# Patient Record
Sex: Female | Born: 1935 | Race: White | Hispanic: No | Smoking: Former smoker
Health system: Southern US, Community
[De-identification: ages and names within clinical notes are randomized; demographics above are authoritative.]

## PROBLEM LIST (undated history)

## (undated) DIAGNOSIS — I10 Essential (primary) hypertension: Secondary | ICD-10-CM

## (undated) DIAGNOSIS — E785 Hyperlipidemia, unspecified: Secondary | ICD-10-CM

## (undated) HISTORY — PX: BACK SURGERY: SHX140

## (undated) HISTORY — PX: COLON SURGERY: SHX602

---

## 2014-10-13 ENCOUNTER — Encounter: Payer: Self-pay | Admitting: *Deleted

## 2014-10-13 ENCOUNTER — Emergency Department: Payer: Medicare Other

## 2014-10-13 ENCOUNTER — Inpatient Hospital Stay: Payer: Medicare Other

## 2014-10-13 ENCOUNTER — Inpatient Hospital Stay
Admission: EM | Admit: 2014-10-13 | Discharge: 2014-10-15 | DRG: 419 | Disposition: A | Discharge status: Home / Self Care | Payer: Medicare Other | Attending: Surgery | Admitting: Surgery

## 2014-10-13 DIAGNOSIS — K8020 Calculus of gallbladder without cholecystitis without obstruction: Principal | ICD-10-CM | POA: Diagnosis present

## 2014-10-13 DIAGNOSIS — Z9889 Other specified postprocedural states: Secondary | ICD-10-CM | POA: Diagnosis not present

## 2014-10-13 DIAGNOSIS — E785 Hyperlipidemia, unspecified: Secondary | ICD-10-CM | POA: Diagnosis present

## 2014-10-13 DIAGNOSIS — K439 Ventral hernia without obstruction or gangrene: Secondary | ICD-10-CM | POA: Diagnosis present

## 2014-10-13 DIAGNOSIS — R1010 Upper abdominal pain, unspecified: Secondary | ICD-10-CM

## 2014-10-13 DIAGNOSIS — Z808 Family history of malignant neoplasm of other organs or systems: Secondary | ICD-10-CM | POA: Diagnosis not present

## 2014-10-13 DIAGNOSIS — R112 Nausea with vomiting, unspecified: Secondary | ICD-10-CM

## 2014-10-13 DIAGNOSIS — Z820 Family history of epilepsy and other diseases of the nervous system: Secondary | ICD-10-CM | POA: Diagnosis not present

## 2014-10-13 DIAGNOSIS — Z87891 Personal history of nicotine dependence: Secondary | ICD-10-CM

## 2014-10-13 DIAGNOSIS — K8063 Calculus of gallbladder and bile duct with acute cholecystitis with obstruction: Secondary | ICD-10-CM

## 2014-10-13 DIAGNOSIS — R52 Pain, unspecified: Secondary | ICD-10-CM

## 2014-10-13 DIAGNOSIS — I10 Essential (primary) hypertension: Secondary | ICD-10-CM | POA: Diagnosis present

## 2014-10-13 DIAGNOSIS — K8040 Calculus of bile duct with cholecystitis, unspecified, without obstruction: Secondary | ICD-10-CM

## 2014-10-13 HISTORY — DX: Essential (primary) hypertension: I10

## 2014-10-13 LAB — CBC WITH DIFFERENTIAL/PLATELET
BASOS ABS: 0 10*3/uL (ref 0–0.1)
Basophils Relative: 0 %
EOS PCT: 3 %
Eosinophils Absolute: 0.3 10*3/uL (ref 0–0.7)
HCT: 47.5 % — ABNORMAL HIGH (ref 35.0–47.0)
Hemoglobin: 15.8 g/dL (ref 12.0–16.0)
LYMPHS ABS: 1.8 10*3/uL (ref 1.0–3.6)
LYMPHS PCT: 15 %
MCH: 31 pg (ref 26.0–34.0)
MCHC: 33.3 g/dL (ref 32.0–36.0)
MCV: 92.9 fL (ref 80.0–100.0)
Monocytes Absolute: 0.8 10*3/uL (ref 0.2–0.9)
Monocytes Relative: 7 %
NEUTROS ABS: 9.2 10*3/uL — AB (ref 1.4–6.5)
Neutrophils Relative %: 75 %
PLATELETS: 194 10*3/uL (ref 150–440)
RBC: 5.11 MIL/uL (ref 3.80–5.20)
RDW: 13 % (ref 11.5–14.5)
WBC: 12.1 10*3/uL — AB (ref 3.6–11.0)

## 2014-10-13 LAB — URINALYSIS COMPLETE WITH MICROSCOPIC (ARMC ONLY)
BILIRUBIN URINE: NEGATIVE
GLUCOSE, UA: NEGATIVE mg/dL
Hgb urine dipstick: NEGATIVE
KETONES UR: NEGATIVE mg/dL
Nitrite: NEGATIVE
Protein, ur: NEGATIVE mg/dL
SPECIFIC GRAVITY, URINE: 1.017 (ref 1.005–1.030)
pH: 6 (ref 5.0–8.0)

## 2014-10-13 LAB — COMPREHENSIVE METABOLIC PANEL
ALT: 12 U/L — AB (ref 14–54)
AST: 25 U/L (ref 15–41)
Albumin: 3.8 g/dL (ref 3.5–5.0)
Alkaline Phosphatase: 70 U/L (ref 38–126)
Anion gap: 12 (ref 5–15)
BUN: 24 mg/dL — ABNORMAL HIGH (ref 6–20)
CO2: 27 mmol/L (ref 22–32)
Calcium: 9.6 mg/dL (ref 8.9–10.3)
Chloride: 100 mmol/L — ABNORMAL LOW (ref 101–111)
Creatinine, Ser: 1.01 mg/dL — ABNORMAL HIGH (ref 0.44–1.00)
GFR calc Af Amer: >60 mL/min (ref >60)
GFR calc non Af Amer: 52 mL/min — ABNORMAL LOW (ref >60)
Glucose, Bld: 119 mg/dL — ABNORMAL HIGH (ref 65–99)
Potassium: 4.3 mmol/L (ref 3.5–5.1)
Sodium: 139 mmol/L (ref 135–145)
Total Bilirubin: 0.8 mg/dL (ref 0.3–1.2)
Total Protein: 8 g/dL (ref 6.5–8.1)

## 2014-10-13 LAB — TROPONIN I: Troponin I: <0.03 ng/mL (ref <0.031)

## 2014-10-13 LAB — LIPASE, BLOOD: LIPASE: 65 U/L — AB (ref 22–51)

## 2014-10-13 MED ORDER — ONDANSETRON HCL 4 MG/2ML IJ SOLN
INTRAMUSCULAR | Status: AC
  Filled 2014-10-13: qty 2

## 2014-10-13 MED ORDER — ACETAMINOPHEN 325 MG PO TABS: 650.0000 mg | ORAL_TABLET | Freq: Four times a day (QID) | ORAL | Status: DC | PRN

## 2014-10-13 MED ORDER — RAMIPRIL 10 MG PO CAPS
10.0000 mg | ORAL_CAPSULE | Freq: Every day | ORAL | Status: DC
  Administered 2014-10-13 – 2014-10-15 (×3): 10 mg via ORAL
  Filled 2014-10-13 (×4): qty 1

## 2014-10-13 MED ORDER — MORPHINE SULFATE 4 MG/ML IJ SOLN
INTRAMUSCULAR | Status: AC
  Filled 2014-10-13: qty 1

## 2014-10-13 MED ORDER — DIPHENHYDRAMINE HCL 50 MG/ML IJ SOLN: 12.5000 mg | Freq: Four times a day (QID) | INTRAMUSCULAR | Status: DC | PRN

## 2014-10-13 MED ORDER — FAMOTIDINE IN NACL 20-0.9 MG/50ML-% IV SOLN
20.0000 mg | Freq: Two times a day (BID) | INTRAVENOUS | Status: DC
  Administered 2014-10-13 – 2014-10-15 (×6): 20 mg via INTRAVENOUS
  Filled 2014-10-13 (×8): qty 50

## 2014-10-13 MED ORDER — MORPHINE SULFATE 4 MG/ML IJ SOLN
4.0000 mg | Freq: Once | INTRAMUSCULAR | Status: DC
  Administered 2014-10-13: 4 mg via INTRAVENOUS

## 2014-10-13 MED ORDER — MORPHINE SULFATE 2 MG/ML IJ SOLN
2.0000 mg | INTRAMUSCULAR | Status: DC | PRN
  Administered 2014-10-13 (×2): 2 mg via INTRAVENOUS
  Administered 2014-10-14: 4 mg via INTRAVENOUS
  Administered 2014-10-14: 2 mg via INTRAVENOUS
  Filled 2014-10-13 (×3): qty 1
  Filled 2014-10-13: qty 2

## 2014-10-13 MED ORDER — KCL IN DEXTROSE-NACL 20-5-0.45 MEQ/L-%-% IV SOLN
INTRAVENOUS | Status: AC
  Filled 2014-10-13: qty 1000

## 2014-10-13 MED ORDER — FAMOTIDINE IN NACL 20-0.9 MG/50ML-% IV SOLN
INTRAVENOUS | Status: AC
  Administered 2014-10-13: 20 mg via INTRAVENOUS
  Filled 2014-10-13: qty 50

## 2014-10-13 MED ORDER — ONDANSETRON HCL 4 MG/2ML IJ SOLN
4.0000 mg | Freq: Once | INTRAMUSCULAR | Status: DC
  Administered 2014-10-13: 4 mg via INTRAVENOUS

## 2014-10-13 MED ORDER — ONDANSETRON HCL 4 MG/2ML IJ SOLN
4.0000 mg | INTRAMUSCULAR | Status: DC | PRN
  Administered 2014-10-13: 4 mg via INTRAVENOUS
  Filled 2014-10-13: qty 2

## 2014-10-13 MED ORDER — GADOBENATE DIMEGLUMINE 529 MG/ML IV SOLN
20.0000 mL | Freq: Once | INTRAVENOUS | Status: AC | PRN
  Administered 2014-10-13: 16 mL via INTRAVENOUS

## 2014-10-13 MED ORDER — ACETAMINOPHEN 650 MG RE SUPP: 650.0000 mg | Freq: Four times a day (QID) | RECTAL | Status: DC | PRN

## 2014-10-13 MED ORDER — PROMETHAZINE HCL 25 MG PO TABS: 25.0000 mg | ORAL_TABLET | Freq: Four times a day (QID) | ORAL | Status: DC | PRN

## 2014-10-13 MED ORDER — ONDANSETRON HCL 4 MG/2ML IJ SOLN
INTRAMUSCULAR | Status: AC
  Administered 2014-10-13: 4 mg via INTRAVENOUS
  Filled 2014-10-13: qty 2

## 2014-10-13 MED ORDER — KCL IN DEXTROSE-NACL 20-5-0.45 MEQ/L-%-% IV SOLN
INTRAVENOUS | Status: DC
  Administered 2014-10-14 (×2): via INTRAVENOUS
  Filled 2014-10-13 (×11): qty 1000

## 2014-10-13 MED ORDER — SODIUM CHLORIDE 0.9 % IV BOLUS (SEPSIS)
1000.0000 mL | Freq: Once | INTRAVENOUS | Status: AC
  Administered 2014-10-13: 1000 mL via INTRAVENOUS

## 2014-10-13 MED ORDER — DIPHENHYDRAMINE HCL 12.5 MG/5ML PO ELIX: 12.5000 mg | ORAL_SOLUTION | Freq: Four times a day (QID) | ORAL | Status: DC | PRN

## 2014-10-13 MED ORDER — ONDANSETRON HCL 4 MG/2ML IJ SOLN
4.0000 mg | Freq: Once | INTRAMUSCULAR | Status: AC
  Administered 2014-10-13: 4 mg via INTRAVENOUS

## 2014-10-13 MED ORDER — PROMETHAZINE HCL 25 MG/ML IJ SOLN: 25.0000 mg | Freq: Four times a day (QID) | INTRAMUSCULAR | Status: DC | PRN

## 2014-10-13 NOTE — H&P (Signed)
Patient ID: Angela Leonard, female   DOB: 03-31-1935, 80 y.o.   MRN: [id number]  History of Present Illness Angela Leonard is a 80 y.o. female with a 12 hour history of intermittent epigastric pain, each episode lasting a few minutes, and then relieved for 5 or 10 minutes. This was associated with vomiting. This is the patient's first episode of such attacks. She denies fever, chills, change in the color of her urine, stool, eyes, or skin.  Past Medical History History reviewed. No pertinent past medical history.   Hypertension  Past Surgical History  Procedure Laterality Date  . Back surgery      Not on File  Current Facility-Administered Medications  Medication Dose Route Frequency Provider Last Rate Last Dose  . acetaminophen (TYLENOL) tablet 650 mg  650 mg Oral Q6H PRN Molly Maduro, MD       Or  . acetaminophen (TYLENOL) suppository 650 mg  650 mg Rectal Q6H PRN Molly Maduro, MD      . dextrose 5 % and 0.45 % NaCl with KCl 20 mEq/L infusion   Intravenous Continuous Molly Maduro, MD      . diphenhydrAMINE (BENADRYL) injection 12.5 mg  12.5 mg Intravenous Q6H PRN Molly Maduro, MD       Or  . diphenhydrAMINE (BENADRYL) 12.5 MG/5ML elixir 12.5 mg  12.5 mg Oral Q6H PRN Molly Maduro, MD      . famotidine (PEPCID) IVPB 20 mg premix  20 mg Intravenous Q12H Molly Maduro, MD      . morphine 2 MG/ML injection 2-5 mg  2-5 mg Intravenous Q2H PRN Molly Maduro, MD      . morphine 4 MG/ML injection           . ondansetron (ZOFRAN) 4 MG/2ML injection           . promethazine (PHENERGAN) tablet 25 mg  25 mg Oral Q6H PRN Molly Maduro, MD       Or  . promethazine (PHENERGAN) injection 25 mg  25 mg Intravenous Q6H PRN Molly Maduro, MD      . ramipril (ALTACE) capsule 10 mg  10 mg Oral Daily Molly Maduro, MD       Current Outpatient Prescriptions  Medication Sig Dispense Refill  . ramipril (ALTACE) 10 MG capsule Take 10 mg by mouth daily.      Family  History History reviewed. No pertinent family history.   The patient's father died of melanoma at age 70. Her mother died of combination of Alzheimer's dementia and Parkinson's disease at age 44.  Social History History  Substance Use Topics  . Smoking status: Former Research scientist (life sciences)  . Smokeless tobacco: Not on file  . Alcohol Use: No     Quit smoking 5 years ago. Prior to that she was smoking 1 pack of cigarettes per day. She is widowed and lives with her daughter. 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  Review of Systems A 10 point review of systems was asked and was negative except for the following positive findings: Epigastric pain and vomiting.  Physical Exam Blood pressure 113/66, pulse 88, temperature 98.2 F (36.8 C), temperature source Oral, resp. rate 17, height 5' 3.25" (1.607 m), weight 78.472 kg (173 lb), SpO2 95 %.  CONSTITUTIONAL: Pleasant elderly white female lying on the emergency room stretcher without any distress. EYES: Pupils equal, round, and reactive to light, Sclera non-icteric. EARS, NOSE, MOUTH AND THROAT: The oropharynx is clear. Oral mucosa is pink and moist.  Hearing is intact to voice.  NECK: Trachea is midline, and there is no jugular venous distension. Thyroid is without palpable abnormalities. LYMPH NODES:  Lymph nodes in the neck are not enlarged. RESPIRATORY:  Lungs are clear, and breath sounds are equal bilaterally. Normal respiratory effort without pathologic use of accessory muscles. CARDIOVASCULAR: Heart is regular without murmurs, gallops, or rubs. GI: The abdomen is soft, nontender, and nondistended. There were no palpable masses. There was no hepatosplenomegaly. There were normal bowel sounds. GU: Deferred. MUSCULOSKELETAL:  Normal muscle strength and tone in all four extremities.    SKIN: Skin turgor is normal. There are no pathologic skin lesions.  NEUROLOGIC:  Motor and sensation is grossly normal.  Cranial nerves  are grossly intact. PSYCH:  Alert and oriented to person, place and time. Affect is normal.  Data Reviewed History from the electronic health record, all laboratory values, ultrasound. I have personally reviewed the patient's imaging and medical records.    Assessment    Symptomatic cholelithiasis versus early acute cholecystitis in a otherwise healthy elderly patient with a 10 mm common bile duct, and normal hepatic transaminases and bilirubin.  Plan    Admit to hospital with IV fluid hydration. MRCP to rule out choledocholithiasis. No antibiotics at this time, but monitor white blood cell count and vital signs and symptoms for possible early acute cholecystitis.  Face-to-face time spent with the patient and care providers was 50 minutes, with more than 50% of the time spent counseling, educating, and coordinating care of the patient.     Consuela Mimes 10/13/2014, 5:56 AM

## 2014-10-13 NOTE — ED Notes (Signed)
Dr. Beather Arbour returns to bedside to speak with patient regarding results and plan of care as it stands at this point.

## 2014-10-13 NOTE — ED Notes (Signed)
Patient reporting pain now. MD ok with giving previously ordered Morphine and Zofran that was previously ordered. Medications given per order.

## 2014-10-13 NOTE — Progress Notes (Signed)
Surgery Progress Note  S: Still c/o pain, nausea, MRCP without CBD stones O: AF/VSS, good uop GEN: NAD/A&Ox3 ABD: soft, mild tender, nondistended  A/P 80 yo with likely symptomatic cholelithiasis - lap chole tomorrow

## 2014-10-13 NOTE — ED Notes (Signed)
Pt arrived via EMS from home reporting onset of NV yesterday evening at 1700. Pt reports nausea beginning after eating at [employer]. Pt denies diarrhea at thsi time but reports having a bowel movement last night. vomitting x 3 since 1700. Pt reports abd pain, denies pain upon palpation.

## 2014-10-13 NOTE — ED Notes (Signed)
Dr. Sharlyne Cai in to see patient for surgical consult per request from Dr. Beather Arbour.

## 2014-10-13 NOTE — ED Notes (Signed)
Patient returned from [place] at this time. RN to bedside. Patient advising that she is not having any nausea or pain at this time. Active orders in for Zofran and Morphine - patient does not want medications at this time. Orders left active in the event that they are needed in the future. MD aware.

## 2014-10-13 NOTE — ED Provider Notes (Signed)
[facility] Emergency Department Provider Note  ____________________________________________  Time seen: Approximately 3:07 AM  I have reviewed the triage vital signs and the nursing notes.   HISTORY  Chief Complaint Nausea and Emesis   HPI Angela Leonard is a 80 y.o. female who presents via EMS for nausea, vomiting and abdominal pain. Patient complains of 8/10 nonradiating, crampy upper abdominal pain, onset approximately 5 PM. Patient reports she ate [employer] around 2:30 PM. Patient reports 3-4 episodes of emesis. Denies diarrhea. Reports having a normal bowel movement prior to arrival.Patient denies fever, chills, chest pain, shortness of breath, dysuria, numbness, tingling, weakness. Patient denies recent travel history.   Past Medical History Hypertension Hyperlipidemia Diverticulitis   Past Surgical History  Procedure Laterality Date  . Back surgery    Colectomy  Current Outpatient Rx  Name  Route  Sig  Dispense  Refill  . ramipril (ALTACE) 10 MG capsule   Oral   Take 10 mg by mouth daily.           Allergies Review of patient's allergies indicates not on file.  History reviewed. No pertinent family history.  Social History History  Substance Use Topics  . Smoking status: Former Research scientist (life sciences)  . Smokeless tobacco: Not on file  . Alcohol Use: No    Review of Systems Constitutional: No fever/chills Eyes: No visual changes. ENT: No sore throat. Cardiovascular: Denies chest pain. Respiratory: Denies shortness of breath. Gastrointestinal: Positive for abdominal pain. Positive for nausea and vomiting.  No diarrhea.  No constipation. Genitourinary: Negative for dysuria. Musculoskeletal: Negative for back pain. Skin: Negative for rash. Neurological: Negative for headaches, focal weakness or numbness.  10-point ROS otherwise negative.  ____________________________________________   PHYSICAL EXAM:  VITAL SIGNS: ED Triage Vitals   Enc Vitals Group     BP 10/13/14 0208 113/66 mmHg     Pulse Rate 10/13/14 0208 88     Resp 10/13/14 0208 17     Temp 10/13/14 0208 98.2 F (36.8 C)     Temp Source 10/13/14 0208 Oral     SpO2 10/13/14 0208 95 %     Weight 10/13/14 0208 173 lb (78.472 kg)     Height 10/13/14 0208 5' 3.25" (1.607 m)     Head Cir --      Peak Flow --      Pain Score 10/13/14 0209 0     Pain Loc --      Pain Edu? --      Excl. in [place]? --     Constitutional: Alert and oriented. Well appearing and in mild acute distress. Eyes: Conjunctivae are normal. PERRL. EOMI. Head: Atraumatic. Nose: No congestion/rhinnorhea. Mouth/Throat: Mucous membranes are moist.  Oropharynx non-erythematous. Neck: No stridor.   Cardiovascular: Normal rate, regular rhythm. Grossly normal heart sounds.  Good peripheral circulation. Respiratory: Normal respiratory effort.  No retractions. Lungs CTAB. Gastrointestinal: Soft, tender to palpation epigastrium without rebound or guarding. No distention. No abdominal bruits. No CVA tenderness. Musculoskeletal: No lower extremity tenderness nor edema.  No joint effusions. Neurologic:  Normal speech and language. No gross focal neurologic deficits are appreciated. Speech is normal. No gait instability. Skin:  Skin is warm, dry and intact. No rash noted. Psychiatric: Mood and affect are normal. Speech and behavior are normal.  ____________________________________________   LABS (all labs ordered are listed, but only abnormal results are displayed)  Labs Reviewed  CBC WITH DIFFERENTIAL/PLATELET - Abnormal; Notable for the following:    WBC 12.1 (*)  HCT 47.5 (*)    Neutro Abs 9.2 (*)    All other components within normal limits  COMPREHENSIVE METABOLIC PANEL - Abnormal; Notable for the following:    Chloride 100 (*)    Glucose, Bld 119 (*)    BUN 24 (*)    Creatinine, Ser 1.01 (*)    ALT 12 (*)    GFR calc non Af Amer 52 (*)    All other components within normal limits   LIPASE, BLOOD - Abnormal; Notable for the following:    Lipase 65 (*)    All other components within normal limits  URINALYSIS COMPLETEWITH MICROSCOPIC ([facility])  - Abnormal; Notable for the following:    Color, Urine YELLOW (*)    APPearance HAZY (*)    Leukocytes, UA 1+ (*)    Bacteria, UA FEW (*)    Squamous Epithelial / LPF 6-30 (*)    All other components within normal limits  TROPONIN I   ____________________________________________  EKG  ED ECG REPORT   Date: 10/13/2014  EKG Time: 0205  Rate: 88  Rhythm: normal EKG, normal sinus rhythm  Axis: Normal  Intervals:first-degree A-V block   ST&T Change: Nonspecific  ____________________________________________  RADIOLOGY  Ultrasound abdomen limited right upper quadrant interpreted by Dr. Marisue Humble: Cholelithiasis with borderline gallbladder wall thickening. Sonographic Murphy sign is positive. Findings are concerning for acute cholecystitis. In addition there is dilatation of the common bile duct measuring 10 mm, raising possibility of choledocholithiasis. ____________________________________________   PROCEDURES  Procedure(s) performed: None  Critical Care performed: No  ____________________________________________   INITIAL IMPRESSION / ASSESSMENT AND PLAN / ED COURSE  Pertinent labs & imaging results that were available during my care of the patient were reviewed by me and considered in my medical decision making (see chart for details).  80 year old female who presents with upper abdominal pain, nausea and vomiting; onset after eating Angela Leonard. Already feeling better after IV Zofran. We'll continue IV fluid resuscitation, IV analgesia, will obtain ultrasound to evaluate biliary tree.  ----------------------------------------- 5:06 AM on 10/13/2014 -----------------------------------------  Discussed case with Dr. Leanora Cover (general surgery) who will evaluate patient in the  ED. ____________________________________________   FINAL CLINICAL IMPRESSION(S) / ED DIAGNOSES  Final diagnoses:  Pain  Upper abdominal pain  Non-intractable vomiting with nausea, vomiting of unspecified type  Calculus of gallbladder and bile duct with acute cholecystitis, with obstruction      Paulette Blanch, MD 10/13/14 [phone]

## 2014-10-14 ENCOUNTER — Inpatient Hospital Stay: Payer: Medicare Other | Admitting: *Deleted

## 2014-10-14 ENCOUNTER — Encounter
Admission: EM | Disposition: A | Discharge status: Home / Self Care | Payer: Self-pay | Source: Home / Self Care | Attending: Surgery

## 2014-10-14 HISTORY — PX: CHOLECYSTECTOMY: SHX55

## 2014-10-14 LAB — CBC
HCT: 43.4 % (ref 35.0–47.0)
Hemoglobin: 14.2 g/dL (ref 12.0–16.0)
MCH: 30.9 pg (ref 26.0–34.0)
MCHC: 32.8 g/dL (ref 32.0–36.0)
MCV: 94.5 fL (ref 80.0–100.0)
PLATELETS: 181 10*3/uL (ref 150–440)
RBC: 4.6 MIL/uL (ref 3.80–5.20)
RDW: 12.9 % (ref 11.5–14.5)
WBC: 10.4 10*3/uL (ref 3.6–11.0)

## 2014-10-14 LAB — COMPREHENSIVE METABOLIC PANEL
ALT: 10 U/L — ABNORMAL LOW (ref 14–54)
AST: 16 U/L (ref 15–41)
Albumin: 3 g/dL — ABNORMAL LOW (ref 3.5–5.0)
Alkaline Phosphatase: 56 U/L (ref 38–126)
Anion gap: 3 — ABNORMAL LOW (ref 5–15)
BILIRUBIN TOTAL: 0.4 mg/dL (ref 0.3–1.2)
BUN: 14 mg/dL (ref 6–20)
CALCIUM: 8.8 mg/dL — AB (ref 8.9–10.3)
CHLORIDE: 106 mmol/L (ref 101–111)
CO2: 30 mmol/L (ref 22–32)
Creatinine, Ser: 0.89 mg/dL (ref 0.44–1.00)
GFR calc Af Amer: >60 mL/min (ref >60)
GLUCOSE: 94 mg/dL (ref 65–99)
Potassium: 4.2 mmol/L (ref 3.5–5.1)
Sodium: 139 mmol/L (ref 135–145)
Total Protein: 6.3 g/dL — ABNORMAL LOW (ref 6.5–8.1)

## 2014-10-14 LAB — APTT: aPTT: 35 seconds (ref 24–36)

## 2014-10-14 LAB — PROTIME-INR
INR: 1.01
Prothrombin Time: 13.5 seconds (ref 11.4–15.0)

## 2014-10-14 LAB — PHOSPHORUS: Phosphorus: 2.9 mg/dL (ref 2.5–4.6)

## 2014-10-14 LAB — MAGNESIUM: MAGNESIUM: 1.6 mg/dL — AB (ref 1.7–2.4)

## 2014-10-14 SURGERY — LAPAROSCOPIC CHOLECYSTECTOMY
Anesthesia: General | Wound class: Clean Contaminated

## 2014-10-14 MED ORDER — HYDROMORPHONE HCL 1 MG/ML IJ SOLN
INTRAMUSCULAR | Status: AC
  Filled 2014-10-14: qty 1

## 2014-10-14 MED ORDER — HYDROCODONE-ACETAMINOPHEN 5-325 MG PO TABS
1.0000 | ORAL_TABLET | Freq: Four times a day (QID) | ORAL | Status: DC | PRN
  Administered 2014-10-15: 1 via ORAL
  Filled 2014-10-14: qty 1

## 2014-10-14 MED ORDER — BUPIVACAINE-EPINEPHRINE (PF) 0.25% -1:200000 IJ SOLN
INTRAMUSCULAR | Status: AC
  Filled 2014-10-14: qty 30

## 2014-10-14 MED ORDER — BUPIVACAINE-EPINEPHRINE 0.25% -1:200000 IJ SOLN
INTRAMUSCULAR | Status: DC | PRN
  Administered 2014-10-14: 6 mL

## 2014-10-14 MED ORDER — SODIUM CHLORIDE 0.9 % IR SOLN
Status: DC | PRN
  Administered 2014-10-14: 700 mL

## 2014-10-14 MED ORDER — DEXAMETHASONE SODIUM PHOSPHATE 4 MG/ML IJ SOLN
8.0000 mg | Freq: Once | INTRAMUSCULAR | Status: DC | PRN
Start: 2014-10-14 — End: 2014-10-14
  Filled 2014-10-14: qty 2

## 2014-10-14 MED ORDER — GLYCOPYRROLATE 0.2 MG/ML IJ SOLN
INTRAMUSCULAR | Status: DC | PRN
  Administered 2014-10-14: 0.4 mg via INTRAVENOUS

## 2014-10-14 MED ORDER — PROPOFOL 10 MG/ML IV BOLUS
INTRAVENOUS | Status: DC | PRN
  Administered 2014-10-14: 120 mg via INTRAVENOUS

## 2014-10-14 MED ORDER — LACTATED RINGERS IV SOLN
INTRAVENOUS | Status: DC | PRN
  Administered 2014-10-14: 10:00:00 via INTRAVENOUS

## 2014-10-14 MED ORDER — DEXAMETHASONE SODIUM PHOSPHATE 4 MG/ML IJ SOLN
INTRAMUSCULAR | Status: DC | PRN
  Administered 2014-10-14: 5 mg via INTRAVENOUS

## 2014-10-14 MED ORDER — LIDOCAINE HCL (CARDIAC) 20 MG/ML IV SOLN
INTRAVENOUS | Status: DC | PRN
  Administered 2014-10-14: 80 mg via INTRAVENOUS

## 2014-10-14 MED ORDER — LIDOCAINE HCL (PF) 1 % IJ SOLN
INTRAMUSCULAR | Status: AC
  Filled 2014-10-14: qty 30

## 2014-10-14 MED ORDER — CEFAZOLIN SODIUM-DEXTROSE 2-3 GM-% IV SOLR
INTRAVENOUS | Status: DC | PRN
  Administered 2014-10-14: 2 g via INTRAVENOUS

## 2014-10-14 MED ORDER — FENTANYL CITRATE (PF) 100 MCG/2ML IJ SOLN
INTRAMUSCULAR | Status: DC | PRN
  Administered 2014-10-14 (×2): 50 ug via INTRAVENOUS

## 2014-10-14 MED ORDER — LIDOCAINE HCL 1 % IJ SOLN
INTRAMUSCULAR | Status: DC | PRN
  Administered 2014-10-14: 6 mL via INTRADERMAL

## 2014-10-14 MED ORDER — ROCURONIUM BROMIDE 100 MG/10ML IV SOLN
INTRAVENOUS | Status: DC | PRN
  Administered 2014-10-14: 30 mg via INTRAVENOUS

## 2014-10-14 MED ORDER — NEOSTIGMINE METHYLSULFATE 10 MG/10ML IV SOLN
INTRAVENOUS | Status: DC | PRN
  Administered 2014-10-14: 3 mg via INTRAVENOUS

## 2014-10-14 MED ORDER — HYDROMORPHONE HCL 1 MG/ML IJ SOLN
0.2500 mg | INTRAMUSCULAR | Status: DC | PRN
  Administered 2014-10-14 (×2): 0.5 mg via INTRAVENOUS

## 2014-10-14 MED ORDER — MIDAZOLAM HCL 2 MG/2ML IJ SOLN
INTRAMUSCULAR | Status: DC | PRN
  Administered 2014-10-14: 2 mg via INTRAVENOUS

## 2014-10-14 MED ORDER — ONDANSETRON HCL 4 MG/2ML IJ SOLN
INTRAMUSCULAR | Status: DC | PRN
  Administered 2014-10-14: 4 mg via INTRAVENOUS

## 2014-10-14 SURGICAL SUPPLY — 50 items
APPLIER CLIP ROT 10 11.4 M/L (STAPLE) ×2
BAG COUNTER SPONGE EZ (MISCELLANEOUS) IMPLANT
BENZOIN TINCTURE PRP APPL 2/3 (GAUZE/BANDAGES/DRESSINGS) ×2 IMPLANT
BLADE SURG SZ11 CARB STEEL (BLADE) ×2 IMPLANT
BULB RESERV EVAC DRAIN JP 100C (MISCELLANEOUS) IMPLANT
CANISTER SUCT 1200ML W/VALVE (MISCELLANEOUS) ×2 IMPLANT
CATH REDDICK CHOLANGI 4FR 50CM (CATHETERS) IMPLANT
CHLORAPREP W/TINT 26ML (MISCELLANEOUS) ×2 IMPLANT
CLIP APPLIE ROT 10 11.4 M/L (STAPLE) ×1 IMPLANT
CONRAY 60ML FOR OR (MISCELLANEOUS) IMPLANT
DECANTER SPIKE VIAL GLASS SM (MISCELLANEOUS) ×2 IMPLANT
DISSECTOR KITTNER STICK (MISCELLANEOUS) IMPLANT
DISSECTORS/KITTNER STICK (MISCELLANEOUS)
DRAIN CHANNEL JP 19F (MISCELLANEOUS) IMPLANT
DRAPE SHEET LG 3/4 BI-LAMINATE (DRAPES) IMPLANT
DRSG TEGADERM 2-3/8X2-3/4 SM (GAUZE/BANDAGES/DRESSINGS) ×8 IMPLANT
DRSG TELFA 3X8 NADH (GAUZE/BANDAGES/DRESSINGS) ×2 IMPLANT
ENDOLOOP SUT PDS II  0 18 (SUTURE)
ENDOLOOP SUT PDS II 0 18 (SUTURE) IMPLANT
ENDOPOUCH RETRIEVER 10 (MISCELLANEOUS) ×2 IMPLANT
GLOVE BIO SURGEON STRL SZ7.5 (GLOVE) ×10 IMPLANT
GOWN STRL REUS W/ TWL LRG LVL3 (GOWN DISPOSABLE) ×3 IMPLANT
GOWN STRL REUS W/TWL LRG LVL3 (GOWN DISPOSABLE) ×3
IRRIGATION STRYKERFLOW (MISCELLANEOUS) ×1 IMPLANT
IRRIGATOR STRYKERFLOW (MISCELLANEOUS) ×2
IV CATH ANGIO 12GX3 LT BLUE (NEEDLE) IMPLANT
IV NS 1000ML (IV SOLUTION) ×1
IV NS 1000ML BAXH (IV SOLUTION) ×1 IMPLANT
LABEL OR SOLS (LABEL) IMPLANT
LIQUID BAND (GAUZE/BANDAGES/DRESSINGS) IMPLANT
NDL SAFETY 25GX1.5 (NEEDLE) ×2 IMPLANT
NS IRRIG 500ML POUR BTL (IV SOLUTION) ×2 IMPLANT
PACK LAP CHOLECYSTECTOMY (MISCELLANEOUS) ×2 IMPLANT
PAD GROUND ADULT SPLIT (MISCELLANEOUS) ×2 IMPLANT
PENCIL ELECTRO HAND CTR (MISCELLANEOUS) ×2 IMPLANT
SCISSORS METZENBAUM CVD 33 (INSTRUMENTS) ×2 IMPLANT
SEAL FOR SCOPE WARMER C3101 (MISCELLANEOUS) IMPLANT
SLEEVE ENDOPATH XCEL 5M (ENDOMECHANICALS) ×2 IMPLANT
STRAP SAFETY BODY (MISCELLANEOUS) ×2 IMPLANT
STRIP CLOSURE SKIN 1/2X4 (GAUZE/BANDAGES/DRESSINGS) ×2 IMPLANT
SUT MNCRL 4-0 (SUTURE) ×1
SUT MNCRL 4-0 27XMFL (SUTURE) ×1
SUT VICRYL 0 AB UR-6 (SUTURE) ×6 IMPLANT
SUTURE MNCRL 4-0 27XMF (SUTURE) ×1 IMPLANT
SWABSTK COMLB BENZOIN TINCTURE (MISCELLANEOUS) ×2 IMPLANT
TROCAR XCEL BLUNT TIP 100MML (ENDOMECHANICALS) ×2 IMPLANT
TROCAR XCEL NON-BLD 11X100MML (ENDOMECHANICALS) ×2 IMPLANT
TROCAR XCEL NON-BLD 5MMX100MML (ENDOMECHANICALS) ×2 IMPLANT
TUBING INSUFFLATOR HI FLOW (MISCELLANEOUS) ×2 IMPLANT
WATER STERILE IRR 1000ML POUR (IV SOLUTION) ×2 IMPLANT

## 2014-10-14 NOTE — Anesthesia Procedure Notes (Signed)
Procedure Name: Intubation Date/Time: 10/14/2014 9:51 AM Performed by: Silvana Newness Pre-anesthesia Checklist: Patient identified, Emergency Drugs available, Suction available, Patient being monitored and Timeout performed Patient Re-evaluated:Patient Re-evaluated prior to inductionOxygen Delivery Method: Circle system utilized Preoxygenation: Pre-oxygenation with 100% oxygen Intubation Type: IV induction Ventilation: Mask ventilation without difficulty Laryngoscope Size: Mac and 3 Grade View: Grade I Tube type: Oral Tube size: 7.0 mm Number of attempts: 1 Airway Equipment and Method: Stylet Placement Confirmation: ETT inserted through vocal cords under direct vision,  positive ETCO2 and breath sounds checked- equal and bilateral Secured at: 18 cm Tube secured with: Tape Dental Injury: Teeth and Oropharynx as per pre-operative assessment

## 2014-10-14 NOTE — Anesthesia Preprocedure Evaluation (Signed)
Anesthesia Evaluation  Patient identified by MRN, date of birth, ID band Patient awake    Reviewed: Allergy & Precautions, NPO status , Patient's Chart, lab work & pertinent test results  Airway Mallampati: I  TM Distance: >3 FB Neck ROM: Limited    Dental  (+) Edentulous Upper, Edentulous Lower   Pulmonary former smoker,  breath sounds clear to auscultation        Cardiovascular Exercise Tolerance: Poor hypertension, Pt. on medications Rhythm:Regular Rate:Normal  First degree heart block on ECG.   Neuro/Psych    GI/Hepatic Acute cholelithiasis.   Endo/Other  negative endocrine ROS  Renal/GU      Musculoskeletal   Abdominal   Peds  Hematology   Anesthesia Other Findings   Reproductive/Obstetrics                             Anesthesia Physical Anesthesia Plan  ASA: III and emergent  Anesthesia Plan: General   Post-op Pain Management:    Induction: Intravenous  Airway Management Planned: Oral ETT  Additional Equipment:   Intra-op Plan:   Post-operative Plan: Extubation in OR  Informed Consent: I have reviewed the patients History and Physical, chart, labs and discussed the procedure including the risks, benefits and alternatives for the proposed anesthesia with the patient or authorized representative who has indicated his/her understanding and acceptance.     Plan Discussed with: CRNA  Anesthesia Plan Comments:         Anesthesia Quick Evaluation

## 2014-10-14 NOTE — Op Note (Signed)
Preop dx: Symptomatic cholelithiasis Postop dx: Same Procedure performed: Laparoscopic cholecystectomy Anesthesia: General EBL: 10 Complications: None Specimen: gallbladder  Indication for surgery: Ms Higashi RUQ pain and gallstones. Her pain thought to be biliary in nature so I offered cholecystectomy.  Details of surgery: Informed consent was obtained.  Ms. Depaolis was brought to the OR suite and laid supine on the OR table.  She was induced, ETT was placed, general anesthesia was administered.  Her abdomen was prepped and draped.  A timeout was performed correctly identifying patient name, operative site and procedure to be performed.  A supraumbilical incision was made and deepened what appeared to be peritoneum.  The peritoneum was entered.  The fascia lateral to this hernia defect was cleared off and all adhesions were taken down bluntly.  Two stay sutures were placed through the fascia.  Hassan trocar was placed and abdomen was insufflated.  An 44m epigastric and 2 5 mm subcostal trocars were placed.  Gallbladder was midly dissected.  Cystic duct and cystic artery were dissected out and critical view was obtained.  Both structures were clipped and ligated.  Gallbladder was then taken off fossa and removed through umbilicus. Fossa was made hemostatic and irrigated until hemostasis obtained.  Trocars were then removed and abdomen desufflated.  Supraumbilical fascia was then closed with previously placed stay sutures.  Skin was then closed with interrupted deep dermal 4-0 monocryl.  Suture strips, telfa and tegaderm were used to dress incision.  Patient was then awoken, extubated and brought to PACU.  There were no immediate complications. Needle, sponge and instrument count was correct at the end of the procedure.

## 2014-10-14 NOTE — Anesthesia Postprocedure Evaluation (Signed)
  Anesthesia Post-op Note  Patient: Angela Leonard  Procedure(s) Performed: Procedure(s): LAPAROSCOPIC CHOLECYSTECTOMY (N/A)  Anesthesia type:General  Patient location: PACU  Post pain: Pain level controlled  Post assessment: Post-op Vital signs reviewed, Patient's Cardiovascular Status Stable, Respiratory Function Stable, Patent Airway and No signs of Nausea or vomiting  Post vital signs: Reviewed and stable  Last Vitals:  Filed Vitals:   10/14/14 1145  BP: 109/60  Pulse: 92  Temp:   Resp: 16    Level of consciousness: awake, alert  and patient cooperative  Complications: No apparent anesthesia complications

## 2014-10-14 NOTE — Brief Op Note (Signed)
10/13/2014 - 10/14/2014  11:11 AM  PATIENT:  Angela Leonard  80 y.o. female    PRE-OPERATIVE DIAGNOSIS:  Symptomatic cholelithiasis  POST-OPERATIVE DIAGNOSIS: Cholecystitis, ventral hernia      PROCEDURE:  Procedure(s): LAPAROSCOPIC CHOLECYSTECTOMY (N/A)  SURGEON:  Surgeon(s) and Role:    * Marlyce Huge, MD - Primary  PHYSICIAN ASSISTANT:   ASSISTANTS: None  ANESTHESIA:   general  EBL:  Total I/O In: 450 [I.V.:450] Out: 10 [Blood:10]  BLOOD ADMINISTERED:none  DRAINS: none   LOCAL MEDICATIONS USED:  LIDOCAINE   SPECIMEN:  Excision  DISPOSITION OF SPECIMEN:  PATHOLOGY  COUNTS:  YES  DICTATION: .Note written in EPIC  PLAN OF CARE: Admit for overnight observation  PATIENT DISPOSITION:  PACU - hemodynamically stable.   Delay start of Pharmacological VTE agent (>24hrs) due to surgical blood loss or risk of bleeding: not applicable

## 2014-10-14 NOTE — Transfer of Care (Signed)
Immediate Anesthesia Transfer of Care Note  Patient: Angela Leonard  Procedure(s) Performed: Procedure(s): LAPAROSCOPIC CHOLECYSTECTOMY (N/A)  Patient Location: PACU  Anesthesia Type:General  Level of Consciousness: awake, alert , oriented and patient cooperative  Airway & Oxygen Therapy: Patient Spontanous Breathing and Patient connected to face mask oxygen  Post-op Assessment: Report given to RN, Post -op Vital signs reviewed and stable and Patient moving all extremities X 4  Post vital signs: Reviewed and stable  Last Vitals:  Filed Vitals:   10/14/14 1100  BP: 130/70  Pulse: 88  Temp: 36.6 C  Resp: 22    Complications: No apparent anesthesia complications

## 2014-10-15 MED ORDER — HYDROCODONE-ACETAMINOPHEN 5-325 MG PO TABS: 1.0000 | ORAL_TABLET | Freq: Four times a day (QID) | ORAL | Status: DC | PRN

## 2014-10-15 NOTE — Discharge Instructions (Signed)
Call or return to ER if you have fever greater than 101.5, nausea/vomiting, redness/drainage from incision, increased pain Do not drive on narcotic pain medications Do not lift greater than 15 pounds for 6 weeks

## 2014-10-15 NOTE — Progress Notes (Signed)
Surgery Progress Note  S: No acute issues, feels much better O: AF/VSS, good uop GEN: NAD/A&Ox3 ABD: soft, min tender, nondistended, dressing c/d/i  A/P 80 yo s/p lap cholecystectomy, doing well - no acute issues - diet as tolerated, PO pain meds - possible home later

## 2014-10-15 NOTE — Progress Notes (Signed)
Pt A&OX4, slightly HOH. IVF"s infusing without  Difficulty. Pt is stand by assist only and ambulates to the bathroom with minimal supervision. Reinforced dressing noted at the umbilicus due to bloody drainage. Call light and phone in reach, Bed exit  alarm placed.

## 2014-10-15 NOTE — Progress Notes (Signed)
Pt alert and oriented. Discharged to home, concerns addressed. No c/o pain or nausea. No emesis. IV site removed. Information given. Pt wheeled down in wheelchair with auxillary.

## 2014-10-17 ENCOUNTER — Encounter: Payer: Self-pay | Admitting: Surgery

## 2014-10-19 LAB — SURGICAL PATHOLOGY

## 2014-10-21 NOTE — Discharge Summary (Signed)
Physician Discharge Summary  Patient ID: Angela Leonard MRN: [id number] DOB/AGE: 1934/07/18 79 y.o.  Admit date: 10/13/2014 Discharge date: 10/21/2014  Admission Diagnoses:  Symptomatic cholelithiasis  Discharge Diagnoses:  Active Problems:   Symptomatic cholelithiasis   Discharged Condition: good  Hospital Course: Angela Leonard was admitted for RUQ pain and gallstones.  She underwent laparoscopic cholecystectomy.  Postoperatively she was began on liquids and given IV pain medications.  Her diet was advanced to regular as tolerated and transitioned from IV to PO pain medications.  At time of discharge, Angela Leonard was tolerating a regular diet and pain was controlled with oral pain medications.2  Consults: None  Significant Diagnostic Studies: Ultrasound, gallstones  Treatments: surgery: Laparoscopic cholecystectomy  Discharge Exam: Blood pressure 133/67, pulse 96, temperature 98.4 F (36.9 C), temperature source Oral, resp. rate 17, height 5' 3.25" (1.607 m), weight 78.472 kg (173 lb), SpO2 100 %. Abdomen: soft, min tender, nondistended, incision c/d/i  Disposition: 01-Home or Self Care     Medication List    TAKE these medications        aspirin EC 81 MG tablet  Take 81 mg by mouth at bedtime.     HYDROcodone-acetaminophen 5-325 MG per tablet  Commonly known as:  NORCO/VICODIN  Take 1-2 tablets by mouth every 6 (six) hours as needed for severe pain.     ramipril 10 MG capsule  Commonly known as:  ALTACE  Take 10 mg by mouth daily.     simvastatin 20 MG tablet  Commonly known as:  ZOCOR  Take 20 mg by mouth at bedtime.           Follow-up Information    Follow up with Marlyce Huge, MD. Schedule an appointment as soon as possible for a visit in 1 week.   Specialty:  Surgery   Contact information:   [address] [place] [address] [phone]       Signed: Marlyce Huge 10/21/2014, 10:15 AM

## 2017-09-14 ENCOUNTER — Other Ambulatory Visit: Payer: Self-pay

## 2017-09-14 ENCOUNTER — Emergency Department: Payer: Medicare Other

## 2017-09-14 ENCOUNTER — Inpatient Hospital Stay
Admission: EM | Admit: 2017-09-14 | Discharge: 2017-09-17 | DRG: 683 | Disposition: A | Discharge status: Home / Self Care | Payer: Medicare Other | Attending: Internal Medicine | Admitting: Internal Medicine

## 2017-09-14 DIAGNOSIS — M1611 Unilateral primary osteoarthritis, right hip: Secondary | ICD-10-CM | POA: Diagnosis present

## 2017-09-14 DIAGNOSIS — Z79899 Other long term (current) drug therapy: Secondary | ICD-10-CM

## 2017-09-14 DIAGNOSIS — N179 Acute kidney failure, unspecified: Secondary | ICD-10-CM | POA: Diagnosis not present

## 2017-09-14 DIAGNOSIS — Z7982 Long term (current) use of aspirin: Secondary | ICD-10-CM

## 2017-09-14 DIAGNOSIS — R52 Pain, unspecified: Secondary | ICD-10-CM | POA: Diagnosis not present

## 2017-09-14 DIAGNOSIS — Z87891 Personal history of nicotine dependence: Secondary | ICD-10-CM

## 2017-09-14 DIAGNOSIS — Z7901 Long term (current) use of anticoagulants: Secondary | ICD-10-CM

## 2017-09-14 DIAGNOSIS — M25551 Pain in right hip: Secondary | ICD-10-CM | POA: Diagnosis present

## 2017-09-14 DIAGNOSIS — I4891 Unspecified atrial fibrillation: Secondary | ICD-10-CM | POA: Diagnosis present

## 2017-09-14 DIAGNOSIS — E785 Hyperlipidemia, unspecified: Secondary | ICD-10-CM | POA: Diagnosis present

## 2017-09-14 DIAGNOSIS — K439 Ventral hernia without obstruction or gangrene: Secondary | ICD-10-CM | POA: Diagnosis present

## 2017-09-14 DIAGNOSIS — N390 Urinary tract infection, site not specified: Secondary | ICD-10-CM

## 2017-09-14 DIAGNOSIS — R262 Difficulty in walking, not elsewhere classified: Secondary | ICD-10-CM

## 2017-09-14 DIAGNOSIS — I10 Essential (primary) hypertension: Secondary | ICD-10-CM | POA: Diagnosis present

## 2017-09-14 MED ORDER — KETOROLAC TROMETHAMINE 30 MG/ML IJ SOLN
10.0000 mg | Freq: Once | INTRAMUSCULAR | Status: AC
  Administered 2017-09-15: 9.9 mg via INTRAVENOUS
  Filled 2017-09-14: qty 1

## 2017-09-14 MED ORDER — HYDROCODONE-ACETAMINOPHEN 5-325 MG PO TABS
1.0000 | ORAL_TABLET | Freq: Once | ORAL | Status: AC
  Administered 2017-09-15: 1 via ORAL
  Filled 2017-09-14: qty 1

## 2017-09-14 NOTE — ED Provider Notes (Signed)
[facility] Emergency Department Provider Note   ____________________________________________   First MD Initiated Contact with Patient 09/14/17 2342     (approximate)  I have reviewed the triage vital signs and the nursing notes.   HISTORY  Chief Complaint Hip Pain    HPI Angela Leonard is a 83 y.o. female who presents to the ED from home with a chief complaint of nontraumatic right hip pain.  Patient reports pain to her right hip radiating to her knee for the past week.  Denies fall/injury/trauma.  Pain only on ambulation.  Has noted some pain to her right buttock as well.  Denies associated extremity weakness, numbness or tingling.  Also notes dysuria.  Denies recent fever, chills, chest pain, shortness of breath, abdominal pain, nausea, vomiting, diarrhea.  Denies recent travel.   Past Medical History:  Diagnosis Date  . Hypertension     Patient Active Problem List   Diagnosis Date Noted  . Symptomatic cholelithiasis 10/13/2014    Past Surgical History:  Procedure Laterality Date  . BACK SURGERY    . CHOLECYSTECTOMY N/A 10/14/2014   Procedure: LAPAROSCOPIC CHOLECYSTECTOMY;  Surgeon: Marlyce Huge, MD;  Location: [facility] ORS;  Service: General;  Laterality: N/A;  . COLON SURGERY      Prior to Admission medications   Medication Sig Start Date End Date Taking? Authorizing Provider  aspirin EC 81 MG tablet Take 81 mg by mouth at bedtime.   Yes [provider]  cholecalciferol (VITAMIN D) 1000 units tablet Take 1,000 Units by mouth daily.   Yes [provider]  diltiazem (TIAZAC) 120 MG 24 hr capsule Take 120 mg by mouth daily.   Yes [provider]  ELIQUIS 5 MG TABS tablet Take 5 mg by mouth every 12 (twelve) hours.   Yes [provider]  folic acid (FOLVITE) 1 MG tablet Take 1 mg by mouth daily.   Yes [provider]  methotrexate (RHEUMATREX) 2.5 MG tablet Take 6 tablets by mouth every  Thursday.   Yes [provider]  Multiple Vitamin (MULTIVITAMIN WITH MINERALS) TABS tablet Take 1 tablet by mouth daily.   Yes [provider]  ramipril (ALTACE) 10 MG capsule Take 10 mg by mouth daily.   Yes [provider]  simvastatin (ZOCOR) 20 MG tablet Take 20 mg by mouth at bedtime.   Yes [provider]  vitamin B-12 (CYANOCOBALAMIN) 1000 MCG tablet Take 1,000 mcg by mouth daily.   Yes [provider]  HYDROcodone-acetaminophen (NORCO/VICODIN) 5-325 MG per tablet Take 1-2 tablets by mouth every 6 (six) hours as needed for severe pain. Patient not taking: Reported on 09/15/2017 10/15/14   Marlyce Huge, MD    Allergies Patient has no known allergies.  Family History  Problem Relation Age of Onset  . Alzheimer's disease Mother   . Parkinson's disease Mother   . Cancer Father   . Heart disease Sister   . Parkinson's disease Brother   . Heart disease Brother     Social History Social History   Tobacco Use  . Smoking status: Former Smoker  Substance Use Topics  . Alcohol use: No  . Drug use: No    Review of Systems  Constitutional: No fever/chills. Eyes: No visual changes. ENT: No sore throat. Cardiovascular: Denies chest pain. Respiratory: Denies shortness of breath. Gastrointestinal: No abdominal pain.  No nausea, no vomiting.  No diarrhea.  No constipation. Genitourinary: Negative for dysuria. Musculoskeletal: Positive for right buttock and hip pain.  Negative for back pain. Skin: Negative for rash. Neurological: Negative for headaches, focal weakness or numbness.   ____________________________________________   PHYSICAL EXAM:  VITAL SIGNS: ED Triage Vitals  Enc Vitals Group     BP 09/14/17 2040 110/82     Pulse Rate 09/14/17 2040 (!) 112     Resp 09/14/17 2040 18     Temp 09/14/17 2040 98.4 F (36.9 C)     Temp Source 09/14/17 2040 Oral     SpO2 09/14/17 2040 97 %     Weight 09/14/17 2040 173 lb  (78.5 kg)     Height 09/14/17 2040 5' 3" (1.6 m)     Head Circumference --      Peak Flow --      Pain Score 09/14/17 2039 9     Pain Loc --      Pain Edu? --      Excl. in GC? --     Constitutional: Alert and oriented. Well appearing and in no acute distress. Eyes: Conjunctivae are normal. PERRL. EOMI. Head: Atraumatic. Nose: No congestion/rhinnorhea. Mouth/Throat: Mucous membranes are moist.  Oropharynx non-erythematous. Neck: No stridor.  No cervical spine tenderness to palpation. Cardiovascular: Normal rate, regular rhythm. Grossly normal heart sounds.  Good peripheral circulation. Respiratory: Normal respiratory effort.  No retractions. Lungs CTAB. Gastrointestinal: Soft and nontender. No distention. No abdominal bruits. No CVA tenderness. Musculoskeletal: No spinal tenderness to palpation.  Right buttock tender to palpation as is right hip with limited range of motion secondary to discomfort.  Straight leg raise positive at 45 degrees.  2+ femoral and distal pulses.  Calf is supple without swelling or tenderness.  Leg is symmetrically warm without evidence for ischemia.  Brisk, less than 5-second capillary refill.  No joint effusions. Neurologic:  Normal speech and language. No gross focal neurologic deficits are appreciated. No gait instability. Skin:  Skin is warm, dry and intact. No rash noted. Psychiatric: Mood and affect are normal. Speech and behavior are normal.  ____________________________________________   LABS (all labs ordered are listed, but only abnormal results are displayed)  Labs Reviewed  URINALYSIS, COMPLETE (UACMP) WITH MICROSCOPIC - Abnormal; Notable for the following components:      Result Value   Color, Urine AMBER (*)    APPearance CLOUDY (*)    Hgb urine dipstick MODERATE (*)    Protein, ur 30 (*)    Leukocytes, UA LARGE (*)    Bacteria, UA MANY (*)    Squamous Epithelial / LPF 6-30 (*)    All other components within normal limits  CBC WITH  DIFFERENTIAL/PLATELET - Abnormal; Notable for the following components:   WBC 15.5 (*)    RDW 15.6 (*)    Neutro Abs 13.2 (*)    All other components within normal limits  BASIC METABOLIC PANEL - Abnormal; Notable for the following components:   Glucose, Bld 145 (*)    BUN 32 (*)    Creatinine, Ser 1.23 (*)    GFR calc non Af Amer 40 (*)    GFR calc Af Amer 47 (*)    All other components within normal limits  SEDIMENTATION RATE - Abnormal; Notable for the following components:   Sed Rate 51 (*)    All other components within normal limits  CULTURE, BLOOD (ROUTINE X 2)  CULTURE, BLOOD (ROUTINE X 2)  URINE CULTURE  LACTIC ACID, PLASMA  C-REACTIVE PROTEIN   ____________________________________________  EKG  None ____________________________________________  RADIOLOGY  ED MD interpretation: No acute   fracture or dislocation; degenerative changes in the hips; CT with trace right hip joint effusion  Official radiology report(s): Ct Hip Right Wo Contrast  Result Date: 09/15/2017 CLINICAL DATA:  Acute onset of right hip pain, radiating to the knee. EXAM: CT OF THE RIGHT HIP WITHOUT CONTRAST TECHNIQUE: Multidetector CT imaging of the right hip was performed according to the standard protocol. Multiplanar CT image reconstructions were also generated. COMPARISON:  Right hip radiographs performed 09/14/2016 FINDINGS: Bones/Joint/Cartilage There is no evidence of fracture or dislocation. No osseous erosions are seen. The right femoral head remains seated at the acetabulum. A trace right hip joint effusion is noted, with suggestion of mild capsular thickening. Would correlate clinically to exclude infection. There is posterior narrowing of the right hip joint. The cartilage is not well assessed on CT. Ligaments Suboptimally assessed by CT. Muscles and Tendons The visualized musculature is unremarkable in appearance. The visualized tendon structures are unremarkable. Soft tissues Scattered  vascular calcifications are seen. The visualized small and large bowel loops are grossly unremarkable. No soft tissue hematoma is seen. IMPRESSION: 1. Trace right hip joint effusion, with suggestion of mild capsular thickening. Would correlate clinically to exclude infection. 2. No evidence of fracture or dislocation. No osseous erosions seen. 3. Mild degenerative posterior narrowing of the right hip joint. 4. Scattered vascular calcifications seen. Electronically Signed   By: Jeffery  Chang M.D.   On: 09/15/2017 00:50   Dg Hip Unilat With Pelvis 2-3 Views Right  Result Date: 09/14/2017 CLINICAL DATA:  Left hip and leg pain. EXAM: DG HIP (WITH OR WITHOUT PELVIS) 2-3V RIGHT COMPARISON:  None. FINDINGS: Degenerative changes in the hips bilaterally with joint space narrowing and spurring. SI joints are symmetric and unremarkable. No acute bony abnormality. Specifically, no fracture, subluxation, or dislocation. Vascular calcifications noted. IMPRESSION: Mild to moderate symmetric degenerative changes in the hips. No acute bony abnormality. Electronically Signed   By: Kevin  Dover M.D.   On: 09/14/2017 21:22    ____________________________________________   PROCEDURES  Procedure(s) performed: None  Procedures  Critical Care performed: No  ____________________________________________   INITIAL IMPRESSION / ASSESSMENT AND PLAN / ED COURSE  As part of my medical decision making, I reviewed the following data within the electronic medical record:  History obtained from family, Nursing notes reviewed and incorporated, Old chart reviewed, Radiograph reviewed and Notes from prior ED visits   82-year-old female who presents with nontraumatic right buttock and hip pain for the past week which is worsened on ambulation.  Plain film x-rays are negative for bony abnormalities.  Clinically patient's symptoms are more consistent with sciatica.  Will administer IV Toradol and Norco for pain.  Will obtain CT  head to evaluate for occult fracture or abnormalities.  Clinical Course as of Sep 16 531  Mon Sep 15, 2017  0056 Updated patient and family member of urinalysis and CT hip results.  She is feeling significantly improved after Toradol and Norco and states "I can walk home".  Clinically patient's right hip is not suggestive of septic joint.  There is no warmth or erythema overlying the hip.  She is able to move it although with some pain.  More likely causes bursitis.  However, will evaluate for infection with blood cultures, CBC, lactate, ESR and CRP.  IV Rocephin for UTI.   [JS]  0254 Attempted to ambulate patient but she is refusing to bear weight on the affected hip.  Spoke with orthopedics on-call Dr. Patel who does recommend MRI this morning   to further characterize trace fluid seen on CT scan.  Feel she would be appropriate to admit to the hospitalist service after MRI with orthopedics consultation.   [JS]  0512 Awaiting MRI results.  Discussed case with hospitalist who will evaluate patient in the emergency department for admission.   [JS]  0518 Spoke with Dr. Chang regarding preliminary MRI results demonstrating tiny effusion around the hip and small amount of muscle edema below the right femoral neck   [JS]    Clinical Course User Index [JS] ,  J, MD     ____________________________________________   FINAL CLINICAL IMPRESSION(S) / ED DIAGNOSES  Final diagnoses:  Right hip pain  Urinary tract infection without hematuria, site unspecified  Intractable pain  Unable to ambulate     ED Discharge Orders    None       Note:  This document was prepared using Dragon voice recognition software and may include unintentional dictation errors.    ,  J, MD 09/15/17 0546  

## 2017-09-14 NOTE — ED Triage Notes (Addendum)
Reports pain to right hip and leg for approximately 1 week.  Patient denies any type of injury.

## 2017-09-15 ENCOUNTER — Encounter: Payer: Self-pay | Admitting: Internal Medicine

## 2017-09-15 ENCOUNTER — Emergency Department: Payer: Medicare Other

## 2017-09-15 DIAGNOSIS — K439 Ventral hernia without obstruction or gangrene: Secondary | ICD-10-CM | POA: Diagnosis present

## 2017-09-15 DIAGNOSIS — N179 Acute kidney failure, unspecified: Secondary | ICD-10-CM | POA: Diagnosis present

## 2017-09-15 DIAGNOSIS — Z79899 Other long term (current) drug therapy: Secondary | ICD-10-CM | POA: Diagnosis not present

## 2017-09-15 DIAGNOSIS — N390 Urinary tract infection, site not specified: Secondary | ICD-10-CM | POA: Diagnosis present

## 2017-09-15 DIAGNOSIS — Z7901 Long term (current) use of anticoagulants: Secondary | ICD-10-CM | POA: Diagnosis not present

## 2017-09-15 DIAGNOSIS — E785 Hyperlipidemia, unspecified: Secondary | ICD-10-CM | POA: Diagnosis present

## 2017-09-15 DIAGNOSIS — I10 Essential (primary) hypertension: Secondary | ICD-10-CM | POA: Diagnosis present

## 2017-09-15 DIAGNOSIS — M25551 Pain in right hip: Secondary | ICD-10-CM | POA: Diagnosis present

## 2017-09-15 DIAGNOSIS — Z87891 Personal history of nicotine dependence: Secondary | ICD-10-CM | POA: Diagnosis not present

## 2017-09-15 DIAGNOSIS — Z7982 Long term (current) use of aspirin: Secondary | ICD-10-CM | POA: Diagnosis not present

## 2017-09-15 DIAGNOSIS — R52 Pain, unspecified: Secondary | ICD-10-CM | POA: Diagnosis present

## 2017-09-15 DIAGNOSIS — I4891 Unspecified atrial fibrillation: Secondary | ICD-10-CM | POA: Diagnosis present

## 2017-09-15 DIAGNOSIS — M1611 Unilateral primary osteoarthritis, right hip: Secondary | ICD-10-CM | POA: Diagnosis present

## 2017-09-15 LAB — URINALYSIS, COMPLETE (UACMP) WITH MICROSCOPIC
Bilirubin Urine: NEGATIVE
Glucose, UA: NEGATIVE mg/dL
KETONES UR: NEGATIVE mg/dL
Nitrite: NEGATIVE
PH: 5 (ref 5.0–8.0)
Protein, ur: 30 mg/dL — AB
Specific Gravity, Urine: 1.021 (ref 1.005–1.030)

## 2017-09-15 LAB — TSH: TSH: 1.49 u[IU]/mL (ref 0.350–4.500)

## 2017-09-15 LAB — BASIC METABOLIC PANEL
ANION GAP: 8 (ref 5–15)
BUN: 32 mg/dL — ABNORMAL HIGH (ref 6–20)
CALCIUM: 9.2 mg/dL (ref 8.9–10.3)
CHLORIDE: 104 mmol/L (ref 101–111)
CO2: 23 mmol/L (ref 22–32)
Creatinine, Ser: 1.23 mg/dL — ABNORMAL HIGH (ref 0.44–1.00)
GFR calc non Af Amer: 40 mL/min — ABNORMAL LOW (ref >60)
GFR, EST AFRICAN AMERICAN: 47 mL/min — AB (ref >60)
Glucose, Bld: 145 mg/dL — ABNORMAL HIGH (ref 65–99)
Potassium: 4 mmol/L (ref 3.5–5.1)
SODIUM: 135 mmol/L (ref 135–145)

## 2017-09-15 LAB — CBC WITH DIFFERENTIAL/PLATELET
BASOS PCT: 0 %
Basophils Absolute: 0 10*3/uL (ref 0–0.1)
EOS ABS: 0 10*3/uL (ref 0–0.7)
Eosinophils Relative: 0 %
HCT: 42.3 % (ref 35.0–47.0)
HEMOGLOBIN: 14.1 g/dL (ref 12.0–16.0)
Lymphocytes Relative: 9 %
Lymphs Abs: 1.4 10*3/uL (ref 1.0–3.6)
MCH: 33 pg (ref 26.0–34.0)
MCHC: 33.4 g/dL (ref 32.0–36.0)
MCV: 98.7 fL (ref 80.0–100.0)
Monocytes Absolute: 0.9 10*3/uL (ref 0.2–0.9)
Monocytes Relative: 6 %
Neutro Abs: 13.2 10*3/uL — ABNORMAL HIGH (ref 1.4–6.5)
Neutrophils Relative %: 85 %
Platelets: 191 10*3/uL (ref 150–440)
RBC: 4.28 MIL/uL (ref 3.80–5.20)
RDW: 15.6 % — AB (ref 11.5–14.5)
WBC: 15.5 10*3/uL — AB (ref 3.6–11.0)

## 2017-09-15 LAB — SEDIMENTATION RATE: Sed Rate: 51 mm/hr — ABNORMAL HIGH (ref 0–30)

## 2017-09-15 LAB — C-REACTIVE PROTEIN: CRP: 9.5 mg/dL — ABNORMAL HIGH (ref <1.0)

## 2017-09-15 LAB — LACTIC ACID, PLASMA: LACTIC ACID, VENOUS: 1.2 mmol/L (ref 0.5–1.9)

## 2017-09-15 MED ORDER — SODIUM CHLORIDE 0.9 % IV SOLN
1.0000 g | INTRAVENOUS | Status: DC
  Administered 2017-09-15 – 2017-09-16 (×2): 1 g via INTRAVENOUS
  Filled 2017-09-15 (×3): qty 10

## 2017-09-15 MED ORDER — VITAMIN B-12 1000 MCG PO TABS
1000.0000 ug | ORAL_TABLET | Freq: Every day | ORAL | Status: DC
  Administered 2017-09-15 – 2017-09-17 (×3): 1000 ug via ORAL
  Filled 2017-09-15 (×3): qty 1

## 2017-09-15 MED ORDER — KETOROLAC TROMETHAMINE 30 MG/ML IJ SOLN: 15.0000 mg | Freq: Three times a day (TID) | INTRAMUSCULAR | Status: DC

## 2017-09-15 MED ORDER — METHOTREXATE 2.5 MG PO TABS: 15.0000 mg | ORAL_TABLET | ORAL | Status: DC

## 2017-09-15 MED ORDER — ACETAMINOPHEN 650 MG RE SUPP: 650.0000 mg | Freq: Four times a day (QID) | RECTAL | Status: DC | PRN

## 2017-09-15 MED ORDER — DILTIAZEM HCL ER COATED BEADS 120 MG PO CP24
120.0000 mg | ORAL_CAPSULE | Freq: Every day | ORAL | Status: DC
  Administered 2017-09-15 – 2017-09-17 (×3): 120 mg via ORAL
  Filled 2017-09-15 (×3): qty 1

## 2017-09-15 MED ORDER — ONDANSETRON HCL 4 MG/2ML IJ SOLN: 4.0000 mg | Freq: Four times a day (QID) | INTRAMUSCULAR | Status: DC | PRN

## 2017-09-15 MED ORDER — CEFTRIAXONE SODIUM 1 G IJ SOLR
1.0000 g | Freq: Once | INTRAMUSCULAR | Status: AC
  Administered 2017-09-15: 1 g via INTRAVENOUS
  Filled 2017-09-15: qty 10

## 2017-09-15 MED ORDER — FOLIC ACID 1 MG PO TABS
1.0000 mg | ORAL_TABLET | Freq: Every day | ORAL | Status: DC
  Administered 2017-09-15 – 2017-09-17 (×3): 1 mg via ORAL
  Filled 2017-09-15 (×3): qty 1

## 2017-09-15 MED ORDER — SODIUM CHLORIDE 0.9 % IV SOLN
INTRAVENOUS | Status: DC
  Administered 2017-09-15 – 2017-09-16 (×3): via INTRAVENOUS

## 2017-09-15 MED ORDER — ACETAMINOPHEN 325 MG PO TABS: 650.0000 mg | ORAL_TABLET | Freq: Four times a day (QID) | ORAL | Status: DC | PRN

## 2017-09-15 MED ORDER — ADULT MULTIVITAMIN W/MINERALS CH
1.0000 | ORAL_TABLET | Freq: Every day | ORAL | Status: DC
  Administered 2017-09-15 – 2017-09-17 (×3): 1 via ORAL
  Filled 2017-09-15 (×3): qty 1

## 2017-09-15 MED ORDER — KETOROLAC TROMETHAMINE 30 MG/ML IJ SOLN
15.0000 mg | Freq: Three times a day (TID) | INTRAMUSCULAR | Status: DC
  Administered 2017-09-15 – 2017-09-17 (×5): 15 mg via INTRAVENOUS
  Filled 2017-09-15 (×6): qty 1

## 2017-09-15 MED ORDER — VITAMIN D 1000 UNITS PO TABS
1000.0000 [IU] | ORAL_TABLET | Freq: Every day | ORAL | Status: DC
  Administered 2017-09-15 – 2017-09-17 (×3): 1000 [IU] via ORAL
  Filled 2017-09-15 (×3): qty 1

## 2017-09-15 MED ORDER — ATORVASTATIN CALCIUM 10 MG PO TABS
10.0000 mg | ORAL_TABLET | Freq: Every day | ORAL | Status: DC
Start: 2017-09-15 — End: 2017-09-17
  Administered 2017-09-15 – 2017-09-16 (×2): 10 mg via ORAL
  Filled 2017-09-15 (×2): qty 1

## 2017-09-15 MED ORDER — KETOROLAC TROMETHAMINE 30 MG/ML IJ SOLN
30.0000 mg | Freq: Three times a day (TID) | INTRAMUSCULAR | Status: DC
  Administered 2017-09-15: 30 mg via INTRAVENOUS
  Filled 2017-09-15 (×3): qty 1

## 2017-09-15 MED ORDER — DOCUSATE SODIUM 100 MG PO CAPS
100.0000 mg | ORAL_CAPSULE | Freq: Two times a day (BID) | ORAL | Status: DC
  Administered 2017-09-15 – 2017-09-17 (×5): 100 mg via ORAL
  Filled 2017-09-15 (×5): qty 1

## 2017-09-15 MED ORDER — RAMIPRIL 10 MG PO CAPS
10.0000 mg | ORAL_CAPSULE | Freq: Every day | ORAL | Status: DC
  Administered 2017-09-15 – 2017-09-17 (×3): 10 mg via ORAL
  Filled 2017-09-15 (×3): qty 1

## 2017-09-15 MED ORDER — ASPIRIN EC 81 MG PO TBEC
81.0000 mg | DELAYED_RELEASE_TABLET | Freq: Every day | ORAL | Status: DC
  Administered 2017-09-15 – 2017-09-16 (×2): 81 mg via ORAL
  Filled 2017-09-15 (×2): qty 1

## 2017-09-15 MED ORDER — PREDNISONE 50 MG PO TABS
50.0000 mg | ORAL_TABLET | Freq: Every day | ORAL | Status: DC
  Administered 2017-09-16 – 2017-09-17 (×2): 50 mg via ORAL
  Filled 2017-09-15 (×2): qty 1

## 2017-09-15 MED ORDER — APIXABAN 5 MG PO TABS
5.0000 mg | ORAL_TABLET | Freq: Two times a day (BID) | ORAL | Status: DC
  Administered 2017-09-15 – 2017-09-17 (×5): 5 mg via ORAL
  Filled 2017-09-15 (×5): qty 1

## 2017-09-15 MED ORDER — SIMVASTATIN 20 MG PO TABS: 20.0000 mg | ORAL_TABLET | Freq: Every day | ORAL | Status: DC

## 2017-09-15 MED ORDER — ONDANSETRON HCL 4 MG PO TABS: 4.0000 mg | ORAL_TABLET | Freq: Four times a day (QID) | ORAL | Status: DC | PRN

## 2017-09-15 NOTE — Consult Note (Addendum)
ORTHOPAEDIC CONSULTATION  REQUESTING PHYSICIAN: Dustin Flock, MD  Chief Complaint:   R hip pain  History of Present Illness: Angela Leonard is a 83 y.o. female into the emergency room last night with complaints of right hip pain.  She notes that her pain is worsened over the last week without traumatic event.  She has difficulty weightbearing on her hip now.  She does have the pain as a 9 out of 10 in severity.  Pain is worse with any sort of movement.  It is also worse with weightbearing.  Pain is improved with rest.  Pain is located in the anterior groin region.  She notes that she has had pain like this in the past but it has not been as pronounced.  She also notes she is having symptoms of a UTI.  Past Medical History:  Diagnosis Date  . Hypertension    Past Surgical History:  Procedure Laterality Date  . BACK SURGERY    . CHOLECYSTECTOMY N/A 10/14/2014   Procedure: LAPAROSCOPIC CHOLECYSTECTOMY;  Surgeon: Marlyce Huge, MD;  Location: [facility] ORS;  Service: General;  Laterality: N/A;  . COLON SURGERY     Social History   Socioeconomic History  . Marital status: Widowed    Spouse name: Not on file  . Number of children: Not on file  . Years of education: Not on file  . Highest education level: Not on file  Occupational History  . Not on file  Social Needs  . Financial resource strain: Not on file  . Food insecurity:    Worry: Not on file    Inability: Not on file  . Transportation needs:    Medical: Not on file    Non-medical: Not on file  Tobacco Use  . Smoking status: Former Smoker  Substance and Sexual Activity  . Alcohol use: No  . Drug use: No  . Sexual activity: Not on file  Lifestyle  . Physical activity:    Days per week: Not on file    Minutes per session: Not on file  . Stress: Not on file  Relationships  . Social connections:    Talks on phone: Not on file    Gets together: Not  on file    Attends religious service: Not on file    Active member of club or organization: Not on file    Attends meetings of clubs or organizations: Not on file    Relationship status: Not on file  Other Topics Concern  . Not on file  Social History Narrative  . Not on file   Family History  Problem Relation Age of Onset  . Alzheimer's disease Mother   . Parkinson's disease Mother   . Cancer Father   . Heart disease Sister   . Parkinson's disease Brother   . Heart disease Brother    No Known Allergies Prior to Admission medications   Medication Sig Start Date End Date Taking? Authorizing Provider  aspirin EC 81 MG tablet Take 81 mg by mouth at bedtime.   Yes [provider]  cholecalciferol (VITAMIN D) 1000 units tablet Take 1,000 Units by mouth daily.   Yes [provider]  diltiazem (TIAZAC) 120 MG 24 hr capsule Take 120 mg by mouth daily.   Yes [provider]  ELIQUIS 5 MG TABS tablet Take 5 mg by mouth every 12 (twelve) hours.   Yes [provider]  folic acid (FOLVITE) 1 MG tablet Take 1 mg by mouth daily.  Yes [provider]  methotrexate (RHEUMATREX) 2.5 MG tablet Take 6 tablets by mouth every Thursday.   Yes [provider]  Multiple Vitamin (MULTIVITAMIN WITH MINERALS) TABS tablet Take 1 tablet by mouth daily.   Yes [provider]  ramipril (ALTACE) 10 MG capsule Take 10 mg by mouth daily.   Yes [provider]  simvastatin (ZOCOR) 20 MG tablet Take 20 mg by mouth at bedtime.   Yes [provider]  vitamin B-12 (CYANOCOBALAMIN) 1000 MCG tablet Take 1,000 mcg by mouth daily.   Yes [provider]  HYDROcodone-acetaminophen (NORCO/VICODIN) 5-325 MG per tablet Take 1-2 tablets by mouth every 6 (six) hours as needed for severe pain. Patient not taking: Reported on 09/15/2017 10/15/14   Marlyce Huge, MD   Recent Labs    09/15/17 0113  WBC 15.5*  HGB 14.1  HCT 42.3  PLT  191  K 4.0  CL 104  CO2 23  BUN 32*  CREATININE 1.23*  GLUCOSE 145*  CALCIUM 9.2   Ct Hip Right Wo Contrast  Result Date: 09/15/2017 CLINICAL DATA:  Acute onset of right hip pain, radiating to the knee. EXAM: CT OF THE RIGHT HIP WITHOUT CONTRAST TECHNIQUE: Multidetector CT imaging of the right hip was performed according to the standard protocol. Multiplanar CT image reconstructions were also generated. COMPARISON:  Right hip radiographs performed 09/14/2016 FINDINGS: Bones/Joint/Cartilage There is no evidence of fracture or dislocation. No osseous erosions are seen. The right femoral head remains seated at the acetabulum. A trace right hip joint effusion is noted, with suggestion of mild capsular thickening. Would correlate clinically to exclude infection. There is posterior narrowing of the right hip joint. The cartilage is not well assessed on CT. Ligaments Suboptimally assessed by CT. Muscles and Tendons The visualized musculature is unremarkable in appearance. The visualized tendon structures are unremarkable. Soft tissues Scattered vascular calcifications are seen. The visualized small and large bowel loops are grossly unremarkable. No soft tissue hematoma is seen. IMPRESSION: 1. Trace right hip joint effusion, with suggestion of mild capsular thickening. Would correlate clinically to exclude infection. 2. No evidence of fracture or dislocation. No osseous erosions seen. 3. Mild degenerative posterior narrowing of the right hip joint. 4. Scattered vascular calcifications seen. Electronically Signed   By: Garald Balding M.D.   On: 09/15/2017 00:50   Dg Hip Unilat With Pelvis 2-3 Views Right  Result Date: 09/14/2017 CLINICAL DATA:  Left hip and leg pain. EXAM: DG HIP (WITH OR WITHOUT PELVIS) 2-3V RIGHT COMPARISON:  None. FINDINGS: Degenerative changes in the hips bilaterally with joint space narrowing and spurring. SI joints are symmetric and unremarkable. No acute bony abnormality. Specifically,  no fracture, subluxation, or dislocation. Vascular calcifications noted. IMPRESSION: Mild to moderate symmetric degenerative changes in the hips. No acute bony abnormality. Electronically Signed   By: Rolm Baptise M.D.   On: 09/14/2017 21:22   Component     Latest Ref Rng & Units 09/15/2017  Color, Urine     YELLOW AMBER (A)  Appearance     CLEAR CLOUDY (A)  Specific Gravity, Urine     1.005 - 1.030 1.021  pH     5.0 - 8.0 5.0  Glucose     NEGATIVE mg/dL NEGATIVE  Hgb urine dipstick     NEGATIVE MODERATE (A)  Bilirubin Urine     NEGATIVE NEGATIVE  Ketones, ur     NEGATIVE mg/dL NEGATIVE  Protein     NEGATIVE mg/dL 30 (A)  Nitrite  NEGATIVE NEGATIVE  Leukocytes, UA     NEGATIVE LARGE (A)  RBC / HPF     0 - 5 RBC/hpf 6-30  WBC, UA     0 - 5 WBC/hpf TOO NUMEROUS TO COUNT  Bacteria, UA     NONE SEEN MANY (A)  Squamous Epithelial / LPF     NONE SEEN 6-30 (A)  WBC Clumps      PRESENT    Component     Latest Ref Rng & Units 09/15/2017  Sed Rate     0 - 30 mm/hr 51 (H)     Positive ROS: All other systems have been reviewed and were otherwise negative with the exception of those mentioned in the HPI and as above.  Physical Exam: BP 104/74 (BP Location: Left Arm)   Pulse 90   Temp 98 F (36.7 C) (Oral)   Resp 17   Ht 5\' 3"  (1.6 m)   Wt 85.3 kg (188 lb)   SpO2 94%   BMI 33.30 kg/m  General:  Alert, no acute distress Psychiatric:  Patient is competent for consent with normal mood and affect   Cardiovascular:  No pedal edema, irregular rate and rhythm Respiratory:  No wheezing, non-labored breathing GI:  Abdomen is soft and non-tender Skin:  No lesions in the area of chief complaint, no erythema Neurologic:  Sensation intact distally, CN grossly intact Lymphatic:  No axillary or cervical lymphadenopathy  Orthopedic Exam:  RLE: 5/5 DF/PF/EHL SILT s/s/t/sp/dp distr Foot wwp RoM Hip: able to get to 50 degrees hip flexion, 30 ER, minimal IR that is only mildly  painful Foot resting in a neutral position without hip flexion or ER   X-rays/CT Scan/MRI:  As above: Severe degenerative changes of the right hip with joint space narrowing and osteophyte formation.  CT scan confirms these findings and also shows a joint effusion with capsular thickening.  MRI redemonstrates small joint effusion.  However there is no subchondral or marrow edema or other findings of infection.  Short external rotator muscle strains seen as well.  Assessment/Plan: 83 year old female with right hip pain likely due to arthritic flare and/or muscle strain.  1.  Would recommend further continued conservative with NSAIDs such as IV Toradol and Tylenol 1000 mg every 8 hours scheduled. She also has a history of rheumatoid arthritis and would consider Rheumatology consult.  2.  Continue to treat UTI with antibiotics.  3.  No plan for operative intervention today unless clinical exam severely worsens.  4.  We will continue to follow along.  5.  Patient admitted to hospitalist service and will defer remainder of care to hospitalist team.   Leim Fabry  09/15/2017 7:21 AM

## 2017-09-15 NOTE — Progress Notes (Signed)
Angela Leonard at [facility]                                                                                                                                                                                  Patient Demographics   Angela Leonard, is a 83 y.o. female, DOB - [birth year], CBU:[id number]  Admit date - 09/14/2017   Admitting Physician Harrie Foreman, MD  Outpatient Primary MD for the patient is Derinda Late, MD   LOS - 0  Subjective: Patient admitted with hip pain noted to have urinary tract infection Patient continues complain of pain in the hip    Review of Systems:   CONSTITUTIONAL: No documented fever. No fatigue, weakness. No weight gain, no weight loss.  EYES: No blurry or double vision.  ENT: No tinnitus. No postnasal drip. No redness of the oropharynx.  RESPIRATORY: No cough, no wheeze, no hemoptysis. No dyspnea.  CARDIOVASCULAR: No chest pain. No orthopnea. No palpitations. No syncope.  GASTROINTESTINAL: No nausea, no vomiting or diarrhea. No abdominal pain. No melena or hematochezia.  GENITOURINARY: No dysuria or hematuria.  ENDOCRINE: No polyuria or nocturia. No heat or cold intolerance.  HEMATOLOGY: No anemia. No bruising. No bleeding.  INTEGUMENTARY: No rashes. No lesions.  MUSCULOSKELETAL: No arthritis. No swelling. No gout.  Positive hip pain NEUROLOGIC: No numbness, tingling, or ataxia. No seizure-type activity.  PSYCHIATRIC: No anxiety. No insomnia. No ADD.    Vitals:   Vitals:   09/15/17 0628 09/15/17 0649 09/15/17 0721 09/15/17 0937  BP: 129/71  104/74 (!) 115/50  Pulse: (!) 110  90   Resp: 19  17   Temp: 97.8 F (36.6 C)  98 F (36.7 C)   TempSrc: Oral  Oral   SpO2: 93%  94%   Weight:  85.3 kg (188 lb)    Height:        Wt Readings from Last 3 Encounters:  09/15/17 85.3 kg (188 lb)  10/13/14 78.5 kg (173 lb)  10/13/14 78.5 kg (173 lb)     Intake/Output Summary (Last 24 hours) at 09/15/2017 1212 Last data  filed at 09/15/2017 0951 Gross per 24 hour  Intake 480 ml  Output -  Net 480 ml    Physical Exam:   GENERAL: Pleasant-appearing in no apparent distress.  HEAD, EYES, EARS, NOSE AND THROAT: Atraumatic, normocephalic. Extraocular muscles are intact. Pupils equal and reactive to light. Sclerae anicteric. No conjunctival injection. No oro-pharyngeal erythema.  NECK: Supple. There is no jugular venous distention. No bruits, no lymphadenopathy, no thyromegaly.  HEART: Regular rate and rhythm,. No murmurs, no rubs, no clicks.  LUNGS: Clear to auscultation bilaterally. No rales or rhonchi.  No wheezes.  ABDOMEN: Soft, flat, nontender, nondistended. Has good bowel sounds. No hepatosplenomegaly appreciated.  EXTREMITIES: No evidence of any cyanosis, clubbing, or peripheral edema.  +2 pedal and radial pulses bilaterally.  NEUROLOGIC: The patient is alert, awake, and oriented x3 with no focal motor or sensory deficits appreciated bilaterally.  SKIN: Moist and warm with no rashes appreciated.  Psych: Not anxious, depressed LN: No inguinal LN enlargement    Antibiotics   Anti-infectives (From admission, onward)   Start     Dose/Rate Route Frequency Ordered Stop   09/15/17 1800  cefTRIAXone (ROCEPHIN) 1 g in sodium chloride 0.9 % 100 mL IVPB     1 g 200 mL/hr over 30 Minutes Intravenous Every 24 hours 09/15/17 1012     09/15/17 0100  cefTRIAXone (ROCEPHIN) 1 g in sodium chloride 0.9 % 100 mL IVPB     1 g 200 mL/hr over 30 Minutes Intravenous  Once 09/15/17 0056 09/15/17 0322      Medications   Scheduled Meds: . apixaban  5 mg Oral Q12H  . aspirin EC  81 mg Oral QHS  . cholecalciferol  1,000 Units Oral Daily  . diltiazem  120 mg Oral Daily  . docusate sodium  100 mg Oral BID  . folic acid  1 mg Oral Daily  . ketorolac  30 mg Intravenous Q8H  . [START ON 09/18/2017] methotrexate  15 mg Oral Q Thu  . multivitamin with minerals  1 tablet Oral Daily  . ramipril  10 mg Oral Daily  .  simvastatin  20 mg Oral QHS  . vitamin B-12  1,000 mcg Oral Daily   Continuous Infusions: . sodium chloride 100 mL/hr at 09/15/17 0650  . cefTRIAXone (ROCEPHIN)  IV     PRN Meds:.acetaminophen **OR** acetaminophen, ondansetron **OR** ondansetron (ZOFRAN) IV   Data Review:   Micro Results Recent Results (from the past 240 hour(s))  Culture, blood (routine x 2)     Status: None (Preliminary result)   Collection Time: 09/15/17  1:13 AM  Result Value Ref Range Status   Specimen Description BLOOD LEFT ANTECUBITAL  Final   Special Requests   Final    BOTTLES DRAWN AEROBIC AND ANAEROBIC Blood Culture results may not be optimal due to an excessive volume of blood received in culture bottles   Culture   Final    NO GROWTH < 12 HOURS Performed at [facility], [address], [place], [place] [address]    Report Status PENDING  Incomplete  Culture, blood (routine x 2)     Status: None (Preliminary result)   Collection Time: 09/15/17  1:13 AM  Result Value Ref Range Status   Specimen Description BLOOD LEFT HAND  Final   Special Requests   Final    BOTTLES DRAWN AEROBIC AND ANAEROBIC Blood Culture adequate volume   Culture   Final    NO GROWTH < 12 HOURS Performed at [facility], [address], [place], [place] [address]    Report Status PENDING  Incomplete    Radiology Reports Ct Hip Right Wo Contrast  Result Date: 09/15/2017 CLINICAL DATA:  Acute onset of right hip pain, radiating to the knee. EXAM: CT OF THE RIGHT HIP WITHOUT CONTRAST TECHNIQUE: Multidetector CT imaging of the right hip was performed according to the standard protocol. Multiplanar CT image reconstructions were also generated. COMPARISON:  Right hip radiographs performed 09/14/2016 FINDINGS: Bones/Joint/Cartilage There is no evidence of fracture or dislocation. No osseous erosions are seen. The  right femoral head remains seated at the acetabulum. A trace right hip joint effusion is noted, with  suggestion of mild capsular thickening. Would correlate clinically to exclude infection. There is posterior narrowing of the right hip joint. The cartilage is not well assessed on CT. Ligaments Suboptimally assessed by CT. Muscles and Tendons The visualized musculature is unremarkable in appearance. The visualized tendon structures are unremarkable. Soft tissues Scattered vascular calcifications are seen. The visualized small and large bowel loops are grossly unremarkable. No soft tissue hematoma is seen. IMPRESSION: 1. Trace right hip joint effusion, with suggestion of mild capsular thickening. Would correlate clinically to exclude infection. 2. No evidence of fracture or dislocation. No osseous erosions seen. 3. Mild degenerative posterior narrowing of the right hip joint. 4. Scattered vascular calcifications seen. Electronically Signed   By: Garald Balding M.D.   On: 09/15/2017 00:50   Mr Hip Right Wo Contrast  Result Date: 09/15/2017 CLINICAL DATA:  83 year old with right hip and thigh pain. No known injury or prior relevant surgery. EXAM: MR OF THE RIGHT HIP WITHOUT CONTRAST TECHNIQUE: Multiplanar, multisequence MR imaging was performed. No intravenous contrast was administered. COMPARISON:  CT of the right hip 09/15/2017. Abdominal MRI 10/13/2014. FINDINGS: Bones: There is no evidence of acute fracture, dislocation or avascular necrosis. The visualized bony pelvis appears normal. The visualized sacroiliac joints and symphysis pubis appear normal. Articular cartilage and labrum Articular cartilage: There are asymmetric right hip degenerative changes with femoral head osteophytes. No focal chondral defect or subchondral edema demonstrated. Labrum: There is no gross labral tear or paralabral abnormality. Joint or bursal effusion Joint effusion: As seen on CT, there is a small right hip joint effusion. Bursae: No focal periarticular fluid collection. Muscles and tendons Muscles and tendons: There is mild  asymmetric edema within the right obturator externus and superior aspect of the right gluteus minimus muscles. There is minimal edema tracking between the right iliacus muscle and the right iliac bone. The hamstring, gluteus and iliopsoas tendons are intact. Other findings Miscellaneous: There is a right paramedian abdominal wall hernia containing a portion of the transverse colon. Diverticular changes are present throughout the visualized colon. No evidence of bowel obstruction or incarceration. Mild subcutaneous edema within the anterior abdominal wall bilaterally. There is T2 hyperintensity within the endometrial canal which is distended to 1.8 cm. IMPRESSION: 1. No acute osseous findings. 2. Asymmetric right hip degenerative changes with small right hip joint effusion, nonspecific. There is no subchondral edema or periarticular inflammation to suggest infection. 3. Mild edema within the right obturator externus and gluteus minimus muscles, likely strain. 4. Abnormal distended, fluid-filled endometrial canal. This is partially visualized on previous abdominal MRI and likely due to cervical stenosis given relative stability. However, pelvic ultrasound should be considered, especially if the patient has any vaginal bleeding. 5. Right abdominal wall hernia containing transverse colon. No evidence of bowel obstruction. 6. A preliminary report of these findings was issued by Dr. Dixon Boos at [phone] hours on 09/15/2017 and discussed with Dr. Beather Arbour. I called the endometrial canal findings to Dr. Conni Slipper at [phone] hours. Electronically Signed   By: Richardean Sale M.D.   On: 09/15/2017 07:24   Dg Hip Unilat With Pelvis 2-3 Views Right  Result Date: 09/14/2017 CLINICAL DATA:  Left hip and leg pain. EXAM: DG HIP (WITH OR WITHOUT PELVIS) 2-3V RIGHT COMPARISON:  None. FINDINGS: Degenerative changes in the hips bilaterally with joint space narrowing and spurring. SI joints are symmetric and unremarkable. No acute bony  abnormality. Specifically, no fracture, subluxation, or dislocation. Vascular calcifications noted. IMPRESSION: Mild to moderate symmetric degenerative changes in the hips. No acute bony abnormality. Electronically Signed   By: Rolm Baptise M.D.   On: 09/14/2017 21:22     CBC Recent Labs  Lab 09/15/17 0113  WBC 15.5*  HGB 14.1  HCT 42.3  PLT 191  MCV 98.7  MCH 33.0  MCHC 33.4  RDW 15.6*  LYMPHSABS 1.4  MONOABS 0.9  EOSABS 0.0  BASOSABS 0.0    Chemistries  Recent Labs  Lab 09/15/17 0113  NA 135  K 4.0  CL 104  CO2 23  GLUCOSE 145*  BUN 32*  CREATININE 1.23*  CALCIUM 9.2   ------------------------------------------------------------------------------------------------------------------ estimated creatinine clearance is 37.8 mL/min (A) (by C-G formula based on SCr of 1.23 mg/dL (H)). ------------------------------------------------------------------------------------------------------------------ No results for input(s): HGBA1C in the last 72 hours. ------------------------------------------------------------------------------------------------------------------ No results for input(s): CHOL, HDL, LDLCALC, TRIG, CHOLHDL, LDLDIRECT in the last 72 hours. ------------------------------------------------------------------------------------------------------------------ Recent Labs    09/15/17 0113  TSH 1.490   ------------------------------------------------------------------------------------------------------------------ No results for input(s): VITAMINB12, FOLATE, FERRITIN, TIBC, IRON, RETICCTPCT in the last 72 hours.  Coagulation profile No results for input(s): INR, PROTIME in the last 168 hours.  No results for input(s): DDIMER in the last 72 hours.  Cardiac Enzymes No results for input(s): CKMB, TROPONINI, MYOGLOBIN in the last 168 hours.  Invalid input(s):  CK ------------------------------------------------------------------------------------------------------------------ Invalid input(s): [place]   This is a 83 year old female admitted for acute kidney injury. 1.  Acute kidney injury: Continue IV fluids monitor renal function 2.   Right hip pain; seen by orthopedics no hip fracture noted on MRI Muscle strain noted we will treat with NSAIDs and prednisone 3.   UTI: Present on admission; continue ceftriaxone follow urine cultures 4.  Hypertension: Controlled; continue ACE inhibitor. 5.  Atrial fibrillation: Rate controlled; continue diltiazem and Eliquis 6.  Hyperlipidemia: Continue statin therapy 7.  DVT prophylaxis: Full dose anticoagulation 8.  GI prophylaxis: None       Code Status Orders  (From admission, onward)        Start     Ordered   09/15/17 0621  Full code  Continuous     09/15/17 0620    Code Status History    Date Active Date Inactive Code Status Order ID Comments User Context   10/13/2014 0556 10/15/2014 2057 Full Code [id number]  Molly Maduro, MD ED    Advance Directive Documentation     Most Recent Value  Type of Advance Directive  Healthcare Power of Attorney  Pre-existing out of facility DNR order (yellow form or pink MOST form)  -  "MOST" Form in Place?  -           Consults orthopedic  DVT Prophylaxis Eliquis  Lab Results  Component Value Date   PLT 191 09/15/2017     Time Spent in minutes   45 minutes greater than 50% of time spent in care coordination and counseling patient regarding the condition and plan of care.   Dustin Flock M.D on 09/15/2017 at 12:12 PM  Between 7am to 6pm - Pager - [phone]  After 6pm go to [email or web address] - Proofreader  [employer]   Office  [phone]

## 2017-09-15 NOTE — Evaluation (Signed)
Occupational Therapy Evaluation Patient Details Name: Angela Leonard MRN: [id number] DOB: 07-08-1934 Today's Date: 09/15/2017    History of Present Illness Pt is an 83 year old female admitted for acute kidney injury and noted to have UTI. PMHx includes HTN, HLD, Afib, and reumatoid arthritis.   Clinical Impression   Pt seen for OT evaluation this date. Prior to hospital admission, pt was independent with ADL, using a SPC intermittently in the community and holding onto furniture in the home. Pt lives with her daughter (likely unable to provide significant physical help, gone for a couple hours during the day) in a 1 story level entry home with a safety tub (has door to walk in and seat). No falls in past 12 months per pt/dtr report. Daughter provides assist for reminders to take meds (pt organizes with weekly pill box), transportation, and meals. Pt does household tasks. Currently pt demonstrates impairments in balance, pain in R hip and R knee to the point of minimal weight bearing with RW, and impaired strength. Pt is at a high falls risk for these reasons. Requires min-mod assist for LB ADL. Pt/dtr instructed in falls prevention strategies and hand placement during functional transfers to improve safety. Pt would benefit from skilled OT services to address noted impairments and functional limitations (see below for any additional details).  Upon hospital discharge, recommend pt discharge to [place]. Will continue to assess pt progress for appropriateness of HHOT.     Follow Up Recommendations  SNF    Equipment Recommendations  Other (comment)(TBD)    Recommendations for Other Services       Precautions / Restrictions Precautions Precautions: Fall Restrictions Weight Bearing Restrictions: No      Mobility Bed Mobility               General bed mobility comments: deferred, pt up in recliner  Transfers Overall transfer level: Needs assistance Equipment used: 1 person hand held  assist;Rolling walker (2 wheeled) Transfers: Sit to/from Stand Sit to Stand: Min assist         General transfer comment: initial min assist for transfer with pt using UB/trunk momentum to help with lift off, pt off weighting RLE (~toe touch WB'ing currently 2:2 pain with heavy reliance on UB strength)    Balance Overall balance assessment: Needs assistance Sitting-balance support: No upper extremity supported;Feet supported Sitting balance-[name] Scale: Fair     Standing balance support: Bilateral upper extremity supported Standing balance-[name] Scale: Poor                             ADL either performed or assessed with clinical judgement   ADL Overall ADL's : Needs assistance/impaired Eating/Feeding: Sitting;Independent   Grooming: Sitting;Independent   Upper Body Bathing: Sitting;Minimal assistance   Lower Body Bathing: Sit to/from stand;Moderate assistance;Minimal assistance   Upper Body Dressing : Sitting;Min guard   Lower Body Dressing: Sit to/from stand;Minimal assistance;Moderate assistance   Toilet Transfer: BSC;RW;Minimal assistance;Ambulation;Min guard           Functional mobility during ADLs: Min guard;Minimal assistance;Rolling walker;Cueing for safety       Vision Baseline Vision/History: Wears glasses Wears Glasses: At all times(reports needing new ones, also uses binoculars and magnifying glasses for up close vision) Patient Visual Report: No change from baseline Vision Assessment?: No apparent visual deficits     Perception     Praxis      Pertinent Vitals/Pain Pain Assessment: No/denies pain(0/10 during session, reports is shoots  up to 10/10 R hip>R knee intermittently)     Hand Dominance Right   Extremity/Trunk Assessment Upper Extremity Assessment Upper Extremity Assessment: Generalized weakness(grossly 4/5 bilaterally, sensation intact, coordination intact (has RA in hands but well controlled by medication per pt/dtr  report_)   Lower Extremity Assessment Lower Extremity Assessment: Generalized weakness(grossly 4/5 bilaterally, sensation intact, coordination intact, significant R hip and mild R knee pain so physical exam difficult on RLE)   Cervical / Trunk Assessment Cervical / Trunk Assessment: Normal   Communication Communication Communication: No difficulties   Cognition Arousal/Alertness: Awake/alert Behavior During Therapy: WFL for tasks assessed/performed Overall Cognitive Status: Within Functional Limits for tasks assessed                                 General Comments: mildly decreased safety awareness but overall WFL   General Comments       Exercises Other Exercises Other Exercises: Pt/dtr educated in falls prevention strategies and risk factors for falls.   Shoulder Instructions      Home Living Family/patient expects to be discharged to:: Private residence Living Arrangements: Children(daughter Angela Leonard) Available Help at Discharge: Family;Available 24 hours/day Type of Home: House Home Access: Level entry     Home Layout: One level     Bathroom Shower/Tub: Tub/shower unit;Walk-in shower(has a safety tub)   Bathroom Toilet: Handicapped height     Home Equipment: Cane - single point;Shower seat          Prior Functioning/Environment Level of Independence: Independent with assistive device(s)        Comments: Pt ambulates with SPC occasionally in the community, "furniture walker" at home. Dtr cooks, pt does household tasks. Pt indep with ADL. Dtr reminds pt to take meds (pt organizes), does meal prep, and provides transportation. No falls in past 12 months.        OT Problem List: Decreased strength;Decreased knowledge of use of DME or AE;Decreased activity tolerance;Impaired balance (sitting and/or standing);Pain      OT Treatment/Interventions: Self-care/ADL training;Balance training;Therapeutic activities;DME and/or AE  instruction;Patient/family education;Therapeutic exercise    OT Goals(Current goals can be found in the care plan section) Acute Rehab OT Goals Patient Stated Goal: to go home and have less R hip pain OT Goal Formulation: With patient/family Time For Goal Achievement: 09/29/17 Potential to Achieve Goals: Good ADL Goals Pt Will Perform Lower Body Dressing: with supervision;with set-up;sit to/from stand(AE PRN, reporting R hip pain <5/10) Pt Will Transfer to Toilet: ambulating;regular height toilet;with min guard assist(LRAD for amb, reporting R hip pain <5/10)  OT Frequency: Min 1X/week   Barriers to D/C:            Co-evaluation              AM-PAC PT "6 Clicks" Daily Activity     Outcome Measure Help from another person eating meals?: None Help from another person taking care of personal grooming?: A Little Help from another person toileting, which includes using toliet, bedpan, or urinal?: A Little Help from another person bathing (including washing, rinsing, drying)?: A Lot Help from another person to put on and taking off regular upper body clothing?: A Little Help from another person to put on and taking off regular lower body clothing?: A Lot 6 Click Score: 17   End of Session Equipment Utilized During Treatment: Gait belt;Rolling walker  Activity Tolerance: Patient tolerated treatment well Patient left: in chair;with call [place]/phone within  reach;with chair alarm set;with family/visitor present  OT Visit Diagnosis: Unsteadiness on feet (R26.81);Muscle weakness (generalized) (M62.81);Pain Pain - Right/Left: Right Pain - part of body: Hip                Time: 7124-5809 OT Time Calculation (min): 34 min Charges:  OT General Charges $OT Visit: 1 Visit OT Evaluation $OT Eval Moderate Complexity: 1 Mod OT Treatments $Self Care/Home Management : 8-22 mins  Jeni Salles, MPH, MS, OTR/L ascom [phone] 09/15/17, 4:02 PM

## 2017-09-15 NOTE — Progress Notes (Signed)
Advanced care plan.  Purpose of the Encounter: CODE STATUS  Parties in Attendance: Patient herself  Patient's Decision Capacity: Intact Subjective/Patient's story: Patient is 83 year old with history of hypertension rheumatoid arthritis atrial fibrillation presenting to the hospital with hip pain as well as noted to have UTI.   Objective/Medical story I discussed with the patient regarding CODE STATUS and resuscitation and intubation.  Patient states that she would like everything done at this point and would like to remain a full code   Goals of care determination: Full code    CODE STATUS: Full code   Time spent discussing advanced care planning: 16 minutes

## 2017-09-15 NOTE — ED Notes (Signed)
Pt to MRI at this time.

## 2017-09-15 NOTE — H&P (Signed)
Angela Leonard is an 83 y.o. female.   Chief Complaint: Hip pain HPI: Patient with past medical history of hypertension and rheumatoid arthritis presents to the emergency department complaining of right hip pain.  The patient states that she has been unable to walk due to severe pain in that hip.  She has not fallen.  The pain has gradually become worse over the last week.  In the emergency department imaging of the hip did not reveal any significant effusion or fracture.  Laboratory evaluation also showed acute kidney injury and UTI which prompted the emergency department staff to call the hospitalist service for admission.  Past Medical History:  Diagnosis Date  . Hypertension     Past Surgical History:  Procedure Laterality Date  . BACK SURGERY    . CHOLECYSTECTOMY N/A 10/14/2014   Procedure: LAPAROSCOPIC CHOLECYSTECTOMY;  Surgeon: Marlyce Huge, MD;  Location: [facility] ORS;  Service: General;  Laterality: N/A;  . COLON SURGERY      Family History  Problem Relation Age of Onset  . Alzheimer's disease Mother   . Parkinson's disease Mother   . Cancer Father   . Heart disease Sister   . Parkinson's disease Brother   . Heart disease Brother    Social History:  reports that she has quit smoking. She does not have any smokeless tobacco history on file. She reports that she does not drink alcohol or use drugs.  Allergies: No Known Allergies  Medications Prior to Admission  Medication Sig Dispense Refill  . aspirin EC 81 MG tablet Take 81 mg by mouth at bedtime.    . cholecalciferol (VITAMIN D) 1000 units tablet Take 1,000 Units by mouth daily.    Marland Kitchen diltiazem (TIAZAC) 120 MG 24 hr capsule Take 120 mg by mouth daily.    Marland Kitchen ELIQUIS 5 MG TABS tablet Take 5 mg by mouth every 12 (twelve) hours.    . folic acid (FOLVITE) 1 MG tablet Take 1 mg by mouth daily.    . methotrexate (RHEUMATREX) 2.5 MG tablet Take 6 tablets by mouth every Thursday.    . Multiple Vitamin (MULTIVITAMIN WITH  MINERALS) TABS tablet Take 1 tablet by mouth daily.    . ramipril (ALTACE) 10 MG capsule Take 10 mg by mouth daily.    . simvastatin (ZOCOR) 20 MG tablet Take 20 mg by mouth at bedtime.    . vitamin B-12 (CYANOCOBALAMIN) 1000 MCG tablet Take 1,000 mcg by mouth daily.    Marland Kitchen HYDROcodone-acetaminophen (NORCO/VICODIN) 5-325 MG per tablet Take 1-2 tablets by mouth every 6 (six) hours as needed for severe pain. (Patient not taking: Reported on 09/15/2017) 30 tablet 0    Results for orders placed or performed during the hospital encounter of 09/14/17 (from the past 48 hour(s))  Urinalysis, Complete w Microscopic     Status: Abnormal   Collection Time: 09/15/17 12:10 AM  Result Value Ref Range   Color, Urine AMBER (A) YELLOW    Comment: BIOCHEMICALS MAY BE AFFECTED BY COLOR   APPearance CLOUDY (A) CLEAR   Specific Gravity, Urine 1.021 1.005 - 1.030   pH 5.0 5.0 - 8.0   Glucose, UA NEGATIVE NEGATIVE mg/dL   Hgb urine dipstick MODERATE (A) NEGATIVE   Bilirubin Urine NEGATIVE NEGATIVE   Ketones, ur NEGATIVE NEGATIVE mg/dL   Protein, ur 30 (A) NEGATIVE mg/dL   Nitrite NEGATIVE NEGATIVE   Leukocytes, UA LARGE (A) NEGATIVE   RBC / HPF 6-30 0 - 5 RBC/hpf   WBC, UA TOO NUMEROUS  TO COUNT 0 - 5 WBC/hpf   Bacteria, UA MANY (A) NONE SEEN   Squamous Epithelial / LPF 6-30 (A) NONE SEEN   WBC Clumps PRESENT     Comment: Performed at [facility], [address] [place], [place] [address]  Lactic acid, plasma     Status: None   Collection Time: 09/15/17  1:12 AM  Result Value Ref Range   Lactic Acid, Venous 1.2 0.5 - 1.9 mmol/L    Comment: Performed at [facility], [address] [place], [place] [address]  Culture, blood (routine x 2)     Status: None (Preliminary result)   Collection Time: 09/15/17  1:13 AM  Result Value Ref Range   Specimen Description BLOOD LEFT ANTECUBITAL    Special Requests      BOTTLES DRAWN AEROBIC AND ANAEROBIC Blood Culture results may not be optimal  due to an excessive volume of blood received in culture bottles   Culture      NO GROWTH < 12 HOURS Performed at [facility], [address], [place], [place] [address]    Report Status PENDING   Culture, blood (routine x 2)     Status: None (Preliminary result)   Collection Time: 09/15/17  1:13 AM  Result Value Ref Range   Specimen Description BLOOD LEFT HAND    Special Requests      BOTTLES DRAWN AEROBIC AND ANAEROBIC Blood Culture adequate volume   Culture      NO GROWTH < 12 HOURS Performed at [facility], [address] [place], [place] [address]    Report Status PENDING   CBC with Differential     Status: Abnormal   Collection Time: 09/15/17  1:13 AM  Result Value Ref Range   WBC 15.5 (H) 3.6 - 11.0 K/uL   RBC 4.28 3.80 - 5.20 MIL/uL   Hemoglobin 14.1 12.0 - 16.0 g/dL   HCT 42.3 35.0 - 47.0 %   MCV 98.7 80.0 - 100.0 fL   MCH 33.0 26.0 - 34.0 pg   MCHC 33.4 32.0 - 36.0 g/dL   RDW 15.6 (H) 11.5 - 14.5 %   Platelets 191 150 - 440 K/uL   Neutrophils Relative % 85 %   Neutro Abs 13.2 (H) 1.4 - 6.5 K/uL   Lymphocytes Relative 9 %   Lymphs Abs 1.4 1.0 - 3.6 K/uL   Monocytes Relative 6 %   Monocytes Absolute 0.9 0.2 - 0.9 K/uL   Eosinophils Relative 0 %   Eosinophils Absolute 0.0 0 - 0.7 K/uL   Basophils Relative 0 %   Basophils Absolute 0.0 0 - 0.1 K/uL    Comment: Performed at [facility], [address] [place], [place] [address]  Basic metabolic panel     Status: Abnormal   Collection Time: 09/15/17  1:13 AM  Result Value Ref Range   Sodium 135 135 - 145 mmol/L   Potassium 4.0 3.5 - 5.1 mmol/L   Chloride 104 101 - 111 mmol/L   CO2 23 22 - 32 mmol/L   Glucose, Bld 145 (H) 65 - 99 mg/dL   BUN 32 (H) 6 - 20 mg/dL   Creatinine, Ser 1.23 (H) 0.44 - 1.00 mg/dL   Calcium 9.2 8.9 - 10.3 mg/dL   GFR calc non Af Amer 40 (L) >60 mL/min   GFR calc Af Amer 47 (L) >60 mL/min    Comment: (NOTE) The eGFR has been calculated using the CKD EPI  equation. This  calculation has not been validated in all clinical situations. eGFR's persistently <60 mL/min signify possible Chronic Kidney Disease.    Anion gap 8 5 - 15    Comment: Performed at [facility], [address] [place], [place] [address]  Sedimentation rate     Status: Abnormal   Collection Time: 09/15/17  1:13 AM  Result Value Ref Range   Sed Rate 51 (H) 0 - 30 mm/hr    Comment: Performed at [facility], [address] [place], [place] [address]  TSH     Status: None   Collection Time: 09/15/17  1:13 AM  Result Value Ref Range   TSH 1.490 0.350 - 4.500 uIU/mL    Comment: Performed by a 3rd Generation assay with a functional sensitivity of <=0.01 uIU/mL. Performed at [facility], [address] [place], [place] [address]    Ct Hip Right Wo Contrast  Result Date: 09/15/2017 CLINICAL DATA:  Acute onset of right hip pain, radiating to the knee. EXAM: CT OF THE RIGHT HIP WITHOUT CONTRAST TECHNIQUE: Multidetector CT imaging of the right hip was performed according to the standard protocol. Multiplanar CT image reconstructions were also generated. COMPARISON:  Right hip radiographs performed 09/14/2016 FINDINGS: Bones/Joint/Cartilage There is no evidence of fracture or dislocation. No osseous erosions are seen. The right femoral head remains seated at the acetabulum. A trace right hip joint effusion is noted, with suggestion of mild capsular thickening. Would correlate clinically to exclude infection. There is posterior narrowing of the right hip joint. The cartilage is not well assessed on CT. Ligaments Suboptimally assessed by CT. Muscles and Tendons The visualized musculature is unremarkable in appearance. The visualized tendon structures are unremarkable. Soft tissues Scattered vascular calcifications are seen. The visualized small and large bowel loops are grossly unremarkable. No soft tissue hematoma is seen. IMPRESSION: 1. Trace right hip joint  effusion, with suggestion of mild capsular thickening. Would correlate clinically to exclude infection. 2. No evidence of fracture or dislocation. No osseous erosions seen. 3. Mild degenerative posterior narrowing of the right hip joint. 4. Scattered vascular calcifications seen. Electronically Signed   By: Garald Balding M.D.   On: 09/15/2017 00:50   Mr Hip Right Wo Contrast  Result Date: 09/15/2017 CLINICAL DATA:  83 year old with right hip and thigh pain. No known injury or prior relevant surgery. EXAM: MR OF THE RIGHT HIP WITHOUT CONTRAST TECHNIQUE: Multiplanar, multisequence MR imaging was performed. No intravenous contrast was administered. COMPARISON:  CT of the right hip 09/15/2017. Abdominal MRI 10/13/2014. FINDINGS: Bones: There is no evidence of acute fracture, dislocation or avascular necrosis. The visualized bony pelvis appears normal. The visualized sacroiliac joints and symphysis pubis appear normal. Articular cartilage and labrum Articular cartilage: There are asymmetric right hip degenerative changes with femoral head osteophytes. No focal chondral defect or subchondral edema demonstrated. Labrum: There is no gross labral tear or paralabral abnormality. Joint or bursal effusion Joint effusion: As seen on CT, there is a small right hip joint effusion. Bursae: No focal periarticular fluid collection. Muscles and tendons Muscles and tendons: There is mild asymmetric edema within the right obturator externus and superior aspect of the right gluteus minimus muscles. There is minimal edema tracking between the right iliacus muscle and the right iliac bone. The hamstring, gluteus and iliopsoas tendons are intact. Other findings Miscellaneous: There is a right paramedian abdominal wall hernia containing a portion of the transverse colon. Diverticular changes are present throughout the visualized colon. No evidence of bowel  obstruction or incarceration. Mild subcutaneous edema within the anterior  abdominal wall bilaterally. There is T2 hyperintensity within the endometrial canal which is distended to 1.8 cm. IMPRESSION: 1. No acute osseous findings. 2. Asymmetric right hip degenerative changes with small right hip joint effusion, nonspecific. There is no subchondral edema or periarticular inflammation to suggest infection. 3. Mild edema within the right obturator externus and gluteus minimus muscles, likely strain. 4. Abnormal distended, fluid-filled endometrial canal. This is partially visualized on previous abdominal MRI and likely due to cervical stenosis given relative stability. However, pelvic ultrasound should be considered, especially if the patient has any vaginal bleeding. 5. Right abdominal wall hernia containing transverse colon. No evidence of bowel obstruction. 6. A preliminary report of these findings was issued by Dr. Dixon Boos at [phone] hours on 09/15/2017 and discussed with Dr. Beather Arbour. I called the endometrial canal findings to Dr. Conni Slipper at [phone] hours. Electronically Signed   By: Richardean Sale M.D.   On: 09/15/2017 07:24   Dg Hip Unilat With Pelvis 2-3 Views Right  Result Date: 09/14/2017 CLINICAL DATA:  Left hip and leg pain. EXAM: DG HIP (WITH OR WITHOUT PELVIS) 2-3V RIGHT COMPARISON:  None. FINDINGS: Degenerative changes in the hips bilaterally with joint space narrowing and spurring. SI joints are symmetric and unremarkable. No acute bony abnormality. Specifically, no fracture, subluxation, or dislocation. Vascular calcifications noted. IMPRESSION: Mild to moderate symmetric degenerative changes in the hips. No acute bony abnormality. Electronically Signed   By: Rolm Baptise M.D.   On: 09/14/2017 21:22    Review of Systems  Constitutional: Negative for chills and fever.  HENT: Negative for sore throat and tinnitus.   Eyes: Negative for blurred vision and redness.  Respiratory: Negative for cough and shortness of breath.   Cardiovascular: Negative for chest pain,  palpitations, orthopnea and PND.  Gastrointestinal: Negative for abdominal pain, diarrhea, nausea and vomiting.  Genitourinary: Negative for dysuria, frequency and urgency.  Musculoskeletal: Positive for joint pain. Negative for myalgias.  Skin: Negative for rash.       No lesions  Neurological: Negative for speech change, focal weakness and weakness.  Endo/Heme/Allergies: Does not bruise/bleed easily.       No temperature intolerance  Psychiatric/Behavioral: Negative for depression and suicidal ideas.    Blood pressure 104/74, pulse 90, temperature 98 F (36.7 C), temperature source Oral, resp. rate 17, height 5' 3"  (1.6 m), weight 85.3 kg (188 lb), SpO2 94 %. Physical Exam  Vitals reviewed. Constitutional: She is oriented to person, place, and time. She appears well-developed and well-nourished. No distress.  HENT:  Head: Normocephalic and atraumatic.  Mouth/Throat: Oropharynx is clear and moist.  Eyes: Pupils are equal, round, and reactive to light. Conjunctivae and EOM are normal. No scleral icterus.  Neck: Normal range of motion. Neck supple. No JVD present. No tracheal deviation present. No thyromegaly present.  Cardiovascular: Normal rate, regular rhythm and normal heart sounds. Exam reveals no gallop and no friction rub.  No murmur heard. Respiratory: Effort normal and breath sounds normal.  GI: Soft. Bowel sounds are normal. She exhibits no distension. There is no tenderness.  Genitourinary:  Genitourinary Comments: Deferred  Musculoskeletal: Normal range of motion. She exhibits no edema.  Lymphadenopathy:    She has no cervical adenopathy.  Neurological: She is alert and oriented to person, place, and time. No cranial nerve deficit. She exhibits normal muscle tone.  Skin: Skin is warm and dry. No rash noted. No erythema.  Psychiatric: She has a  normal mood and affect. Her behavior is normal. Judgment and thought content normal.     Assessment/Plan This is a 84 year old  female admitted for acute kidney injury. 1.  Acute kidney injury: Treat with intravenous fluid.  Avoid nephrotoxic agents. 2.  Rheumatoid arthritis: Right hip pain; currently on methotrexate.  We will consult rheumatology for initiation of prednisone and/or other immune modulator.  PT/OT for evaluation prior to discharge 3.  Hypertension: Controlled; continue ACE inhibitor. 4.  UTI: Present on admission; continue ceftriaxone 5.  Atrial fibrillation: Rate controlled; continue diltiazem and Eliquis 6.  Hyperlipidemia: Continue statin therapy 7.  DVT prophylaxis: Full dose anticoagulation 8.  GI prophylaxis: None The patient is a full code.  Time spent on admission orders and patient care approximately 45 minutes  Harrie Foreman, MD 09/15/2017, 7:51 AM

## 2017-09-15 NOTE — Evaluation (Signed)
Physical Therapy Evaluation Patient Details Name: Angela Leonard MRN: [id number] DOB: Nov 16, 1934 Today's Date: 09/15/2017   History of Present Illness  presented to ER secondary to worsening R hip pain; noted with/admitted for UTI, AKI.  Per ortho consult R hip pain likely related to arthritic flare (non-traumatic) or muscle strain; recommended for conservative management.  Clinical Impression  Upon evaluation, patient alert and oriented.  Agreeable to session, but fearful/hesitant of movement due to R hip pain (though rated 2-3/10 throughout session despite activity/WBing).  Very guarded in act assist (supine) ROM to R hip, but with functional activity/movement transitions, tolerates ROM functional for transfers and gait.  Currently requiring min/mod assist for bed mobility; min assist for sit/stand, basic transfers and short-distance gait (87') with RW.  Stepping performance, R LE stance time/WBing improved with distance; effortful and guarded, but no overt buckling or LOB noted.  Do anticipate continued use of RW for all mobility at this time.  Anticipate continued improvement in performance and activity tolerance with additional pain control to R LE. Would benefit from skilled PT to address above deficits and promote optimal return to PLOF; Recommend transition to [place] upon discharge from acute hospitalization.  Patient preferring, requesting outpatient PT instead.     Follow Up Recommendations Home health PT(patient preferring, requesting outpatient PT)    Equipment Recommendations  Rolling walker with 5" wheels    Recommendations for Other Services       Precautions / Restrictions Precautions Precautions: Fall Restrictions Weight Bearing Restrictions: No      Mobility  Bed Mobility Overal bed mobility: Needs Assistance Bed Mobility: Supine to Sit     Supine to sit: Min assist;Mod assist     General bed mobility comments: transition towards L side of bed, min/mod assist for  LE management, truncal elevation  Transfers Overall transfer level: Needs assistance Equipment used: Rolling walker (2 wheeled) Transfers: Sit to/from Stand Sit to Stand: Min assist         General transfer comment: cuing for hand placement; R LE slightly anterior to BOS, but fair WBing with movement transition  Ambulation/Gait Ambulation/Gait assistance: Min assist;+2 safety/equipment(chair follow for safety due to pain) Ambulation Distance (Feet): 60 Feet Assistive device: Rolling walker (2 wheeled)       General Gait Details: initial step to gait pattern with very brief stance time/weight acceptance R LE; however, improved with continued effort/distance to step through with fair WBing/stance time.  Slow and effortful ,but no overt buckling or LOB  Stairs            Wheelchair Mobility    Modified Rankin (Stroke Patients Only)       Balance Overall balance assessment: Needs assistance Sitting-balance support: No upper extremity supported;Feet supported Sitting balance-[name] Scale: Good     Standing balance support: Bilateral upper extremity supported Standing balance-[name] Scale: Poor                               Pertinent Vitals/Pain Pain Assessment: 0-10 Pain Score: 2  Pain Location: R hip Pain Descriptors / Indicators: Aching;Guarding Pain Intervention(s): Limited activity within patient's tolerance;Monitored during session;Repositioned;Patient requesting pain meds-RN notified    Home Living Family/patient expects to be discharged to:: Private residence Living Arrangements: Children Available Help at Discharge: Family;Available 24 hours/day Type of Home: House Home Access: Level entry     Home Layout: One level Home Equipment: Cane - single point;Shower seat      Prior  Function Level of Independence: Independent with assistive device(s)         Comments: Pt ambulates with SPC occasionally in the community, "furniture walker" at  home. Dtr cooks, pt does household tasks. Pt indep with ADL. Dtr reminds pt to take meds (pt organizes), does meal prep, and provides transportation. No falls in past 12 months.     Hand Dominance   Dominant Hand: Right    Extremity/Trunk Assessment   Upper Extremity Assessment Upper Extremity Assessment: (grossly at least 4/5 bilat; functional for all self-care, mobility needs)    Lower Extremity Assessment Lower Extremity Assessment: Generalized weakness(R hip grossly 3-/5, limited by pain (tolerating only 30-40 degrees act assist flexion), otherwise knee and ankle at least 4/5; L LE grossly 4+/5.  Denies sensory deficit bilat)    Cervical / Trunk Assessment Cervical / Trunk Assessment: Normal  Communication   Communication: No difficulties  Cognition Arousal/Alertness: Awake/alert Behavior During Therapy: WFL for tasks assessed/performed Overall Cognitive Status: Within Functional Limits for tasks assessed                                 General Comments: mildly decreased safety awareness but overall WFL      General Comments      Exercises Other Exercises Other Exercises: Sit/stand x3 with RW, min assist-improved comfort, confidence with repetition Other Exercises: Bed/chair with RW, min/mod assist-cuing for walker management, movement sequencing   Assessment/Plan    PT Assessment Patient needs continued PT services  PT Problem List Decreased strength;Decreased activity tolerance;Decreased mobility;Decreased range of motion;Decreased balance;Decreased knowledge of use of DME;Decreased knowledge of precautions;Decreased safety awareness;Pain       PT Treatment Interventions DME instruction;Gait training;Therapeutic activities;Therapeutic exercise;Balance training;Patient/family education    PT Goals (Current goals can be found in the Care Plan section)  Acute Rehab PT Goals Patient Stated Goal: to improve pain PT Goal Formulation: With patient Time  For Goal Achievement: 09/29/17 Potential to Achieve Goals: Good    Frequency Min 2X/week   Barriers to discharge        Co-evaluation               AM-PAC PT "6 Clicks" Daily Activity  Outcome Measure Difficulty turning over in bed (including adjusting bedclothes, sheets and blankets)?: Unable Difficulty moving from lying on back to sitting on the side of the bed? : Unable Difficulty sitting down on and standing up from a chair with arms (e.g., wheelchair, bedside commode, etc,.)?: Unable Help needed moving to and from a bed to chair (including a wheelchair)?: A Little Help needed walking in hospital room?: A Little Help needed climbing 3-5 steps with a railing? : A Lot 6 Click Score: 11    End of Session Equipment Utilized During Treatment: Gait belt Activity Tolerance: Patient tolerated treatment well;Patient limited by pain Patient left: with chair alarm set;in chair;with nursing/sitter in room;with call [place]/phone within reach Nurse Communication: Mobility status PT Visit Diagnosis: Muscle weakness (generalized) (M62.81);Pain;Difficulty in walking, not elsewhere classified (R26.2) Pain - Right/Left: Right Pain - part of body: Hip    Time: 1022-1052 PT Time Calculation (min) (ACUTE ONLY): 30 min   Charges:   PT Evaluation $PT Eval Moderate Complexity: 1 Mod PT Treatments $Therapeutic Activity: 8-22 mins   PT G Codes:        Seini Lannom H. Owens Shark, PT, DPT, NCS 09/15/17, 5:32 PM [phone]

## 2017-09-15 NOTE — Consult Note (Signed)
Reason for Consult: Rheumatoid arthritis  Referring Physician: Hospitalist  Angela Leonard   HPI: 83 year old white female.  About a year ago she developed hand pain.  Found to have rheumatoid arthritis.  Positive rheumatoid factor and anti-CCP antibodies.  She was placed on methotrexate and then Remicade.  Last received Remicade in February For the last few days she has had pain in the right lower quadrant in the right groin.  Advanced to the point where she could not bear weight without significant pain.  She had no fever.  No rash.  No diarrhea or GI upset. Presented the emergency room.  Had CT scan with small amount of fluid in arthritic right hip.  She had MRI that showed abdominal wall hernia as well as fluid in the uterus and some edema of the musculature surrounding the hip.  She was given analgesics.  She is seen by orthopedics.  She was ordered prednisone 50 White blood cell count was elevated at 15,000.  She was not anemic.  She did have significant pyuria.  She had had urine infection and January with E. coli.  She has been given IV Rocephin and analgesics.  Creatinine at 1.2  PMH: Rheumatoid arthritis.  Anticoagulation for atrial fibrillation.  On diltiazem.  SURGICAL HISTORY: Cholecystectomy.  Spine surgery.  Family History: Negative for rheumatoid arthritis  Social History: No cigarettes or alcohol.  Allergies: No Known Allergies  Medications:  Scheduled: . apixaban  5 mg Oral Q12H  . aspirin EC  81 mg Oral QHS  . atorvastatin  10 mg Oral q1800  . cholecalciferol  1,000 Units Oral Daily  . diltiazem  120 mg Oral Daily  . docusate sodium  100 mg Oral BID  . folic acid  1 mg Oral Daily  . ketorolac  15 mg Intravenous Q8H  . [START ON 09/18/2017] methotrexate  15 mg Oral Q Thu  . multivitamin with minerals  1 tablet Oral Daily  . [START ON 09/16/2017] predniSONE  50 mg Oral Q breakfast  . ramipril  10 mg Oral Daily  . vitamin B-12  1,000 mcg Oral Daily        ROS:  No rash.  No chills.  No fever.  No diarrhea.  Constipation just today.  No vomiting   PHYSICAL EXAM: Blood pressure (!) 115/50, pulse 90, temperature 98 F (36.7 C), temperature source Oral, resp. rate 17, height 5\' 3"  (1.6 m), weight 85.3 kg (188 lb), SpO2 94 %. Pleasant female.  Lying in bed.  Sclera clear.  Clear chest.  Regular rhythm.  Has abdominal bowel sounds.  Abdominal hernia on the right.  Very tender to palpate in the right lower quadrant.  Left lower quadrant nontender. Musculoskeletal does not want to do hip roll.  Does have pain with flexion and rotation of the hip on the right.  Not so much on the left.  Knee with without synovitis Hands without synovitis.  Shoulders move well.  MTPs nontender.  Ankles move well  Assessment: Significant right hip and right lower quadrant abdominal pelvic pain.  Does not want to flex the hip.  Or hip roll the hip. MRI and CT showed mild amount of fluid but also edema around the hip as well as abdominal wall hernia and fluid in the uterus.  Urine has significant white blood cells.   This does not appear to be a typical rheumatoid flare.  Other joints are quiet.  She has elevated white count sed rate and CRP.  Would worry  about infection as a culprit.  Cannot completely rule out septic hip  Recommendations: Recommend discontinue prednisone.  If this is infection.,  Would not want to have prednisone as an immunosuppression.  She is already had Remicade which make would make her more prone to infection If right hip pain is unimproved may need to hold her anticoagulant and consider CT-guided right hip aspiration. If right lower quadrant abdominal pain is unimproved with her abdominal wall hernia, may need follow-up CT of the abdomen/pelvis  Angela Leonard 09/15/2017, 5:02 PM

## 2017-09-15 NOTE — ED Notes (Signed)
Pt repositioned in bed x2 for comfort.

## 2017-09-16 LAB — BASIC METABOLIC PANEL
ANION GAP: 4 — AB (ref 5–15)
BUN: 33 mg/dL — AB (ref 6–20)
CO2: 22 mmol/L (ref 22–32)
Calcium: 8.4 mg/dL — ABNORMAL LOW (ref 8.9–10.3)
Chloride: 110 mmol/L (ref 101–111)
Creatinine, Ser: 1.05 mg/dL — ABNORMAL HIGH (ref 0.44–1.00)
GFR calc non Af Amer: 49 mL/min — ABNORMAL LOW (ref >60)
GFR, EST AFRICAN AMERICAN: 57 mL/min — AB (ref >60)
Glucose, Bld: 104 mg/dL — ABNORMAL HIGH (ref 65–99)
POTASSIUM: 4.1 mmol/L (ref 3.5–5.1)
Sodium: 136 mmol/L (ref 135–145)

## 2017-09-16 LAB — CBC
HCT: 36.3 % (ref 35.0–47.0)
Hemoglobin: 12.2 g/dL (ref 12.0–16.0)
MCH: 33.7 pg (ref 26.0–34.0)
MCHC: 33.5 g/dL (ref 32.0–36.0)
MCV: 100.7 fL — ABNORMAL HIGH (ref 80.0–100.0)
PLATELETS: 152 10*3/uL (ref 150–440)
RBC: 3.61 MIL/uL — AB (ref 3.80–5.20)
RDW: 15.8 % — ABNORMAL HIGH (ref 11.5–14.5)
WBC: 10.8 10*3/uL (ref 3.6–11.0)

## 2017-09-16 NOTE — Consult Note (Signed)
Rheumatology follow-up Pain is much better.  No fever.  Able to sit on the side of the bed and walk to the bedside commode.  White count is now normal.  No nausea or vomiting.  Exam: Afebrile.  Right lower quadrant pain though less than last night.  Mild decrease internal X rotation the right hip.  Still some pain in elevating the right hip off the bed.  Impression: Atypical pain.  Some of the pain is in the iliopsoas.  Does not appear to have a septic joint.  Does have right lower quadrant pain around the area of her abdominal wall hernia.  No nausea or vomiting.  Did have bowel movement.  No obvious obstruction UTI on antibiotic  Recommendations: Would stay off prednisone.  Anti-inflammatories as needed.  May need further evaluation down the road of her abdominal wall hernia.  Follow-up in the office.  Sees Dr. [name].

## 2017-09-16 NOTE — Progress Notes (Signed)
[place] [facility]                                                                                                                                                                                  Patient Demographics   Angela Leonard, is a 83 y.o. female, DOB - [birth year], XLK:[id number]  Admit date - 09/14/2017   Admitting Physician Harrie Foreman, MD  Outpatient Primary MD for the patient is Derinda Late, MD   LOS - 1  Subjective: Patient is hip pain much better, abdominal pain resolved   Review of Systems:   CONSTITUTIONAL: No documented fever. No fatigue, weakness. No weight gain, no weight loss.  EYES: No blurry or double vision.  ENT: No tinnitus. No postnasal drip. No redness of the oropharynx.  RESPIRATORY: No cough, no wheeze, no hemoptysis. No dyspnea.  CARDIOVASCULAR: No chest pain. No orthopnea. No palpitations. No syncope.  GASTROINTESTINAL: No nausea, no vomiting or diarrhea. No abdominal pain. No melena or hematochezia.  GENITOURINARY: No dysuria or hematuria.  ENDOCRINE: No polyuria or nocturia. No heat or cold intolerance.  HEMATOLOGY: No anemia. No bruising. No bleeding.  INTEGUMENTARY: No rashes. No lesions.  MUSCULOSKELETAL: No arthritis. No swelling. No gout.  Positive hip pain NEUROLOGIC: No numbness, tingling, or ataxia. No seizure-type activity.  PSYCHIATRIC: No anxiety. No insomnia. No ADD.    Vitals:   Vitals:   09/15/17 0937 09/16/17 0013 09/16/17 0500 09/16/17 0805  BP: (!) 115/50 (!) 119/54  121/62  Pulse:  93  81  Resp:  18    Temp:  98.7 F (37.1 C)  (!) 97.5 F (36.4 C)  TempSrc:  Oral  Axillary  SpO2:  98%  96%  Weight:   87.2 kg (192 lb 3.9 oz)   Height:        Wt Readings from Last 3 Encounters:  09/16/17 87.2 kg (192 lb 3.9 oz)  10/13/14 78.5 kg (173 lb)  10/13/14 78.5 kg (173 lb)     Intake/Output Summary (Last 24 hours) at 09/16/2017 1240 Last data filed at 09/16/2017 0904 Gross per 24  hour  Intake 3236.67 ml  Output -  Net 3236.67 ml    Physical Exam:   GENERAL: Pleasant-appearing in no apparent distress.  HEAD, EYES, EARS, NOSE AND THROAT: Atraumatic, normocephalic. Extraocular muscles are intact. Pupils equal and reactive to light. Sclerae anicteric. No conjunctival injection. No oro-pharyngeal erythema.  NECK: Supple. There is no jugular venous distention. No bruits, no lymphadenopathy, no thyromegaly.  HEART: Regular rate and rhythm,. No murmurs, no rubs, no clicks.  LUNGS: Clear to auscultation bilaterally. No rales or rhonchi. No wheezes.  ABDOMEN: Soft, flat,  nontender, nondistended. Has good bowel sounds. No hepatosplenomegaly appreciated.  EXTREMITIES: No evidence of any cyanosis, clubbing, or peripheral edema.  +2 pedal and radial pulses bilaterally.  NEUROLOGIC: The patient is alert, awake, and oriented x3 with no focal motor or sensory deficits appreciated bilaterally.  SKIN: Moist and warm with no rashes appreciated.  Psych: Not anxious, depressed LN: No inguinal LN enlargement    Antibiotics   Anti-infectives (From admission, onward)   Start     Dose/Rate Route Frequency Ordered Stop   09/15/17 1800  cefTRIAXone (ROCEPHIN) 1 g in sodium chloride 0.9 % 100 mL IVPB     1 g 200 mL/hr over 30 Minutes Intravenous Every 24 hours 09/15/17 1012     09/15/17 0100  cefTRIAXone (ROCEPHIN) 1 g in sodium chloride 0.9 % 100 mL IVPB     1 g 200 mL/hr over 30 Minutes Intravenous  Once 09/15/17 0056 09/15/17 0322      Medications   Scheduled Meds: . apixaban  5 mg Oral Q12H  . aspirin EC  81 mg Oral QHS  . atorvastatin  10 mg Oral q1800  . cholecalciferol  1,000 Units Oral Daily  . diltiazem  120 mg Oral Daily  . docusate sodium  100 mg Oral BID  . folic acid  1 mg Oral Daily  . ketorolac  15 mg Intravenous Q8H  . [START ON 09/18/2017] methotrexate  15 mg Oral Q Thu  . multivitamin with minerals  1 tablet Oral Daily  . predniSONE  50 mg Oral Q breakfast   . ramipril  10 mg Oral Daily  . vitamin B-12  1,000 mcg Oral Daily   Continuous Infusions: . cefTRIAXone (ROCEPHIN)  IV Stopped (09/15/17 1812)   PRN Meds:.acetaminophen **OR** acetaminophen, ondansetron **OR** ondansetron (ZOFRAN) IV   Data Review:   Micro Results Recent Results (from the past 240 hour(s))  Urine culture     Status: Abnormal (Preliminary result)   Collection Time: 09/15/17 12:10 AM  Result Value Ref Range Status   Specimen Description   Final    URINE, RANDOM Performed at [facility], [address] [place], [place] [address]    Special Requests   Final    NONE Performed at [facility], [address] [place], [place] [address]    Culture (A)  Final    >=100,000 COLONIES/mL CITROBACTER FREUNDII SUSCEPTIBILITIES TO FOLLOW Performed at [facility], [place] [address], [place], [place] [address]    Report Status PENDING  Incomplete  Culture, blood (routine x 2)     Status: None (Preliminary result)   Collection Time: 09/15/17  1:13 AM  Result Value Ref Range Status   Specimen Description BLOOD LEFT ANTECUBITAL  Final   Special Requests   Final    BOTTLES DRAWN AEROBIC AND ANAEROBIC Blood Culture results may not be optimal due to an excessive volume of blood received in culture bottles   Culture   Final    NO GROWTH 1 DAY Performed at [facility], [address] [place],  [address]    Report Status PENDING  Incomplete  Culture, blood (routine x 2)     Status: None (Preliminary result)   Collection Time: 09/15/17  1:13 AM  Result Value Ref Range Status   Specimen Description BLOOD LEFT HAND  Final   Special Requests   Final    BOTTLES DRAWN AEROBIC AND ANAEROBIC Blood Culture adequate volume   Culture   Final    NO  GROWTH 1 DAY Performed at [facility], [address] [place], [place] [address]    Report Status PENDING  Incomplete    Radiology Reports Ct Hip Right Wo Contrast  Result  Date: 09/15/2017 CLINICAL DATA:  Acute onset of right hip pain, radiating to the knee. EXAM: CT OF THE RIGHT HIP WITHOUT CONTRAST TECHNIQUE: Multidetector CT imaging of the right hip was performed according to the standard protocol. Multiplanar CT image reconstructions were also generated. COMPARISON:  Right hip radiographs performed 09/14/2016 FINDINGS: Bones/Joint/Cartilage There is no evidence of fracture or dislocation. No osseous erosions are seen. The right femoral head remains seated at the acetabulum. A trace right hip joint effusion is noted, with suggestion of mild capsular thickening. Would correlate clinically to exclude infection. There is posterior narrowing of the right hip joint. The cartilage is not well assessed on CT. Ligaments Suboptimally assessed by CT. Muscles and Tendons The visualized musculature is unremarkable in appearance. The visualized tendon structures are unremarkable. Soft tissues Scattered vascular calcifications are seen. The visualized small and large bowel loops are grossly unremarkable. No soft tissue hematoma is seen. IMPRESSION: 1. Trace right hip joint effusion, with suggestion of mild capsular thickening. Would correlate clinically to exclude infection. 2. No evidence of fracture or dislocation. No osseous erosions seen. 3. Mild degenerative posterior narrowing of the right hip joint. 4. Scattered vascular calcifications seen. Electronically Signed   By: Garald Balding M.D.   On: 09/15/2017 00:50   Mr Hip Right Wo Contrast  Result Date: 09/15/2017 CLINICAL DATA:  83 year old with right hip and thigh pain. No known injury or prior relevant surgery. EXAM: MR OF THE RIGHT HIP WITHOUT CONTRAST TECHNIQUE: Multiplanar, multisequence MR imaging was performed. No intravenous contrast was administered. COMPARISON:  CT of the right hip 09/15/2017. Abdominal MRI 10/13/2014. FINDINGS: Bones: There is no evidence of acute fracture, dislocation or avascular necrosis. The visualized  bony pelvis appears normal. The visualized sacroiliac joints and symphysis pubis appear normal. Articular cartilage and labrum Articular cartilage: There are asymmetric right hip degenerative changes with femoral head osteophytes. No focal chondral defect or subchondral edema demonstrated. Labrum: There is no gross labral tear or paralabral abnormality. Joint or bursal effusion Joint effusion: As seen on CT, there is a small right hip joint effusion. Bursae: No focal periarticular fluid collection. Muscles and tendons Muscles and tendons: There is mild asymmetric edema within the right obturator externus and superior aspect of the right gluteus minimus muscles. There is minimal edema tracking between the right iliacus muscle and the right iliac bone. The hamstring, gluteus and iliopsoas tendons are intact. Other findings Miscellaneous: There is a right paramedian abdominal wall hernia containing a portion of the transverse colon. Diverticular changes are present throughout the visualized colon. No evidence of bowel obstruction or incarceration. Mild subcutaneous edema within the anterior abdominal wall bilaterally. There is T2 hyperintensity within the endometrial canal which is distended to 1.8 cm. IMPRESSION: 1. No acute osseous findings. 2. Asymmetric right hip degenerative changes with small right hip joint effusion, nonspecific. There is no subchondral edema or periarticular inflammation to suggest infection. 3. Mild edema within the right obturator externus and gluteus minimus muscles, likely strain. 4. Abnormal distended, fluid-filled endometrial canal. This is partially visualized on previous abdominal MRI and likely due to cervical stenosis given relative stability. However, pelvic ultrasound should be considered, especially if the patient has any vaginal bleeding. 5. Right abdominal wall hernia containing transverse colon. No evidence of bowel obstruction. 6. A preliminary  report of these findings was  issued by Dr. Dixon Boos at [phone] hours on 09/15/2017 and discussed with Dr. Beather Arbour. I called the endometrial canal findings to Dr. Conni Slipper at [phone] hours. Electronically Signed   By: Richardean Sale M.D.   On: 09/15/2017 07:24   Dg Hip Unilat With Pelvis 2-3 Views Right  Result Date: 09/14/2017 CLINICAL DATA:  Left hip and leg pain. EXAM: DG HIP (WITH OR WITHOUT PELVIS) 2-3V RIGHT COMPARISON:  None. FINDINGS: Degenerative changes in the hips bilaterally with joint space narrowing and spurring. SI joints are symmetric and unremarkable. No acute bony abnormality. Specifically, no fracture, subluxation, or dislocation. Vascular calcifications noted. IMPRESSION: Mild to moderate symmetric degenerative changes in the hips. No acute bony abnormality. Electronically Signed   By: Rolm Baptise M.D.   On: 09/14/2017 21:22     CBC Recent Labs  Lab 09/15/17 0113 09/16/17 0415  WBC 15.5* 10.8  HGB 14.1 12.2  HCT 42.3 36.3  PLT 191 152  MCV 98.7 100.7*  MCH 33.0 33.7  MCHC 33.4 33.5  RDW 15.6* 15.8*  LYMPHSABS 1.4  --   MONOABS 0.9  --   EOSABS 0.0  --   BASOSABS 0.0  --     Chemistries  Recent Labs  Lab 09/15/17 0113 09/16/17 0415  NA 135 136  K 4.0 4.1  CL 104 110  CO2 23 22  GLUCOSE 145* 104*  BUN 32* 33*  CREATININE 1.23* 1.05*  CALCIUM 9.2 8.4*   ------------------------------------------------------------------------------------------------------------------ estimated creatinine clearance is 44.7 mL/min (A) (by C-G formula based on SCr of 1.05 mg/dL (H)). ------------------------------------------------------------------------------------------------------------------ No results for input(s): HGBA1C in the last 72 hours. ------------------------------------------------------------------------------------------------------------------ No results for input(s): CHOL, HDL, LDLCALC, TRIG, CHOLHDL, LDLDIRECT in the last 72  hours. ------------------------------------------------------------------------------------------------------------------ Recent Labs    09/15/17 0113  TSH 1.490   ------------------------------------------------------------------------------------------------------------------ No results for input(s): VITAMINB12, FOLATE, FERRITIN, TIBC, IRON, RETICCTPCT in the last 72 hours.  Coagulation profile No results for input(s): INR, PROTIME in the last 168 hours.  No results for input(s): DDIMER in the last 72 hours.  Cardiac Enzymes No results for input(s): CKMB, TROPONINI, MYOGLOBIN in the last 168 hours.  Invalid input(s): CK ------------------------------------------------------------------------------------------------------------------ Invalid input(s): [place]   This is a 83 year old female admitted for acute kidney injury. 1.  Acute kidney injury: resolved with IVF  2.   Right hip pain; seen by orthopedics no hip fracture noted on MRI Muscle strain noted we  Continue NSAIDs and prednisone 3.   UTI: Present on admission; continue ceftriaxone follow urine cultures 4.  Hypertension: Controlled; continue ACE inhibitor. 5.  Atrial fibrillation: Rate controlled; continue diltiazem and Eliquis 6.  Hyperlipidemia: Continue statin therapy 7.  DVT prophylaxis: Full dose anticoagulation 8.  GI prophylaxis: None       Code Status Orders  (From admission, onward)        Start     Ordered   09/15/17 0621  Full code  Continuous     09/15/17 0620    Code Status History    Date Active Date Inactive Code Status Order ID Comments User Context   10/13/2014 0556 10/15/2014 2057 Full Code [id number]  Molly Maduro, MD ED    Advance Directive Documentation     Most Recent Value  Type of Advance Directive  Healthcare Power of Attorney  Pre-existing out of facility DNR order (yellow form or pink MOST form)  -  "MOST" Form in Place?  -  Consults  orthopedic  DVT Prophylaxis Eliquis  Lab Results  Component Value Date   PLT 152 09/16/2017     Time Spent in minutes   45 minutes greater than 50% of time spent in care coordination and counseling patient regarding the condition and plan of care.   Dustin Flock M.D on 09/16/2017 at 12:40 PM  Between 7am to 6pm - Pager - [phone]  After 6pm go to [email or web address] - Proofreader  [employer]   Office  [phone]

## 2017-09-16 NOTE — Progress Notes (Signed)
Subjective: She reports that her pain is significantly improved today.  She is able to sit upright and states that she is able to walk in her room today.  She rates her pain as a 4 out of 10.  Pain is still located in the groin region as well as over her hip flexors.  Objective: Vital signs in last 24 hours: Temp:  [97.5 F (36.4 C)-98.7 F (37.1 C)] 97.6 F (36.4 C) (04/23 1623) Pulse Rate:  [80-93] 80 (04/23 1623) Resp:  [18] 18 (04/23 0013) BP: (119-125)/(54-70) 125/70 (04/23 1623) SpO2:  [96 %-98 %] 97 % (04/23 1623) Weight:  [87.2 kg (192 lb 3.9 oz)] 87.2 kg (192 lb 3.9 oz) (04/23 0500)  Intake/Output from previous day: 04/22 0701 - 04/23 0700 In: 3245 [P.O.:960; I.V.:2285] Out: -  Intake/Output this shift: Total I/O In: 471.7 [P.O.:240; I.V.:231.7] Out: -   Recent Labs    09/15/17 0113 09/16/17 0415  HGB 14.1 12.2   Recent Labs    09/15/17 0113 09/16/17 0415  WBC 15.5* 10.8  RBC 4.28 3.61*  HCT 42.3 36.3  PLT 191 152   Recent Labs    09/15/17 0113 09/16/17 0415  NA 135 136  K 4.0 4.1  CL 104 110  CO2 23 22  BUN 32* 33*  CREATININE 1.23* 1.05*  GLUCOSE 145* 104*  CALCIUM 9.2 8.4*   No results for input(s): LABPT, INR in the last 72 hours.  Physical Exam: RLE: 5/5 DF/PF/EHL SILT s/s/t/sp/dp distr Foot wwp Hip ROM: 90FF, 20 ER/10 IR without significant pain Mild TTP over hip flexors   Assessment/Plan: 83 year old female with resolving R hip pain likely due to arthritic flare and/or muscle strain.  There is no concern for septic hip.  1.  Would recommend further continued conservative with NSAIDs and Tylenol 1000 mg every 8 hours scheduled.  2.  Continue to treat UTI with antibiotics.  3.  Follow-up as an outpatient as needed.  Please page with any questions.     Leim Fabry 09/16/2017, 6:20 PM

## 2017-09-17 LAB — URINE CULTURE: Culture: >=100000 — AB

## 2017-09-17 MED ORDER — CIPROFLOXACIN HCL 500 MG PO TABS: 500.0000 mg | ORAL_TABLET | Freq: Two times a day (BID) | ORAL | 0 refills | Status: AC

## 2017-09-17 NOTE — Discharge Summary (Signed)
[place] at [facility], 83 y.o., DOB [birth year], MRN [id number]. Admission date: 09/14/2017 Discharge Date 09/17/2017 Primary MD Derinda Late, MD Admitting Physician Harrie Foreman, MD  Admission Diagnosis  Pain [R52] Right hip pain [M25.551] Intractable pain [R52] Unable to ambulate [R26.2] Urinary tract infection without hematuria, site unspecified [N39.0]  Discharge Diagnosis   Active Problems:  Acute kidney injury Right hip pain UTI Hypertension Atrial fibrillation Hyperlipidemia RA   Hospital Course Patient with past medical history of hypertension and rheumatoid arthritis presents to the emergency department complaining of right hip pain.The patient states that she has been unable to walk due to severe pain in that hip. Xray showed no fracture.  Patient was seen by orthopedic surgery.  They evaluated the patient and she underwent MRI which showed again no fracture.  She was noted to have some inflammation in the muscles.  Patient also was seen by rheumatology.  She was treated with NSAIDs and steroids with significant improvement in her symptoms.  She also was noted to have a urinary tract infection which was treated with antibiotics.  Patient is doing much better and is stable for discharge to home.               Consults  orthopedic surgery  Significant Tests:  See full reports for all details     Ct Hip Right Wo Contrast  Result Date: 09/15/2017 CLINICAL DATA:  Acute onset of right hip pain, radiating to the knee. EXAM: CT OF THE RIGHT HIP WITHOUT CONTRAST TECHNIQUE: Multidetector CT imaging of the right hip was performed according to the standard protocol. Multiplanar CT image reconstructions were also generated. COMPARISON:  Right hip radiographs performed 09/14/2016 FINDINGS: Bones/Joint/Cartilage There is no evidence of fracture or dislocation. No osseous erosions are seen. The right femoral head remains seated  at the acetabulum. A trace right hip joint effusion is noted, with suggestion of mild capsular thickening. Would correlate clinically to exclude infection. There is posterior narrowing of the right hip joint. The cartilage is not well assessed on CT. Ligaments Suboptimally assessed by CT. Muscles and Tendons The visualized musculature is unremarkable in appearance. The visualized tendon structures are unremarkable. Soft tissues Scattered vascular calcifications are seen. The visualized small and large bowel loops are grossly unremarkable. No soft tissue hematoma is seen. IMPRESSION: 1. Trace right hip joint effusion, with suggestion of mild capsular thickening. Would correlate clinically to exclude infection. 2. No evidence of fracture or dislocation. No osseous erosions seen. 3. Mild degenerative posterior narrowing of the right hip joint. 4. Scattered vascular calcifications seen. Electronically Signed   By: Garald Balding M.D.   On: 09/15/2017 00:50   Mr Hip Right Wo Contrast  Result Date: 09/15/2017 CLINICAL DATA:  83 year old with right hip and thigh pain. No known injury or prior relevant surgery. EXAM: MR OF THE RIGHT HIP WITHOUT CONTRAST TECHNIQUE: Multiplanar, multisequence MR imaging was performed. No intravenous contrast was administered. COMPARISON:  CT of the right hip 09/15/2017. Abdominal MRI 10/13/2014. FINDINGS: Bones: There is no evidence of acute fracture, dislocation or avascular necrosis. The visualized bony pelvis appears normal. The visualized sacroiliac joints and symphysis pubis appear normal. Articular cartilage and labrum Articular cartilage: There are asymmetric right hip degenerative changes with femoral head osteophytes. No focal chondral defect or subchondral edema demonstrated. Labrum: There is no gross labral tear or paralabral abnormality. Joint or bursal effusion Joint effusion: As seen on CT, there is a small right hip joint  effusion. Bursae: No focal periarticular fluid  collection. Muscles and tendons Muscles and tendons: There is mild asymmetric edema within the right obturator externus and superior aspect of the right gluteus minimus muscles. There is minimal edema tracking between the right iliacus muscle and the right iliac bone. The hamstring, gluteus and iliopsoas tendons are intact. Other findings Miscellaneous: There is a right paramedian abdominal wall hernia containing a portion of the transverse colon. Diverticular changes are present throughout the visualized colon. No evidence of bowel obstruction or incarceration. Mild subcutaneous edema within the anterior abdominal wall bilaterally. There is T2 hyperintensity within the endometrial canal which is distended to 1.8 cm. IMPRESSION: 1. No acute osseous findings. 2. Asymmetric right hip degenerative changes with small right hip joint effusion, nonspecific. There is no subchondral edema or periarticular inflammation to suggest infection. 3. Mild edema within the right obturator externus and gluteus minimus muscles, likely strain. 4. Abnormal distended, fluid-filled endometrial canal. This is partially visualized on previous abdominal MRI and likely due to cervical stenosis given relative stability. However, pelvic ultrasound should be considered, especially if the patient has any vaginal bleeding. 5. Right abdominal wall hernia containing transverse colon. No evidence of bowel obstruction. 6. A preliminary report of these findings was issued by Dr. Dixon Boos at [phone] hours on 09/15/2017 and discussed with Dr. Beather Arbour. I called the endometrial canal findings to Dr. Conni Slipper at [phone] hours. Electronically Signed   By: Richardean Sale M.D.   On: 09/15/2017 07:24   Dg Hip Unilat With Pelvis 2-3 Views Right  Result Date: 09/14/2017 CLINICAL DATA:  Left hip and leg pain. EXAM: DG HIP (WITH OR WITHOUT PELVIS) 2-3V RIGHT COMPARISON:  None. FINDINGS: Degenerative changes in the hips bilaterally with joint space narrowing and  spurring. SI joints are symmetric and unremarkable. No acute bony abnormality. Specifically, no fracture, subluxation, or dislocation. Vascular calcifications noted. IMPRESSION: Mild to moderate symmetric degenerative changes in the hips. No acute bony abnormality. Electronically Signed   By: Rolm Baptise M.D.   On: 09/14/2017 21:22       Today   Subjective:   Angela Leonard patient feeling much better denies any complaints  Objective:   Blood pressure 129/81, pulse 86, temperature (!) 97.5 F (36.4 C), temperature source Oral, resp. rate 18, height 5\' 3"  (1.6 m), weight 87.1 kg (192 lb), SpO2 95 %.  .  Intake/Output Summary (Last 24 hours) at 09/17/2017 1344 Last data filed at 09/17/2017 0900 Gross per 24 hour  Intake 600 ml  Output -  Net 600 ml    Exam VITAL SIGNS: Blood pressure 129/81, pulse 86, temperature (!) 97.5 F (36.4 C), temperature source Oral, resp. rate 18, height 5\' 3"  (1.6 m), weight 87.1 kg (192 lb), SpO2 95 %.  GENERAL:  83 y.o.-year-old patient lying in the bed with no acute distress.  EYES: Pupils equal, round, reactive to light and accommodation. No scleral icterus. Extraocular muscles intact.  HEENT: Head atraumatic, normocephalic. Oropharynx and nasopharynx clear.  NECK:  Supple, no jugular venous distention. No thyroid enlargement, no tenderness.  LUNGS: Normal breath sounds bilaterally, no wheezing, rales,rhonchi or crepitation. No use of accessory muscles of respiration.  CARDIOVASCULAR: S1, S2 normal. No murmurs, rubs, or gallops.  ABDOMEN: Soft, nontender, nondistended. Bowel sounds present. No organomegaly or mass.  EXTREMITIES: No pedal edema, cyanosis, or clubbing.  NEUROLOGIC: Cranial nerves II through XII are intact. Muscle strength 5/5 in all extremities. Sensation intact. Gait not checked.  PSYCHIATRIC: The patient is alert  and oriented x 3.  SKIN: No obvious rash, lesion, or ulcer.   Data Review     CBC w Diff:  Lab Results  Component  Value Date   WBC 10.8 09/16/2017   HGB 12.2 09/16/2017   HCT 36.3 09/16/2017   PLT 152 09/16/2017   LYMPHOPCT 9 09/15/2017   MONOPCT 6 09/15/2017   EOSPCT 0 09/15/2017   BASOPCT 0 09/15/2017   CMP:  Lab Results  Component Value Date   NA 136 09/16/2017   K 4.1 09/16/2017   CL 110 09/16/2017   CO2 22 09/16/2017   BUN 33 (H) 09/16/2017   CREATININE 1.05 (H) 09/16/2017   PROT 6.3 (L) 10/14/2014   ALBUMIN 3.0 (L) 10/14/2014   BILITOT 0.4 10/14/2014   ALKPHOS 56 10/14/2014   AST 16 10/14/2014   ALT 10 (L) 10/14/2014  .  Micro Results Recent Results (from the past 240 hour(s))  Urine culture     Status: Abnormal   Collection Time: 09/15/17 12:10 AM  Result Value Ref Range Status   Specimen Description   Final    URINE, RANDOM Performed at [facility], [address] [place], [place] [address]    Special Requests   Final    NONE Performed at [facility], [address] [place], [place] [address]    Culture >=100,000 COLONIES/mL CITROBACTER FREUNDII (A)  Final   Report Status 09/17/2017 FINAL  Final   Organism ID, Bacteria CITROBACTER FREUNDII (A)  Final      Susceptibility   Citrobacter freundii - MIC*    CEFAZOLIN >=64 RESISTANT Resistant     CEFTRIAXONE <=1 SENSITIVE Sensitive     CIPROFLOXACIN <=0.25 SENSITIVE Sensitive     GENTAMICIN <=1 SENSITIVE Sensitive     IMIPENEM 2 SENSITIVE Sensitive     NITROFURANTOIN <=16 SENSITIVE Sensitive     TRIMETH/SULFA <=20 SENSITIVE Sensitive     PIP/TAZO <=4 SENSITIVE Sensitive     * >=100,000 COLONIES/mL CITROBACTER FREUNDII  Culture, blood (routine x 2)     Status: None (Preliminary result)   Collection Time: 09/15/17  1:13 AM  Result Value Ref Range Status   Specimen Description BLOOD LEFT ANTECUBITAL  Final   Special Requests   Final    BOTTLES DRAWN AEROBIC AND ANAEROBIC Blood Culture results may not be optimal due to an excessive volume of blood received in culture bottles   Culture   Final     NO GROWTH 2 DAYS Performed at [facility], [address], [place], [place] [address]    Report Status PENDING  Incomplete  Culture, blood (routine x 2)     Status: None (Preliminary result)   Collection Time: 09/15/17  1:13 AM  Result Value Ref Range Status   Specimen Description BLOOD LEFT HAND  Final   Special Requests   Final    BOTTLES DRAWN AEROBIC AND ANAEROBIC Blood Culture adequate volume   Culture   Final    NO GROWTH 2 DAYS Performed at [facility], [address] [place], [place] [address]    Report Status PENDING  Incomplete        Code Status Orders  (From admission, onward)        Start     Ordered   09/15/17 0621  Full code  Continuous     09/15/17 0620    Code Status History    Date Active Date Inactive Code Status Order ID Comments User Context   10/13/2014 [id number]  10/15/2014 2057 Full Code [id number]  Molly Maduro, MD ED    Advance Directive Documentation     Most Recent Value  Type of Advance Directive  Healthcare Power of [place]  Pre-existing out of facility DNR order (yellow form or pink MOST form)  -  "MOST" Form in Place?  -          Follow-up Information    Derinda Late, MD On 09/26/2017.   Specialty:  Family Medicine Why:  hosp f/u;  @ 10:00 am Contact information: [address] [facility] [place]  [address] [phone]           Discharge Medications   Allergies as of 09/17/2017   No Known Allergies     Medication List    TAKE these medications   aspirin EC 81 MG tablet Take 81 mg by mouth at bedtime.   cholecalciferol 1000 units tablet Commonly known as:  VITAMIN D Take 1,000 Units by mouth daily.   ciprofloxacin 500 MG tablet Commonly known as:  CIPRO Take 1 tablet (500 mg total) by mouth 2 (two) times daily for 4 days.   diltiazem 120 MG 24 hr capsule Commonly known as:  TIAZAC Take 120 mg by mouth daily.   ELIQUIS 5 MG Tabs  tablet Generic drug:  apixaban Take 5 mg by mouth every 12 (twelve) hours.   folic acid 1 MG tablet Commonly known as:  FOLVITE Take 1 mg by mouth daily.   HYDROcodone-acetaminophen 5-325 MG tablet Commonly known as:  NORCO/VICODIN Take 1-2 tablets by mouth every 6 (six) hours as needed for severe pain.   methotrexate 2.5 MG tablet Commonly known as:  RHEUMATREX Take 6 tablets by mouth every Thursday.   multivitamin with minerals Tabs tablet Take 1 tablet by mouth daily.   ramipril 10 MG capsule Commonly known as:  ALTACE Take 10 mg by mouth daily.   simvastatin 20 MG tablet Commonly known as:  ZOCOR Take 20 mg by mouth at bedtime.   vitamin B-12 1000 MCG tablet Commonly known as:  CYANOCOBALAMIN Take 1,000 mcg by mouth daily.          Total Time in preparing paper work, data evaluation and todays exam - 45 minutes  Dustin Flock M.D on 09/17/2017 at [place]  [phone]

## 2017-09-17 NOTE — Progress Notes (Signed)
Discharge instructions reviewed with patient and daughter. Prescriptions and follow up appt confirmed with daughter. IV removed without complication. Patient awaiting transport to vehicle via volunteer services. Daughter to transport home.

## 2017-09-17 NOTE — Care Management Note (Signed)
Case Management Note  Patient Details  Name: Natha Guin MRN: [id number] Date of Birth: 07/09/1934  Subjective/Objective:                  Spoke with patient at bedside and she declined home health PT. She prefers out patient PT at [facility]. Faxed order down to [facility] rehab. No other DME or other needs identified.                 Action/Plan:   Expected Discharge Date:  09/17/17               Expected Discharge Plan:  OP Rehab  In-House Referral:     Discharge planning Services  CM Consult  Post Acute Care Choice:    Choice offered to:  Patient  DME Arranged:    DME Agency:     HH Arranged:    [place] Agency:     Status of Service:  Completed, signed off  If discussed at [employer] of Stay Meetings, dates discussed:    Additional Comments:  Jolly Mango, RN 09/17/2017, 10:18 AM

## 2017-09-20 LAB — CULTURE, BLOOD (ROUTINE X 2)
Culture: NO GROWTH
Culture: NO GROWTH
SPECIAL REQUESTS: ADEQUATE

## 2017-09-22 ENCOUNTER — Telehealth: Payer: Self-pay

## 2017-09-22 NOTE — Telephone Encounter (Signed)
Flagged on EMMI report for not receiving discharge papers.  1st attempt to reach patient made 09/22/17 at 2:45pm, however unavailable.  Left voicemail encouraging callback.  Will attempt at a later time.

## 2017-09-23 NOTE — Telephone Encounter (Signed)
Reached patient's daughter, Adaira Centola, upon second attempt.  Per daughter, patient answered incorrectly as they did receive her discharge papers and still have them.  She reported they did not have any questions or concerns at this time regarding her discharge and that she was taking all her medications.  Daughter mentioned the patient still hobbling some, but not as bad as she was before she was admitted.  Patient has an appointment with her PCP on Friday so they plan to mention it to the provider then.  I thanked her for her time and informed her that they would receive one more automated call in the next few days as a final check in.

## 2018-08-03 ENCOUNTER — Emergency Department: Payer: Medicare Other

## 2018-08-03 ENCOUNTER — Inpatient Hospital Stay
Admission: EM | Admit: 2018-08-03 | Discharge: 2018-08-06 | DRG: 641 | Disposition: A | Discharge status: Home Health | Payer: Medicare Other | Attending: Internal Medicine | Admitting: Internal Medicine

## 2018-08-03 ENCOUNTER — Other Ambulatory Visit: Payer: Self-pay

## 2018-08-03 ENCOUNTER — Encounter: Payer: Self-pay | Admitting: Internal Medicine

## 2018-08-03 DIAGNOSIS — J189 Pneumonia, unspecified organism: Secondary | ICD-10-CM | POA: Diagnosis not present

## 2018-08-03 DIAGNOSIS — M1611 Unilateral primary osteoarthritis, right hip: Secondary | ICD-10-CM | POA: Diagnosis present

## 2018-08-03 DIAGNOSIS — E785 Hyperlipidemia, unspecified: Secondary | ICD-10-CM | POA: Diagnosis present

## 2018-08-03 DIAGNOSIS — R000 Tachycardia, unspecified: Secondary | ICD-10-CM | POA: Diagnosis present

## 2018-08-03 DIAGNOSIS — I10 Essential (primary) hypertension: Secondary | ICD-10-CM | POA: Diagnosis present

## 2018-08-03 DIAGNOSIS — E860 Dehydration: Principal | ICD-10-CM | POA: Diagnosis present

## 2018-08-03 DIAGNOSIS — M25551 Pain in right hip: Secondary | ICD-10-CM

## 2018-08-03 DIAGNOSIS — Z8249 Family history of ischemic heart disease and other diseases of the circulatory system: Secondary | ICD-10-CM

## 2018-08-03 DIAGNOSIS — M545 Low back pain: Secondary | ICD-10-CM | POA: Diagnosis present

## 2018-08-03 DIAGNOSIS — A419 Sepsis, unspecified organism: Secondary | ICD-10-CM | POA: Diagnosis present

## 2018-08-03 DIAGNOSIS — Z87891 Personal history of nicotine dependence: Secondary | ICD-10-CM | POA: Diagnosis not present

## 2018-08-03 DIAGNOSIS — Z809 Family history of malignant neoplasm, unspecified: Secondary | ICD-10-CM

## 2018-08-03 DIAGNOSIS — Z820 Family history of epilepsy and other diseases of the nervous system: Secondary | ICD-10-CM

## 2018-08-03 DIAGNOSIS — Z23 Encounter for immunization: Secondary | ICD-10-CM | POA: Diagnosis present

## 2018-08-03 HISTORY — DX: Hyperlipidemia, unspecified: E78.5

## 2018-08-03 LAB — CBC WITH DIFFERENTIAL/PLATELET
Abs Immature Granulocytes: 0.12 10*3/uL — ABNORMAL HIGH (ref 0.00–0.07)
BASOS ABS: 0 10*3/uL (ref 0.0–0.1)
BASOS PCT: 0 %
EOS ABS: 0 10*3/uL (ref 0.0–0.5)
EOS PCT: 0 %
HEMATOCRIT: 46.7 % — AB (ref 36.0–46.0)
Hemoglobin: 15.8 g/dL — ABNORMAL HIGH (ref 12.0–15.0)
IMMATURE GRANULOCYTES: 1 %
Lymphocytes Relative: 5 %
Lymphs Abs: 1.1 10*3/uL (ref 0.7–4.0)
MCH: 33.8 pg (ref 26.0–34.0)
MCHC: 33.8 g/dL (ref 30.0–36.0)
MCV: 99.8 fL (ref 80.0–100.0)
Monocytes Absolute: 1.6 10*3/uL — ABNORMAL HIGH (ref 0.1–1.0)
Monocytes Relative: 8 %
NEUTROS PCT: 86 %
NRBC: 0 % (ref 0.0–0.2)
Neutro Abs: 18.7 10*3/uL — ABNORMAL HIGH (ref 1.7–7.7)
PLATELETS: 272 10*3/uL (ref 150–400)
RBC: 4.68 MIL/uL (ref 3.87–5.11)
RDW: 14.5 % (ref 11.5–15.5)
WBC: 21.6 10*3/uL — AB (ref 4.0–10.5)

## 2018-08-03 LAB — TROPONIN I

## 2018-08-03 LAB — COMPREHENSIVE METABOLIC PANEL
ALBUMIN: 3.5 g/dL (ref 3.5–5.0)
ALT: 24 U/L (ref 0–44)
AST: 31 U/L (ref 15–41)
Alkaline Phosphatase: 53 U/L (ref 38–126)
Anion gap: 14 (ref 5–15)
BUN: 29 mg/dL — AB (ref 8–23)
CHLORIDE: 102 mmol/L (ref 98–111)
CO2: 19 mmol/L — ABNORMAL LOW (ref 22–32)
Calcium: 9.2 mg/dL (ref 8.9–10.3)
Creatinine, Ser: 1.04 mg/dL — ABNORMAL HIGH (ref 0.44–1.00)
GFR calc Af Amer: 58 mL/min — ABNORMAL LOW (ref >60)
GFR, EST NON AFRICAN AMERICAN: 50 mL/min — AB (ref >60)
Glucose, Bld: 141 mg/dL — ABNORMAL HIGH (ref 70–99)
Potassium: 4.1 mmol/L (ref 3.5–5.1)
Sodium: 135 mmol/L (ref 135–145)
Total Bilirubin: 0.8 mg/dL (ref 0.3–1.2)
Total Protein: 7.8 g/dL (ref 6.5–8.1)

## 2018-08-03 LAB — URINALYSIS, COMPLETE (UACMP) WITH MICROSCOPIC
Bilirubin Urine: NEGATIVE
GLUCOSE, UA: NEGATIVE mg/dL
KETONES UR: NEGATIVE mg/dL
Leukocytes,Ua: NEGATIVE
NITRITE: NEGATIVE
PROTEIN: 30 mg/dL — AB
Specific Gravity, Urine: 1.027 (ref 1.005–1.030)
pH: 5 (ref 5.0–8.0)

## 2018-08-03 LAB — TSH: TSH: 1.322 u[IU]/mL (ref 0.350–4.500)

## 2018-08-03 LAB — LACTIC ACID, PLASMA: Lactic Acid, Venous: 1.5 mmol/L (ref 0.5–1.9)

## 2018-08-03 MED ORDER — GADOBUTROL 1 MMOL/ML IV SOLN
7.0000 mL | Freq: Once | INTRAVENOUS | Status: AC | PRN
  Administered 2018-08-03: 7 mL via INTRAVENOUS

## 2018-08-03 MED ORDER — SODIUM CHLORIDE 0.9 % IV BOLUS
500.0000 mL | Freq: Once | INTRAVENOUS | Status: AC
  Administered 2018-08-03: 500 mL via INTRAVENOUS

## 2018-08-03 MED ORDER — MORPHINE SULFATE (PF) 4 MG/ML IV SOLN
4.0000 mg | Freq: Once | INTRAVENOUS | Status: AC
  Administered 2018-08-03: 4 mg via INTRAVENOUS
  Filled 2018-08-03: qty 1

## 2018-08-03 MED ORDER — SODIUM CHLORIDE 0.9 % IV SOLN
500.0000 mg | Freq: Once | INTRAVENOUS | Status: AC
  Administered 2018-08-03: 500 mg via INTRAVENOUS
  Filled 2018-08-03: qty 500

## 2018-08-03 MED ORDER — DILTIAZEM HCL 25 MG/5ML IV SOLN
10.0000 mg | Freq: Once | INTRAVENOUS | Status: AC
Start: 2018-08-03 — End: 2018-08-03
  Administered 2018-08-03: 10 mg via INTRAVENOUS
  Filled 2018-08-03 (×2): qty 5

## 2018-08-03 MED ORDER — MORPHINE SULFATE (PF) 2 MG/ML IV SOLN
2.0000 mg | Freq: Once | INTRAVENOUS | Status: AC
  Administered 2018-08-03: 2 mg via INTRAVENOUS
  Filled 2018-08-03: qty 1

## 2018-08-03 NOTE — ED Notes (Addendum)
Cannot urinate at this time. Aware of need for urine sample.   Pt did not take her PO cardizem today.

## 2018-08-03 NOTE — H&P (Signed)
[facility] [place] NAME: Angela Leonard    MR#:  [id number]  DATE OF BIRTH:  1934-10-01  DATE OF ADMISSION:  08/03/2018  PRIMARY CARE PHYSICIAN: Derinda Late, MD   REQUESTING/REFERRING PHYSICIAN: Cherylann Banas, MD  CHIEF COMPLAINT:   Chief Complaint  Patient presents with  . Hip Pain  . Back Pain    HISTORY OF PRESENT ILLNESS:  Angela Leonard  is a 84 y.o. female who presents with chief complaint as above.  Patient presented to the ED for complaint of right hip pain and back pain with difficulty ambulating.  Here in the ED work-up was suspicious for the possibility of infection given leukocytosis and tachycardia.  She had a similar presentation sometime ago with septic arthritis.  Imaging and work-up here shows that she does not have septic joint.  However, chest x-ray imaging is concerning for the possibility of atypical pneumonia, with interstitial markings.  Hospitalist were called for admission  PAST MEDICAL HISTORY:   Past Medical History:  Diagnosis Date  . HLD (hyperlipidemia)   . Hypertension      PAST SURGICAL HISTORY:   Past Surgical History:  Procedure Laterality Date  . BACK SURGERY    . CHOLECYSTECTOMY N/A 10/14/2014   Procedure: LAPAROSCOPIC CHOLECYSTECTOMY;  Surgeon: Marlyce Huge, MD;  Location: [facility] ORS;  Service: General;  Laterality: N/A;  . COLON SURGERY       SOCIAL HISTORY:   Social History   Tobacco Use  . Smoking status: Former Research scientist (life sciences)  . Smokeless tobacco: Never Used  Substance Use Topics  . Alcohol use: No     FAMILY HISTORY:   Family History  Problem Relation Age of Onset  . Alzheimer's disease Mother   . Parkinson's disease Mother   . Cancer Father   . Heart disease Sister   . Parkinson's disease Brother   . Heart disease Brother      DRUG ALLERGIES:  No Known Allergies  MEDICATIONS AT HOME:   Prior to Admission medications   Medication Sig Start Date End Date  Taking? Authorizing Provider  diltiazem (TIAZAC) 120 MG 24 hr capsule Take 120 mg by mouth daily.   Yes [provider]  ELIQUIS 5 MG TABS tablet Take 5 mg by mouth every 12 (twelve) hours.   Yes [provider]  methotrexate (RHEUMATREX) 2.5 MG tablet Take 6 tablets by mouth every Tuesday.    Yes [provider]  aspirin EC 81 MG tablet Take 81 mg by mouth at bedtime.    [provider]  CARTIA XT 120 MG 24 hr capsule Take 120 mg by mouth daily. 05/04/18   [provider]  cholecalciferol (VITAMIN D) 1000 units tablet Take 1,000 Units by mouth daily.    [provider]  folic acid (FOLVITE) 1 MG tablet Take 1 mg by mouth daily.    [provider]  Multiple Vitamin (MULTIVITAMIN WITH MINERALS) TABS tablet Take 1 tablet by mouth daily.    [provider]  ramipril (ALTACE) 10 MG capsule Take 10 mg by mouth daily.    [provider]  simvastatin (ZOCOR) 20 MG tablet Take 20 mg by mouth at bedtime.    [provider]  vitamin B-12 (CYANOCOBALAMIN) 1000 MCG tablet Take 1,000 mcg by mouth daily.    [provider]    REVIEW OF SYSTEMS:  Review of Systems  Constitutional: Negative for chills, fever, malaise/fatigue and weight loss.  HENT: Negative for ear  pain, hearing loss and tinnitus.   Eyes: Negative for blurred vision, double vision, pain and redness.  Respiratory: Negative for cough, hemoptysis and shortness of breath.   Cardiovascular: Negative for chest pain, palpitations, orthopnea and leg swelling.  Gastrointestinal: Negative for abdominal pain, constipation, diarrhea, nausea and vomiting.  Genitourinary: Negative for dysuria, frequency and hematuria.  Musculoskeletal: Positive for joint pain (Right hip). Negative for back pain and neck pain.  Skin:       No acne, rash, or lesions  Neurological: Negative for dizziness, tremors, focal weakness and weakness.  Endo/Heme/Allergies: Negative  for polydipsia. Does not bruise/bleed easily.  Psychiatric/Behavioral: Negative for depression. The patient is not nervous/anxious and does not have insomnia.      VITAL SIGNS:   Vitals:   08/03/18 1700 08/03/18 1800 08/03/18 1830 08/03/18 2101  BP: (!) 122/92 103/67 119/82 101/62  Pulse: (!) 107 (!) 113  (!) 107  Resp: 17 (!) 23 (!) 26 19  Temp:      TempSrc:      SpO2: 93% 96% 96% 96%  Weight:      Height:       Wt Readings from Last 3 Encounters:  08/03/18 77.1 kg  09/17/17 87.1 kg  10/13/14 78.5 kg    PHYSICAL EXAMINATION:  Physical Exam  Vitals reviewed. Constitutional: She is oriented to person, place, and time. She appears well-developed and well-nourished. No distress.  HENT:  Head: Normocephalic and atraumatic.  Mouth/Throat: Oropharynx is clear and moist.  Eyes: Pupils are equal, round, and reactive to light. Conjunctivae and EOM are normal. No scleral icterus.  Neck: Normal range of motion. Neck supple. No JVD present. No thyromegaly present.  Cardiovascular: Regular rhythm and intact distal pulses. Exam reveals no gallop and no friction rub.  No murmur heard. Tachycardic  Respiratory: Effort normal and breath sounds normal. No respiratory distress. She has no wheezes. She has no rales.  GI: Soft. Bowel sounds are normal. She exhibits no distension. There is no abdominal tenderness.  Musculoskeletal: Normal range of motion.        General: Tenderness (Right hip) present. No edema.     Comments: No arthritis, no gout  Lymphadenopathy:    She has no cervical adenopathy.  Neurological: She is alert and oriented to person, place, and time. No cranial nerve deficit.  No dysarthria, no aphasia  Skin: Skin is warm and dry. No rash noted. No erythema.  Psychiatric: She has a normal mood and affect. Her behavior is normal. Judgment and thought content normal.    LABORATORY PANEL:   CBC Recent Labs  Lab 08/03/18 1426  WBC 21.6*  HGB 15.8*  HCT 46.7*  PLT 272    ------------------------------------------------------------------------------------------------------------------  Chemistries  Recent Labs  Lab 08/03/18 1426  NA 135  K 4.1  CL 102  CO2 19*  GLUCOSE 141*  BUN 29*  CREATININE 1.04*  CALCIUM 9.2  AST 31  ALT 24  ALKPHOS 53  BILITOT 0.8   ------------------------------------------------------------------------------------------------------------------  Cardiac Enzymes Recent Labs  Lab 08/03/18 1426  TROPONINI <0.03   ------------------------------------------------------------------------------------------------------------------  RADIOLOGY:  Dg Lumbar Spine 2-3 Views  Result Date: 08/03/2018 CLINICAL DATA:  Low back and right hip pain EXAM: LUMBAR SPINE - 2-3 VIEW COMPARISON:  MRI 09/15/2017 FINDINGS: Grade 1 L4-5 anterolisthesis. Multilevel mild facet hypertrophy. Vertebral body heights are maintained. Mild L5-S1 disc space narrowing. Calcification of the abdominal aorta. IMPRESSION: Grade 1 degenerative anterolisthesis at L4-5.  No acute abnormality. Electronically Signed   By: Lennette Bihari  [place] M.D.   On: 08/03/2018 15:44   Mr Lumbar Spine W Wo Contrast  Result Date: 08/03/2018 CLINICAL DATA:  Prior lumbar surgery. Low back and right hip pain over the last 2 days. EXAM: MRI LUMBAR SPINE WITHOUT AND WITH CONTRAST TECHNIQUE: Multiplanar and multiecho pulse sequences of the lumbar spine were obtained without and with intravenous contrast. CONTRAST:  7 cc Gadavist COMPARISON:  None. FINDINGS: Segmentation:  5 lumbar type vertebral bodies. Alignment:  6 mm anterolisthesis L4-5.  2 mm anterolisthesis L5-S1. Vertebrae:  No fracture or primary bone lesion. Conus medullaris and cauda equina: Conus extends to the L1 level. Conus and cauda equina appear normal. Paraspinal and other soft tissues: Negative Disc levels: T11-12 and T12-L1: Normal. L1-2: Central disc herniation indents the thecal sac but does not cause visible neural  compression. L2-3: Mild bulging of the disc. Mild ligamentous prominence. No compressive stenosis. L3-4: Bulging of the disc more prominent towards the left. Mild facet and ligamentous prominence. Mild narrowing of the left lateral recess but without visible neural compression. Bilateral foraminal narrowing with some potential to cause irritation of either L3 nerve. L4-5: Previous left hemilaminectomy. Bilateral facet arthropathy allowing 6 mm of anterolisthesis. Shallow protrusion of the disc. Left foraminal stenosis due to encroachment by osteophyte in disc material could compress the left L4 nerve. L5-S1: Facet osteoarthritis with 2 mm of anterolisthesis. Shallow protrusion of the disc. No compressive canal or foraminal stenosis. IMPRESSION: L1-2: Central disc herniation indents the thecal sac but does not affect the neural structures. L3-4: Bulging of the disc more prominent towards the left. Bilateral facet osteoarthritis. Left lateral recess narrowing that could possibly affect the left L4 nerve. Bilateral foraminal encroachment by osteophytes and disc material would have some potential to irritate either L3 nerve, though definite compression is not established. L4-5: Previous left hemilaminectomy. 6 mm anterolisthesis. Shallow protrusion of the disc. Left foraminal stenosis that could affect the exiting left L4 nerve. L5-S1: 2 mm degenerative anterolisthesis. Bulging of the disc. No apparent compressive stenosis. Electronically Signed   By: Nelson Chimes M.D.   On: 08/03/2018 20:35   Mr Hip Right W Wo Contrast  Result Date: 08/03/2018 CLINICAL DATA:  Right hip pain over the last 2 days. EXAM: MRI OF THE RIGHT HIP WITHOUT AND WITH CONTRAST TECHNIQUE: Multiplanar, multisequence MR imaging was performed both before and after administration of intravenous contrast. CONTRAST:  7 cc Gadavist COMPARISON:  Radiography same day.  MRI 09/15/2017. FINDINGS: Bones: Very similar appearance to the previous study. No  fracture or primary bone lesion. No avascular necrosis. Articular cartilage and labrum Articular cartilage: Redemonstration of asymmetric osteoarthritis of the right hip more than the left. Femoral head osteophytes. No focal cartilage defect or subchondral edema. Labrum:  Limited detail.  No gross labral pathology identified. Joint or bursal effusion Joint effusion: Small to moderate size effusion on the right, probably minimally larger than on the previous study. Normal joint fluid on the left. Bursae: Negative Muscles and tendons Muscles and tendons:  Negative Other findings Miscellaneous: Right-sided abdominal hernia as demonstrated previously, containing intestine and fat. Redemonstration of fluid within the endometrial canal. IMPRESSION: As seen last year, there is asymmetric osteoarthritis of the right hip with joint space narrowing, osteophytes and a moderate right hip effusion. This could be symptomatic. The effusion may be slightly larger than was noted last year. No evidence of fracture or new pathologic process. Redemonstration of a right abdominal wall hernia and fluid within the endometrial canal, both similar to the  study of last year. Electronically Signed   By: Nelson Chimes M.D.   On: 08/03/2018 20:41   Dg Chest Portable 1 View  Result Date: 08/03/2018 CLINICAL DATA:  Tachycardia EXAM: PORTABLE CHEST 1 VIEW COMPARISON:  None. FINDINGS: Diffuse interstitial coarsening. Mild cardiomegaly. No focal airspace consolidation. No pleural effusion or pneumothorax. IMPRESSION: Bronchitic type diffuse interstitial opacities. Electronically Signed   By: Ulyses Jarred M.D.   On: 08/03/2018 17:03   Dg Hip Unilat W Or Wo Pelvis 2-3 Views Right  Result Date: 08/03/2018 CLINICAL DATA:  84 year old female with back and right hip pain beginning 2 days ago. No known injury. EXAM: DG HIP (WITH OR WITHOUT PELVIS) 2-3V RIGHT COMPARISON:  09/14/2017. FINDINGS: Femoral heads remain normally located. Asymmetric right  hip joint space loss, subchondral sclerosis and spurring redemonstrated. The pelvis appears stable and intact. Aortoiliac calcified atherosclerosis. Stable bowel gas pattern. Proximal right femur appears stable and intact. Bilateral lower extremity calcified peripheral vascular disease. IMPRESSION: 1. No acute osseous abnormality identified about the right hip or pelvis. 2. Moderate asymmetric right hip osteoarthritis. Electronically Signed   By: Genevie Ann M.D.   On: 08/03/2018 15:42    EKG:   Orders placed or performed during the hospital encounter of 08/03/18  . ED EKG  . ED EKG    IMPRESSION AND PLAN:  Principal Problem:   Sepsis ([place]) -antibiotics given, sepsis would be due to atypical pneumonia, lactic acid within normal limits, blood pressure stable, culture sent Active Problems:   Atypical pneumonia -antibiotics and supportive treatment   Arthritis of right hip -PRN analgesia, continue home dose methotrexate, PT OT consult   HTN (hypertension) -continue home meds   HLD (hyperlipidemia) -home dose antilipid  Chart review performed and case discussed with ED provider. Labs, imaging and/or ECG reviewed by provider and discussed with patient/family. Management plans discussed with the patient and/or family.  DVT PROPHYLAXIS: Systemic anticoagulation  GI PROPHYLAXIS:  None  ADMISSION STATUS: Inpatient     CODE STATUS: Full Code Status History    Date Active Date Inactive Code Status Order ID Comments User Context   09/15/2017 0620 09/17/2017 1431 Full Code [id number]  Harrie Foreman, MD Inpatient   10/13/2014 0556 10/15/2014 2057 Full Code [id number]  Molly Maduro, MD ED      TOTAL TIME TAKING CARE OF THIS PATIENT: 45 minutes.   Ethlyn Daniels 08/03/2018, 11:36 PM  [facility]  Office  [phone]  CC: Primary care physician; Derinda Late, MD  Note:  This document was prepared using Dragon voice recognition software and may include unintentional  dictation errors.

## 2018-08-03 NOTE — ED Notes (Signed)
Pt in MRI.

## 2018-08-03 NOTE — ED Notes (Signed)
Pt on phone with MRI

## 2018-08-03 NOTE — ED Notes (Addendum)
.. ED TO INPATIENT HANDOFF REPORT  ED Nurse Name and Phone #: Deneise Lever [place] Name/Age/Gender Angela Leonard 84 y.o. female Room/Bed: ED26A/ED26A  Code Status   Code Status: Prior  Home/SNF/Other Home Patient oriented to: self, place, time and situation Is this baseline? Yes   Triage Complete: Triage complete  Chief Complaint back pain  Triage Note She arrives today via [facility] from home with reports of back and hip pain that began two days ago  Pt denies any injury  Tachycardic upon arrival   10/10 pain reported upon triage    Allergies No Known Allergies  Level of Care/Admitting Diagnosis ED Disposition    ED Disposition Condition [place]: [place] [[id number]]  Level of Care: Med-Surg [16]  Diagnosis: Sepsis [facility]) [[id number]]  Admitting Physician: Lance Coon [[id number]]  Attending Physician: Lance Coon [phone]  Estimated length of stay: past midnight tomorrow  Certification:: I certify this patient will need inpatient services for at least 2 midnights  PT Class (Do Not Modify): Inpatient [101]  PT Acc Code (Do Not Modify): Private [1]       B Medical/Surgery History Past Medical History:  Diagnosis Date  . HLD (hyperlipidemia)   . Hypertension    Past Surgical History:  Procedure Laterality Date  . BACK SURGERY    . CHOLECYSTECTOMY N/A 10/14/2014   Procedure: LAPAROSCOPIC CHOLECYSTECTOMY;  Surgeon: Marlyce Huge, MD;  Location: [facility] ORS;  Service: General;  Laterality: N/A;  . COLON SURGERY       A IV Location/Drains/Wounds Patient Lines/Drains/Airways Status   Active Line/Drains/Airways    Name:   Placement date:   Placement time:   Site:   Days:   Peripheral IV 08/03/18 Left Antecubital   08/03/18    1542    Antecubital   less than 1          Intake/Output Last 24 hours No intake or output data in the 24 hours ending 08/03/18 2352  Labs/Imaging Results for orders placed or performed  during the hospital encounter of 08/03/18 (from the past 48 hour(s))  CBC with Differential     Status: Abnormal   Collection Time: 08/03/18  2:26 PM  Result Value Ref Range   WBC 21.6 (H) 4.0 - 10.5 K/uL   RBC 4.68 3.87 - 5.11 MIL/uL   Hemoglobin 15.8 (H) 12.0 - 15.0 g/dL   HCT 46.7 (H) 36.0 - 46.0 %   MCV 99.8 80.0 - 100.0 fL   MCH 33.8 26.0 - 34.0 pg   MCHC 33.8 30.0 - 36.0 g/dL   RDW 14.5 11.5 - 15.5 %   Platelets 272 150 - 400 K/uL   nRBC 0.0 0.0 - 0.2 %   Neutrophils Relative % 86 %   Neutro Abs 18.7 (H) 1.7 - 7.7 K/uL   Lymphocytes Relative 5 %   Lymphs Abs 1.1 0.7 - 4.0 K/uL   Monocytes Relative 8 %   Monocytes Absolute 1.6 (H) 0.1 - 1.0 K/uL   Eosinophils Relative 0 %   Eosinophils Absolute 0.0 0.0 - 0.5 K/uL   Basophils Relative 0 %   Basophils Absolute 0.0 0.0 - 0.1 K/uL   Immature Granulocytes 1 %   Abs Immature Granulocytes 0.12 (H) 0.00 - 0.07 K/uL    Comment: Performed at [facility], [address], [place], [place] [address]  Comprehensive metabolic panel     Status: Abnormal   Collection Time: 08/03/18  2:26 PM  Result Value  Ref Range   Sodium 135 135 - 145 mmol/L   Potassium 4.1 3.5 - 5.1 mmol/L   Chloride 102 98 - 111 mmol/L   CO2 19 (L) 22 - 32 mmol/L   Glucose, Bld 141 (H) 70 - 99 mg/dL   BUN 29 (H) 8 - 23 mg/dL   Creatinine, Ser 1.04 (H) 0.44 - 1.00 mg/dL   Calcium 9.2 8.9 - 10.3 mg/dL   Total Protein 7.8 6.5 - 8.1 g/dL   Albumin 3.5 3.5 - 5.0 g/dL   AST 31 15 - 41 U/L   ALT 24 0 - 44 U/L   Alkaline Phosphatase 53 38 - 126 U/L   Total Bilirubin 0.8 0.3 - 1.2 mg/dL   GFR calc non Af Amer 50 (L) >60 mL/min   GFR calc Af Amer 58 (L) >60 mL/min   Anion gap 14 5 - 15    Comment: Performed at [facility], [address] [place], [place] [address]  Troponin I - ONCE - STAT     Status: None   Collection Time: 08/03/18  2:26 PM  Result Value Ref Range   Troponin I <0.03 <0.03 ng/mL    Comment: Performed at [facility],  [address] [place], [place] [address]  TSH     Status: None   Collection Time: 08/03/18  2:26 PM  Result Value Ref Range   TSH 1.322 0.350 - 4.500 uIU/mL    Comment: Performed by a 3rd Generation assay with a functional sensitivity of <=0.01 uIU/mL. Performed at [facility], [address] [place], [place] [address]   Urinalysis, Complete w Microscopic     Status: Abnormal   Collection Time: 08/03/18  3:42 PM  Result Value Ref Range   Color, Urine YELLOW (A) YELLOW   APPearance CLEAR (A) CLEAR   Specific Gravity, Urine 1.027 1.005 - 1.030   pH 5.0 5.0 - 8.0   Glucose, UA NEGATIVE NEGATIVE mg/dL   Hgb urine dipstick MODERATE (A) NEGATIVE   Bilirubin Urine NEGATIVE NEGATIVE   Ketones, ur NEGATIVE NEGATIVE mg/dL   Protein, ur 30 (A) NEGATIVE mg/dL   Nitrite NEGATIVE NEGATIVE   Leukocytes,Ua NEGATIVE NEGATIVE   RBC / HPF 21-50 0 - 5 RBC/hpf   WBC, UA 0-5 0 - 5 WBC/hpf   Bacteria, UA RARE (A) NONE SEEN   Squamous Epithelial / LPF 0-5 0 - 5   Mucus PRESENT     Comment: Performed at [facility], [address] [place], [place] [address]  Lactic acid, plasma     Status: None   Collection Time: 08/03/18  3:42 PM  Result Value Ref Range   Lactic Acid, Venous 1.5 0.5 - 1.9 mmol/L    Comment: Performed at [facility], [address], [place], [place] [address]   Dg Lumbar Spine 2-3 Views  Result Date: 08/03/2018 CLINICAL DATA:  Low back and right hip pain EXAM: LUMBAR SPINE - 2-3 VIEW COMPARISON:  MRI 09/15/2017 FINDINGS: Grade 1 L4-5 anterolisthesis. Multilevel mild facet hypertrophy. Vertebral body heights are maintained. Mild L5-S1 disc space narrowing. Calcification of the abdominal aorta. IMPRESSION: Grade 1 degenerative anterolisthesis at L4-5.  No acute abnormality. Electronically Signed   By: Ulyses Jarred M.D.   On: 08/03/2018 15:44   Mr Lumbar Spine W Wo Contrast  Result Date: 08/03/2018 CLINICAL DATA:  Prior lumbar surgery. Low back and  right hip pain over the last 2 days. EXAM: MRI LUMBAR SPINE WITHOUT AND WITH CONTRAST  TECHNIQUE: Multiplanar and multiecho pulse sequences of the lumbar spine were obtained without and with intravenous contrast. CONTRAST:  7 cc Gadavist COMPARISON:  None. FINDINGS: Segmentation:  5 lumbar type vertebral bodies. Alignment:  6 mm anterolisthesis L4-5.  2 mm anterolisthesis L5-S1. Vertebrae:  No fracture or primary bone lesion. Conus medullaris and cauda equina: Conus extends to the L1 level. Conus and cauda equina appear normal. Paraspinal and other soft tissues: Negative Disc levels: T11-12 and T12-L1: Normal. L1-2: Central disc herniation indents the thecal sac but does not cause visible neural compression. L2-3: Mild bulging of the disc. Mild ligamentous prominence. No compressive stenosis. L3-4: Bulging of the disc more prominent towards the left. Mild facet and ligamentous prominence. Mild narrowing of the left lateral recess but without visible neural compression. Bilateral foraminal narrowing with some potential to cause irritation of either L3 nerve. L4-5: Previous left hemilaminectomy. Bilateral facet arthropathy allowing 6 mm of anterolisthesis. Shallow protrusion of the disc. Left foraminal stenosis due to encroachment by osteophyte in disc material could compress the left L4 nerve. L5-S1: Facet osteoarthritis with 2 mm of anterolisthesis. Shallow protrusion of the disc. No compressive canal or foraminal stenosis. IMPRESSION: L1-2: Central disc herniation indents the thecal sac but does not affect the neural structures. L3-4: Bulging of the disc more prominent towards the left. Bilateral facet osteoarthritis. Left lateral recess narrowing that could possibly affect the left L4 nerve. Bilateral foraminal encroachment by osteophytes and disc material would have some potential to irritate either L3 nerve, though definite compression is not established. L4-5: Previous left hemilaminectomy. 6 mm anterolisthesis.  Shallow protrusion of the disc. Left foraminal stenosis that could affect the exiting left L4 nerve. L5-S1: 2 mm degenerative anterolisthesis. Bulging of the disc. No apparent compressive stenosis. Electronically Signed   By: Nelson Chimes M.D.   On: 08/03/2018 20:35   Mr Hip Right W Wo Contrast  Result Date: 08/03/2018 CLINICAL DATA:  Right hip pain over the last 2 days. EXAM: MRI OF THE RIGHT HIP WITHOUT AND WITH CONTRAST TECHNIQUE: Multiplanar, multisequence MR imaging was performed both before and after administration of intravenous contrast. CONTRAST:  7 cc Gadavist COMPARISON:  Radiography same day.  MRI 09/15/2017. FINDINGS: Bones: Very similar appearance to the previous study. No fracture or primary bone lesion. No avascular necrosis. Articular cartilage and labrum Articular cartilage: Redemonstration of asymmetric osteoarthritis of the right hip more than the left. Femoral head osteophytes. No focal cartilage defect or subchondral edema. Labrum:  Limited detail.  No gross labral pathology identified. Joint or bursal effusion Joint effusion: Small to moderate size effusion on the right, probably minimally larger than on the previous study. Normal joint fluid on the left. Bursae: Negative Muscles and tendons Muscles and tendons:  Negative Other findings Miscellaneous: Right-sided abdominal hernia as demonstrated previously, containing intestine and fat. Redemonstration of fluid within the endometrial canal. IMPRESSION: As seen last year, there is asymmetric osteoarthritis of the right hip with joint space narrowing, osteophytes and a moderate right hip effusion. This could be symptomatic. The effusion may be slightly larger than was noted last year. No evidence of fracture or new pathologic process. Redemonstration of a right abdominal wall hernia and fluid within the endometrial canal, both similar to the study of last year. Electronically Signed   By: Nelson Chimes M.D.   On: 08/03/2018 20:41   Dg Chest  Portable 1 View  Result Date: 08/03/2018 CLINICAL DATA:  Tachycardia EXAM: PORTABLE CHEST 1 VIEW COMPARISON:  None. FINDINGS: Diffuse interstitial  coarsening. Mild cardiomegaly. No focal airspace consolidation. No pleural effusion or pneumothorax. IMPRESSION: Bronchitic type diffuse interstitial opacities. Electronically Signed   By: Ulyses Jarred M.D.   On: 08/03/2018 17:03   Dg Hip Unilat W Or Wo Pelvis 2-3 Views Right  Result Date: 08/03/2018 CLINICAL DATA:  84 year old female with back and right hip pain beginning 2 days ago. No known injury. EXAM: DG HIP (WITH OR WITHOUT PELVIS) 2-3V RIGHT COMPARISON:  09/14/2017. FINDINGS: Femoral heads remain normally located. Asymmetric right hip joint space loss, subchondral sclerosis and spurring redemonstrated. The pelvis appears stable and intact. Aortoiliac calcified atherosclerosis. Stable bowel gas pattern. Proximal right femur appears stable and intact. Bilateral lower extremity calcified peripheral vascular disease. IMPRESSION: 1. No acute osseous abnormality identified about the right hip or pelvis. 2. Moderate asymmetric right hip osteoarthritis. Electronically Signed   By: Genevie Ann M.D.   On: 08/03/2018 15:42    Pending Labs Unresulted Labs (From admission, onward)    Start     Ordered   08/03/18 1555  Blood culture (routine x 2)  BLOOD CULTURE X 2,   STAT     08/03/18 1554   Signed and Held  CBC  (enoxaparin (LOVENOX)    CrCl >/= 30 ml/min)  Once,   R    Comments:  Baseline for enoxaparin therapy IF NOT ALREADY DRAWN.  Notify MD if PLT < 100 K.    Signed and Held   Signed and Held  Creatinine, serum  (enoxaparin (LOVENOX)    CrCl >/= 30 ml/min)  Once,   R    Comments:  Baseline for enoxaparin therapy IF NOT ALREADY DRAWN.    Signed and Held   Signed and Held  Creatinine, serum  (enoxaparin (LOVENOX)    CrCl >/= 30 ml/min)  Weekly,   R    Comments:  while on enoxaparin therapy    Signed and Held   Signed and Held  Basic metabolic panel   Tomorrow morning,   R     Signed and Held   Signed and Held  CBC  Tomorrow morning,   R     Signed and Held          Vitals/Pain Today's Vitals   08/03/18 1700 08/03/18 1800 08/03/18 1830 08/03/18 2101  BP: (!) 122/92 103/67 119/82 101/62  Pulse: (!) 107 (!) 113  (!) 107  Resp: 17 (!) 23 (!) 26 19  Temp:      TempSrc:      SpO2: 93% 96% 96% 96%  Weight:      Height:      PainSc:   2      Isolation Precautions No active isolations  Medications Medications  morphine 4 MG/ML injection 4 mg (4 mg Intravenous Given 08/03/18 1444)  diltiazem (CARDIZEM) injection 10 mg (10 mg Intravenous Given 08/03/18 1535)  sodium chloride 0.9 % bolus 500 mL (0 mLs Intravenous Stopped 08/03/18 1647)  morphine 2 MG/ML injection 2 mg (2 mg Intravenous Given 08/03/18 1647)  gadobutrol (GADAVIST) 1 MMOL/ML injection 7 mL (7 mLs Intravenous Contrast Given 08/03/18 2010)  azithromycin (ZITHROMAX) 500 mg in sodium chloride 0.9 % 250 mL IVPB (500 mg Intravenous New Bag/Given 08/03/18 2249)  sodium chloride 0.9 % bolus 500 mL (500 mLs Intravenous New Bag/Given 08/03/18 2250)    Mobility non-ambulatory Low fall risk   Focused Assessments Cardiac Assessment Handoff:  Cardiac Rhythm: Atrial fibrillation Lab Results  Component Value Date   TROPONINI <0.03 08/03/2018   No  results found for: DDIMER Does the Patient currently have chest pain? No     R Recommendations: See Admitting Provider Note  Report given to: Vivian,RN  Additional Notes:

## 2018-08-03 NOTE — ED Notes (Signed)
Patient transported to MRI 

## 2018-08-03 NOTE — ED Notes (Signed)
Pt continues to await MRI.

## 2018-08-03 NOTE — ED Provider Notes (Signed)
[facility] Emergency Department Provider Note       Time seen: ----------------------------------------- 2:06 PM on 08/03/2018 -----------------------------------------   I have reviewed the triage vital signs and the nursing notes.  HISTORY   Chief Complaint No chief complaint on file.    HPI Angela Leonard is a 84 y.o. female with a history of hypertension, acute kidney injury, gallstones who presents to the ED for right-sided hip and low back pain.  Patient denies any recent fall or trauma, states it has hurt this severely once in the past.  She denies fevers, chills or other complaints.  Past Medical History:  Diagnosis Date  . Hypertension     Patient Active Problem List   Diagnosis Date Noted  . AKI (acute kidney injury) ([place]) 09/15/2017  . Symptomatic cholelithiasis 10/13/2014    Past Surgical History:  Procedure Laterality Date  . BACK SURGERY    . CHOLECYSTECTOMY N/A 10/14/2014   Procedure: LAPAROSCOPIC CHOLECYSTECTOMY;  Surgeon: Marlyce Huge, MD;  Location: [facility] ORS;  Service: General;  Laterality: N/A;  . COLON SURGERY      Allergies Patient has no known allergies.  Social History Social History   Tobacco Use  . Smoking status: Former Research scientist (life sciences)  . Smokeless tobacco: Never Used  Substance Use Topics  . Alcohol use: No  . Drug use: No   Review of Systems Constitutional: Negative for fever. Cardiovascular: Negative for chest pain. Respiratory: Negative for shortness of breath. Gastrointestinal: Negative for abdominal pain, vomiting and diarrhea. Genitourinary: Negative for dysuria. Musculoskeletal: Positive for back and right hip pain Skin: Negative for rash. Neurological: Negative for headaches, focal weakness or numbness.  All systems negative/normal/unremarkable except as stated in the HPI  ____________________________________________   PHYSICAL EXAM:  VITAL SIGNS: ED Triage Vitals  Enc Vitals Group      BP      Pulse      Resp      Temp      Temp src      SpO2      Weight      Height      Head Circumference      Peak Flow      Pain Score      Pain Loc      Pain Edu?      Excl. in [place]?    Constitutional: Alert and oriented.  Mild distress Eyes: Conjunctivae are normal. Normal extraocular movements. ENT      Head: Normocephalic and atraumatic.      Nose: No congestion/rhinnorhea.      Mouth/Throat: Mucous membranes are moist.      Neck: No stridor. Cardiovascular: Irregularly irregular rhythm. No murmurs, rubs, or gallops.  Normal pulses in the right lower extremity Respiratory: Normal respiratory effort without tachypnea nor retractions. Breath sounds are clear and equal bilaterally. No wheezes/rales/rhonchi. Gastrointestinal: Soft and nontender. Normal bowel sounds Musculoskeletal: Nontender with normal range of motion in extremities.  Pain with range of motion of the right hip and tenderness over the midline lumbar spine Neurologic:  Normal speech and language. No gross focal neurologic deficits are appreciated.  Skin:  Skin is warm, dry and intact. No rash noted. Psychiatric: Mood and affect are normal. Speech and behavior are normal.  ____________________________________________  EKG: Interpreted by me.  Atrial fibrillation with a rapid ventricular response, rate is 124 bpm, possible RVH, normal axis  ____________________________________________  ED COURSE:  As part of my medical decision making, I reviewed the following data within the  electronic MEDICAL RECORD NUMBER History obtained from family if available, nursing notes, old chart and ekg, as well as notes from prior ED visits. Patient presented for low back pain and right hip pain, we will assess with labs and imaging as indicated at this time. Clinical Course as of Aug 03 1436  Tue Aug 04, 2018  0012 Leukocytes,Ua: NEGATIVE [SS]    Clinical Course User Index [SS] Arta Silence, MD    Procedures ____________________________________________   LABS (pertinent positives/negatives)  Labs Reviewed  CBC WITH DIFFERENTIAL/PLATELET - Abnormal; Notable for the following components:      Result Value   WBC 21.6 (*)    Hemoglobin 15.8 (*)    HCT 46.7 (*)    Neutro Abs 18.7 (*)    Monocytes Absolute 1.6 (*)    Abs Immature Granulocytes 0.12 (*)    All other components within normal limits  COMPREHENSIVE METABOLIC PANEL - Abnormal; Notable for the following components:   CO2 19 (*)    Glucose, Bld 141 (*)    BUN 29 (*)    Creatinine, Ser 1.04 (*)    GFR calc non Af Amer 50 (*)    GFR calc Af Amer 58 (*)    All other components within normal limits  URINALYSIS, COMPLETE (UACMP) WITH MICROSCOPIC - Abnormal; Notable for the following components:   Color, Urine YELLOW (*)    APPearance CLEAR (*)    Hgb urine dipstick MODERATE (*)    Protein, ur 30 (*)    Bacteria, UA RARE (*)    All other components within normal limits  BASIC METABOLIC PANEL - Abnormal; Notable for the following components:   Calcium 8.6 (*)    GFR calc non Af Amer 55 (*)    All other components within normal limits  CBC - Abnormal; Notable for the following components:   WBC 14.1 (*)    MCV 102.2 (*)    All other components within normal limits  CULTURE, BLOOD (ROUTINE X 2)  CULTURE, BLOOD (ROUTINE X 2)  TROPONIN I  TSH  LACTIC ACID, PLASMA    RADIOLOGY Images were viewed by me  Lumbar spine x-ray, right hip x-ray Did not reveal any acute process ____________________________________________   DIFFERENTIAL DIAGNOSIS   Musculoskeletal pain, sciatica, spasm, dehydration, electrolyte abnormality, occult infection  FINAL ASSESSMENT AND PLAN  Hip pain, low back pain, atrial fibrillation with a rapid ventricular response   Plan: The patient had presented for low back pain and right hip pain. Patient's labs are still pending at this time. Patient's imaging regarding plain imaging was  unremarkable.  I have ordered MRI of the hip and lumbar spine.   Laurence Aly, MD    Note: This note was generated in part or whole with voice recognition software. Voice recognition is usually quite accurate but there are transcription errors that can and very often do occur. I apologize for any typographical errors that were not detected and corrected.     Earleen Newport, MD 08/04/18 [phone]

## 2018-08-03 NOTE — ED Notes (Signed)
ED Provider at bedside. 

## 2018-08-03 NOTE — ED Triage Notes (Signed)
She arrives today via [facility] from home with reports of back and hip pain that began two days ago  Pt denies any injury  Tachycardic upon arrival   10/10 pain reported upon triage

## 2018-08-03 NOTE — ED Notes (Signed)
MRI staff coming to get patient for MRI.

## 2018-08-04 ENCOUNTER — Encounter: Payer: Self-pay | Admitting: Neurology

## 2018-08-04 LAB — CBC
HCT: 41.7 % (ref 36.0–46.0)
Hemoglobin: 13.4 g/dL (ref 12.0–15.0)
MCH: 32.8 pg (ref 26.0–34.0)
MCHC: 32.1 g/dL (ref 30.0–36.0)
MCV: 102.2 fL — ABNORMAL HIGH (ref 80.0–100.0)
Platelets: 212 10*3/uL (ref 150–400)
RBC: 4.08 MIL/uL (ref 3.87–5.11)
RDW: 14.6 % (ref 11.5–15.5)
WBC: 14.1 10*3/uL — ABNORMAL HIGH (ref 4.0–10.5)
nRBC: 0 % (ref 0.0–0.2)

## 2018-08-04 LAB — BASIC METABOLIC PANEL
Anion gap: 7 (ref 5–15)
BUN: 22 mg/dL (ref 8–23)
CO2: 25 mmol/L (ref 22–32)
Calcium: 8.6 mg/dL — ABNORMAL LOW (ref 8.9–10.3)
Chloride: 106 mmol/L (ref 98–111)
Creatinine, Ser: 0.97 mg/dL (ref 0.44–1.00)
GFR calc non Af Amer: 55 mL/min — ABNORMAL LOW (ref >60)
Glucose, Bld: 96 mg/dL (ref 70–99)
Potassium: 4.2 mmol/L (ref 3.5–5.1)
Sodium: 138 mmol/L (ref 135–145)

## 2018-08-04 MED ORDER — ENOXAPARIN SODIUM 40 MG/0.4ML ~~LOC~~ SOLN: 40.0000 mg | SUBCUTANEOUS | Status: DC

## 2018-08-04 MED ORDER — METHOTREXATE 2.5 MG PO TABS
15.0000 mg | ORAL_TABLET | ORAL | Status: DC
  Administered 2018-08-04: 15 mg via ORAL
  Filled 2018-08-04: qty 6

## 2018-08-04 MED ORDER — ONDANSETRON HCL 4 MG/2ML IJ SOLN: 4.0000 mg | Freq: Four times a day (QID) | INTRAMUSCULAR | Status: DC | PRN

## 2018-08-04 MED ORDER — ONDANSETRON HCL 4 MG PO TABS: 4.0000 mg | ORAL_TABLET | Freq: Four times a day (QID) | ORAL | Status: DC | PRN

## 2018-08-04 MED ORDER — KETOROLAC TROMETHAMINE 30 MG/ML IJ SOLN
30.0000 mg | Freq: Three times a day (TID) | INTRAMUSCULAR | Status: AC
  Administered 2018-08-04 – 2018-08-05 (×4): 30 mg via INTRAVENOUS
  Filled 2018-08-04 (×4): qty 1

## 2018-08-04 MED ORDER — DOXYCYCLINE HYCLATE 100 MG PO TABS
100.0000 mg | ORAL_TABLET | Freq: Two times a day (BID) | ORAL | Status: DC
  Administered 2018-08-04 – 2018-08-06 (×4): 100 mg via ORAL
  Filled 2018-08-04 (×6): qty 1

## 2018-08-04 MED ORDER — SIMVASTATIN 20 MG PO TABS
20.0000 mg | ORAL_TABLET | Freq: Every day | ORAL | Status: DC
  Administered 2018-08-05: 20 mg via ORAL
  Filled 2018-08-04: qty 1

## 2018-08-04 MED ORDER — ASPIRIN EC 81 MG PO TBEC
81.0000 mg | DELAYED_RELEASE_TABLET | Freq: Every day | ORAL | Status: DC
  Administered 2018-08-04 – 2018-08-05 (×3): 81 mg via ORAL
  Filled 2018-08-04 (×3): qty 1

## 2018-08-04 MED ORDER — ACETAMINOPHEN 325 MG PO TABS: 650.0000 mg | ORAL_TABLET | Freq: Four times a day (QID) | ORAL | Status: DC | PRN

## 2018-08-04 MED ORDER — MORPHINE SULFATE (PF) 2 MG/ML IV SOLN: 2.0000 mg | INTRAVENOUS | Status: DC | PRN

## 2018-08-04 MED ORDER — DILTIAZEM HCL ER COATED BEADS 120 MG PO CP24
120.0000 mg | ORAL_CAPSULE | Freq: Every day | ORAL | Status: DC
  Administered 2018-08-04 – 2018-08-06 (×3): 120 mg via ORAL
  Filled 2018-08-04 (×3): qty 1

## 2018-08-04 MED ORDER — INFLUENZA VAC SPLIT HIGH-DOSE 0.5 ML IM SUSY
0.5000 mL | PREFILLED_SYRINGE | INTRAMUSCULAR | Status: AC
  Administered 2018-08-05: 0.5 mL via INTRAMUSCULAR
  Filled 2018-08-04: qty 0.5

## 2018-08-04 MED ORDER — FOLIC ACID 1 MG PO TABS
1.0000 mg | ORAL_TABLET | Freq: Every day | ORAL | Status: DC
  Administered 2018-08-04 – 2018-08-06 (×3): 1 mg via ORAL
  Filled 2018-08-04 (×3): qty 1

## 2018-08-04 MED ORDER — VITAMIN D 25 MCG (1000 UNIT) PO TABS
1000.0000 [IU] | ORAL_TABLET | Freq: Every day | ORAL | Status: DC
  Administered 2018-08-04 – 2018-08-06 (×3): 1000 [IU] via ORAL
  Filled 2018-08-04 (×3): qty 1

## 2018-08-04 MED ORDER — VITAMIN B-12 1000 MCG PO TABS
1000.0000 ug | ORAL_TABLET | Freq: Every day | ORAL | Status: DC
  Administered 2018-08-04 – 2018-08-06 (×3): 1000 ug via ORAL
  Filled 2018-08-04 (×3): qty 1

## 2018-08-04 MED ORDER — APIXABAN 5 MG PO TABS
5.0000 mg | ORAL_TABLET | Freq: Two times a day (BID) | ORAL | Status: DC
  Administered 2018-08-04 – 2018-08-06 (×6): 5 mg via ORAL
  Filled 2018-08-04 (×6): qty 1

## 2018-08-04 MED ORDER — ACETAMINOPHEN 650 MG RE SUPP: 650.0000 mg | Freq: Four times a day (QID) | RECTAL | Status: DC | PRN

## 2018-08-04 MED ORDER — OXYCODONE HCL 5 MG PO TABS
5.0000 mg | ORAL_TABLET | ORAL | Status: DC | PRN
  Administered 2018-08-04 – 2018-08-05 (×4): 5 mg via ORAL
  Filled 2018-08-04 (×4): qty 1

## 2018-08-04 NOTE — Evaluation (Signed)
Occupational Therapy Evaluation Patient Details Name: Angela Leonard MRN: [id number] DOB: 07-25-1934 Today's Date: 08/04/2018    History of Present Illness Pt is an 84 y.o. female presenting to hospital with R hip pain, back pain, difficulty ambulating, and noted with tachycardia.  Pt admitted with sepsis, atypical PNA, and R hip arthritis (imaging negative for R hip fx).  PMH includes htn, HLD, back surgery, and colon surgery.  Of note, prior PT note in 08/2017 noting hospital admit for worsening R hip pain (pt reports pain comes and goes but is currently worse than it usually gets).   Clinical Impression   Pt seen for OT evaluation this date. Prior to hospital admission, pt was relatively independent with ADL and IADL (dtr drives and does med mgt) and pt denies additional falls in past 12 months. Currently pt demonstrates impairments in orientation, safety awareness, weakness, and pain requiring MAX assist for LB ADL and MOD-MAX assist for functional mobility. Pt with significant R hip and R knee pain at start of session with pt wincing with attempts to bend the R knee for a heel slide. Further mobility attempts deferred 2/2 pain. Pt instructed in AE/DME to improve safety/indep with ADL tasks. Pt would benefit from skilled OT to address noted impairments and functional limitations (see below for any additional details) in order to maximize safety and independence while minimizing falls risk and caregiver burden.  Upon hospital discharge, recommend pt discharge to [place].    Follow Up Recommendations  SNF    Equipment Recommendations  (TBD)    Recommendations for Other Services       Precautions / Restrictions Precautions Precautions: Fall Restrictions Weight Bearing Restrictions: No      Mobility Bed Mobility     General bed mobility comments: pt unable to attempt 2/2 significant R hip/knee pain with any movement  Transfers         General transfer comment: pt unable to attempt  2/2 significant R hip/knee pain with any movement    Balance Overall balance assessment: Needs assistance                             ADL either performed or assessed with clinical judgement   ADL Overall ADL's : Needs assistance/impaired Eating/Feeding: Sitting;Set up   Grooming: Sitting;Supervision/safety   Upper Body Bathing: Sitting;Minimal assistance   Lower Body Bathing: Sit to/from stand;Maximal assistance   Upper Body Dressing : Sitting;Minimal assistance   Lower Body Dressing: Sit to/from stand;Maximal assistance                       Vision Baseline Vision/History: Wears glasses Wears Glasses: Reading only Patient Visual Report: No change from baseline       Perception     Praxis      Pertinent Vitals/Pain Pain Assessment: 0-10 Pain Score: 7  Pain Location: R hip, R knee Pain Descriptors / Indicators: Aching;Sore Pain Intervention(s): Limited activity within patient's tolerance;Monitored during session;Repositioned     Hand Dominance Right   Extremity/Trunk Assessment Upper Extremity Assessment Upper Extremity Assessment: Generalized weakness(grossly 4-/5 bilaterally)   Lower Extremity Assessment Lower Extremity Assessment: Defer to PT evaluation;LLE deficits/detail;RLE deficits/detail(difficult to assess 2/2 RLE pain) RLE Deficits / Details: AAROM R hip flexion to 90 degrees; fair R quad strength; at least 3/5 DF/PF RLE: Unable to fully assess due to pain LLE Deficits / Details: strength and ROM WFL   Cervical / Trunk Assessment Cervical /  Trunk Assessment: Normal   Communication Communication Communication: No difficulties   Cognition Arousal/Alertness: Awake/alert Behavior During Therapy: WFL for tasks assessed/performed Overall Cognitive Status: No family/caregiver present to determine baseline cognitive functioning                                 General Comments: pleasant, follows simple commands well,  cues for multi step commands, oriented to self, situation, "hospital"    General Comments       Exercises Other Exercises Other Exercises: pt instructed in AE/DME to improve safety/indep with ADL tasks, pt having too much pain to trial this date   Shoulder Instructions      Home Living Family/patient expects to be discharged to:: Private residence Living Arrangements: Children(pt's daughter) Available Help at Discharge: Family Type of Home: House Home Access: Stairs to enter Technical brewer of Steps: 2 Entrance Stairs-Rails: None Home Layout: One level     Bathroom Shower/Tub: Teacher, early years/pre: [place]: Environmental consultant - 2 wheels;Toilet riser;Shower seat          Prior Functioning/Environment Level of Independence: Independent        Comments: Pt reports no falls in past 12 months.  Occasional use of SPC. Denies falls. Daughter does all driving, sets up medications (pt endorses occasional forgetfullness), indep with ADL/IADL otherwise        OT Problem List: Decreased strength;Decreased range of motion;Decreased knowledge of use of DME or AE;Decreased cognition;Pain;Impaired balance (sitting and/or standing)      OT Treatment/Interventions: Self-care/ADL training;Balance training;Therapeutic exercise;Therapeutic activities;DME and/or AE instruction;Patient/family education    OT Goals(Current goals can be found in the care plan section) Acute Rehab OT Goals Patient Stated Goal: to be able to walk again and have less R hip pain OT Goal Formulation: With patient Time For Goal Achievement: 08/18/18 Potential to Achieve Goals: Good ADL Goals Pt Will Perform Lower Body Dressing: with mod assist;sit to/from stand(AE as needed) Pt Will Transfer to Toilet: with mod assist;bedside commode;ambulating(LRAD for amb) Additional ADL Goal #1: Pt will perform bed mobility in preparation for seated ADL with Mod Assist and reporting <5/10 RLE  pain.  OT Frequency: Min 2X/week   Barriers to D/C:            Co-evaluation              AM-PAC OT "6 Clicks" Daily Activity     Outcome Measure Help from another person eating meals?: None Help from another person taking care of personal grooming?: None Help from another person toileting, which includes using toliet, bedpan, or urinal?: A Lot Help from another person bathing (including washing, rinsing, drying)?: A Lot Help from another person to put on and taking off regular upper body clothing?: A Little Help from another person to put on and taking off regular lower body clothing?: A Lot 6 Click Score: 17   End of Session    Activity Tolerance: Patient limited by pain Patient left: in bed;with call [place]/phone within reach;with bed alarm set;with SCD's reapplied  OT Visit Diagnosis: Other abnormalities of gait and mobility (R26.89);Muscle weakness (generalized) (M62.81);Pain Pain - Right/Left: Right Pain - part of body: Hip;Knee                Time: 1336-1350 OT Time Calculation (min): 14 min Charges:  OT General Charges $OT Visit: 1 Visit OT Evaluation $OT Eval Moderate Complexity: 1  Mod OT Treatments $Self Care/Home Management : 8-22 mins  Jeni Salles, MPH, MS, OTR/L ascom [phone] 08/04/18, 2:15 PM

## 2018-08-04 NOTE — ED Provider Notes (Signed)
-----------------------------------------   12:11 AM on 08/04/2018 -----------------------------------------  I took over care on this patient from Dr. Jimmye Norman.  The patient presented with acute on chronic right hip and low back pain causing her to be unable to walk.  Initial presentation including her tachycardia was concerning for possible infection/sepsis.  X-rays of the right hip showed no acute fracture.  Dr. Jimmye Norman had ordered an MRI to evaluate for possible septic joint.  The MRI shows effusion but no specific evidence to suggest infection.  The patient's lab work-up is relatively reassuring.  She does have an elevated WBC count but normal lactate and troponin.  The UA is negative.  Chest x-ray shows bronchitic changes and interstitial opacity.  On reassessment the patient appears relatively comfortable although she still has borderline tachycardia and a slightly low blood pressure.  Given that she may have acute bronchitis or atypical pneumonia, borderline vital signs, and continued right hip pain causing her to be unable to ambulate, she will require admission.  I ordered azithromycin for coverage for atypical pneumonia.  I signed her out to the hospitalist Dr. Jannifer Franklin.   Arta Silence, MD 08/04/18 [phone]

## 2018-08-04 NOTE — Evaluation (Signed)
Physical Therapy Evaluation Patient Details Name: Angela Leonard MRN: [id number] DOB: April 17, 1935 Today's Date: 08/04/2018   History of Present Illness  Pt is an 84 y.o. female presenting to hospital with R hip pain, back pain, difficulty ambulating, and noted with tachycardia.  Pt admitted with sepsis, atypical PNA, and R hip arthritis (imaging negative for R hip fx).  PMH includes htn, HLD, back surgery, and colon surgery.  Of note, prior PT note in 08/2017 noting hospital admit for worsening R hip pain (pt reports pain comes and goes but is currently worse than it usually gets).  Clinical Impression  Prior to hospital admission, pt was ambulatory (occasional use of SPC).  Pt lives with her daughter in 1 level home with steps to enter.  Currently pt requires 1-2 assist with bed mobility; mod to max assist to stand from bed; and pt unable to take any steps forward with 1 assist and use of RW d/t c/o significant R hip pain.  Pain R hip 8/10 beginning of session at rest (nurse notified and gave pt pain meds during session); increased to 10/10 with activity; and decreased to 2/10 end of session resting in bed.  Pt would benefit from skilled PT to address noted impairments and functional limitations (see below for any additional details).  Upon hospital discharge, recommend pt discharge to [place].    Follow Up Recommendations SNF    Equipment Recommendations  Rolling walker with 5" wheels;3in1 (PT)    Recommendations for Other Services OT consult     Precautions / Restrictions Precautions Precautions: Fall Restrictions Weight Bearing Restrictions: No      Mobility  Bed Mobility Overal bed mobility: Needs Assistance Bed Mobility: Supine to Sit;Sit to Supine     Supine to sit: Mod assist;Max assist;HOB elevated     General bed mobility comments: assist for trunk and R LE semi-supine to sit (increased time to perform d/t R hip pain); 2 assist (d/t R hip pain) sit to supine (and to boost up in  bed)  Transfers Overall transfer level: Needs assistance Equipment used: Rolling walker (2 wheeled) Transfers: Sit to/from Stand Sit to Stand: Mod assist;Max assist         General transfer comment: pt unable to stand 1st 2 trials (d/t R hip pain) but then able to stand 3rd and 4th trial with mod to max assist x1 to initiate stand; vc's for UE/LE placement; limited d/t R hip pain; vc's and assist required to control descent sitting (pt sitting quickly onto bed each time)  Ambulation/Gait     Assistive device: Rolling walker (2 wheeled)       General Gait Details: pt unable to advance R LE or take any step forward with L LE d/t c/o significant R hip pain with R LE WB'ing (pt given vc's to increase UE support through RW to offweight R LE but still unable d/t R hip pain)  Stairs            Wheelchair Mobility    Modified Rankin (Stroke Patients Only)       Balance Overall balance assessment: Needs assistance Sitting-balance support: No upper extremity supported;Feet supported Sitting balance-[name] Scale: Fair Sitting balance - Comments: steady static sitting; mild L lean noted d/t R hip pain Postural control: Left lateral lean   Standing balance-[name] Scale: Poor Standing balance comment: pt requiring B UE support on RW for static standing balance  Pertinent Vitals/Pain Pain Assessment: 0-10 Pain Score: 2  Pain Location: R hip Pain Descriptors / Indicators: Aching;Sore Pain Intervention(s): Limited activity within patient's tolerance;Monitored during session;Repositioned;Patient requesting pain meds-RN notified;RN gave pain meds during session  O2 sats WFL during session. HR in 90's bpm at rest and increased up to 123 bpm with activity (HR decreasing back towards resting HR end of session)    Home Living Family/patient expects to be discharged to:: Private residence Living Arrangements: Children(pt's daughter) Available  Help at Discharge: Family Type of Home: House Home Access: Stairs to enter Entrance Stairs-Rails: None Entrance Stairs-Number of Steps: 2 Home Layout: One level Home Equipment: Environmental consultant - 2 wheels;Toilet riser;Shower seat      Prior Function Level of Independence: Independent         Comments: Pt reports no falls in past 12 months.  Occasional use of SPC.     Hand Dominance        Extremity/Trunk Assessment   Upper Extremity Assessment Upper Extremity Assessment: Defer to OT evaluation;Generalized weakness    Lower Extremity Assessment Lower Extremity Assessment: RLE deficits/detail;LLE deficits/detail RLE Deficits / Details: AAROM R hip flexion to 90 degrees; fair R quad strength; at least 3/5 DF/PF RLE: Unable to fully assess due to pain LLE Deficits / Details: strength and ROM WFL    Cervical / Trunk Assessment Cervical / Trunk Assessment: Normal  Communication   Communication: No difficulties  Cognition Arousal/Alertness: Awake/alert Behavior During Therapy: WFL for tasks assessed/performed Overall Cognitive Status: No family/caregiver present to determine baseline cognitive functioning                                 General Comments: Oriented to person, place, situation, and month (not day)      General Comments   Nursing cleared pt for participation in physical therapy.  Pt agreeable to PT session.    Exercises  Transfer training   Assessment/Plan    PT Assessment Patient needs continued PT services  PT Problem List Decreased strength;Decreased range of motion;Decreased activity tolerance;Decreased balance;Decreased mobility;Decreased knowledge of use of DME;Decreased knowledge of precautions;Pain       PT Treatment Interventions DME instruction;Gait training;Stair training;Functional mobility training;Therapeutic activities;Therapeutic exercise;Balance training;Patient/family education    PT Goals (Current goals can be found in the  Care Plan section)  Acute Rehab PT Goals Patient Stated Goal: to be able to walk again and have less R hip pain PT Goal Formulation: With patient Time For Goal Achievement: 08/18/18 Potential to Achieve Goals: Fair    Frequency Min 2X/week   Barriers to discharge Decreased caregiver support      Co-evaluation               AM-PAC PT "6 Clicks" Mobility  Outcome Measure Help needed turning from your back to your side while in a flat bed without using bedrails?: A Lot Help needed moving from lying on your back to sitting on the side of a flat bed without using bedrails?: A Lot Help needed moving to and from a bed to a chair (including a wheelchair)?: A Lot Help needed standing up from a chair using your arms (e.g., wheelchair or bedside chair)?: A Lot Help needed to walk in hospital room?: Total Help needed climbing 3-5 steps with a railing? : Total 6 Click Score: 10    End of Session Equipment Utilized During Treatment: Gait belt Activity Tolerance: Patient limited by pain Patient  left: in bed;with call [place]/phone within reach;with bed alarm set;Other (comment)(B heels elevated via pillow) Nurse Communication: Mobility status;Patient requests pain meds;Precautions PT Visit Diagnosis: Other abnormalities of gait and mobility (R26.89);Muscle weakness (generalized) (M62.81);Difficulty in walking, not elsewhere classified (R26.2);Pain Pain - Right/Left: Right Pain - part of body: Hip    Time: 0623-7628 PT Time Calculation (min) (ACUTE ONLY): 35 min   Charges:   PT Evaluation $PT Eval Low Complexity: 1 Low PT Treatments $Therapeutic Activity: 8-22 mins       Leitha Bleak, PT 08/04/18, 11:54 AM [phone]

## 2018-08-04 NOTE — Progress Notes (Signed)
[facility] [place] NAME: Syndey Jaskolski    MR#:  [id number]  DATE OF BIRTH:  Apr 30, 1935  SUBJECTIVE:  CHIEF COMPLAINT:   Chief Complaint  Patient presents with  . Hip Pain  . Back Pain   Continues to have severe pain in right groin area No shortness of breath afebrile  REVIEW OF SYSTEMS:    Review of Systems  Constitutional: Positive for malaise/fatigue. Negative for chills and fever.  HENT: Negative for sore throat.   Eyes: Negative for blurred vision, double vision and pain.  Respiratory: Positive for cough. Negative for hemoptysis, shortness of breath and wheezing.   Cardiovascular: Negative for chest pain, palpitations, orthopnea and leg swelling.  Gastrointestinal: Negative for abdominal pain, constipation, diarrhea, heartburn, nausea and vomiting.  Genitourinary: Negative for dysuria and hematuria.  Musculoskeletal: Positive for back pain and joint pain.  Skin: Negative for rash.  Neurological: Negative for sensory change, speech change, focal weakness and headaches.  Endo/Heme/Allergies: Does not bruise/bleed easily.  Psychiatric/Behavioral: Negative for depression. The patient is not nervous/anxious.     DRUG ALLERGIES:  No Known Allergies  VITALS:  Blood pressure 112/65, pulse 89, temperature 97.7 F (36.5 C), temperature source Oral, resp. rate 20, height 5\' 5"  (1.651 m), weight 77.1 kg, SpO2 97 %.  PHYSICAL EXAMINATION:   Physical Exam  GENERAL:  84 y.o.-year-old patient lying in the bed with no acute distress.  EYES: Pupils equal, round, reactive to light and accommodation. No scleral icterus. Extraocular muscles intact.  HEENT: Head atraumatic, normocephalic. Oropharynx and nasopharynx clear.  NECK:  Supple, no jugular venous distention. No thyroid enlargement, no tenderness.  LUNGS: Normal breath sounds bilaterally, no wheezing, rales, rhonchi. No use of accessory muscles of respiration.  CARDIOVASCULAR: S1, S2  normal. No murmurs, rubs, or gallops.  ABDOMEN: Soft, nontender, nondistended. Bowel sounds present. No organomegaly or mass.  EXTREMITIES: No cyanosis, clubbing or edema b/l.    NEUROLOGIC: Cranial nerves II through XII are intact.  Limited assessment of right lower extremity due to severe pain in right hip and thigh. PSYCHIATRIC: The patient is alert and oriented x 3.  SKIN: No obvious rash, lesion, or ulcer.   LABORATORY PANEL:   CBC Recent Labs  Lab 08/04/18 0521  WBC 14.1*  HGB 13.4  HCT 41.7  PLT 212   ------------------------------------------------------------------------------------------------------------------ Chemistries  Recent Labs  Lab 08/03/18 1426 08/04/18 0521  NA 135 138  K 4.1 4.2  CL 102 106  CO2 19* 25  GLUCOSE 141* 96  BUN 29* 22  CREATININE 1.04* 0.97  CALCIUM 9.2 8.6*  AST 31  --   ALT 24  --   ALKPHOS 53  --   BILITOT 0.8  --    ------------------------------------------------------------------------------------------------------------------  Cardiac Enzymes Recent Labs  Lab 08/03/18 1426  TROPONINI <0.03   ------------------------------------------------------------------------------------------------------------------  RADIOLOGY:  Dg Lumbar Spine 2-3 Views  Result Date: 08/03/2018 CLINICAL DATA:  Low back and right hip pain EXAM: LUMBAR SPINE - 2-3 VIEW COMPARISON:  MRI 09/15/2017 FINDINGS: Grade 1 L4-5 anterolisthesis. Multilevel mild facet hypertrophy. Vertebral body heights are maintained. Mild L5-S1 disc space narrowing. Calcification of the abdominal aorta. IMPRESSION: Grade 1 degenerative anterolisthesis at L4-5.  No acute abnormality. Electronically Signed   By: Ulyses Jarred M.D.   On: 08/03/2018 15:44   Mr Lumbar Spine W Wo Contrast  Result Date: 08/03/2018 CLINICAL DATA:  Prior lumbar surgery. Low back and right hip pain over the last 2 days. EXAM: MRI  LUMBAR SPINE WITHOUT AND WITH CONTRAST TECHNIQUE: Multiplanar and multiecho  pulse sequences of the lumbar spine were obtained without and with intravenous contrast. CONTRAST:  7 cc Gadavist COMPARISON:  None. FINDINGS: Segmentation:  5 lumbar type vertebral bodies. Alignment:  6 mm anterolisthesis L4-5.  2 mm anterolisthesis L5-S1. Vertebrae:  No fracture or primary bone lesion. Conus medullaris and cauda equina: Conus extends to the L1 level. Conus and cauda equina appear normal. Paraspinal and other soft tissues: Negative Disc levels: T11-12 and T12-L1: Normal. L1-2: Central disc herniation indents the thecal sac but does not cause visible neural compression. L2-3: Mild bulging of the disc. Mild ligamentous prominence. No compressive stenosis. L3-4: Bulging of the disc more prominent towards the left. Mild facet and ligamentous prominence. Mild narrowing of the left lateral recess but without visible neural compression. Bilateral foraminal narrowing with some potential to cause irritation of either L3 nerve. L4-5: Previous left hemilaminectomy. Bilateral facet arthropathy allowing 6 mm of anterolisthesis. Shallow protrusion of the disc. Left foraminal stenosis due to encroachment by osteophyte in disc material could compress the left L4 nerve. L5-S1: Facet osteoarthritis with 2 mm of anterolisthesis. Shallow protrusion of the disc. No compressive canal or foraminal stenosis. IMPRESSION: L1-2: Central disc herniation indents the thecal sac but does not affect the neural structures. L3-4: Bulging of the disc more prominent towards the left. Bilateral facet osteoarthritis. Left lateral recess narrowing that could possibly affect the left L4 nerve. Bilateral foraminal encroachment by osteophytes and disc material would have some potential to irritate either L3 nerve, though definite compression is not established. L4-5: Previous left hemilaminectomy. 6 mm anterolisthesis. Shallow protrusion of the disc. Left foraminal stenosis that could affect the exiting left L4 nerve. L5-S1: 2 mm  degenerative anterolisthesis. Bulging of the disc. No apparent compressive stenosis. Electronically Signed   By: Nelson Chimes M.D.   On: 08/03/2018 20:35   Mr Hip Right W Wo Contrast  Result Date: 08/03/2018 CLINICAL DATA:  Right hip pain over the last 2 days. EXAM: MRI OF THE RIGHT HIP WITHOUT AND WITH CONTRAST TECHNIQUE: Multiplanar, multisequence MR imaging was performed both before and after administration of intravenous contrast. CONTRAST:  7 cc Gadavist COMPARISON:  Radiography same day.  MRI 09/15/2017. FINDINGS: Bones: Very similar appearance to the previous study. No fracture or primary bone lesion. No avascular necrosis. Articular cartilage and labrum Articular cartilage: Redemonstration of asymmetric osteoarthritis of the right hip more than the left. Femoral head osteophytes. No focal cartilage defect or subchondral edema. Labrum:  Limited detail.  No gross labral pathology identified. Joint or bursal effusion Joint effusion: Small to moderate size effusion on the right, probably minimally larger than on the previous study. Normal joint fluid on the left. Bursae: Negative Muscles and tendons Muscles and tendons:  Negative Other findings Miscellaneous: Right-sided abdominal hernia as demonstrated previously, containing intestine and fat. Redemonstration of fluid within the endometrial canal. IMPRESSION: As seen last year, there is asymmetric osteoarthritis of the right hip with joint space narrowing, osteophytes and a moderate right hip effusion. This could be symptomatic. The effusion may be slightly larger than was noted last year. No evidence of fracture or new pathologic process. Redemonstration of a right abdominal wall hernia and fluid within the endometrial canal, both similar to the study of last year. Electronically Signed   By: Nelson Chimes M.D.   On: 08/03/2018 20:41   Dg Chest Portable 1 View  Result Date: 08/03/2018 CLINICAL DATA:  Tachycardia EXAM: PORTABLE CHEST 1 VIEW  COMPARISON:   None. FINDINGS: Diffuse interstitial coarsening. Mild cardiomegaly. No focal airspace consolidation. No pleural effusion or pneumothorax. IMPRESSION: Bronchitic type diffuse interstitial opacities. Electronically Signed   By: Ulyses Jarred M.D.   On: 08/03/2018 17:03   Dg Hip Unilat W Or Wo Pelvis 2-3 Views Right  Result Date: 08/03/2018 CLINICAL DATA:  84 year old female with back and right hip pain beginning 2 days ago. No known injury. EXAM: DG HIP (WITH OR WITHOUT PELVIS) 2-3V RIGHT COMPARISON:  09/14/2017. FINDINGS: Femoral heads remain normally located. Asymmetric right hip joint space loss, subchondral sclerosis and spurring redemonstrated. The pelvis appears stable and intact. Aortoiliac calcified atherosclerosis. Stable bowel gas pattern. Proximal right femur appears stable and intact. Bilateral lower extremity calcified peripheral vascular disease. IMPRESSION: 1. No acute osseous abnormality identified about the right hip or pelvis. 2. Moderate asymmetric right hip osteoarthritis. Electronically Signed   By: Genevie Ann M.D.   On: 08/03/2018 15:42     ASSESSMENT AND PLAN:     Sepsis (Zeba) -antibiotics given, sepsis would be due to atypical pneumonia, lactic acid within normal limits, blood pressure stable, culture sent    Arthritis of right hip -PRN analgesia, continue home dose methotrexate, PT OT consult MRI shows no infection.  Add IV Toradol.    HTN (hypertension) -continue home meds   HLD (hyperlipidemia) -home dose antilipid  Likely discharge in a.m. to skilled nursing facility.  All the records are reviewed and case discussed with Care Management/Social Worker Management plans discussed with the patient, family and they are in agreement.  CODE STATUS: FULL CODE  DVT Prophylaxis: SCDs  TOTAL TIME TAKING CARE OF THIS PATIENT: 35 minutes.   POSSIBLE D/C IN 1-2 DAYS, DEPENDING ON CLINICAL CONDITION.  Leia Alf Zorina Mallin M.D on 08/04/2018 at 2:33 PM  Between 7am to 6pm - Pager -  [phone]  After 6pm go to [email or web address] - password [email or web address] [facility]  Office  [phone]  CC: Primary care physician; Derinda Late, MD  Note: This dictation was prepared with Dragon dictation along with smaller phrase technology. Any transcriptional errors that result from this process are unintentional.

## 2018-08-04 NOTE — ED Notes (Signed)
Pt assisted with bed pan. Peri-care performed and pt repositioned in bed. This RN will continue to monitor.

## 2018-08-05 NOTE — Consult Note (Signed)
ORTHOPAEDIC CONSULTATION  REQUESTING PHYSICIAN: Hillary Bow, MD  Chief Complaint:   R hip pain  History of Present Illness: Angela Leonard is a 84 y.o. female was admitted for infectious workup because of leukocytosis and tachycardia with concern for pneumonia. She additionally has a painful R hip. I had evaluated her in April 2019 for a similar presentation but recommended conservative management for osteoarthritis. She states her hip pain has been worsening over the past few weeks. No acute injury. Pain is a 5/10. Improved with rest, worse with increased activity. Pain is located in the groin region.   Past Medical History:  Diagnosis Date  . HLD (hyperlipidemia)   . Hypertension    Past Surgical History:  Procedure Laterality Date  . BACK SURGERY    . CHOLECYSTECTOMY N/A 10/14/2014   Procedure: LAPAROSCOPIC CHOLECYSTECTOMY;  Surgeon: Marlyce Huge, MD;  Location: [facility] ORS;  Service: General;  Laterality: N/A;  . COLON SURGERY     Social History   Socioeconomic History  . Marital status: Widowed    Spouse name: Not on file  . Number of children: Not on file  . Years of education: Not on file  . Highest education level: Not on file  Occupational History  . Not on file  Social Needs  . Financial resource strain: Not on file  . Food insecurity:    Worry: Not on file    Inability: Not on file  . Transportation needs:    Medical: Not on file    Non-medical: Not on file  Tobacco Use  . Smoking status: Former Research scientist (life sciences)  . Smokeless tobacco: Never Used  Substance and Sexual Activity  . Alcohol use: No  . Drug use: No  . Sexual activity: Not on file  Lifestyle  . Physical activity:    Days per week: Not on file    Minutes per session: Not on file  . Stress: Not on file  Relationships  . Social connections:    Talks on phone: Not on file    Gets together: Not on file    Attends religious service: Not  on file    Active member of club or organization: Not on file    Attends meetings of clubs or organizations: Not on file    Relationship status: Not on file  Other Topics Concern  . Not on file  Social History Narrative  . Not on file   Family History  Problem Relation Age of Onset  . Alzheimer's disease Mother   . Parkinson's disease Mother   . Cancer Father   . Heart disease Sister   . Parkinson's disease Brother   . Heart disease Brother    No Known Allergies Prior to Admission medications   Medication Sig Start Date End Date Taking? Authorizing Provider  CARTIA XT 120 MG 24 hr capsule Take 120 mg by mouth daily. 05/04/18  Yes [provider]  ELIQUIS 5 MG TABS tablet Take 5 mg by mouth every 12 (twelve) hours.   Yes [provider]  folic acid (FOLVITE) 1 MG tablet Take 1 mg by mouth daily.   Yes [provider]  methotrexate (RHEUMATREX) 2.5 MG tablet Take 6 tablets by mouth every Tuesday.    Yes [provider]  ramipril (ALTACE) 10 MG capsule Take 10 mg by mouth daily.   Yes [provider]  aspirin EC 81 MG tablet Take 81 mg by mouth at bedtime.    [provider]  cholecalciferol (VITAMIN D) 1000  units tablet Take 1,000 Units by mouth daily.    [provider]  Multiple Vitamin (MULTIVITAMIN WITH MINERALS) TABS tablet Take 1 tablet by mouth daily.    [provider]  simvastatin (ZOCOR) 20 MG tablet Take 20 mg by mouth at bedtime.    [provider]  vitamin B-12 (CYANOCOBALAMIN) 1000 MCG tablet Take 1,000 mcg by mouth daily.    [provider]   Recent Labs    08/03/18 1426 08/04/18 0521  WBC 21.6* 14.1*  HGB 15.8* 13.4  HCT 46.7* 41.7  PLT 272 212  K 4.1 4.2  CL 102 106  CO2 19* 25  BUN 29* 22  CREATININE 1.04* 0.97  GLUCOSE 141* 96  CALCIUM 9.2 8.6*   Mr Lumbar Spine W Wo Contrast  Result Date: 08/03/2018 CLINICAL DATA:  Prior lumbar surgery. Low back and right hip  pain over the last 2 days. EXAM: MRI LUMBAR SPINE WITHOUT AND WITH CONTRAST TECHNIQUE: Multiplanar and multiecho pulse sequences of the lumbar spine were obtained without and with intravenous contrast. CONTRAST:  7 cc Gadavist COMPARISON:  None. FINDINGS: Segmentation:  5 lumbar type vertebral bodies. Alignment:  6 mm anterolisthesis L4-5.  2 mm anterolisthesis L5-S1. Vertebrae:  No fracture or primary bone lesion. Conus medullaris and cauda equina: Conus extends to the L1 level. Conus and cauda equina appear normal. Paraspinal and other soft tissues: Negative Disc levels: T11-12 and T12-L1: Normal. L1-2: Central disc herniation indents the thecal sac but does not cause visible neural compression. L2-3: Mild bulging of the disc. Mild ligamentous prominence. No compressive stenosis. L3-4: Bulging of the disc more prominent towards the left. Mild facet and ligamentous prominence. Mild narrowing of the left lateral recess but without visible neural compression. Bilateral foraminal narrowing with some potential to cause irritation of either L3 nerve. L4-5: Previous left hemilaminectomy. Bilateral facet arthropathy allowing 6 mm of anterolisthesis. Shallow protrusion of the disc. Left foraminal stenosis due to encroachment by osteophyte in disc material could compress the left L4 nerve. L5-S1: Facet osteoarthritis with 2 mm of anterolisthesis. Shallow protrusion of the disc. No compressive canal or foraminal stenosis. IMPRESSION: L1-2: Central disc herniation indents the thecal sac but does not affect the neural structures. L3-4: Bulging of the disc more prominent towards the left. Bilateral facet osteoarthritis. Left lateral recess narrowing that could possibly affect the left L4 nerve. Bilateral foraminal encroachment by osteophytes and disc material would have some potential to irritate either L3 nerve, though definite compression is not established. L4-5: Previous left hemilaminectomy. 6 mm anterolisthesis. Shallow  protrusion of the disc. Left foraminal stenosis that could affect the exiting left L4 nerve. L5-S1: 2 mm degenerative anterolisthesis. Bulging of the disc. No apparent compressive stenosis. Electronically Signed   By: Nelson Chimes M.D.   On: 08/03/2018 20:35   Mr Hip Right W Wo Contrast  Result Date: 08/03/2018 CLINICAL DATA:  Right hip pain over the last 2 days. EXAM: MRI OF THE RIGHT HIP WITHOUT AND WITH CONTRAST TECHNIQUE: Multiplanar, multisequence MR imaging was performed both before and after administration of intravenous contrast. CONTRAST:  7 cc Gadavist COMPARISON:  Radiography same day.  MRI 09/15/2017. FINDINGS: Bones: Very similar appearance to the previous study. No fracture or primary bone lesion. No avascular necrosis. Articular cartilage and labrum Articular cartilage: Redemonstration of asymmetric osteoarthritis of the right hip more than the left. Femoral head osteophytes. No focal cartilage defect or subchondral edema. Labrum:  Limited detail.  No gross labral pathology identified. Joint or  bursal effusion Joint effusion: Small to moderate size effusion on the right, probably minimally larger than on the previous study. Normal joint fluid on the left. Bursae: Negative Muscles and tendons Muscles and tendons:  Negative Other findings Miscellaneous: Right-sided abdominal hernia as demonstrated previously, containing intestine and fat. Redemonstration of fluid within the endometrial canal. IMPRESSION: As seen last year, there is asymmetric osteoarthritis of the right hip with joint space narrowing, osteophytes and a moderate right hip effusion. This could be symptomatic. The effusion may be slightly larger than was noted last year. No evidence of fracture or new pathologic process. Redemonstration of a right abdominal wall hernia and fluid within the endometrial canal, both similar to the study of last year. Electronically Signed   By: Nelson Chimes M.D.   On: 08/03/2018 20:41     Positive ROS:  All other systems have been reviewed and were otherwise negative with the exception of those mentioned in the HPI and as above.  Physical Exam: BP (!) 118/94 (BP Location: Left Arm)   Pulse 84   Temp 97.6 F (36.4 C) (Oral)   Resp 16   Ht 5\' 5"  (1.651 m)   Wt 77.1 kg   SpO2 98%   BMI 28.29 kg/m  General:  Alert, no acute distress Psychiatric:  Patient is competent for consent with normal mood and affect   Cardiovascular:  No pedal edema, regular rate and rhythm Respiratory:  No wheezing, non-labored breathing GI:  Abdomen is soft and non-tender Skin:  No lesions in the area of chief complaint, no erythema Neurologic:  Sensation intact distally, CN grossly intact Lymphatic:  No axillary or cervical lymphadenopathy  Orthopedic Exam:  RLE: 5/5 DF/PF/EHL SILT s/s/t/sp/dp distr Foot wwp Able to flex hip to 80 degrees, 5IR, 20 ER pain-free Foot resting in a neutral position without hip flexion or ER  Imaging:  As above: severe degenerative changes of the R hip. Joint effusion present on MRI. No significant other signs of infection present on MRI.  Assessment/Plan: Angela Leonard is a 84 y.o. female with a R hip osteoarthritis. Given her clinical exam, suspicion for septic arthritis of the hip is low.    1.  Would recommend further continued conservative with NSAIDs such as IV Toradol vs PO NSAIDs and Tylenol 1000 mg every 8 hours scheduled.  2.  PT/OT for gait/mobilization  3. Defer remainder of care to Hospitalist team.  4.  If she is still symptomatic with R hip pain after discharge, recommend follow up with [facility]. Please page with any questions.    Leim Fabry   08/05/2018 5:14 PM

## 2018-08-05 NOTE — TOC Progression Note (Signed)
Transition of Care [facility]) - Progression Note    Patient Details  Name: Angela Leonard MRN: [id number] Date of Birth: Sep 08, 1934  [facility] CM/SW Contact  Su Hilt, RN Phone Number: 08/05/2018, 3:54 PM  Clinical Narrative:       Expected Discharge Plan: [place]    Expected Discharge Plan and Services Expected Discharge Plan: [place] Discharge Planning Services: CM Consult Post Acute Care Choice: [place] arrangements for the past 2 months: Single Family Home                     HH Arranged: PT [place] Agency: [place] ([facility])   Social Determinants of Health (SDOH) Interventions    Readmission Risk Interventions 30 Day Unplanned Readmission Risk Score     ED to Hosp-Admission (Current) from 08/03/2018 in [place] (1A)  30 Day Unplanned Readmission Risk Score (%)  9 Filed at 08/05/2018 1200     This score is the patient's risk of an unplanned readmission within 30 days of being discharged (0 -100%). The score is based on dignosis, age, lab data, medications, orders, and past utilization.   Low:  0-14.9   Medium: 15-21.9   High: 22-29.9   Extreme: 30 and above       No flowsheet data found.

## 2018-08-05 NOTE — Plan of Care (Signed)
Pt in bed with no c/o pain at this time. Alert to name and situation. Pt pleasant.

## 2018-08-05 NOTE — Clinical Social Work Note (Signed)
CSW received referral for SNF.  Case discussed with case manager and plan is to discharge home with home health.  CSW to sign off please re-consult if social work needs arise.  Chantry Headen R. Analissa Bayless, MSW, LCSW [phone]  

## 2018-08-05 NOTE — TOC Initial Note (Signed)
Transition of Care [facility]) - Initial/Assessment Note    Patient Details  Name: Angela Leonard MRN: [id number] Date of Birth: 04-Sep-1934  Transition of Care [facility]) CM/SW Contact:    Angela Garfinkel, RN Phone Number: 08/05/2018, 10:57 AM  Clinical Narrative:                 RNCM met with patient regarding transition of care. She would like to return to home with her daughter Angela Leonard. Patient has declined skilled nursing rehab. Patient gave [facility] permission to speak with daughter Angela Leonard. Per Angela Leonard [phone], patient independent since last Friday and Angela Leonard is unsure what cause this decline in mobility. Patient has not been able to walk since Saturday. She has a front-wheeled walker available to use at home.  PCP is Dr. Loney Leonard- routine appointment in July. (Daughter Angela Leonard just went through hip surgery and walker dependent so she hasn't been able to drive yet.  Angela Leonard [facility]). She states she has a friend hired that can provide transportation but not available today. Pharmacy is [facility] [address] No problems obtaining medications.  Angela Leonard shared that patient was supposed to get an infusion with Dr. Barb Leonard Rheumatogist but patient never went. MD updated.   Expected Discharge Plan: [place]     Patient Goals and CMS Choice Patient states their goals for this hospitalization and ongoing recovery are:: "I'd like to go home" CMS Medicare.gov Compare Post Acute Care list provided to:: Patient(and daughter Angela Leonard) Choice offered to / list presented to : Patient, Adult Children(Angela Leonard)  Expected Discharge Plan and Services Expected Discharge Plan: [place]   Post Acute Care Choice: Home Health(PT/SW/NA) Living arrangements for the past 2 months: Single Family Home                          Prior Living Arrangements/Services Living arrangements for the past 2 months: Single Family  Home Lives with:: Adult Children(Angela Leonard)   Do you feel safe going back to the place where you live?: Yes      Need for Family Participation in Patient Care: Yes (Comment) Care giver support system in place?: Yes (comment)   Criminal Activity/Legal Involvement Pertinent to Current Situation/Hospitalization: No - Comment as needed  Activities of Daily Living Home Assistive Devices/Equipment: None ADL Screening (condition at time of admission) Patient's cognitive ability adequate to safely complete daily activities?: Yes Is the patient deaf or have difficulty hearing?: Yes Does the patient have difficulty seeing, even when wearing glasses/contacts?: No Does the patient have difficulty concentrating, remembering, or making decisions?: No Patient able to express need for assistance with ADLs?: Yes Does the patient have difficulty dressing or bathing?: Yes Independently performs ADLs?: Yes (appropriate for developmental age) Does the patient have difficulty walking or climbing stairs?: Yes Weakness of Legs: Right Weakness of Arms/Hands: None  Permission Sought/Granted Permission sought to share information with : Case Manager, Family Supports(daughter Angela Leonard) Permission granted to share information with : Yes, Verbal Permission Granted  Share Information with NAME: Angela Leonard           Emotional Assessment       Orientation: : Oriented to Self, Oriented to Place, Oriented to Situation      Admission diagnosis:  Atypical pneumonia [J18.9] Right hip pain [M25.551] Patient Active Problem List   Diagnosis Date Noted  . Sepsis ([place]) 08/03/2018  . Atypical pneumonia 08/03/2018  .  Arthritis of right hip 08/03/2018  . HTN (hypertension) 08/03/2018  . HLD (hyperlipidemia) 08/03/2018  . AKI (acute kidney injury) ([place]) 09/15/2017  . Symptomatic cholelithiasis 10/13/2014   PCP:  Angela Late, MD Pharmacy:  No Pharmacies Listed    Social Determinants of Health  (SDOH) Interventions    Readmission Risk Interventions 30 Day Unplanned Readmission Risk Score     ED to Hosp-Admission (Current) from 08/03/2018 in [place] (1A)  30 Day Unplanned Readmission Risk Score (%)  9 Filed at 08/05/2018 0801     This score is the patient's risk of an unplanned readmission within 30 days of being discharged (0 -100%). The score is based on dignosis, age, lab data, medications, orders, and past utilization.   Low:  0-14.9   Medium: 15-21.9   High: 22-29.9   Extreme: 30 and above       No flowsheet data found.

## 2018-08-05 NOTE — Progress Notes (Signed)
[place] [facility] [place] NAME: Angela Leonard    MR#:  [id number]  DATE OF BIRTH:  08/29/1934  SUBJECTIVE:  CHIEF COMPLAINT:   Chief Complaint  Patient presents with  . Hip Pain  . Back Pain   Continues to have severe pain in right hip. Unable to ambulate Afebrile. REVIEW OF SYSTEMS:    Review of Systems  Constitutional: Positive for malaise/fatigue. Negative for chills and fever.  HENT: Negative for sore throat.   Eyes: Negative for blurred vision, double vision and pain.  Respiratory: Positive for cough. Negative for hemoptysis, shortness of breath and wheezing.   Cardiovascular: Negative for chest pain, palpitations, orthopnea and leg swelling.  Gastrointestinal: Negative for abdominal pain, constipation, diarrhea, heartburn, nausea and vomiting.  Genitourinary: Negative for dysuria and hematuria.  Musculoskeletal: Positive for back pain and joint pain.  Skin: Negative for rash.  Neurological: Negative for sensory change, speech change, focal weakness and headaches.  Endo/Heme/Allergies: Does not bruise/bleed easily.  Psychiatric/Behavioral: Negative for depression. The patient is not nervous/anxious.     DRUG ALLERGIES:  No Known Allergies  VITALS:  Blood pressure 124/66, pulse 76, temperature 97.7 F (36.5 C), temperature source Oral, resp. rate 16, height 5\' 5"  (1.651 m), weight 77.1 kg, SpO2 96 %.  PHYSICAL EXAMINATION:   Physical Exam  GENERAL:  84 y.o.-year-old patient lying in the bed with no acute distress.  EYES: Pupils equal, round, reactive to light and accommodation. No scleral icterus. Extraocular muscles intact.  HEENT: Head atraumatic, normocephalic. Oropharynx and nasopharynx clear.  NECK:  Supple, no jugular venous distention. No thyroid enlargement, no tenderness.  LUNGS: Normal breath sounds bilaterally, no wheezing, rales, rhonchi. No use of accessory muscles of respiration.  CARDIOVASCULAR: S1, S2 normal. No  murmurs, rubs, or gallops.  ABDOMEN: Soft, nontender, nondistended. Bowel sounds present. No organomegaly or mass.  EXTREMITIES: No cyanosis, clubbing or edema b/l.    NEUROLOGIC: Cranial nerves II through XII are intact.  Limited assessment of right lower extremity due to severe pain in right hip and thigh. PSYCHIATRIC: The patient is alert and oriented x 3.  SKIN: No obvious rash, lesion, or ulcer.   LABORATORY PANEL:   CBC Recent Labs  Lab 08/04/18 0521  WBC 14.1*  HGB 13.4  HCT 41.7  PLT 212   ------------------------------------------------------------------------------------------------------------------ Chemistries  Recent Labs  Lab 08/03/18 1426 08/04/18 0521  NA 135 138  K 4.1 4.2  CL 102 106  CO2 19* 25  GLUCOSE 141* 96  BUN 29* 22  CREATININE 1.04* 0.97  CALCIUM 9.2 8.6*  AST 31  --   ALT 24  --   ALKPHOS 53  --   BILITOT 0.8  --    ------------------------------------------------------------------------------------------------------------------  Cardiac Enzymes Recent Labs  Lab 08/03/18 1426  TROPONINI <0.03   ------------------------------------------------------------------------------------------------------------------  RADIOLOGY:  Dg Lumbar Spine 2-3 Views  Result Date: 08/03/2018 CLINICAL DATA:  Low back and right hip pain EXAM: LUMBAR SPINE - 2-3 VIEW COMPARISON:  MRI 09/15/2017 FINDINGS: Grade 1 L4-5 anterolisthesis. Multilevel mild facet hypertrophy. Vertebral body heights are maintained. Mild L5-S1 disc space narrowing. Calcification of the abdominal aorta. IMPRESSION: Grade 1 degenerative anterolisthesis at L4-5.  No acute abnormality. Electronically Signed   By: Ulyses Jarred M.D.   On: 08/03/2018 15:44   Mr Lumbar Spine W Wo Contrast  Result Date: 08/03/2018 CLINICAL DATA:  Prior lumbar surgery. Low back and right hip pain over the last 2 days. EXAM: MRI LUMBAR SPINE WITHOUT  AND WITH CONTRAST TECHNIQUE: Multiplanar and multiecho pulse  sequences of the lumbar spine were obtained without and with intravenous contrast. CONTRAST:  7 cc Gadavist COMPARISON:  None. FINDINGS: Segmentation:  5 lumbar type vertebral bodies. Alignment:  6 mm anterolisthesis L4-5.  2 mm anterolisthesis L5-S1. Vertebrae:  No fracture or primary bone lesion. Conus medullaris and cauda equina: Conus extends to the L1 level. Conus and cauda equina appear normal. Paraspinal and other soft tissues: Negative Disc levels: T11-12 and T12-L1: Normal. L1-2: Central disc herniation indents the thecal sac but does not cause visible neural compression. L2-3: Mild bulging of the disc. Mild ligamentous prominence. No compressive stenosis. L3-4: Bulging of the disc more prominent towards the left. Mild facet and ligamentous prominence. Mild narrowing of the left lateral recess but without visible neural compression. Bilateral foraminal narrowing with some potential to cause irritation of either L3 nerve. L4-5: Previous left hemilaminectomy. Bilateral facet arthropathy allowing 6 mm of anterolisthesis. Shallow protrusion of the disc. Left foraminal stenosis due to encroachment by osteophyte in disc material could compress the left L4 nerve. L5-S1: Facet osteoarthritis with 2 mm of anterolisthesis. Shallow protrusion of the disc. No compressive canal or foraminal stenosis. IMPRESSION: L1-2: Central disc herniation indents the thecal sac but does not affect the neural structures. L3-4: Bulging of the disc more prominent towards the left. Bilateral facet osteoarthritis. Left lateral recess narrowing that could possibly affect the left L4 nerve. Bilateral foraminal encroachment by osteophytes and disc material would have some potential to irritate either L3 nerve, though definite compression is not established. L4-5: Previous left hemilaminectomy. 6 mm anterolisthesis. Shallow protrusion of the disc. Left foraminal stenosis that could affect the exiting left L4 nerve. L5-S1: 2 mm degenerative  anterolisthesis. Bulging of the disc. No apparent compressive stenosis. Electronically Signed   By: Nelson Chimes M.D.   On: 08/03/2018 20:35   Mr Hip Right W Wo Contrast  Result Date: 08/03/2018 CLINICAL DATA:  Right hip pain over the last 2 days. EXAM: MRI OF THE RIGHT HIP WITHOUT AND WITH CONTRAST TECHNIQUE: Multiplanar, multisequence MR imaging was performed both before and after administration of intravenous contrast. CONTRAST:  7 cc Gadavist COMPARISON:  Radiography same day.  MRI 09/15/2017. FINDINGS: Bones: Very similar appearance to the previous study. No fracture or primary bone lesion. No avascular necrosis. Articular cartilage and labrum Articular cartilage: Redemonstration of asymmetric osteoarthritis of the right hip more than the left. Femoral head osteophytes. No focal cartilage defect or subchondral edema. Labrum:  Limited detail.  No gross labral pathology identified. Joint or bursal effusion Joint effusion: Small to moderate size effusion on the right, probably minimally larger than on the previous study. Normal joint fluid on the left. Bursae: Negative Muscles and tendons Muscles and tendons:  Negative Other findings Miscellaneous: Right-sided abdominal hernia as demonstrated previously, containing intestine and fat. Redemonstration of fluid within the endometrial canal. IMPRESSION: As seen last year, there is asymmetric osteoarthritis of the right hip with joint space narrowing, osteophytes and a moderate right hip effusion. This could be symptomatic. The effusion may be slightly larger than was noted last year. No evidence of fracture or new pathologic process. Redemonstration of a right abdominal wall hernia and fluid within the endometrial canal, both similar to the study of last year. Electronically Signed   By: Nelson Chimes M.D.   On: 08/03/2018 20:41   Dg Chest Portable 1 View  Result Date: 08/03/2018 CLINICAL DATA:  Tachycardia EXAM: PORTABLE CHEST 1 VIEW COMPARISON:  None.  FINDINGS:  Diffuse interstitial coarsening. Mild cardiomegaly. No focal airspace consolidation. No pleural effusion or pneumothorax. IMPRESSION: Bronchitic type diffuse interstitial opacities. Electronically Signed   By: Ulyses Jarred M.D.   On: 08/03/2018 17:03   Dg Hip Unilat W Or Wo Pelvis 2-3 Views Right  Result Date: 08/03/2018 CLINICAL DATA:  84 year old female with back and right hip pain beginning 2 days ago. No known injury. EXAM: DG HIP (WITH OR WITHOUT PELVIS) 2-3V RIGHT COMPARISON:  09/14/2017. FINDINGS: Femoral heads remain normally located. Asymmetric right hip joint space loss, subchondral sclerosis and spurring redemonstrated. The pelvis appears stable and intact. Aortoiliac calcified atherosclerosis. Stable bowel gas pattern. Proximal right femur appears stable and intact. Bilateral lower extremity calcified peripheral vascular disease. IMPRESSION: 1. No acute osseous abnormality identified about the right hip or pelvis. 2. Moderate asymmetric right hip osteoarthritis. Electronically Signed   By: Genevie Ann M.D.   On: 08/03/2018 15:42   ASSESSMENT AND PLAN:    * Sepsis ([place]) -antibiotics given, sepsis would be due to atypical pneumonia, lactic acid within normal limits, blood pressure stable, culture sent   * Arthritis of right hip -PRN analgesia, continue home dose methotrexate, PT OT consult MRI shows fluid..  Added IV Toradol. Orthopedic consulted for possible septic arthritis. I discussed with her rheumatologist from [facility] was concerned about infection.   * HTN (hypertension) -continue home meds  * HLD (hyperlipidemia) -home dose antilipid   All the records are reviewed and case discussed with Care Management/Social Worker Management plans discussed with the patient, family and they are in agreement.  CODE STATUS: FULL CODE  DVT Prophylaxis: SCDs  TOTAL TIME TAKING CARE OF THIS PATIENT: 35 minutes.   POSSIBLE D/C IN 1-2 DAYS, DEPENDING ON CLINICAL CONDITION.  Neita Carp M.D on 08/05/2018 at 12:48 PM  Between 7am to 6pm - Pager - [phone]  After 6pm go to [email or web address] - password [email or web address] [facility]  Office  [phone]  CC: Primary care physician; Derinda Late, MD  Note: This dictation was prepared with Dragon dictation along with smaller phrase technology. Any transcriptional errors that result from this process are unintentional.

## 2018-08-05 NOTE — Progress Notes (Signed)
Pt woke up with 10/10 pain. Pt given pain med as ordered and repositioned. Pt is now sleeping. Will continue to monitor for discomfort. Pdowless, RN

## 2018-08-05 NOTE — Progress Notes (Signed)
Nutrition Brief Note  Patient identified on the Malnutrition Screening Tool (MST) Report  Wt Readings from Last 15 Encounters:  08/03/18 77.1 kg  09/17/17 87.1 kg  10/13/14 78.5 kg  10/13/14 69.40 kg   84 year old female with PMHx of HTN, HLD admitted with sepsis from atypical PNA, arthritis of right hip.  Met with patient at bedside. She believes the MST must have been filled out in error. She reports a good appetite and intake that is unchanged from baseline. She reports she is eating at least 75% of her meals here (documented as 85% in chart). PTA she was eating 3 meals per day and reports eating a serving of protein at each meal. She reports she is weight stable at 170-173 lbs. Some mild age-related muscle loss found on NFPE. Patient does not meet criteria for malnutrition. She does not drink any ONS at home. Skin is intact. No nutrition questions or concerns at this time.  Nutrition-Focused Physical Exam:   Most Recent Value  Orbital Region  No depletion  Upper Arm Region  Mild depletion  Thoracic and Lumbar Region  No depletion  Buccal Region  No depletion  Temple Region  Mild depletion  Clavicle Bone Region  No depletion  Clavicle and Acromion Bone Region  Mild depletion  Scapular Bone Region  No depletion  Dorsal Hand  Mild depletion  Patellar Region  No depletion  Anterior Thigh Region  No depletion  Posterior Calf Region  No depletion  Edema (RD Assessment)  None     Body mass index is 28.29 kg/m. Patient meets criteria for overweight based on current BMI.   Current diet order is heart healthy, patient is consuming approximately 85% of meals at this time. Labs and medications reviewed.   No nutrition interventions warranted at this time. If nutrition issues arise, please consult RD.   Willey Blade, MS, [place], LDN Office: [phone] Pager: [phone] After Hours/Weekend Pager: [phone]

## 2018-08-06 LAB — CBC WITH DIFFERENTIAL/PLATELET
Abs Immature Granulocytes: 0.07 10*3/uL (ref 0.00–0.07)
Basophils Absolute: 0 10*3/uL (ref 0.0–0.1)
Basophils Relative: 0 %
Eosinophils Absolute: 0.3 10*3/uL (ref 0.0–0.5)
Eosinophils Relative: 3 %
HCT: 42.7 % (ref 36.0–46.0)
Hemoglobin: 13.7 g/dL (ref 12.0–15.0)
IMMATURE GRANULOCYTES: 1 %
Lymphocytes Relative: 12 %
Lymphs Abs: 1.3 10*3/uL (ref 0.7–4.0)
MCH: 33.1 pg (ref 26.0–34.0)
MCHC: 32.1 g/dL (ref 30.0–36.0)
MCV: 103.1 fL — ABNORMAL HIGH (ref 80.0–100.0)
MONOS PCT: 3 %
Monocytes Absolute: 0.3 10*3/uL (ref 0.1–1.0)
NEUTROS PCT: 81 %
Neutro Abs: 8.5 10*3/uL — ABNORMAL HIGH (ref 1.7–7.7)
Platelets: 239 10*3/uL (ref 150–400)
RBC: 4.14 MIL/uL (ref 3.87–5.11)
RDW: 14.5 % (ref 11.5–15.5)
WBC: 10.5 10*3/uL (ref 4.0–10.5)
nRBC: 0 % (ref 0.0–0.2)

## 2018-08-06 MED ORDER — OXYCODONE HCL 5 MG PO TABS: 5.0000 mg | ORAL_TABLET | Freq: Four times a day (QID) | ORAL | 0 refills | Status: DC | PRN

## 2018-08-06 MED ORDER — IBUPROFEN 400 MG PO TABS: 400.0000 mg | ORAL_TABLET | Freq: Three times a day (TID) | ORAL | 0 refills | Status: AC

## 2018-08-06 NOTE — Progress Notes (Signed)
Physical Therapy Treatment Patient Details Name: Angela Leonard MRN: [id number] DOB: 1934/10/13 Today's Date: 08/06/2018    History of Present Illness Pt is an 84 y.o. female presenting to hospital with R hip pain, back pain, difficulty ambulating, and noted with tachycardia.  Pt admitted with sepsis, atypical PNA, and R hip arthritis (imaging negative for R hip fx).  PMH includes htn, HLD, back surgery, and colon surgery.  Of note, prior PT note in 08/2017 noting hospital admit for worsening R hip pain (pt reports pain comes and goes but is currently worse than it usually gets).    PT Comments    Pt in bed upon arrival with call [place] alerting staff that she was attempting to get out of bed.  RN first to respond and I took over care.  To edge of bed with min a x 1.  Once sitting generally steady but supervision for safety due to confusion.  She stated hip pain was better today.  She was able to stand and transfer to commode at bedside with walker and min assist.  Some safety deficits and difficulty following direction but overall improvement in mobility.  After assist for self care, voided only, she was able to ambulate 5' to recliner.  After seated rest, she was able to stand and participate in limited exercises before needing to sit due to fatigue.    Pt with confusion thinking it was 9:00 at night.  She stated this several times and asked me when I was going home because I must be tired after working all day.  Oriented to time and opened blinds.  "I thought you were trying to trick me."  She also is not aware of her physical limitations at this time thinking she could walk with ease to the bathroom and was surprised I brought the commode to her  After walking/turning 5' to commode she was fatigued and needed to sit.  She also did not seem aware that her R hip had hurt on previous attempts limiting her mobility.  Unsure if this is her baseline.  Per chart review, SWS/discharge planning notes indicate  discharge to home is planned.  Noted she was independent upon admission with occasional use of cane.  Pt will need increased assist at home and 24 hour assist for safety due to cognition and limited mobility which is not near her baseline.  Chart review indicates she has a FWW at home but a RW would be better for pt at this time.  Will need +1 assist at all times for mobility upon discharge and possibly a wheelchair to allow for access in and out of home along with household mobility as she is unable to ambulate household distances at this time.   Patient suffers from R  Hip pain/arthritic changes which impairs his/her ability to perform daily activities like toileting, dressing, grooming, bathing in the home. A walker will not fully resolve the patient's issue with performing activities of daily living as functional mobility is limited at this time. A wheelchair is required/recommended and will allow patient to safely perform daily activities.   Patient can safely propel the wheelchair in the home or has a caregiver who can provide assistance.     Follow Up Recommendations  SNF     Equipment Recommendations  Rolling walker with 5" wheels;3in1 (PT) , wheelchair    Recommendations for Other Services       Precautions / Restrictions Precautions Precautions: Fall Restrictions Weight Bearing Restrictions: No  Mobility  Bed Mobility Overal bed mobility: Needs Assistance Bed Mobility: Supine to Sit     Supine to sit: Min assist;Mod assist     General bed mobility comments: pt trying to get out of bed on her own upon arrival to use the bathroom  Transfers Overall transfer level: Needs assistance Equipment used: Rolling walker (2 wheeled) Transfers: Sit to/from Stand Sit to Stand: Min assist            Ambulation/Gait Ambulation/Gait assistance: Min Web designer (Feet): 5 Feet Assistive device: Rolling walker (2 wheeled) Gait Pattern/deviations: Step-through  pattern;Decreased step length - right;Decreased step length - left Gait velocity: decreased       Stairs             Wheelchair Mobility    Modified Rankin (Stroke Patients Only)       Balance Overall balance assessment: Needs assistance Sitting-balance support: No upper extremity supported;Feet supported Sitting balance-[name] Scale: Fair     Standing balance support: Bilateral upper extremity supported Standing balance-[name] Scale: Poor Standing balance comment: pt requiring B UE support on RW for static standing balance                            Cognition Arousal/Alertness: Awake/alert Behavior During Therapy: WFL for tasks assessed/performed Overall Cognitive Status: No family/caregiver present to determine baseline cognitive functioning                                 General Comments: confusion remains, thought it wa nightime, telling me i must have had a long day since it was time to go home.  Had a hard time believing that it was 9:00 in the morning.      Exercises Other Exercises Other Exercises: to commode to void Other Exercises: standing marching in place x 10 and SLR x 5 with walker and min assist     General Comments        Pertinent Vitals/Pain Pain Assessment: Faces Faces Pain Scale: Hurts little more Pain Location: with mobility R hip Pain Descriptors / Indicators: Aching;Sore Pain Intervention(s): Limited activity within patient's tolerance;Monitored during session    Home Living                      Prior Function            PT Goals (current goals can now be found in the care plan section) Progress towards PT goals: Progressing toward goals    Frequency    Min 2X/week      PT Plan Current plan remains appropriate    Co-evaluation              AM-PAC PT "6 Clicks" Mobility   Outcome Measure  Help needed turning from your back to your side while in a flat bed without using  bedrails?: A Lot Help needed moving from lying on your back to sitting on the side of a flat bed without using bedrails?: A Lot Help needed moving to and from a bed to a chair (including a wheelchair)?: A Little Help needed standing up from a chair using your arms (e.g., wheelchair or bedside chair)?: A Little Help needed to walk in hospital room?: A Lot Help needed climbing 3-5 steps with a railing? : Total 6 Click Score: 13    End of Session Equipment Utilized During Treatment:  Gait belt Activity Tolerance: Patient limited by pain;Patient limited by fatigue Patient left: in chair;with call Inscore/phone within reach;with chair alarm set Nurse Communication: Mobility status Pain - Right/Left: Right Pain - part of body: Hip     Time: 1610-9604 PT Time Calculation (min) (ACUTE ONLY): 13 min  Charges:  $Gait Training: 8-22 mins                     Chesley Noon, PTA 08/06/18, 10:53 AM

## 2018-08-06 NOTE — Progress Notes (Signed)
Discharge summary reviewed with Pt and daughter. Answered all questions. Belongings packed and given upon discharge. Rxs sent to [facility]

## 2018-08-06 NOTE — Discharge Instructions (Signed)
Resume diet and activity as before ° ° °

## 2018-08-06 NOTE — Care Management Important Message (Signed)
Important Message  Patient Details  Name: Angela Leonard MRN: [id number] Date of Birth: Jun 26, 1934   Medicare Important Message Given:  Yes    [name] 08/06/2018, 11:30 AM

## 2018-08-08 LAB — CULTURE, BLOOD (ROUTINE X 2)
Culture: NO GROWTH
Culture: NO GROWTH
SPECIAL REQUESTS: ADEQUATE
Special Requests: ADEQUATE

## 2018-08-18 NOTE — Discharge Summary (Signed)
[facility] NAME: Angela Leonard    MR#:  [id number]  DATE OF BIRTH:  [birth year]  DATE OF ADMISSION:  08/03/2018 ADMITTING PHYSICIAN: Lance Coon, MD  DATE OF DISCHARGE: 08/06/2018  2:55 PM  PRIMARY CARE PHYSICIAN: Derinda Late, MD   ADMISSION DIAGNOSIS:  Atypical pneumonia [J18.9] Right hip pain [M25.551]  DISCHARGE DIAGNOSIS:  Principal Problem:   Sepsis ([place]) Active Problems:   Atypical pneumonia   Arthritis of right hip   HTN (hypertension)   HLD (hyperlipidemia)   SECONDARY DIAGNOSIS:   Past Medical History:  Diagnosis Date  . HLD (hyperlipidemia)   . Hypertension      ADMITTING HISTORY  HISTORY OF PRESENT ILLNESS:  Angela Leonard  is a 84 y.o. female who presents with chief complaint as above.  Patient presented to the ED for complaint of right hip pain and back pain with difficulty ambulating.  Here in the ED work-up was suspicious for the possibility of infection given leukocytosis and tachycardia.  She had a similar presentation sometime ago with septic arthritis.  Imaging and work-up here shows that she does not have septic joint.  However, chest x-ray imaging is concerning for the possibility of atypical pneumonia, with interstitial markings.  Hospitalist were called for admission   HOSPITAL COURSE:   *Sepsis suspected secondary to leukocytosis and tachycardia.  Mild elevated lactic acid.  Likely due to dehydration and pain. Cultures negative.  Afebrile.  Initially atypical pneumonia suspected but she did not have any respiratory symptoms.  Antibiotics started and later stopped.  *Arthritis of right hip -PRN analgesia, continue home dose methotrexate, PT OT consult MRI shows fluid..  Added IV Toradol. Orthopedic consulted for possible septic arthritis.  Did not feel like this was septic arthritis.  Likely due to osteoarthritis. I discussed with her rheumatologist from [facility]  Patient was  started on NSAIDs and is much improved.  *HTN (hypertension) -continue home meds *HLD (hyperlipidemia) -home dose antilipid  Patient worked with physical therapy.  Needs further physical therapy at skilled nursing facility where she was discharged to.  Stable.  CONSULTS OBTAINED:  Treatment Team:  Leim Fabry, MD  DRUG ALLERGIES:  No Known Allergies  DISCHARGE MEDICATIONS:   Allergies as of 08/06/2018   No Known Allergies     Medication List    TAKE these medications   aspirin EC 81 MG tablet Take 81 mg by mouth at bedtime.   Cartia XT 120 MG 24 hr capsule Generic drug:  diltiazem Take 120 mg by mouth daily.   cholecalciferol 1000 units tablet Commonly known as:  VITAMIN D Take 1,000 Units by mouth daily.   Eliquis 5 MG Tabs tablet Generic drug:  apixaban Take 5 mg by mouth every 12 (twelve) hours.   folic acid 1 MG tablet Commonly known as:  FOLVITE Take 1 mg by mouth daily.   methotrexate 2.5 MG tablet Commonly known as:  RHEUMATREX Take 6 tablets by mouth every Tuesday.   multivitamin with minerals Tabs tablet Take 1 tablet by mouth daily.   oxyCODONE 5 MG immediate release tablet Commonly known as:  Oxy IR/ROXICODONE Take 1 tablet (5 mg total) by mouth every 6 (six) hours as needed for severe pain.   ramipril 10 MG capsule Commonly known as:  ALTACE Take 10 mg by mouth daily.   simvastatin 20 MG tablet Commonly known as:  ZOCOR Take 20 mg by mouth at bedtime.   vitamin B-12 1000 MCG tablet  Commonly known as:  CYANOCOBALAMIN Take 1,000 mcg by mouth daily.     ASK your doctor about these medications   ibuprofen 400 MG tablet Commonly known as:  Advil Take 1 tablet (400 mg total) by mouth 3 (three) times daily for 3 days. Ask about: Should I take this medication?       Today   VITAL SIGNS:  Blood pressure 111/77, pulse 88, temperature 98 F (36.7 C), temperature source Oral, resp. rate 18, height 5\' 5"  (1.651 m), weight 77.1 kg,  SpO2 97 %.  I/O:  No intake or output data in the 24 hours ending 08/18/18 1520  PHYSICAL EXAMINATION:  Physical Exam  GENERAL:  83 y.o.-year-old patient lying in the bed with no acute distress.  LUNGS: Normal breath sounds bilaterally, no wheezing, rales,rhonchi or crepitation. No use of accessory muscles of respiration.  CARDIOVASCULAR: S1, S2 normal. No murmurs, rubs, or gallops.  ABDOMEN: Soft, non-tender, non-distended. Bowel sounds present. No organomegaly or mass.  NEUROLOGIC: Moves all 4 extremities. PSYCHIATRIC: The patient is alert and oriented x 3.  SKIN: No obvious rash, lesion, or ulcer.   DATA REVIEW:   CBC No results for input(s): WBC, HGB, HCT, PLT in the last 168 hours.  Chemistries  No results for input(s): NA, K, CL, CO2, GLUCOSE, BUN, CREATININE, CALCIUM, MG, AST, ALT, ALKPHOS, BILITOT in the last 168 hours.  Invalid input(s): GFRCGP  Cardiac Enzymes No results for input(s): TROPONINI in the last 168 hours.  Microbiology Results  Results for orders placed or performed during the hospital encounter of 08/03/18  Blood culture (routine x 2)     Status: None   Collection Time: 08/03/18  4:15 PM  Result Value Ref Range Status   Specimen Description BLOOD LEFT ANTECUBITAL  Final   Special Requests   Final    BOTTLES DRAWN AEROBIC AND ANAEROBIC Blood Culture adequate volume   Culture   Final    NO GROWTH 5 DAYS Performed at [facility], [address], [place], [place] [address]    Report Status 08/08/2018 FINAL  Final  Blood culture (routine x 2)     Status: None   Collection Time: 08/03/18  4:15 PM  Result Value Ref Range Status   Specimen Description BLOOD RIGHT ANTECUBITAL  Final   Special Requests   Final    BOTTLES DRAWN AEROBIC AND ANAEROBIC Blood Culture adequate volume   Culture   Final    NO GROWTH 5 DAYS Performed at [facility], [address], [place], [place] [address]    Report Status 08/08/2018 FINAL  Final     RADIOLOGY:  No results found.  Follow up with PCP in 1 week.  Management plans discussed with the patient, family and they are in agreement.  CODE STATUS:  Code Status History    Date Active Date Inactive Code Status Order ID Comments User Context   08/04/2018 0031 08/06/2018 1859 Full Code [id number]  Lance Coon, MD Inpatient   09/15/2017 0620 09/17/2017 1431 Full Code [id number]  Harrie Foreman, MD Inpatient   10/13/2014 0556 10/15/2014 2057 Full Code [id number]  Molly Maduro, MD ED      TOTAL TIME TAKING CARE OF THIS PATIENT ON DAY OF DISCHARGE: more than 30 minutes.   Leia Alf Kimmi Acocella M.D on 08/18/2018 at 3:20 PM  Between 7am to 6pm - Pager - [phone]  After 6pm go to [email or web address] - password [email or web address]  Office  [phone]  CC:  Primary care physician; Derinda Late, MD  Note: This dictation was prepared with Dragon dictation along with smaller phrase technology. Any transcriptional errors that result from this process are unintentional.

## 2019-02-19 ENCOUNTER — Emergency Department: Payer: Medicare Other

## 2019-02-19 ENCOUNTER — Other Ambulatory Visit: Payer: Self-pay

## 2019-02-19 ENCOUNTER — Inpatient Hospital Stay
Admission: EM | Admit: 2019-02-19 | Discharge: 2019-02-22 | DRG: 872 | Disposition: A | Discharge status: Home Health | Payer: Medicare Other | Attending: Internal Medicine | Admitting: Internal Medicine

## 2019-02-19 DIAGNOSIS — Y92019 Unspecified place in single-family (private) house as the place of occurrence of the external cause: Secondary | ICD-10-CM

## 2019-02-19 DIAGNOSIS — M25551 Pain in right hip: Secondary | ICD-10-CM

## 2019-02-19 DIAGNOSIS — A419 Sepsis, unspecified organism: Secondary | ICD-10-CM | POA: Diagnosis not present

## 2019-02-19 DIAGNOSIS — G8929 Other chronic pain: Secondary | ICD-10-CM | POA: Diagnosis present

## 2019-02-19 DIAGNOSIS — I4891 Unspecified atrial fibrillation: Secondary | ICD-10-CM | POA: Diagnosis present

## 2019-02-19 DIAGNOSIS — I129 Hypertensive chronic kidney disease with stage 1 through stage 4 chronic kidney disease, or unspecified chronic kidney disease: Secondary | ICD-10-CM | POA: Diagnosis present

## 2019-02-19 DIAGNOSIS — E785 Hyperlipidemia, unspecified: Secondary | ICD-10-CM | POA: Diagnosis present

## 2019-02-19 DIAGNOSIS — I482 Chronic atrial fibrillation, unspecified: Secondary | ICD-10-CM | POA: Diagnosis present

## 2019-02-19 DIAGNOSIS — R059 Cough, unspecified: Secondary | ICD-10-CM

## 2019-02-19 DIAGNOSIS — Z87891 Personal history of nicotine dependence: Secondary | ICD-10-CM

## 2019-02-19 DIAGNOSIS — M545 Low back pain: Secondary | ICD-10-CM | POA: Diagnosis present

## 2019-02-19 DIAGNOSIS — M069 Rheumatoid arthritis, unspecified: Secondary | ICD-10-CM | POA: Diagnosis present

## 2019-02-19 DIAGNOSIS — N390 Urinary tract infection, site not specified: Secondary | ICD-10-CM | POA: Diagnosis present

## 2019-02-19 DIAGNOSIS — Z820 Family history of epilepsy and other diseases of the nervous system: Secondary | ICD-10-CM

## 2019-02-19 DIAGNOSIS — Z8249 Family history of ischemic heart disease and other diseases of the circulatory system: Secondary | ICD-10-CM

## 2019-02-19 DIAGNOSIS — B961 Klebsiella pneumoniae [K. pneumoniae] as the cause of diseases classified elsewhere: Secondary | ICD-10-CM | POA: Diagnosis present

## 2019-02-19 DIAGNOSIS — N183 Chronic kidney disease, stage 3 (moderate): Secondary | ICD-10-CM | POA: Diagnosis present

## 2019-02-19 DIAGNOSIS — R05 Cough: Secondary | ICD-10-CM

## 2019-02-19 DIAGNOSIS — Z79899 Other long term (current) drug therapy: Secondary | ICD-10-CM

## 2019-02-19 DIAGNOSIS — Z7901 Long term (current) use of anticoagulants: Secondary | ICD-10-CM

## 2019-02-19 DIAGNOSIS — W19XXXA Unspecified fall, initial encounter: Secondary | ICD-10-CM | POA: Diagnosis present

## 2019-02-19 DIAGNOSIS — I4821 Permanent atrial fibrillation: Secondary | ICD-10-CM

## 2019-02-19 DIAGNOSIS — R262 Difficulty in walking, not elsewhere classified: Secondary | ICD-10-CM | POA: Diagnosis present

## 2019-02-19 DIAGNOSIS — R531 Weakness: Secondary | ICD-10-CM | POA: Diagnosis present

## 2019-02-19 DIAGNOSIS — R112 Nausea with vomiting, unspecified: Secondary | ICD-10-CM | POA: Diagnosis not present

## 2019-02-19 DIAGNOSIS — A4159 Other Gram-negative sepsis: Secondary | ICD-10-CM | POA: Diagnosis not present

## 2019-02-19 DIAGNOSIS — Z20828 Contact with and (suspected) exposure to other viral communicable diseases: Secondary | ICD-10-CM | POA: Diagnosis present

## 2019-02-19 DIAGNOSIS — Z7982 Long term (current) use of aspirin: Secondary | ICD-10-CM

## 2019-02-19 LAB — CBC WITH DIFFERENTIAL/PLATELET
Abs Immature Granulocytes: 0.07 10*3/uL (ref 0.00–0.07)
Basophils Absolute: 0 10*3/uL (ref 0.0–0.1)
Basophils Relative: 0 %
Eosinophils Absolute: 0 10*3/uL (ref 0.0–0.5)
Eosinophils Relative: 0 %
HCT: 46.7 % — ABNORMAL HIGH (ref 36.0–46.0)
Hemoglobin: 15.7 g/dL — ABNORMAL HIGH (ref 12.0–15.0)
Immature Granulocytes: 1 %
Lymphocytes Relative: 8 %
Lymphs Abs: 1 10*3/uL (ref 0.7–4.0)
MCH: 33.7 pg (ref 26.0–34.0)
MCHC: 33.6 g/dL (ref 30.0–36.0)
MCV: 100.2 fL — ABNORMAL HIGH (ref 80.0–100.0)
Monocytes Absolute: 0.3 10*3/uL (ref 0.1–1.0)
Monocytes Relative: 3 %
Neutro Abs: 11.3 10*3/uL — ABNORMAL HIGH (ref 1.7–7.7)
Neutrophils Relative %: 88 %
Platelets: 297 10*3/uL (ref 150–400)
RBC: 4.66 MIL/uL (ref 3.87–5.11)
RDW: 13.8 % (ref 11.5–15.5)
WBC: 12.8 10*3/uL — ABNORMAL HIGH (ref 4.0–10.5)
nRBC: 0 % (ref 0.0–0.2)

## 2019-02-19 LAB — COMPREHENSIVE METABOLIC PANEL
ALT: 21 U/L (ref 0–44)
AST: 33 U/L (ref 15–41)
Albumin: 3.1 g/dL — ABNORMAL LOW (ref 3.5–5.0)
Alkaline Phosphatase: 73 U/L (ref 38–126)
Anion gap: 12 (ref 5–15)
BUN: 25 mg/dL — ABNORMAL HIGH (ref 8–23)
CO2: 22 mmol/L (ref 22–32)
Calcium: 8.9 mg/dL (ref 8.9–10.3)
Chloride: 102 mmol/L (ref 98–111)
Creatinine, Ser: 1.16 mg/dL — ABNORMAL HIGH (ref 0.44–1.00)
GFR calc Af Amer: 51 mL/min — ABNORMAL LOW (ref >60)
GFR calc non Af Amer: 44 mL/min — ABNORMAL LOW (ref >60)
Glucose, Bld: 145 mg/dL — ABNORMAL HIGH (ref 70–99)
Potassium: 3.5 mmol/L (ref 3.5–5.1)
Sodium: 136 mmol/L (ref 135–145)
Total Bilirubin: 1 mg/dL (ref 0.3–1.2)
Total Protein: 7.7 g/dL (ref 6.5–8.1)

## 2019-02-19 LAB — CK: Total CK: 36 U/L — ABNORMAL LOW (ref 38–234)

## 2019-02-19 LAB — TROPONIN I (HIGH SENSITIVITY): Troponin I (High Sensitivity): 10 ng/L (ref <18)

## 2019-02-19 LAB — SARS CORONAVIRUS 2 BY RT PCR (HOSPITAL ORDER, PERFORMED IN ~~LOC~~ HOSPITAL LAB): SARS Coronavirus 2: NEGATIVE

## 2019-02-19 MED ORDER — ASPIRIN EC 81 MG PO TBEC: 81.0000 mg | DELAYED_RELEASE_TABLET | Freq: Every day | ORAL | Status: DC

## 2019-02-19 MED ORDER — DILTIAZEM HCL 100 MG IV SOLR
5.0000 mg/h | INTRAVENOUS | Status: DC
  Filled 2019-02-19 (×2): qty 100

## 2019-02-19 MED ORDER — RAMIPRIL 10 MG PO CAPS
10.0000 mg | ORAL_CAPSULE | Freq: Every day | ORAL | Status: DC
  Administered 2019-02-19 – 2019-02-22 (×3): 10 mg via ORAL
  Filled 2019-02-19 (×5): qty 1

## 2019-02-19 MED ORDER — TRAZODONE HCL 50 MG PO TABS
25.0000 mg | ORAL_TABLET | Freq: Every evening | ORAL | Status: DC | PRN
  Administered 2019-02-19: 25 mg via ORAL
  Filled 2019-02-19: qty 1

## 2019-02-19 MED ORDER — ACETAMINOPHEN 325 MG PO TABS
650.0000 mg | ORAL_TABLET | Freq: Four times a day (QID) | ORAL | Status: DC | PRN
  Administered 2019-02-19 – 2019-02-21 (×2): 650 mg via ORAL
  Filled 2019-02-19 (×2): qty 2

## 2019-02-19 MED ORDER — VITAMIN D3 25 MCG (1000 UNIT) PO TABS
1000.0000 [IU] | ORAL_TABLET | Freq: Every day | ORAL | Status: DC
  Administered 2019-02-19 – 2019-02-22 (×4): 1000 [IU] via ORAL
  Filled 2019-02-19 (×8): qty 1

## 2019-02-19 MED ORDER — ONDANSETRON HCL 4 MG PO TABS: 4.0000 mg | ORAL_TABLET | Freq: Four times a day (QID) | ORAL | Status: DC | PRN

## 2019-02-19 MED ORDER — DILTIAZEM HCL ER COATED BEADS 120 MG PO CP24
120.0000 mg | ORAL_CAPSULE | Freq: Every day | ORAL | Status: DC
  Administered 2019-02-19 – 2019-02-22 (×4): 120 mg via ORAL
  Filled 2019-02-19 (×4): qty 1

## 2019-02-19 MED ORDER — ONDANSETRON HCL 4 MG/2ML IJ SOLN
4.0000 mg | Freq: Once | INTRAMUSCULAR | Status: AC
  Administered 2019-02-19: 4 mg via INTRAVENOUS
  Filled 2019-02-19: qty 2

## 2019-02-19 MED ORDER — APIXABAN 5 MG PO TABS
5.0000 mg | ORAL_TABLET | Freq: Two times a day (BID) | ORAL | Status: DC
  Administered 2019-02-19 – 2019-02-22 (×7): 5 mg via ORAL
  Filled 2019-02-19 (×7): qty 1

## 2019-02-19 MED ORDER — DILTIAZEM HCL ER COATED BEADS 120 MG PO CP24
120.0000 mg | ORAL_CAPSULE | Freq: Once | ORAL | Status: AC
  Administered 2019-02-19: 120 mg via ORAL
  Filled 2019-02-19: qty 1

## 2019-02-19 MED ORDER — FOLIC ACID 1 MG PO TABS
1.0000 mg | ORAL_TABLET | Freq: Every day | ORAL | Status: DC
  Administered 2019-02-19 – 2019-02-22 (×4): 1 mg via ORAL
  Filled 2019-02-19 (×4): qty 1

## 2019-02-19 MED ORDER — SODIUM CHLORIDE 0.9 % IV SOLN
INTRAVENOUS | Status: DC
  Administered 2019-02-19 – 2019-02-22 (×5): via INTRAVENOUS

## 2019-02-19 MED ORDER — PANTOPRAZOLE SODIUM 40 MG PO TBEC
40.0000 mg | DELAYED_RELEASE_TABLET | Freq: Every day | ORAL | Status: DC
  Administered 2019-02-19 – 2019-02-22 (×4): 40 mg via ORAL
  Filled 2019-02-19 (×4): qty 1

## 2019-02-19 MED ORDER — METHOTREXATE 2.5 MG PO TABS: 15.0000 mg | ORAL_TABLET | ORAL | Status: DC

## 2019-02-19 MED ORDER — ACETAMINOPHEN 650 MG RE SUPP: 650.0000 mg | Freq: Four times a day (QID) | RECTAL | Status: DC | PRN

## 2019-02-19 MED ORDER — DILTIAZEM HCL 25 MG/5ML IV SOLN
10.0000 mg | Freq: Once | INTRAVENOUS | Status: AC
  Administered 2019-02-19: 10 mg via INTRAVENOUS
  Filled 2019-02-19: qty 5

## 2019-02-19 MED ORDER — MORPHINE SULFATE (PF) 2 MG/ML IV SOLN
2.0000 mg | Freq: Once | INTRAVENOUS | Status: AC
  Administered 2019-02-19: 2 mg via INTRAVENOUS
  Filled 2019-02-19: qty 1

## 2019-02-19 MED ORDER — ADULT MULTIVITAMIN W/MINERALS CH
1.0000 | ORAL_TABLET | Freq: Every day | ORAL | Status: DC
  Administered 2019-02-19 – 2019-02-22 (×4): 1 via ORAL
  Filled 2019-02-19 (×4): qty 1

## 2019-02-19 MED ORDER — ONDANSETRON HCL 4 MG/2ML IJ SOLN
4.0000 mg | Freq: Four times a day (QID) | INTRAMUSCULAR | Status: DC | PRN
  Administered 2019-02-20: 4 mg via INTRAVENOUS
  Filled 2019-02-19: qty 2

## 2019-02-19 MED ORDER — VITAMIN B-12 1000 MCG PO TABS
1000.0000 ug | ORAL_TABLET | Freq: Every day | ORAL | Status: DC
  Administered 2019-02-19 – 2019-02-22 (×4): 1000 ug via ORAL
  Filled 2019-02-19 (×4): qty 1

## 2019-02-19 MED ORDER — BISACODYL 5 MG PO TBEC: 5.0000 mg | DELAYED_RELEASE_TABLET | Freq: Every day | ORAL | Status: DC | PRN

## 2019-02-19 NOTE — ED Notes (Addendum)
Attempted to ambulate pt, pt unable to bare weight at this time due to pain in hip.  Pt states pain is in hip near her back.  Required 2 person assist back into bed.  Dr. Cinda Quest aware of same.  Will monitor.

## 2019-02-19 NOTE — ED Notes (Signed)
Tried to assist pt out of bed with wheelchair. Pt unable to move at all in the bed. Pt states pain to right hip with movement. Pt's HR 105, O2 dropped to 93% on 2L. Pt repositioned in bed and resting at this time. MD informed

## 2019-02-19 NOTE — ED Notes (Signed)
Pt gone to MRI

## 2019-02-19 NOTE — ED Provider Notes (Addendum)
[facility] Emergency Department Provider Note   ____________________________________________   First MD Initiated Contact with Patient 02/19/19 [phone]     (approximate)  I have reviewed the triage vital signs and the nursing notes.   HISTORY  Chief Complaint Fall   HPI Angela Leonard is a 85 y.o. female who reports that sometime during the night she fell down.  She spent an undetermined amount of time on the floor and cannot remember what happened or why she fell.  She does complain of some low back pain pain in the right hip and the right knee.  She is able to bend her hip and knee though.  She does not remember hitting her head and does not complain of a headache or neck pain.  She has no chest pain.  On arrival here she was in normal sinus rhythm but when she sat up she went into A. fib.  She is now in A. fib with RVR.  Review of her old records show that she has A. fib with RVR and takes diltiazem for it.  I will give her her morning dose of diltiazem.  History is limited by the fact that the patient does not remember what happened to her except for that she was laying on the floor for several hours.  She cannot tell me when exactly she fell.        Past Medical History:  Diagnosis Date   HLD (hyperlipidemia)    Hypertension     Patient Active Problem List   Diagnosis Date Noted   Sepsis ([place]) 08/03/2018   Atypical pneumonia 08/03/2018   Arthritis of right hip 08/03/2018   HTN (hypertension) 08/03/2018   HLD (hyperlipidemia) 08/03/2018   AKI (acute kidney injury) ([place]) 09/15/2017   Symptomatic cholelithiasis 10/13/2014    Past Surgical History:  Procedure Laterality Date   BACK SURGERY     CHOLECYSTECTOMY N/A 10/14/2014   Procedure: LAPAROSCOPIC CHOLECYSTECTOMY;  Surgeon: Marlyce Huge, MD;  Location: [facility] ORS;  Service: General;  Laterality: N/A;   COLON SURGERY      Prior to Admission medications   Medication Sig Start  Date End Date Taking? Authorizing Provider  cholecalciferol (VITAMIN D) 1000 units tablet Take 1,000 Units by mouth daily.   Yes [provider]  diltiazem (TIAZAC) 120 MG 24 hr capsule Take 120 mg by mouth daily. 12/16/18  Yes [provider]  ELIQUIS 5 MG TABS tablet Take 5 mg by mouth every 12 (twelve) hours.   Yes [provider]  folic acid (FOLVITE) 1 MG tablet Take 1 mg by mouth daily.   Yes [provider]  methotrexate (RHEUMATREX) 2.5 MG tablet Take 6 tablets by mouth every Tuesday.    Yes [provider]  Multiple Vitamin (MULTIVITAMIN WITH MINERALS) TABS tablet Take 1 tablet by mouth daily.   Yes [provider]  ramipril (ALTACE) 10 MG capsule Take 10 mg by mouth daily.   Yes [provider]  vitamin B-12 (CYANOCOBALAMIN) 1000 MCG tablet Take 1,000 mcg by mouth daily.   Yes [provider]  aspirin EC 81 MG tablet Take 81 mg by mouth at bedtime.    [provider]  oxyCODONE (OXY IR/ROXICODONE) 5 MG immediate release tablet Take 1 tablet (5 mg total) by mouth every 6 (six) hours as needed for severe pain. Patient not taking: Reported on 02/19/2019 08/06/18   Hillary Bow, MD  simvastatin (ZOCOR) 20 MG tablet Take 20 mg by mouth at  bedtime.    [provider]    Allergies Patient has no known allergies.  Family History  Problem Relation Age of Onset   Alzheimer's disease Mother    Parkinson's disease Mother    Cancer Father    Heart disease Sister    Parkinson's disease Brother    Heart disease Brother     Social History Social History   Tobacco Use   Smoking status: Former Smoker   Smokeless tobacco: Never Used  Substance Use Topics   Alcohol use: No   Drug use: No    Review of Systems  Constitutional: No fever/chills Eyes: No visual changes. ENT: No sore throat. Cardiovascular: Denies chest pain. Respiratory: Denies shortness of breath. Gastrointestinal: No  abdominal pain.  No nausea, no vomiting.  No diarrhea.  No constipation. Genitourinary: Negative for dysuria. Musculoskeletal: Low back pain. Skin: Negative for rash. Neurological: Negative for headaches, focal weakness   ____________________________________________   PHYSICAL EXAM:  VITAL SIGNS: ED Triage Vitals  Enc Vitals Group     BP 02/19/19 0647 117/81     Pulse Rate 02/19/19 0647 72     Resp 02/19/19 0647 17     Temp 02/19/19 0647 (!) 97.5 F (36.4 C)     Temp Source 02/19/19 0647 Oral     SpO2 02/19/19 0637 95 %     Weight 02/19/19 0643 160 lb (72.6 kg)     Height 02/19/19 0643 5\' 5"  (1.651 m)     Head Circumference --      Peak Flow --      Pain Score 02/19/19 0643 5     Pain Loc --      Pain Edu? --      Excl. in [place]? --     Constitutional: Alert and oriented. Well appearing and in no acute distress. Eyes: Conjunctivae are normal.  Head: Atraumatic. Nose: No congestion/rhinnorhea. Mouth/Throat: Mucous membranes are moist.  Oropharynx non-erythematous. Neck: No stridor.  Cardiovascular: Normal rate, regular rhythm. Grossly normal heart sounds.  Good peripheral circulation. Respiratory: Normal respiratory effort.  No retractions. Lungs CTAB. Gastrointestinal: Soft and nontender. No distention. No abdominal bruits. No CVA tenderness. Musculoskeletal: There is some pain on palpation of the right hip and the right knee.  No swelling.  There is good range of motion. Neurologic:  Normal speech and language. No gross focal neurologic deficits are appreciated. . Skin:  Skin is warm, dry and intact. No rash noted.   ____________________________________________   LABS (all labs ordered are listed, but only abnormal results are displayed)  Labs Reviewed  CBC WITH DIFFERENTIAL/PLATELET - Abnormal; Notable for the following components:      Result Value   WBC 12.8 (*)    Hemoglobin 15.7 (*)    HCT 46.7 (*)    MCV 100.2 (*)    Neutro Abs 11.3 (*)    All other  components within normal limits  COMPREHENSIVE METABOLIC PANEL - Abnormal; Notable for the following components:   Glucose, Bld 145 (*)    BUN 25 (*)    Creatinine, Ser 1.16 (*)    Albumin 3.1 (*)    GFR calc non Af Amer 44 (*)    GFR calc Af Amer 51 (*)    All other components within normal limits  CK - Abnormal; Notable for the following components:   Total CK 36 (*)    All other components within normal limits  URINALYSIS, COMPLETE (UACMP) WITH MICROSCOPIC  TROPONIN I (HIGH SENSITIVITY)  ____________________________________________  EKG  EKG shows intermittent A. fib with RVR at a rate of 128 right axis no apparent acute ST-T changes ____________________________________________  RADIOLOGY  ED MD interpretation: X-rays the hip back and knee read by radiology reviewed by me show only DJD.  Patient is unable to bear weight and complains of a lot of pain in the right hip with movement or palpation is unable to sit up all the way because of this.  We will get an MRI.  Official radiology report(s): Dg Lumbar Spine Complete  Result Date: 02/19/2019 CLINICAL DATA:  Low back pain post fall EXAM: LUMBAR SPINE - COMPLETE 4+ VIEW COMPARISON:  08/03/2018 FINDINGS: Five non-rib-bearing lumbar vertebra. Osseous demineralization. Multilevel facet degenerative changes. Disc space narrowing at L4-L5 with grade 1 anterolisthesis. Additional disc space narrowing L5-S1. No fracture, additional subluxation, or bone destruction. No spondylolysis. Atherosclerotic calcifications of aorta and iliac arteries as well as splenic artery. IMPRESSION: Degenerative disc and facet disease changes of the lumbar spine, not significantly changed. Osseous demineralization. No acute mallet. Electronically Signed   By: Lavonia Dana M.D.   On: 02/19/2019 09:07   Ct Head Wo Contrast  Result Date: 02/19/2019 CLINICAL DATA:  Unwitnessed fall EXAM: CT HEAD WITHOUT CONTRAST CT CERVICAL SPINE WITHOUT CONTRAST TECHNIQUE:  Multidetector CT imaging of the head and cervical spine was performed following the standard protocol without intravenous contrast. Multiplanar CT image reconstructions of the cervical spine were also generated. COMPARISON:  None. FINDINGS: CT HEAD FINDINGS Brain: No evidence of acute infarction, hemorrhage, hydrocephalus, extra-axial collection or mass lesion/mass effect. Extensive low-density changes within the periventricular and subcortical white matter compatible with chronic microvascular ischemic change. Mild-moderate diffuse cerebral volume loss. Vascular: Mild atherosclerotic calcifications involving the large vessels of the skull base. No unexpected hyperdense vessel. Skull: No skull fracture. Sinuses/Orbits: No acute finding. Other: None. CT CERVICAL SPINE FINDINGS Alignment: Reversal of the cervical lordosis. Grade 1 anterolisthesis C2 on C3, C3 on C4, and C7 on T1. Retrolisthesis C4 on C5. Skull base and vertebrae: No acute fracture. No primary bone lesion or focal pathologic process. Soft tissues and spinal canal: No prevertebral fluid or swelling. No visible canal hematoma. Disc levels: Severe multilevel degenerative changes most pronounced at C4-5, C5-6, and C6-7. Prominent posterior disc osteophyte complex at C4-5 likely results in high-grade canal stenosis. Bulky bilateral facet and uncovertebral arthropathy result in areas of severe foraminal stenosis, most pronounced at C4-5 on the left. Upper chest: Centrilobular emphysema within the lung apices. Carotid artery calcifications. Other: None. IMPRESSION: 1. No acute intracranial findings. 2. Severe multilevel degenerative changes of the cervical spine without acute fracture or dislocation. 3. Chronic microvascular ischemic disease and cerebral volume loss. 4. High-grade canal stenosis at the C4-5 level secondary to prominent chronic-appearing disc osteophyte complex and bulky left-sided facet arthropathy. Electronically Signed   By: Davina Poke M.D.   On: 02/19/2019 08:33   Ct Cervical Spine Wo Contrast  Result Date: 02/19/2019 CLINICAL DATA:  Unwitnessed fall EXAM: CT HEAD WITHOUT CONTRAST CT CERVICAL SPINE WITHOUT CONTRAST TECHNIQUE: Multidetector CT imaging of the head and cervical spine was performed following the standard protocol without intravenous contrast. Multiplanar CT image reconstructions of the cervical spine were also generated. COMPARISON:  None. FINDINGS: CT HEAD FINDINGS Brain: No evidence of acute infarction, hemorrhage, hydrocephalus, extra-axial collection or mass lesion/mass effect. Extensive low-density changes within the periventricular and subcortical white matter compatible with chronic microvascular ischemic change. Mild-moderate diffuse cerebral volume loss. Vascular: Mild atherosclerotic calcifications involving the  large vessels of the skull base. No unexpected hyperdense vessel. Skull: No skull fracture. Sinuses/Orbits: No acute finding. Other: None. CT CERVICAL SPINE FINDINGS Alignment: Reversal of the cervical lordosis. Grade 1 anterolisthesis C2 on C3, C3 on C4, and C7 on T1. Retrolisthesis C4 on C5. Skull base and vertebrae: No acute fracture. No primary bone lesion or focal pathologic process. Soft tissues and spinal canal: No prevertebral fluid or swelling. No visible canal hematoma. Disc levels: Severe multilevel degenerative changes most pronounced at C4-5, C5-6, and C6-7. Prominent posterior disc osteophyte complex at C4-5 likely results in high-grade canal stenosis. Bulky bilateral facet and uncovertebral arthropathy result in areas of severe foraminal stenosis, most pronounced at C4-5 on the left. Upper chest: Centrilobular emphysema within the lung apices. Carotid artery calcifications. Other: None. IMPRESSION: 1. No acute intracranial findings. 2. Severe multilevel degenerative changes of the cervical spine without acute fracture or dislocation. 3. Chronic microvascular ischemic disease and cerebral  volume loss. 4. High-grade canal stenosis at the C4-5 level secondary to prominent chronic-appearing disc osteophyte complex and bulky left-sided facet arthropathy. Electronically Signed   By: Davina Poke M.D.   On: 02/19/2019 08:33   Mr Hip Right Wo Contrast  Result Date: 02/19/2019 CLINICAL DATA:  Right hip pain after fall. EXAM: MR OF THE RIGHT HIP WITHOUT CONTRAST TECHNIQUE: Multiplanar, multisequence MR imaging was performed. No intravenous contrast was administered. COMPARISON:  Right hip x-rays from same day. Right hip MRI dated August 03, 2018 FINDINGS: Bones: There is no evidence of acute fracture, dislocation or avascular necrosis. No focal bone lesion. The visualized sacroiliac joints and symphysis pubis appear normal. Articular cartilage and labrum Articular cartilage: Unchanged asymmetric right greater than left partial-thickness cartilage loss. No subchondral signal abnormality. Labrum: Grossly intact, although evaluation is limited due to lack of intra-articular fluid. No paralabral abnormality. Joint or bursal effusion Joint effusion: Unchanged small right hip joint effusion with synovitis. Bursae: No focal periarticular fluid collection. Muscles and tendons Muscles and tendons: The visualized gluteus, hamstring and iliopsoas tendons appear normal. No significant muscle edema or atrophy. Other findings Miscellaneous: Unchanged 1.1 cm ganglion cyst anterior to the right ischial tuberosity. Unchanged right paramedian abdominal wall hernia containing a portion of the transverse colon. Unchanged colonic diverticulosis. Unchanged fluid within the endometrial canal. IMPRESSION: 1.  No acute osseous abnormality. 2. Unchanged moderate right and mild left hip osteoarthritis. Electronically Signed   By: Titus Dubin M.D.   On: 02/19/2019 11:54   Dg Knee Complete 4 Views Right  Result Date: 02/19/2019 CLINICAL DATA:  Right knee pain post fall EXAM: RIGHT KNEE - COMPLETE 4+ VIEW COMPARISON:  None  FINDINGS: Osseous demineralization. Joint space narrowing at medial compartment and probably patellofemoral joint. Tiny patellar spurs. No acute fracture, dislocation, or bone destruction. No joint effusion. Atherosclerotic calcifications of the distal superficial femoral and popliteal arteries. IMPRESSION: Mild degenerative changes RIGHT knee. No acute abnormalities. Electronically Signed   By: Lavonia Dana M.D.   On: 02/19/2019 09:03   Dg Hip Unilat W Or Wo Pelvis 2-3 Views Right  Result Date: 02/19/2019 CLINICAL DATA:  RIGHT hip pain post fall EXAM: DG HIP (WITH OR WITHOUT PELVIS) 2-3V RIGHT COMPARISON:  08/03/2018 FINDINGS: Osseous demineralization. SI joints and LEFT hip joint space preserved. Mild joint space narrowing and spur formation at the RIGHT hip. No acute fracture, dislocation, or bone destruction. Scattered atherosclerotic calcifications. IMPRESSION: Osseous demineralization with degenerative changes of the RIGHT hip joint. No acute abnormalities. Electronically Signed   By: Elta Guadel[place]  Thornton Papas M.D.   On: 02/19/2019 09:06    ____________________________________________   PROCEDURES  Procedure(s) performed (including Critical Care):  Procedures   ____________________________________________   INITIAL IMPRESSION / ASSESSMENT AND PLAN / ED COURSE Angela Leonard was evaluated in Emergency Department on 02/19/2019 for the symptoms described in the history of present illness. She was evaluated in the context of the global COVID-19 pandemic, which necessitated consideration that the patient might be at risk for infection with the SARS-CoV-2 virus that causes COVID-19. Institutional protocols and algorithms that pertain to the evaluation of patients at risk for COVID-19 are in a state of rapid change based on information released by regulatory bodies including the CDC and federal and state organizations. These policies and algorithms were followed during the patient's care in the ED.  Patient's  heart rate comes down with 1 shot of IV diltiazem and stays down but she will not get up and walk because her right hip hurts too much.  She says she lives by herself.  I cannot discharge her if she cannot move by herself.  We will get her in the hospital.  We will work to make sure that her rate stays controlled and that her hip pain either improves or we get her physical therapy etc.             ____________________________________________   FINAL CLINICAL IMPRESSION(S) / ED DIAGNOSES  Final diagnoses:  Fall, initial encounter  Right hip pain     ED Discharge Orders    None       Note:  This document was prepared using Dragon voice recognition software and may include unintentional dictation errors.    Nena Polio, MD 02/19/19 1332    Nena Polio, MD 02/19/19 [phone]

## 2019-02-19 NOTE — H&P (Signed)
[facility] NAME: Angela Leonard    MR#:  [id number]  DATE OF BIRTH:  Nov 23, 1934  DATE OF ADMISSION:  02/19/2019  PRIMARY CARE PHYSICIAN: Derinda Late, MD   REQUESTING/REFERRING PHYSICIAN:   CHIEF COMPLAINT: Dr. Corinna Capra   Chief Complaint  Patient presents with  . Fall    HISTORY OF PRESENT ILLNESS:  Angela Leonard  is a 84 y.o. female with a known history of chronic atrial fibrillation, essential hypertension, hyperlipidemia brought in from home due to fall, patient blood work is essentially normal including right hip x-rays.  Because patient is not able to move at all with pain in the right hip with movement patient had x-ray of the right hip, MRI of the right hip which per negative for acute fractures.  ER physician asked [place] to admit for ambulatory difficulty.  Patient says that she is here because of abdominal pain that is going on for 2 days, mainly in the midepigastric area , radiating to the back occasionally, no nausea or vomiting.  No constipation or diarrhea.  No fever.  Found to have atrial fibrillation with RVR with heart rate 123 bpm.  Patient is on oral Cardizem, Eliquis at home, in the ER patient received couple of doses of IV Cardizem, started on oral Cardizem, now when I saw the patient heart rate is around 90 bpm.  PAST MEDICAL HISTORY:   Past Medical History:  Diagnosis Date  . HLD (hyperlipidemia)   . Hypertension     PAST SURGICAL HISTOIRY:   Past Surgical History:  Procedure Laterality Date  . BACK SURGERY    . CHOLECYSTECTOMY N/A 10/14/2014   Procedure: LAPAROSCOPIC CHOLECYSTECTOMY;  Surgeon: Marlyce Huge, MD;  Location: [facility] ORS;  Service: General;  Laterality: N/A;  . COLON SURGERY      SOCIAL HISTORY:   Social History   Tobacco Use  . Smoking status: Former Research scientist (life sciences)  . Smokeless tobacco: Never Used  Substance Use Topics  . Alcohol use: No    FAMILY HISTORY:   Family  History  Problem Relation Age of Onset  . Alzheimer's disease Mother   . Parkinson's disease Mother   . Cancer Father   . Heart disease Sister   . Parkinson's disease Brother   . Heart disease Brother     DRUG ALLERGIES:  No Known Allergies  REVIEW OF SYSTEMS:  CONSTITUTIONAL: No fever, fatigue or weakness.  Appears very comfortable EYES: No blurred or double vision.  EARS, NOSE, AND THROAT: No tinnitus or ear pain.  RESPIRATORY: No cough, shortness of breath, wheezing or hemoptysis.  CARDIOVASCULAR: No chest pain, orthopnea, edema.  GASTROINTESTINAL: No nausea, vomiting, diarrhea or abdominal pain.  GENITOURINARY: No dysuria, hematuria.  ENDOCRINE: No polyuria, nocturia,  HEMATOLOGY: No anemia, easy bruising or bleeding SKIN: No rash or lesion. MUSCULOSKELETAL: No joint pain or arthritis.   NEUROLOGIC: No tingling, numbness, weakness.  PSYCHIATRY: No anxiety or depression.   MEDICATIONS AT HOME:   Prior to Admission medications   Medication Sig Start Date End Date Taking? Authorizing Provider  cholecalciferol (VITAMIN D) 1000 units tablet Take 1,000 Units by mouth daily.   Yes [provider]  diltiazem (TIAZAC) 120 MG 24 hr capsule Take 120 mg by mouth daily. 12/16/18  Yes [provider]  ELIQUIS 5 MG TABS tablet Take 5 mg by mouth every 12 (twelve) hours.   Yes [provider]  folic acid (FOLVITE) 1 MG tablet Take 1 mg  by mouth daily.   Yes [provider]  methotrexate (RHEUMATREX) 2.5 MG tablet Take 6 tablets by mouth every Tuesday.    Yes [provider]  Multiple Vitamin (MULTIVITAMIN WITH MINERALS) TABS tablet Take 1 tablet by mouth daily.   Yes [provider]  ramipril (ALTACE) 10 MG capsule Take 10 mg by mouth daily.   Yes [provider]  vitamin B-12 (CYANOCOBALAMIN) 1000 MCG tablet Take 1,000 mcg by mouth daily.   Yes [provider]  aspirin EC 81 MG tablet Take 81 mg by mouth at bedtime.     [provider]  oxyCODONE (OXY IR/ROXICODONE) 5 MG immediate release tablet Take 1 tablet (5 mg total) by mouth every 6 (six) hours as needed for severe pain. Patient not taking: Reported on 02/19/2019 08/06/18   Hillary Bow, MD  simvastatin (ZOCOR) 20 MG tablet Take 20 mg by mouth at bedtime.    [provider]      VITAL SIGNS:  Blood pressure 112/68, pulse (!) 111, temperature (!) 97.5 F (36.4 C), temperature source Oral, resp. rate (!) 27, height 5\' 5"  (1.651 m), weight 72.6 kg, SpO2 100 %.  PHYSICAL EXAMINATION:  GENERAL:  84 y.o.-year-old patient lying in the bed with no acute distress.  Appears comfortable EYES: Pupils equal, round, reactive to light and accommodation. No scleral icterus. Extraocular muscles intact.  HEENT: Head atraumatic, normocephalic. Oropharynx and nasopharynx clear.  NECK:  Supple, no jugular venous distention. No thyroid enlargement, no tenderness.  LUNGS: Normal breath sounds bilaterally, no wheezing, rales,rhonchi or crepitation. No use of accessory muscles of respiration.  CARDIOVASCULAR: S1, S2 iregular. No murmurs, rubs, or gallops.  ABDOMEN: Soft, nontender, nondistended. Bowel sounds present. No organomegaly or mass.  EXTREMITIES: No pedal edema, cyanosis, or clubbing.  Unable to raise right leg ,says it hurts  in the lower back. NEUROLOGIC: Cranial nerves II through XII are intact. Muscle strength 5/5 in all extremities. Sensation intact. Gait not checked.  PSYCHIATRIC: The patient is alert and oriented x 3.  SKIN: No obvious rash, lesion, or ulcer.   LABORATORY PANEL:   CBC Recent Labs  Lab 02/19/19 0707  WBC 12.8*  HGB 15.7*  HCT 46.7*  PLT 297   ------------------------------------------------------------------------------------------------------------------  Chemistries  Recent Labs  Lab 02/19/19 0707  Angela 136  K 3.5  CL 102  CO2 22  GLUCOSE 145*  BUN 25*  CREATININE 1.16*  CALCIUM 8.9  AST 33  ALT 21   ALKPHOS 73  BILITOT 1.0   ------------------------------------------------------------------------------------------------------------------  Cardiac Enzymes No results for input(s): TROPONINI in the last 168 hours. ------------------------------------------------------------------------------------------------------------------  RADIOLOGY:  Dg Lumbar Spine Complete  Result Date: 02/19/2019 CLINICAL DATA:  Low back pain post fall EXAM: LUMBAR SPINE - COMPLETE 4+ VIEW COMPARISON:  08/03/2018 FINDINGS: Five non-rib-bearing lumbar vertebra. Osseous demineralization. Multilevel facet degenerative changes. Disc space narrowing at L4-L5 with grade 1 anterolisthesis. Additional disc space narrowing L5-S1. No fracture, additional subluxation, or bone destruction. No spondylolysis. Atherosclerotic calcifications of aorta and iliac arteries as well as splenic artery. IMPRESSION: Degenerative disc and facet disease changes of the lumbar spine, not significantly changed. Osseous demineralization. No acute mallet. Electronically Signed   By: Lavonia Dana M.D.   On: 02/19/2019 09:07   Ct Head Wo Contrast  Result Date: 02/19/2019 CLINICAL DATA:  Unwitnessed fall EXAM: CT HEAD WITHOUT CONTRAST CT CERVICAL SPINE WITHOUT CONTRAST TECHNIQUE: Multidetector CT imaging of the head and cervical spine was performed following the standard protocol without intravenous contrast.  Multiplanar CT image reconstructions of the cervical spine were also generated. COMPARISON:  None. FINDINGS: CT HEAD FINDINGS Brain: No evidence of acute infarction, hemorrhage, hydrocephalus, extra-axial collection or mass lesion/mass effect. Extensive low-density changes within the periventricular and subcortical white matter compatible with chronic microvascular ischemic change. Mild-moderate diffuse cerebral volume loss. Vascular: Mild atherosclerotic calcifications involving the large vessels of the skull base. No unexpected hyperdense vessel.  Skull: No skull fracture. Sinuses/Orbits: No acute finding. Other: None. CT CERVICAL SPINE FINDINGS Alignment: Reversal of the cervical lordosis. Grade 1 anterolisthesis C2 on C3, C3 on C4, and C7 on T1. Retrolisthesis C4 on C5. Skull base and vertebrae: No acute fracture. No primary bone lesion or focal pathologic process. Soft tissues and spinal canal: No prevertebral fluid or swelling. No visible canal hematoma. Disc levels: Severe multilevel degenerative changes most pronounced at C4-5, C5-6, and C6-7. Prominent posterior disc osteophyte complex at C4-5 likely results in high-grade canal stenosis. Bulky bilateral facet and uncovertebral arthropathy result in areas of severe foraminal stenosis, most pronounced at C4-5 on the left. Upper chest: Centrilobular emphysema within the lung apices. Carotid artery calcifications. Other: None. IMPRESSION: 1. No acute intracranial findings. 2. Severe multilevel degenerative changes of the cervical spine without acute fracture or dislocation. 3. Chronic microvascular ischemic disease and cerebral volume loss. 4. High-grade canal stenosis at the C4-5 level secondary to prominent chronic-appearing disc osteophyte complex and bulky left-sided facet arthropathy. Electronically Signed   By: Davina Poke M.D.   On: 02/19/2019 08:33   Ct Cervical Spine Wo Contrast  Result Date: 02/19/2019 CLINICAL DATA:  Unwitnessed fall EXAM: CT HEAD WITHOUT CONTRAST CT CERVICAL SPINE WITHOUT CONTRAST TECHNIQUE: Multidetector CT imaging of the head and cervical spine was performed following the standard protocol without intravenous contrast. Multiplanar CT image reconstructions of the cervical spine were also generated. COMPARISON:  None. FINDINGS: CT HEAD FINDINGS Brain: No evidence of acute infarction, hemorrhage, hydrocephalus, extra-axial collection or mass lesion/mass effect. Extensive low-density changes within the periventricular and subcortical white matter compatible with chronic  microvascular ischemic change. Mild-moderate diffuse cerebral volume loss. Vascular: Mild atherosclerotic calcifications involving the large vessels of the skull base. No unexpected hyperdense vessel. Skull: No skull fracture. Sinuses/Orbits: No acute finding. Other: None. CT CERVICAL SPINE FINDINGS Alignment: Reversal of the cervical lordosis. Grade 1 anterolisthesis C2 on C3, C3 on C4, and C7 on T1. Retrolisthesis C4 on C5. Skull base and vertebrae: No acute fracture. No primary bone lesion or focal pathologic process. Soft tissues and spinal canal: No prevertebral fluid or swelling. No visible canal hematoma. Disc levels: Severe multilevel degenerative changes most pronounced at C4-5, C5-6, and C6-7. Prominent posterior disc osteophyte complex at C4-5 likely results in high-grade canal stenosis. Bulky bilateral facet and uncovertebral arthropathy result in areas of severe foraminal stenosis, most pronounced at C4-5 on the left. Upper chest: Centrilobular emphysema within the lung apices. Carotid artery calcifications. Other: None. IMPRESSION: 1. No acute intracranial findings. 2. Severe multilevel degenerative changes of the cervical spine without acute fracture or dislocation. 3. Chronic microvascular ischemic disease and cerebral volume loss. 4. High-grade canal stenosis at the C4-5 level secondary to prominent chronic-appearing disc osteophyte complex and bulky left-sided facet arthropathy. Electronically Signed   By: Davina Poke M.D.   On: 02/19/2019 08:33   Mr Hip Right Wo Contrast  Result Date: 02/19/2019 CLINICAL DATA:  Right hip pain after fall. EXAM: MR OF THE RIGHT HIP WITHOUT CONTRAST TECHNIQUE: Multiplanar, multisequence MR imaging was performed. No intravenous contrast was administered.  COMPARISON:  Right hip x-rays from same day. Right hip MRI dated August 03, 2018 FINDINGS: Bones: There is no evidence of acute fracture, dislocation or avascular necrosis. No focal bone lesion. The visualized  sacroiliac joints and symphysis pubis appear normal. Articular cartilage and labrum Articular cartilage: Unchanged asymmetric right greater than left partial-thickness cartilage loss. No subchondral signal abnormality. Labrum: Grossly intact, although evaluation is limited due to lack of intra-articular fluid. No paralabral abnormality. Joint or bursal effusion Joint effusion: Unchanged small right hip joint effusion with synovitis. Bursae: No focal periarticular fluid collection. Muscles and tendons Muscles and tendons: The visualized gluteus, hamstring and iliopsoas tendons appear normal. No significant muscle edema or atrophy. Other findings Miscellaneous: Unchanged 1.1 cm ganglion cyst anterior to the right ischial tuberosity. Unchanged right paramedian abdominal wall hernia containing a portion of the transverse colon. Unchanged colonic diverticulosis. Unchanged fluid within the endometrial canal. IMPRESSION: 1.  No acute osseous abnormality. 2. Unchanged moderate right and mild left hip osteoarthritis. Electronically Signed   By: Titus Dubin M.D.   On: 02/19/2019 11:54   Dg Knee Complete 4 Views Right  Result Date: 02/19/2019 CLINICAL DATA:  Right knee pain post fall EXAM: RIGHT KNEE - COMPLETE 4+ VIEW COMPARISON:  None FINDINGS: Osseous demineralization. Joint space narrowing at medial compartment and probably patellofemoral joint. Tiny patellar spurs. No acute fracture, dislocation, or bone destruction. No joint effusion. Atherosclerotic calcifications of the distal superficial femoral and popliteal arteries. IMPRESSION: Mild degenerative changes RIGHT knee. No acute abnormalities. Electronically Signed   By: Lavonia Dana M.D.   On: 02/19/2019 09:03   Dg Hip Unilat W Or Wo Pelvis 2-3 Views Right  Result Date: 02/19/2019 CLINICAL DATA:  RIGHT hip pain post fall EXAM: DG HIP (WITH OR WITHOUT PELVIS) 2-3V RIGHT COMPARISON:  08/03/2018 FINDINGS: Osseous demineralization. SI joints and LEFT hip joint  space preserved. Mild joint space narrowing and spur formation at the RIGHT hip. No acute fracture, dislocation, or bone destruction. Scattered atherosclerotic calcifications. IMPRESSION: Osseous demineralization with degenerative changes of the RIGHT hip joint. No acute abnormalities. Electronically Signed   By: Lavonia Dana M.D.   On: 02/19/2019 09:06    EKG:   Orders placed or performed during the hospital encounter of 02/19/19  . EKG 12-Lead  . EKG 12-Lead  . EKG 12-Lead  . EKG 12-Lead  . ED EKG  . ED EKG    IMPRESSION AND PLAN:  84 year old female patient with history of essential hypertension, hyperlipidemia compartment atrial fibrillation, rheumatoid arthritis, CKD stage III comes from home because of the fall, now has inability to move so admitting the patient for right hip pain with x-rays and MRI of the right hip negative for fractures now having having ambulatory difficulty.  Fibrillation with RVR, heart rate 143 bpm when she came but improved with Cardizem IV, now she is around 90 bpm, continue oral Cardizem, Eliquis, monitor on telemetry. 2.  Fall, right hip pain, ambulatory difficulty, x-rays negative for fractures, continue pain medicines, physical therapy consult. 3.  Right hip pain is chronic according to documentation from PCP note in March, physical therapy consult for evaluation.  COVID-19 test is ordered for asymptomatic screening, waiting for results, continue droplet precautions All the records are reviewed and case discussed with ED provider. Management plans discussed with the patient, family and they are in agreement.  CODE STATUS: Full code TOTAL TIME TAKING CARE OF THIS PATIENT: 50 minutes.    Epifanio Lesches M.D on 02/19/2019 at 1:39 PM  Between 7am to 6pm - Pager - [phone]  After 6pm go to [email or web address] - password [email or web address] [facility]  Office  [phone]  CC: Primary care physician; Derinda Late, MD  Note: This  dictation was prepared with Dragon dictation along with smaller phrase technology. Any transcriptional errors that result from this process are unintentional.

## 2019-02-19 NOTE — ED Notes (Signed)
Patient transported to CT and XR

## 2019-02-19 NOTE — ED Notes (Signed)
Pt checked and dry at this time. Pt states she doesn't need to pee

## 2019-02-19 NOTE — ED Triage Notes (Signed)
Pt arrives to ED via [facility] from home with c/o unwitnessed fall about 3 hrs PTA. Pt is unsure if she lost consciousness, but states she can't remember falling or any events leading up to it. Pt reports right hip and lower back pain; no neck or head pain. EMS reports both pt and pt's family unsure if pt takes any r/x'd anti-coagulants. Pt is A&Ox3 (unable to state correct date, but can recite name, DOB, location, and situation). Pt denies any c/o CP or SHOB; RR even, regular, and unlabored.

## 2019-02-19 NOTE — ED Notes (Addendum)
Drip held at this time per [facility] MD orders

## 2019-02-19 NOTE — Evaluation (Signed)
Physical Therapy Evaluation Patient Details Name: Angela Leonard MRN: [id number] DOB: 20-Jan-1935 Today's Date: 02/19/2019   History of Present Illness  presented to ER secondary to fall with worsening R hip pain, noted with elevated HR; admitted for management of Afib with RVR (now controlled with PO cardizem).  R hip, R knee, L spine, CTH and CT c-spine all negative for acute injury.  Clinical Impression  Patient resting in bed upon arrival to session, easily arousable to voice and agreeable to session.  Oriented to self only, but participates with session and follows simple commands as appropriate.  R hip/knee movement with significant strength/ROM limitations due to pain (FACES 8/10); difficult to identify point tenderness or appreciate full description of pain due to baseline cognitive deficits.  Currently requiring max/total assist for bed mobility; mod/max assist for sit/stand. Unable to tolerate stepping or transfer OOB due to pain.  Will continue to assess/progress in subsequent sessions as medically appropriate. Would benefit from skilled PT to address above deficits and promote optimal return to PLOF; recommend transition to STR upon discharge from acute hospitalization.     Follow Up Recommendations SNF    Equipment Recommendations       Recommendations for Other Services       Precautions / Restrictions Precautions Precautions: Fall Restrictions Weight Bearing Restrictions: No      Mobility  Bed Mobility Overal bed mobility: Needs Assistance Bed Mobility: Supine to Sit;Sit to Supine     Supine to sit: Mod assist;Max assist Sit to supine: Max assist;Total assist      Transfers Overall transfer level: Needs assistance Equipment used: Rolling walker (2 wheeled) Transfers: Sit to/from Stand Sit to Stand: Mod assist;Max assist         General transfer comment: assist for lift off, standing balance, weight shift  Ambulation/Gait             General Gait  Details: unable to tolerate due to R LE pain  Stairs            Wheelchair Mobility    Modified Rankin (Stroke Patients Only)       Balance Overall balance assessment: Needs assistance Sitting-balance support: No upper extremity supported;Feet supported Sitting balance-[name] Scale: Fair     Standing balance support: Bilateral upper extremity supported Standing balance-[name] Scale: Poor                               Pertinent Vitals/Pain Pain Assessment: Faces Faces Pain Scale: Hurts whole lot Pain Location: R hip Pain Descriptors / Indicators: Aching;Guarding;Grimacing Pain Intervention(s): Limited activity within patient's tolerance;Monitored during session;Repositioned    Home Living Family/patient expects to be discharged to:: Private residence Living Arrangements: Children Available Help at Discharge: Family Type of Home: House Home Access: Level entry;Stairs to enter   Entrance Stairs-Number of Steps: 1 Home Layout: One level Home Equipment: Environmental consultant - 2 wheels;Toilet riser;Shower seat Additional Comments: Patient questionable historian; will verify with family as available    Prior Function Level of Independence: Independent         Comments: Per patient report, indep with ADLs, household mobilization; denies fall history (even one leading to this admission).  Questionable historian; will verify with family as available.     Hand Dominance        Extremity/Trunk Assessment   Upper Extremity Assessment Upper Extremity Assessment: Overall WFL for tasks assessed    Lower Extremity Assessment Lower Extremity Assessment: (R hip and  knee grossly 2+ to 3-/5, limited by pain; ankle 4/5.  L LE grossly WFL)       Communication   Communication: No difficulties  Cognition Arousal/Alertness: Awake/alert Behavior During Therapy: WFL for tasks assessed/performed Overall Cognitive Status: No family/caregiver present to determine baseline  cognitive functioning                                 General Comments: oriented to self only; limited recall of reorientation or new information.      General Comments      Exercises     Assessment/Plan    PT Assessment Patient needs continued PT services  PT Problem List Decreased strength;Decreased range of motion;Decreased activity tolerance;Decreased balance;Decreased mobility;Decreased cognition;Decreased knowledge of use of DME;Decreased safety awareness;Decreased knowledge of precautions;Pain       PT Treatment Interventions DME instruction;Gait training;Functional mobility training;Therapeutic activities;Therapeutic exercise;Balance training;Patient/family education;Cognitive remediation    PT Goals (Current goals can be found in the Care Plan section)  Acute Rehab PT Goals Patient Stated Goal: to try to go to the bathroom PT Goal Formulation: With patient Time For Goal Achievement: 03/05/19 Potential to Achieve Goals: Fair    Frequency Min 2X/week   Barriers to discharge Decreased caregiver support      Co-evaluation               AM-PAC PT "6 Clicks" Mobility  Outcome Measure Help needed turning from your back to your side while in a flat bed without using bedrails?: A Lot Help needed moving from lying on your back to sitting on the side of a flat bed without using bedrails?: A Lot Help needed moving to and from a bed to a chair (including a wheelchair)?: A Lot Help needed standing up from a chair using your arms (e.g., wheelchair or bedside chair)?: A Lot Help needed to walk in hospital room?: Total Help needed climbing 3-5 steps with a railing? : Total 6 Click Score: 10    End of Session Equipment Utilized During Treatment: Gait belt Activity Tolerance: Patient limited by pain Patient left: in bed;with call Lozinski/phone within reach Nurse Communication: Mobility status PT Visit Diagnosis: Muscle weakness (generalized)  (M62.81);Difficulty in walking, not elsewhere classified (R26.2);Pain Pain - Right/Left: Right Pain - part of body: Hip    Time: 1549-1609 PT Time Calculation (min) (ACUTE ONLY): 20 min   Charges:   PT Evaluation $PT Eval Moderate Complexity: 1 Mod          Tevyn Codd H. Owens Shark, PT, DPT, NCS 02/19/19, 4:58 PM [phone]

## 2019-02-20 LAB — URINALYSIS, COMPLETE (UACMP) WITH MICROSCOPIC
Bilirubin Urine: NEGATIVE
Glucose, UA: NEGATIVE mg/dL
Ketones, ur: 20 mg/dL — AB
Nitrite: POSITIVE — AB
Protein, ur: NEGATIVE mg/dL
Specific Gravity, Urine: 1.021 (ref 1.005–1.030)
pH: 5 (ref 5.0–8.0)

## 2019-02-20 LAB — BASIC METABOLIC PANEL
Anion gap: 10 (ref 5–15)
BUN: 26 mg/dL — ABNORMAL HIGH (ref 8–23)
CO2: 25 mmol/L (ref 22–32)
Calcium: 9.2 mg/dL (ref 8.9–10.3)
Chloride: 103 mmol/L (ref 98–111)
Creatinine, Ser: 0.89 mg/dL (ref 0.44–1.00)
GFR calc Af Amer: >60 mL/min (ref >60)
GFR calc non Af Amer: >60 mL/min (ref >60)
Glucose, Bld: 97 mg/dL (ref 70–99)
Potassium: 4 mmol/L (ref 3.5–5.1)
Sodium: 138 mmol/L (ref 135–145)

## 2019-02-20 LAB — CBC
HCT: 42.3 % (ref 36.0–46.0)
Hemoglobin: 13.9 g/dL (ref 12.0–15.0)
MCH: 33.8 pg (ref 26.0–34.0)
MCHC: 32.9 g/dL (ref 30.0–36.0)
MCV: 102.9 fL — ABNORMAL HIGH (ref 80.0–100.0)
Platelets: 301 10*3/uL (ref 150–400)
RBC: 4.11 MIL/uL (ref 3.87–5.11)
RDW: 14.2 % (ref 11.5–15.5)
WBC: 11 10*3/uL — ABNORMAL HIGH (ref 4.0–10.5)
nRBC: 0 % (ref 0.0–0.2)

## 2019-02-20 LAB — GLUCOSE, CAPILLARY: Glucose-Capillary: 96 mg/dL (ref 70–99)

## 2019-02-20 NOTE — Plan of Care (Addendum)
Cardiac rhythm is a-fib with controlled rate.  Weakness noted with repositioning in bed.  PT recommended SNF placement.   Problem: Education: Goal: Knowledge of General Education information will improve Description: Including pain rating scale, medication(s)/side effects and non-pharmacologic comfort measures Outcome: Progressing   Problem: Health Behavior/Discharge Planning: Goal: Ability to manage health-related needs will improve Outcome: Progressing   Problem: Clinical Measurements: Goal: Ability to maintain clinical measurements within normal limits will improve Outcome: Progressing Goal: Will remain free from infection Outcome: Progressing Goal: Diagnostic test results will improve Outcome: Progressing Goal: Respiratory complications will improve Outcome: Progressing Goal: Cardiovascular complication will be avoided Outcome: Progressing   Problem: Activity: Goal: Risk for activity intolerance will decrease Outcome: Progressing   Problem: Nutrition: Goal: Adequate nutrition will be maintained Outcome: Progressing   Problem: Coping: Goal: Level of anxiety will decrease Outcome: Progressing   Problem: Elimination: Goal: Will not experience complications related to bowel motility Outcome: Progressing Goal: Will not experience complications related to urinary retention Outcome: Progressing   Problem: Pain Managment: Goal: General experience of comfort will improve Outcome: Progressing   Problem: Safety: Goal: Ability to remain free from injury will improve Outcome: Progressing   Problem: Skin Integrity: Goal: Risk for impaired skin integrity will decrease Outcome: Progressing

## 2019-02-20 NOTE — Progress Notes (Signed)
[place] [facility] [place] NAME: Angela Leonard    MR#:  [id number]  DATE OF BIRTH:  05/04/1935  SUBJECTIVE:  CHIEF COMPLAINT: Patient denies any chest pain or palpitations and agreeable to go to SNF  REVIEW OF SYSTEMS:  CONSTITUTIONAL: No fever, fatigue or weakness.  EYES: No blurred or double vision.  EARS, NOSE, AND THROAT: No tinnitus or ear pain.  RESPIRATORY: No cough, shortness of breath, wheezing or hemoptysis.  CARDIOVASCULAR: No chest pain, orthopnea, edema.  GASTROINTESTINAL: No nausea, vomiting, diarrhea or abdominal pain.  GENITOURINARY: No dysuria, hematuria.  ENDOCRINE: No polyuria, nocturia,  HEMATOLOGY: No anemia, easy bruising or bleeding SKIN: No rash or lesion. MUSCULOSKELETAL: No joint pain or arthritis.   NEUROLOGIC: No tingling, numbness, weakness.  PSYCHIATRY: No anxiety or depression.   DRUG ALLERGIES:  No Known Allergies  VITALS:  Blood pressure (!) 102/58, pulse 79, temperature 97.7 F (36.5 C), resp. rate 18, height 5\' 5"  (1.651 m), weight 74.4 kg, SpO2 94 %.  PHYSICAL EXAMINATION:  GENERAL:  84 y.o.-year-old patient lying in the bed with no acute distress.  EYES: Pupils equal, round, reactive to light and accommodation. No scleral icterus. Extraocular muscles intact.  HEENT: Head atraumatic, normocephalic. Oropharynx and nasopharynx clear.  NECK:  Supple, no jugular venous distention. No thyroid enlargement, no tenderness.  LUNGS: Normal breath sounds bilaterally, no wheezing, rales,rhonchi or crepitation. No use of accessory muscles of respiration.  CARDIOVASCULAR: Irregularly irregular no murmurs, rubs, or gallops.  ABDOMEN: Soft, nontender, nondistended. Bowel sounds present. No organomegaly or mass.  EXTREMITIES: No pedal edema, cyanosis, or clubbing.  NEUROLOGIC: Cranial nerves II through XII are intact. Muscle strength weak in all extremities. Sensation intact. Gait not checked.  PSYCHIATRIC: The  patient is alert and oriented x 3.  SKIN: No obvious rash, lesion, or ulcer.    LABORATORY PANEL:   CBC Recent Labs  Lab 02/20/19 0459  WBC 11.0*  HGB 13.9  HCT 42.3  PLT 301   ------------------------------------------------------------------------------------------------------------------  Chemistries  Recent Labs  Lab 02/19/19 0707 02/20/19 0459  NA 136 138  K 3.5 4.0  CL 102 103  CO2 22 25  GLUCOSE 145* 97  BUN 25* 26*  CREATININE 1.16* 0.89  CALCIUM 8.9 9.2  AST 33  --   ALT 21  --   ALKPHOS 73  --   BILITOT 1.0  --    ------------------------------------------------------------------------------------------------------------------  Cardiac Enzymes No results for input(s): TROPONINI in the last 168 hours. ------------------------------------------------------------------------------------------------------------------  RADIOLOGY:  Dg Lumbar Spine Complete  Result Date: 02/19/2019 CLINICAL DATA:  Low back pain post fall EXAM: LUMBAR SPINE - COMPLETE 4+ VIEW COMPARISON:  08/03/2018 FINDINGS: Five non-rib-bearing lumbar vertebra. Osseous demineralization. Multilevel facet degenerative changes. Disc space narrowing at L4-L5 with grade 1 anterolisthesis. Additional disc space narrowing L5-S1. No fracture, additional subluxation, or bone destruction. No spondylolysis. Atherosclerotic calcifications of aorta and iliac arteries as well as splenic artery. IMPRESSION: Degenerative disc and facet disease changes of the lumbar spine, not significantly changed. Osseous demineralization. No acute mallet. Electronically Signed   By: Lavonia Dana M.D.   On: 02/19/2019 09:07   Ct Head Wo Contrast  Result Date: 02/19/2019 CLINICAL DATA:  Unwitnessed fall EXAM: CT HEAD WITHOUT CONTRAST CT CERVICAL SPINE WITHOUT CONTRAST TECHNIQUE: Multidetector CT imaging of the head and cervical spine was performed following the standard protocol without intravenous contrast. Multiplanar CT image  reconstructions of the cervical spine were also generated. COMPARISON:  None. FINDINGS: CT  HEAD FINDINGS Brain: No evidence of acute infarction, hemorrhage, hydrocephalus, extra-axial collection or mass lesion/mass effect. Extensive low-density changes within the periventricular and subcortical white matter compatible with chronic microvascular ischemic change. Mild-moderate diffuse cerebral volume loss. Vascular: Mild atherosclerotic calcifications involving the large vessels of the skull base. No unexpected hyperdense vessel. Skull: No skull fracture. Sinuses/Orbits: No acute finding. Other: None. CT CERVICAL SPINE FINDINGS Alignment: Reversal of the cervical lordosis. Grade 1 anterolisthesis C2 on C3, C3 on C4, and C7 on T1. Retrolisthesis C4 on C5. Skull base and vertebrae: No acute fracture. No primary bone lesion or focal pathologic process. Soft tissues and spinal canal: No prevertebral fluid or swelling. No visible canal hematoma. Disc levels: Severe multilevel degenerative changes most pronounced at C4-5, C5-6, and C6-7. Prominent posterior disc osteophyte complex at C4-5 likely results in high-grade canal stenosis. Bulky bilateral facet and uncovertebral arthropathy result in areas of severe foraminal stenosis, most pronounced at C4-5 on the left. Upper chest: Centrilobular emphysema within the lung apices. Carotid artery calcifications. Other: None. IMPRESSION: 1. No acute intracranial findings. 2. Severe multilevel degenerative changes of the cervical spine without acute fracture or dislocation. 3. Chronic microvascular ischemic disease and cerebral volume loss. 4. High-grade canal stenosis at the C4-5 level secondary to prominent chronic-appearing disc osteophyte complex and bulky left-sided facet arthropathy. Electronically Signed   By: Davina Poke M.D.   On: 02/19/2019 08:33   Ct Cervical Spine Wo Contrast  Result Date: 02/19/2019 CLINICAL DATA:  Unwitnessed fall EXAM: CT HEAD WITHOUT  CONTRAST CT CERVICAL SPINE WITHOUT CONTRAST TECHNIQUE: Multidetector CT imaging of the head and cervical spine was performed following the standard protocol without intravenous contrast. Multiplanar CT image reconstructions of the cervical spine were also generated. COMPARISON:  None. FINDINGS: CT HEAD FINDINGS Brain: No evidence of acute infarction, hemorrhage, hydrocephalus, extra-axial collection or mass lesion/mass effect. Extensive low-density changes within the periventricular and subcortical white matter compatible with chronic microvascular ischemic change. Mild-moderate diffuse cerebral volume loss. Vascular: Mild atherosclerotic calcifications involving the large vessels of the skull base. No unexpected hyperdense vessel. Skull: No skull fracture. Sinuses/Orbits: No acute finding. Other: None. CT CERVICAL SPINE FINDINGS Alignment: Reversal of the cervical lordosis. Grade 1 anterolisthesis C2 on C3, C3 on C4, and C7 on T1. Retrolisthesis C4 on C5. Skull base and vertebrae: No acute fracture. No primary bone lesion or focal pathologic process. Soft tissues and spinal canal: No prevertebral fluid or swelling. No visible canal hematoma. Disc levels: Severe multilevel degenerative changes most pronounced at C4-5, C5-6, and C6-7. Prominent posterior disc osteophyte complex at C4-5 likely results in high-grade canal stenosis. Bulky bilateral facet and uncovertebral arthropathy result in areas of severe foraminal stenosis, most pronounced at C4-5 on the left. Upper chest: Centrilobular emphysema within the lung apices. Carotid artery calcifications. Other: None. IMPRESSION: 1. No acute intracranial findings. 2. Severe multilevel degenerative changes of the cervical spine without acute fracture or dislocation. 3. Chronic microvascular ischemic disease and cerebral volume loss. 4. High-grade canal stenosis at the C4-5 level secondary to prominent chronic-appearing disc osteophyte complex and bulky left-sided facet  arthropathy. Electronically Signed   By: Davina Poke M.D.   On: 02/19/2019 08:33   Mr Hip Right Wo Contrast  Result Date: 02/19/2019 CLINICAL DATA:  Right hip pain after fall. EXAM: MR OF THE RIGHT HIP WITHOUT CONTRAST TECHNIQUE: Multiplanar, multisequence MR imaging was performed. No intravenous contrast was administered. COMPARISON:  Right hip x-rays from same day. Right hip MRI dated August 03, 2018 FINDINGS:  Bones: There is no evidence of acute fracture, dislocation or avascular necrosis. No focal bone lesion. The visualized sacroiliac joints and symphysis pubis appear normal. Articular cartilage and labrum Articular cartilage: Unchanged asymmetric right greater than left partial-thickness cartilage loss. No subchondral signal abnormality. Labrum: Grossly intact, although evaluation is limited due to lack of intra-articular fluid. No paralabral abnormality. Joint or bursal effusion Joint effusion: Unchanged small right hip joint effusion with synovitis. Bursae: No focal periarticular fluid collection. Muscles and tendons Muscles and tendons: The visualized gluteus, hamstring and iliopsoas tendons appear normal. No significant muscle edema or atrophy. Other findings Miscellaneous: Unchanged 1.1 cm ganglion cyst anterior to the right ischial tuberosity. Unchanged right paramedian abdominal wall hernia containing a portion of the transverse colon. Unchanged colonic diverticulosis. Unchanged fluid within the endometrial canal. IMPRESSION: 1.  No acute osseous abnormality. 2. Unchanged moderate right and mild left hip osteoarthritis. Electronically Signed   By: Titus Dubin M.D.   On: 02/19/2019 11:54   Dg Knee Complete 4 Views Right  Result Date: 02/19/2019 CLINICAL DATA:  Right knee pain post fall EXAM: RIGHT KNEE - COMPLETE 4+ VIEW COMPARISON:  None FINDINGS: Osseous demineralization. Joint space narrowing at medial compartment and probably patellofemoral joint. Tiny patellar spurs. No acute  fracture, dislocation, or bone destruction. No joint effusion. Atherosclerotic calcifications of the distal superficial femoral and popliteal arteries. IMPRESSION: Mild degenerative changes RIGHT knee. No acute abnormalities. Electronically Signed   By: Lavonia Dana M.D.   On: 02/19/2019 09:03   Dg Hip Unilat W Or Wo Pelvis 2-3 Views Right  Result Date: 02/19/2019 CLINICAL DATA:  RIGHT hip pain post fall EXAM: DG HIP (WITH OR WITHOUT PELVIS) 2-3V RIGHT COMPARISON:  08/03/2018 FINDINGS: Osseous demineralization. SI joints and LEFT hip joint space preserved. Mild joint space narrowing and spur formation at the RIGHT hip. No acute fracture, dislocation, or bone destruction. Scattered atherosclerotic calcifications. IMPRESSION: Osseous demineralization with degenerative changes of the RIGHT hip joint. No acute abnormalities. Electronically Signed   By: Lavonia Dana M.D.   On: 02/19/2019 09:06    EKG:   Orders placed or performed during the hospital encounter of 02/19/19  . EKG 12-Lead  . EKG 12-Lead  . EKG 12-Lead  . EKG 12-Lead  . ED EKG  . ED EKG    ASSESSMENT AND PLAN:  84 year old female patient with history of essential hypertension, hyperlipidemia compartment atrial fibrillation, rheumatoid arthritis, CKD stage III comes from home because of the fall, now has inability to move so admitting the patient for right hip pain with x-rays and MRI of the right hip negative for fractures now having having ambulatory difficulty.  1.  Atrial fibrillation with RVR,  Clinically improving Heart rate is running at around 100s   continue oral Cardizem, Eliquis, monitor on telemetry.  Titrate medications as needed  2.  Fall, right hip pain, ambulatory difficulty, x-rays and CT scan negative for fractures, continue pain medicines Call with test is negative  physical therapy consult.  Recommending skilled nursing facility and patient is agreeable CT head is negative  3.  Right hip pain is chronic  according to documentation from PCP note in March, pain medication as needed Continue physical therapy  4.  Generalized weakness PT has assessed the patient recommending skilled nursing facility and patient is agreeable  DVT prophylaxis patient is on Eliquis  Disposition skilled nursing facility follow-up with case manager All the records are reviewed and case discussed with Care Management/Social Workerr. Management plans discussed with the  patient, family and they are in agreement.  CODE STATUS: fc   TOTAL TIME TAKING CARE OF THIS PATIENT: 36 minutes.   POSSIBLE D/C IN 1-2  DAYS, DEPENDING ON CLINICAL CONDITION.  Note: This dictation was prepared with Dragon dictation along with smaller phrase technology. Any transcriptional errors that result from this process are unintentional.   Nicholes Mango M.D on 02/20/2019 at 11:48 AM  Between 7am to 6pm - Pager - [phone] After 6pm go to [email or web address] - password [email or web address] [facility]  [facility]  Office  [phone]  CC: Primary care physician; Derinda Late, MD

## 2019-02-20 NOTE — Plan of Care (Signed)
  Problem: Activity: Goal: Risk for activity intolerance will decrease Outcome: Progressing   Problem: Pain Managment: Goal: General experience of comfort will improve Outcome: Progressing   

## 2019-02-20 NOTE — Care Management Obs Status (Signed)
[place] NOTIFICATION   Patient Details  Name: Angela Leonard MRN: [id number] Date of Birth: 01-25-1935   Medicare Observation Status Notification Given:  Yes    Treysen Sudbeck A Cloys Vera, RN 02/20/2019, 12:54 PM

## 2019-02-21 ENCOUNTER — Observation Stay: Payer: Medicare Other

## 2019-02-21 DIAGNOSIS — Z7982 Long term (current) use of aspirin: Secondary | ICD-10-CM | POA: Diagnosis not present

## 2019-02-21 DIAGNOSIS — Z7901 Long term (current) use of anticoagulants: Secondary | ICD-10-CM | POA: Diagnosis not present

## 2019-02-21 DIAGNOSIS — E785 Hyperlipidemia, unspecified: Secondary | ICD-10-CM | POA: Diagnosis present

## 2019-02-21 DIAGNOSIS — M069 Rheumatoid arthritis, unspecified: Secondary | ICD-10-CM | POA: Diagnosis present

## 2019-02-21 DIAGNOSIS — I482 Chronic atrial fibrillation, unspecified: Secondary | ICD-10-CM | POA: Diagnosis present

## 2019-02-21 DIAGNOSIS — A4159 Other Gram-negative sepsis: Secondary | ICD-10-CM | POA: Diagnosis present

## 2019-02-21 DIAGNOSIS — Y92019 Unspecified place in single-family (private) house as the place of occurrence of the external cause: Secondary | ICD-10-CM | POA: Diagnosis not present

## 2019-02-21 DIAGNOSIS — Z20828 Contact with and (suspected) exposure to other viral communicable diseases: Secondary | ICD-10-CM | POA: Diagnosis present

## 2019-02-21 DIAGNOSIS — R531 Weakness: Secondary | ICD-10-CM | POA: Diagnosis present

## 2019-02-21 DIAGNOSIS — R262 Difficulty in walking, not elsewhere classified: Secondary | ICD-10-CM | POA: Diagnosis present

## 2019-02-21 DIAGNOSIS — I129 Hypertensive chronic kidney disease with stage 1 through stage 4 chronic kidney disease, or unspecified chronic kidney disease: Secondary | ICD-10-CM | POA: Diagnosis present

## 2019-02-21 DIAGNOSIS — M545 Low back pain: Secondary | ICD-10-CM | POA: Diagnosis present

## 2019-02-21 DIAGNOSIS — M25551 Pain in right hip: Secondary | ICD-10-CM | POA: Diagnosis present

## 2019-02-21 DIAGNOSIS — W19XXXA Unspecified fall, initial encounter: Secondary | ICD-10-CM | POA: Diagnosis present

## 2019-02-21 DIAGNOSIS — Z87891 Personal history of nicotine dependence: Secondary | ICD-10-CM | POA: Diagnosis not present

## 2019-02-21 DIAGNOSIS — A419 Sepsis, unspecified organism: Secondary | ICD-10-CM | POA: Diagnosis present

## 2019-02-21 DIAGNOSIS — R112 Nausea with vomiting, unspecified: Secondary | ICD-10-CM | POA: Diagnosis not present

## 2019-02-21 DIAGNOSIS — B961 Klebsiella pneumoniae [K. pneumoniae] as the cause of diseases classified elsewhere: Secondary | ICD-10-CM | POA: Diagnosis present

## 2019-02-21 DIAGNOSIS — Z79899 Other long term (current) drug therapy: Secondary | ICD-10-CM | POA: Diagnosis not present

## 2019-02-21 DIAGNOSIS — N183 Chronic kidney disease, stage 3 (moderate): Secondary | ICD-10-CM | POA: Diagnosis present

## 2019-02-21 DIAGNOSIS — N390 Urinary tract infection, site not specified: Secondary | ICD-10-CM | POA: Diagnosis present

## 2019-02-21 DIAGNOSIS — Z8249 Family history of ischemic heart disease and other diseases of the circulatory system: Secondary | ICD-10-CM | POA: Diagnosis not present

## 2019-02-21 DIAGNOSIS — Z820 Family history of epilepsy and other diseases of the nervous system: Secondary | ICD-10-CM | POA: Diagnosis not present

## 2019-02-21 DIAGNOSIS — G8929 Other chronic pain: Secondary | ICD-10-CM | POA: Diagnosis present

## 2019-02-21 LAB — LACTIC ACID, PLASMA
Lactic Acid, Venous: 1.1 mmol/L (ref 0.5–1.9)
Lactic Acid, Venous: 0.8 mmol/L (ref 0.5–1.9)

## 2019-02-21 LAB — PROCALCITONIN: Procalcitonin: <0.1 ng/mL

## 2019-02-21 LAB — GLUCOSE, CAPILLARY: Glucose-Capillary: 85 mg/dL (ref 70–99)

## 2019-02-21 MED ORDER — SODIUM CHLORIDE 0.9 % IV SOLN
1.0000 g | INTRAVENOUS | Status: AC
  Administered 2019-02-21: 1 g via INTRAVENOUS
  Filled 2019-02-21: qty 10

## 2019-02-21 NOTE — Plan of Care (Signed)
  Problem: Clinical Measurements: Goal: Ability to maintain clinical measurements within normal limits will improve Outcome: Progressing   Problem: Clinical Measurements: Goal: Respiratory complications will improve Outcome: Progressing   Problem: Clinical Measurements: Goal: Cardiovascular complication will be avoided Outcome: Progressing   Problem: Pain Managment: Goal: General experience of comfort will improve Outcome: Progressing   Problem: Safety: Goal: Ability to remain free from injury will improve Outcome: Progressing   Problem: Skin Integrity: Goal: Risk for impaired skin integrity will decrease Outcome: Progressing

## 2019-02-21 NOTE — Progress Notes (Addendum)
[place] at [place] NAME: Angela Leonard    MR#:  [id number]  DATE OF BIRTH:  1934-11-04  SUBJECTIVE:  CHIEF COMPLAINT: Patient is feeling weak and tired with nausea had one episode of vomiting yesterday.  Reporting lower abdominal discomfort in the bladder area, came to the hospital with tachycardia, denies any chest pain or palpitations   REVIEW OF SYSTEMS:  CONSTITUTIONAL: No fever, reports fatigue or weakness.  EYES: No blurred or double vision.  EARS, NOSE, AND THROAT: No tinnitus or ear pain.  RESPIRATORY: No cough, shortness of breath, wheezing or hemoptysis.  CARDIOVASCULAR: No chest pain, orthopnea, edema.  GASTROINTESTINAL: HAVING NAUSEA AND HAD ONE EPISODE OF VOMITING .  Reporting lower abdominal discomfort  GENITOURINARY: No dysuria, hematuria.  ENDOCRINE: No polyuria, nocturia,  HEMATOLOGY: No anemia, easy bruising or bleeding SKIN: No rash or lesion. MUSCULOSKELETAL: No joint pain or arthritis.   NEUROLOGIC: No tingling, numbness, weakness.  PSYCHIATRY: No anxiety or depression.   DRUG ALLERGIES:  No Known Allergies  VITALS:  Blood pressure (!) 135/93, pulse 89, temperature 98 F (36.7 C), temperature source Oral, resp. rate 18, height 5' 5"  (1.651 m), weight 72.1 kg, SpO2 92 %.  PHYSICAL EXAMINATION:  GENERAL:  84 y.o.-year-old patient lying in the bed with no acute distress.  EYES: Pupils equal, round, reactive to light and accommodation. No scleral icterus. Extraocular muscles intact.  HEENT: Head atraumatic, normocephalic. Oropharynx and nasopharynx clear.  NECK:  Supple, no jugular venous distention. No thyroid enlargement, no tenderness.  LUNGS: Normal breath sounds bilaterally, no wheezing, rales,rhonchi or crepitation. No use of accessory muscles of respiration.  CARDIOVASCULAR: Irregularly irregular no murmurs, rubs, or gallops.  ABDOMEN: Soft, nontender, nondistended. Bowel sounds present.  EXTREMITIES:  No pedal edema, cyanosis, or clubbing.  NEUROLOGIC: Cranial nerves II through XII are intact. Muscle strength weak in all extremities. Sensation intact. Gait not checked.  PSYCHIATRIC: The patient is alert and oriented x 3.  SKIN: No obvious rash, lesion, or ulcer.    LABORATORY PANEL:   CBC Recent Labs  Lab 02/20/19 0459  WBC 11.0*  HGB 13.9  HCT 42.3  PLT 301   ------------------------------------------------------------------------------------------------------------------  Chemistries  Recent Labs  Lab 02/19/19 0707 02/20/19 0459  NA 136 138  K 3.5 4.0  CL 102 103  CO2 22 25  GLUCOSE 145* 97  BUN 25* 26*  CREATININE 1.16* 0.89  CALCIUM 8.9 9.2  AST 33  --   ALT 21  --   ALKPHOS 73  --   BILITOT 1.0  --    ------------------------------------------------------------------------------------------------------------------  Cardiac Enzymes No results for input(s): TROPONINI in the last 168 hours. ------------------------------------------------------------------------------------------------------------------  RADIOLOGY:  Mr Hip Right Wo Contrast  Result Date: 02/19/2019 CLINICAL DATA:  Right hip pain after fall. EXAM: MR OF THE RIGHT HIP WITHOUT CONTRAST TECHNIQUE: Multiplanar, multisequence MR imaging was performed. No intravenous contrast was administered. COMPARISON:  Right hip x-rays from same day. Right hip MRI dated August 03, 2018 FINDINGS: Bones: There is no evidence of acute fracture, dislocation or avascular necrosis. No focal bone lesion. The visualized sacroiliac joints and symphysis pubis appear normal. Articular cartilage and labrum Articular cartilage: Unchanged asymmetric right greater than left partial-thickness cartilage loss. No subchondral signal abnormality. Labrum: Grossly intact, although evaluation is limited due to lack of intra-articular fluid. No paralabral abnormality. Joint or bursal effusion Joint effusion: Unchanged small right hip joint  effusion with synovitis. Bursae: No focal periarticular fluid collection. Muscles and  tendons Muscles and tendons: The visualized gluteus, hamstring and iliopsoas tendons appear normal. No significant muscle edema or atrophy. Other findings Miscellaneous: Unchanged 1.1 cm ganglion cyst anterior to the right ischial tuberosity. Unchanged right paramedian abdominal wall hernia containing a portion of the transverse colon. Unchanged colonic diverticulosis. Unchanged fluid within the endometrial canal. IMPRESSION: 1.  No acute osseous abnormality. 2. Unchanged moderate right and mild left hip osteoarthritis. Electronically Signed   By: Titus Dubin M.D.   On: 02/19/2019 11:54    EKG:   Orders placed or performed during the hospital encounter of 02/19/19  . EKG 12-Lead  . EKG 12-Lead  . EKG 12-Lead  . EKG 12-Lead  . ED EKG  . ED EKG    ASSESSMENT AND PLAN:  84 year old female patient with history of essential hypertension, hyperlipidemia compartment atrial fibrillation, rheumatoid arthritis, CKD stage III comes from home because of the fall, now has inability to move so admitting the patient for right hip pain with x-rays and MRI of the right hip negative for fractures now having having ambulatory difficulty.  #Sepsis present at the time of admission patient met septic criteria with tachycardia and leukocytosis Abnormal urinalysis We will get urine culture and sensitivity,Get blood cultures x2 cxr One-time dose of IV Rocephin today, based on the culture and sensitivity results rounding physician can decide on the further continuation of antibiotics Will get lactic acid, procalcitonin Continue IV fluids Check a.m. labs  #.  Atrial fibrillation with RVR,  Clinically improving Heart rate is running at around 95 -  100s   continue oral Cardizem, Eliquis, monitor on telemetry.  Titrate medications as needed  #.  Fall, right hip pain, ambulatory difficulty, x-rays and CT scan negative for  fractures, continue pain medicines Call with test is negative  physical therapy consult.  Recommending skilled nursing facility and patient is agreeable CT head is negative  #.  Right hip pain is chronic according to documentation from PCP note in March  pain medication as needed  physical therapy is recommending skilled nursing facility, case manager is following  #.  Generalized weakness PT has assessed the patient recommending skilled nursing facility and patient is agreeable  DVT prophylaxis patient is on Eliquis  Disposition skilled nursing facility follow-up with case manager All the records are reviewed and case discussed with Care Management/Social Workerr. Management plans discussed with the patient, family and they are in agreement.  CODE STATUS: fc   TOTAL TIME TAKING CARE OF THIS PATIENT: 36 minutes.   POSSIBLE D/C IN 1-2  DAYS, DEPENDING ON CLINICAL CONDITION.  Note: This dictation was prepared with Dragon dictation along with smaller phrase technology. Any transcriptional errors that result from this process are unintentional.   Nicholes Mango M.D on 02/21/2019 at 8:56 AM  Between 7am to 6pm - Pager - [phone] After 6pm go to [email or web address] - password [email or web address] [facility]  [place] [facility]  Office  [phone]  CC: Primary care physician; Derinda Late, MD

## 2019-02-21 NOTE — NC FL2 (Signed)
[place] LEVEL OF CARE SCREENING TOOL     IDENTIFICATION  Patient Name: Angela Leonard Birthdate: 05-Nov-1934 Sex: female Admission Date (Current Location): 02/19/2019  [place] and [place] Number:  Engineering geologist and Address:  [facility], [address] [place], [place] [address]      Provider Number: [id number]  Attending Physician Name and Address:  Nicholes Mango, MD  Relative Name and Phone Number:  Kilts,Jennifer Daughter [phone]  [phone]    Current Level of Care: Hospital Recommended Level of Care: [place] Prior Approval Number:    Date Approved/Denied:   PASRR Number: [id number]  Discharge Plan: SNF    Current Diagnoses: Patient Active Problem List   Diagnosis Date Noted  . Afib ([place]) 02/19/2019  . Sepsis ([place]) 08/03/2018  . Atypical pneumonia 08/03/2018  . Arthritis of right hip 08/03/2018  . HTN (hypertension) 08/03/2018  . HLD (hyperlipidemia) 08/03/2018  . AKI (acute kidney injury) ([place]) 09/15/2017  . Symptomatic cholelithiasis 10/13/2014    Orientation RESPIRATION BLADDER Height & Weight     Self, Time, Situation, Place  Normal Continent Weight: 72.1 kg Height:  5\' 5"  (165.1 cm)  BEHAVIORAL SYMPTOMS/MOOD NEUROLOGICAL BOWEL NUTRITION STATUS      Continent    AMBULATORY STATUS COMMUNICATION OF NEEDS Skin   Limited Assist Verbally Skin abrasions, Bruising                       Personal Care Assistance Level of Assistance  Bathing, Dressing, Feeding, Total care Bathing Assistance: Limited assistance Feeding assistance: Independent Dressing Assistance: Limited assistance Total Care Assistance: Limited assistance   Functional Limitations Info  Sight, Speech, Hearing Sight Info: Adequate Hearing Info: Impaired(uses hearing aids) Speech Info: Adequate    SPECIAL CARE FACTORS FREQUENCY  PT (By licensed PT), OT (By licensed OT)     PT Frequency: 5x per week OT  Frequency: 5x per week            Contractures Contractures Info: Not present    Additional Factors Info  Code Status, Allergies Code Status Info: full code Allergies Info: none           Current Medications (02/21/2019):  This is the current hospital active medication list Current Facility-Administered Medications  Medication Dose Route Frequency Provider Last Rate Last Dose  . 0.9 %  sodium chloride infusion   Intravenous Continuous Epifanio Lesches, MD 75 mL/hr at 02/21/19 0640    . acetaminophen (TYLENOL) tablet 650 mg  650 mg Oral Q6H PRN Epifanio Lesches, MD   650 mg at 02/21/19 1011   Or  . acetaminophen (TYLENOL) suppository 650 mg  650 mg Rectal Q6H PRN Epifanio Lesches, MD      . apixaban (ELIQUIS) tablet 5 mg  5 mg Oral Q12H Epifanio Lesches, MD   5 mg at 02/21/19 1008  . bisacodyl (DULCOLAX) EC tablet 5 mg  5 mg Oral Daily PRN Epifanio Lesches, MD      . cholecalciferol (VITAMIN D) tablet 1,000 Units  1,000 Units Oral Daily Epifanio Lesches, MD   1,000 Units at 02/21/19 1008  . diltiazem (CARDIZEM CD) 24 hr capsule 120 mg  120 mg Oral Daily Epifanio Lesches, MD   120 mg at 02/21/19 1008  . folic acid (FOLVITE) tablet 1 mg  1 mg Oral Daily Epifanio Lesches, MD   1 mg at 02/21/19 1007  . [START ON 02/23/2019] methotrexate (RHEUMATREX) tablet 15 mg  15 mg Oral Q Tue  Epifanio Lesches, MD      . multivitamin with minerals tablet 1 tablet  1 tablet Oral Daily Epifanio Lesches, MD   1 tablet at 02/21/19 1007  . ondansetron (ZOFRAN) tablet 4 mg  4 mg Oral Q6H PRN Epifanio Lesches, MD       Or  . ondansetron (ZOFRAN) injection 4 mg  4 mg Intravenous Q6H PRN Epifanio Lesches, MD   4 mg at 02/20/19 1738  . pantoprazole (PROTONIX) EC tablet 40 mg  40 mg Oral Daily Epifanio Lesches, MD   40 mg at 02/21/19 1008  . ramipril (ALTACE) capsule 10 mg  10 mg Oral Daily Epifanio Lesches, MD   10 mg at 02/21/19 1008  . traZODone  (DESYREL) tablet 25 mg  25 mg Oral QHS PRN Epifanio Lesches, MD   25 mg at 02/19/19 2148  . vitamin B-12 (CYANOCOBALAMIN) tablet 1,000 mcg  1,000 mcg Oral Daily Epifanio Lesches, MD   1,000 mcg at 02/21/19 1008     Discharge Medications: Please see discharge summary for a list of discharge medications.  Relevant Imaging Results:  Relevant Lab Results:   Additional Information ss [id number]  Latanya Maudlin, RN

## 2019-02-21 NOTE — Plan of Care (Signed)
Pt complaining of lower abdominal discomfort over the bladder; urine concentrated and amber; U/A positive for nitrates; WBC elevated.  Discussed with hospitalist.  Problem: Education: Goal: Knowledge of General Education information will improve Description: Including pain rating scale, medication(s)/side effects and non-pharmacologic comfort measures Outcome: Progressing   Problem: Health Behavior/Discharge Planning: Goal: Ability to manage health-related needs will improve Outcome: Progressing   Problem: Clinical Measurements: Goal: Ability to maintain clinical measurements within normal limits will improve Outcome: Progressing Goal: Will remain free from infection Outcome: Progressing Goal: Diagnostic test results will improve Outcome: Progressing Goal: Respiratory complications will improve Outcome: Progressing Goal: Cardiovascular complication will be avoided Outcome: Progressing   Problem: Activity: Goal: Risk for activity intolerance will decrease Outcome: Progressing   Problem: Nutrition: Goal: Adequate nutrition will be maintained Outcome: Progressing   Problem: Coping: Goal: Level of anxiety will decrease Outcome: Progressing   Problem: Elimination: Goal: Will not experience complications related to bowel motility Outcome: Progressing Goal: Will not experience complications related to urinary retention Outcome: Progressing   Problem: Pain Managment: Goal: General experience of comfort will improve Outcome: Progressing   Problem: Safety: Goal: Ability to remain free from injury will improve Outcome: Progressing   Problem: Skin Integrity: Goal: Risk for impaired skin integrity will decrease Outcome: Progressing

## 2019-02-21 NOTE — TOC Transition Note (Addendum)
Transition of Care [facility]) - CM/SW Discharge Note   Patient Details  Name: Angela Leonard MRN: [id number] Date of Birth: 13-Jun-1934  Transition of Care [facility]) CM/SW Contact:  Latanya Maudlin, RN Phone Number: 02/21/2019, 10:50 AM   Clinical Narrative: [facility] team consulted as PT is recommending SNF. Patient currently lives with her daughter. Patient is confused intermittently. Spoke to patients daughter over the phone. Unfortunately, since patient is observation status she can not seek SNF. Daughter agreeable to home health and understands medicare regulation for SNF placement. Daughter tells me they have a wheelchair and walker in the home. Patient will have to use EMS for transport and she cannot place much weight right now without significant pain per MD. CMS Medicare.gov Compare Post Acute Care list reviewed with daughter and they have no preference of agency. Referral placed with Corene Cornea at [facility] care. There are currently no discharge orders but TOC team will continue to follow for home health orders and other disposition needs.   Update 14:40- PT continues to recommend SNF and patient has been converted to inpatient status. Patient will require three night hospitalization starting tonight to qualify for STR. FL2 done and offers sent out through the [place].    Final next level of care: Home w Home Health Services Barriers to Discharge: No Barriers Identified   Patient Goals and CMS Choice   CMS Medicare.gov Compare Post Acute Care list provided to:: Patient Choice offered to / list presented to : Patient, Adult Children  Discharge Placement                       Discharge Plan and Services     Post Acute Care Choice: Home Health                    HH Arranged: RN, PT, Nurse's Aide [facility] Agency: [place] ([place]) Date [facility] Agency Contacted: 02/21/19 Time [place]: 1050 Representative spoke with at [place]: [place]  ([place]) Interventions     Readmission Risk Interventions No flowsheet data found.

## 2019-02-22 LAB — BASIC METABOLIC PANEL
Anion gap: 6 (ref 5–15)
BUN: 20 mg/dL (ref 8–23)
CO2: 25 mmol/L (ref 22–32)
Calcium: 8.5 mg/dL — ABNORMAL LOW (ref 8.9–10.3)
Chloride: 105 mmol/L (ref 98–111)
Creatinine, Ser: 0.87 mg/dL (ref 0.44–1.00)
GFR calc Af Amer: >60 mL/min (ref >60)
GFR calc non Af Amer: >60 mL/min (ref >60)
Glucose, Bld: 102 mg/dL — ABNORMAL HIGH (ref 70–99)
Potassium: 4.1 mmol/L (ref 3.5–5.1)
Sodium: 136 mmol/L (ref 135–145)

## 2019-02-22 LAB — CBC WITH DIFFERENTIAL/PLATELET
Abs Immature Granulocytes: 0.11 10*3/uL — ABNORMAL HIGH (ref 0.00–0.07)
Basophils Absolute: 0 10*3/uL (ref 0.0–0.1)
Basophils Relative: 0 %
Eosinophils Absolute: 0.2 10*3/uL (ref 0.0–0.5)
Eosinophils Relative: 2 %
HCT: 40.9 % (ref 36.0–46.0)
Hemoglobin: 13.4 g/dL (ref 12.0–15.0)
Immature Granulocytes: 1 %
Lymphocytes Relative: 16 %
Lymphs Abs: 1.2 10*3/uL (ref 0.7–4.0)
MCH: 33.7 pg (ref 26.0–34.0)
MCHC: 32.8 g/dL (ref 30.0–36.0)
MCV: 102.8 fL — ABNORMAL HIGH (ref 80.0–100.0)
Monocytes Absolute: 0.6 10*3/uL (ref 0.1–1.0)
Monocytes Relative: 7 %
Neutro Abs: 5.8 10*3/uL (ref 1.7–7.7)
Neutrophils Relative %: 74 %
Platelets: 283 10*3/uL (ref 150–400)
RBC: 3.98 MIL/uL (ref 3.87–5.11)
RDW: 14.3 % (ref 11.5–15.5)
WBC: 7.9 10*3/uL (ref 4.0–10.5)
nRBC: 0 % (ref 0.0–0.2)

## 2019-02-22 LAB — PROCALCITONIN: Procalcitonin: <0.1 ng/mL

## 2019-02-22 LAB — GLUCOSE, CAPILLARY: Glucose-Capillary: 100 mg/dL — ABNORMAL HIGH (ref 70–99)

## 2019-02-22 MED ORDER — SENNOSIDES-DOCUSATE SODIUM 8.6-50 MG PO TABS
2.0000 | ORAL_TABLET | Freq: Two times a day (BID) | ORAL | Status: DC
  Administered 2019-02-22: 2 via ORAL
  Filled 2019-02-22: qty 2

## 2019-02-22 MED ORDER — DILTIAZEM HCL 25 MG/5ML IV SOLN
5.0000 mg | Freq: Once | INTRAVENOUS | Status: AC
  Filled 2019-02-22: qty 5

## 2019-02-22 MED ORDER — DILTIAZEM HCL 25 MG/5ML IV SOLN
5.0000 mg | INTRAVENOUS | Status: AC
  Administered 2019-02-22: 5 mg via INTRAVENOUS

## 2019-02-22 NOTE — TOC Transition Note (Addendum)
Transition of Care [facility]) - CM/SW Discharge Note   Patient Details  Name: Hollee Fate MRN: [id number] Date of Birth: 06-18-1934  Transition of Care [facility]) CM/SW Contact:  Ross Ludwig, LCSW Phone Number: 02/22/2019, 12:33 PM   Clinical Narrative:     CSW spoke with patient's daughter [name] [phone], they would like patient to go home with home health.  [facility] contacted [facility], and [facility] can accept patient.  Patient will also need palliative to follow.  CSW made referral to [place] representative Santiago Glad.  Daughter is requesting EMS transport.  CSW spoke to physician and he agreed that due to patient's high risk for readmission he will order [facility]'s two week protocol.  CSW made [place] aware of change in home health orders.   Final next level of care: [place] Barriers to Discharge: Barriers Resolved   Patient Goals and CMS Choice Patient states their goals for this hospitalization and ongoing recovery are:: To return back home with home health PT, OT, RN, aide, and social worker. CMS Medicare.gov Compare Post Acute Care list provided to:: Patient Represenative (must comment) Choice offered to / list presented to : Adult Children  Discharge Placement    Discharge home with home health.                   Discharge Plan and Services     Post Acute Care Choice: Home Health          DME Arranged: N/A         HH Arranged: RN, PT, OT, Nurse's Aide, Social Work [employer] Agency: [place] ([place]) Date [place]: 02/22/19 Time [place]: 1232 Representative spoke with at [place]: [place] ([place]) Interventions     Readmission Risk Interventions No flowsheet data found.

## 2019-02-22 NOTE — Discharge Instructions (Signed)
Atrial Fibrillation ° °Atrial fibrillation is a type of heartbeat that is irregular or fast (rapid). If you have this condition, your heart beats without any order. This makes it hard for your heart to pump blood in a normal way. Having this condition gives you more risk for stroke, heart failure, and other heart problems. °Atrial fibrillation may start all of a sudden and then stop on its own, or it may become a long-lasting problem. °What are the causes? °This condition may be caused by heart conditions, such as: °· High blood pressure. °· Heart failure. °· Heart valve disease. °· Heart surgery. °Other causes include: °· Pneumonia. °· Obstructive sleep apnea. °· Lung cancer. °· Thyroid disease. °· Drinking too much alcohol. °Sometimes the cause is not known. °What increases the risk? °You are more likely to develop this condition if: °· You smoke. °· You are older. °· You have diabetes. °· You are overweight. °· You have a family history of this condition. °· You exercise often and hard. °What are the signs or symptoms? °Common symptoms of this condition include: °· A feeling like your heart is beating very fast. °· Chest pain. °· Feeling short of breath. °· Feeling light-headed or weak. °· Getting tired easily. °Follow these instructions at home: °Medicines °· Take over-the-counter and prescription medicines only as told by your doctor. °· If your doctor gives you a blood-thinning medicine, take it exactly as told. Taking too much of it can cause bleeding. Taking too little of it does not protect you against clots. Clots can cause a stroke. °Lifestyle ° °  ° °· Do not use any tobacco products. These include cigarettes, chewing tobacco, and e-cigarettes. If you need help quitting, ask your doctor. °· Do not drink alcohol. °· Do not drink beverages that have caffeine. These include coffee, soda, and tea. °· Follow diet instructions as told by your doctor. °· Exercise regularly as told by your doctor. °General  instructions °· If you have a condition that causes breathing to stop for a short period of time (apnea), treat it as told by your doctor. °· Keep a healthy weight. Do not use diet pills unless your doctor says they are safe for you. Diet pills may make heart problems worse. °· Keep all follow-up visits as told by your doctor. This is important. °Contact a doctor if: °· You notice a change in the speed, rhythm, or strength of your heartbeat. °· You are taking a blood-thinning medicine and you see more bruising. °· You get tired more easily when you move or exercise. °· You have a sudden change in weight. °Get help right away if: ° °· You have pain in your chest or your belly (abdomen). °· You have trouble breathing. °· You have blood in your vomit, poop, or pee (urine). °· You have any signs of a stroke. "BE FAST" is an easy way to remember the main warning signs: °? B - Balance. Signs are dizziness, sudden trouble walking, or loss of balance. °? E - Eyes. Signs are trouble seeing or a change in how you see. °? F - Face. Signs are sudden weakness or loss of feeling in the face, or the face or eyelid drooping on one side. °? A - Arms. Signs are weakness or loss of feeling in an arm. This happens suddenly and usually on one side of the body. °? S - Speech. Signs are sudden trouble speaking, slurred speech, or trouble understanding what people say. °? T - Time.   Time to call emergency services. Write down what time symptoms started. °· You have other signs of a stroke, such as: °? A sudden, very bad headache with no known cause. °? Feeling sick to your stomach (nausea). °? Throwing up (vomiting). °? Jerky movements you cannot control (seizure). °These symptoms may be an emergency. Do not wait to see if the symptoms will go away. Get medical help right away. Call your local emergency services (911 in the [place] Do not drive yourself to the hospital. °Summary °· Atrial fibrillation is a type of heartbeat that is irregular  or fast (rapid). °· You are at higher risk of this condition if you smoke, are older, have diabetes, or are overweight. °· Follow your doctor's instructions about medicines, diet, exercise, and follow-up visits. °· Get help right away if you think that you have signs of a stroke. °This information is not intended to replace advice given to you by your health care provider. Make sure you discuss any questions you have with your health care provider. °Document Released: 02/20/2008 Document Revised: 07/17/2017 Document Reviewed: 07/04/2017 °Elsevier Patient Education © 2020 Elsevier Inc. ° °

## 2019-02-22 NOTE — Progress Notes (Signed)
HR returned to 90's-low 100's post IV cardizem, MD aware, orders given to proceed with d/c. IV and tele removed from patient. Discharge instructions given to daughter [name] via telephone and placed in discharge packet. No acute distress at this time. EMS called for transportation.

## 2019-02-22 NOTE — TOC Transition Note (Deleted)
Transition of Care [facility]) - CM/SW Discharge Note   Patient Details  Name: Angela Leonard MRN: [id number] Date of Birth: April 22, 1935  Transition of Care [facility]) CM/SW Contact:  Ross Ludwig, LCSW Phone Number: 02/22/2019, 12:30 PM   Clinical Narrative:     CSW spoke with patient's daughter [name] [phone], they would like patient to go home with home health.  [facility] contacted [facility], and [facility] can accept patient.  Patient will also need palliative to follow.  CSW made referral to [place] representative Santiago Glad.  Daughter is requesting EMS transport.    Final next level of care: Home w Home Health Services Barriers to Discharge: No Barriers Identified   Patient Goals and CMS Choice   CMS Medicare.gov Compare Post Acute Care list provided to:: Patient Choice offered to / list presented to : Patient, Adult Children  Discharge Placement                       Discharge Plan and Services     Post Acute Care Choice: Home Health                    HH Arranged: RN, PT, Nurse's Aide [facility] Agency: [place] ([place]) Date [facility] Agency Contacted: 02/21/19 Time [place]: 1050 Representative spoke with at [place]: [place] ([place]) Interventions     Readmission Risk Interventions No flowsheet data found.

## 2019-02-22 NOTE — NC FL2 (Addendum)
[place] LEVEL OF CARE SCREENING TOOL     IDENTIFICATION  Patient Name: Angela Leonard Birthdate: 30-Aug-1934 Sex: female Admission Date (Current Location): 02/19/2019  [place] and [place] Number:  Engineering geologist and Address:  [facility], [address], [place], [place] [address]      Provider Number: [id number]  Attending Physician Name and Address:  Max Sane, MD  Relative Name and Phone Number:  Hensch,Jennifer Daughter [phone]  [phone]    Current Level of Care: Hospital Recommended Level of Care: [place] Prior Approval Number:    Date Approved/Denied:   PASRR Number: [id number]  Discharge Plan: SNF    Current Diagnoses: Patient Active Problem List   Diagnosis Date Noted  . Afib ([place]) 02/19/2019  . Sepsis ([place]) 08/03/2018  . Atypical pneumonia 08/03/2018  . Arthritis of right hip 08/03/2018  . HTN (hypertension) 08/03/2018  . HLD (hyperlipidemia) 08/03/2018  . AKI (acute kidney injury) ([place]) 09/15/2017  . Symptomatic cholelithiasis 10/13/2014    Orientation RESPIRATION BLADDER Height & Weight     Self, Place  Normal Continent Weight: 154 lb (69.9 kg) Height:  5\' 5"  (165.1 cm)  BEHAVIORAL SYMPTOMS/MOOD NEUROLOGICAL BOWEL NUTRITION STATUS      Continent Diet(Cardiac)  AMBULATORY STATUS COMMUNICATION OF NEEDS Skin   Limited Assist Verbally Normal                       Personal Care Assistance Level of Assistance  Bathing, Feeding, Dressing Bathing Assistance: Limited assistance Feeding assistance: Independent Dressing Assistance: Limited assistance Total Care Assistance: Limited assistance   Functional Limitations Info  Sight, Hearing, Speech Sight Info: Adequate Hearing Info: Adequate Speech Info: Adequate    SPECIAL CARE FACTORS FREQUENCY  PT (By licensed PT), OT (By licensed OT)     PT Frequency: Minimum 5x a week OT Frequency: Minimum 5x a week             Contractures Contractures Info: Not present    Additional Factors Info  Code Status, Allergies Code Status Info: Full Code Allergies Info: No Known Allergies           Current Medications (02/22/2019):  This is the current hospital active medication list Current Facility-Administered Medications  Medication Dose Route Frequency Provider Last Rate Last Dose  . 0.9 %  sodium chloride infusion   Intravenous Continuous Epifanio Lesches, MD 75 mL/hr at 02/22/19 1300    . acetaminophen (TYLENOL) tablet 650 mg  650 mg Oral Q6H PRN Epifanio Lesches, MD   650 mg at 02/21/19 1011   Or  . acetaminophen (TYLENOL) suppository 650 mg  650 mg Rectal Q6H PRN Epifanio Lesches, MD      . apixaban (ELIQUIS) tablet 5 mg  5 mg Oral Q12H Epifanio Lesches, MD   5 mg at 02/22/19 1029  . bisacodyl (DULCOLAX) EC tablet 5 mg  5 mg Oral Daily PRN Epifanio Lesches, MD      . cholecalciferol (VITAMIN D) tablet 1,000 Units  1,000 Units Oral Daily Epifanio Lesches, MD   1,000 Units at 02/22/19 1029  . diltiazem (CARDIZEM CD) 24 hr capsule 120 mg  120 mg Oral Daily Epifanio Lesches, MD   120 mg at 72/09/47 0962  . folic acid (FOLVITE) tablet 1 mg  1 mg Oral Daily Epifanio Lesches, MD   1 mg at 02/22/19 1029  . [START ON 02/23/2019] methotrexate (RHEUMATREX) tablet 15 mg  15 mg Oral Q Gerrit Heck, MD      .  multivitamin with minerals tablet 1 tablet  1 tablet Oral Daily Epifanio Lesches, MD   1 tablet at 02/22/19 1029  . ondansetron (ZOFRAN) tablet 4 mg  4 mg Oral Q6H PRN Epifanio Lesches, MD       Or  . ondansetron (ZOFRAN) injection 4 mg  4 mg Intravenous Q6H PRN Epifanio Lesches, MD   4 mg at 02/20/19 1738  . pantoprazole (PROTONIX) EC tablet 40 mg  40 mg Oral Daily Epifanio Lesches, MD   40 mg at 02/22/19 1029  . ramipril (ALTACE) capsule 10 mg  10 mg Oral Daily Epifanio Lesches, MD   10 mg at 02/22/19 1029  . senna-docusate (Senokot-S) tablet 2  tablet  2 tablet Oral BID Max Sane, MD   2 tablet at 02/22/19 1029  . traZODone (DESYREL) tablet 25 mg  25 mg Oral QHS PRN Epifanio Lesches, MD   25 mg at 02/19/19 2148  . vitamin B-12 (CYANOCOBALAMIN) tablet 1,000 mcg  1,000 mcg Oral Daily Epifanio Lesches, MD   1,000 mcg at 02/22/19 1029     Discharge Medications: Please see discharge summary for a list of discharge medications.  Relevant Imaging Results:  Relevant Lab Results:   Additional Information SSN [id number]  Ross Ludwig, LCSW

## 2019-02-22 NOTE — Progress Notes (Signed)
New referral for outpatient Palliative to follow at home received from [place]. Plan is for discharge home today with [facility].  Flo Shanks BSN, RN, [place] [phone]

## 2019-02-22 NOTE — Progress Notes (Signed)
During PT patient's HR 150's afib RVR. At rest patient's HR decreased to 130's. Dr. Manuella Ghazi notified and ordered to give 5 mg IV cardizem. Cardizem given. Report given to [place], RN to resume care. Angela Leonard

## 2019-02-22 NOTE — Discharge Summary (Signed)
[facility] [place] NAME: Angela Leonard    MR#:  [id number]  DATE OF BIRTH:  1935/01/21  DATE OF ADMISSION:  02/19/2019   ADMITTING PHYSICIAN: Epifanio Lesches, MD  DATE OF DISCHARGE: 02/22/2019  4:16 PM  PRIMARY CARE PHYSICIAN: Derinda Late, MD   ADMISSION DIAGNOSIS:  Right hip pain [M25.551] Fall, initial encounter [W19.XXXA] DISCHARGE DIAGNOSIS:  Active Problems:   Sepsis ([place])   Afib ([place])  SECONDARY DIAGNOSIS:   Past Medical History:  Diagnosis Date  . HLD (hyperlipidemia)   . Hypertension    HOSPITAL COURSE:  84 year old female patient with history of essential hypertension, hyperlipidemia compartment atrial fibrillation, rheumatoid arthritis, CKD stage III comes from home because of the fall, now has inability to move so admitting the patient for right hip pain with x-rays and MRI of the right hip negative for fractures now having having ambulatory difficulty.  #Sepsis present at the time of admission due to klebsiella UTI - treated  #.  Atrial fibrillation with RVR,  HR labile especially on minimal exertion.  continue oral Cardizem, Eliquis  #. Fall, right hip pain, ambulatory difficulty, x-rays and CT scan negative for fractures, continue pain medicines Call with test is negative  physical therapy consult.  Recommending skilled nursing facility and patient is agreeable CT head is negative  #. Right hip pain is chronic according to documentation from PCP note in March  pain medication as needed - outpt Ortho f/up with [facility] ortho  #.  Generalized weakness PT has assessed the patient recommending skilled nursing facility but patient and daughter refused. They prefer to go home for now DISCHARGE CONDITIONS:  fair CONSULTS OBTAINED:   DRUG ALLERGIES:  No Known Allergies DISCHARGE MEDICATIONS:   Allergies as of 02/22/2019   No Known Allergies     Medication List    STOP taking these medications    oxyCODONE 5 MG immediate release tablet Commonly known as: Oxy IR/ROXICODONE     TAKE these medications   aspirin EC 81 MG tablet Take 81 mg by mouth at bedtime.   cholecalciferol 1000 units tablet Commonly known as: VITAMIN D Take 1,000 Units by mouth daily.   diltiazem 120 MG 24 hr capsule Commonly known as: TIAZAC Take 120 mg by mouth daily.   Eliquis 5 MG Tabs tablet Generic drug: apixaban Take 5 mg by mouth every 12 (twelve) hours.   folic acid 1 MG tablet Commonly known as: FOLVITE Take 1 mg by mouth daily.   methotrexate 2.5 MG tablet Commonly known as: RHEUMATREX Take 6 tablets by mouth every Tuesday.   multivitamin with minerals Tabs tablet Take 1 tablet by mouth daily.   ramipril 10 MG capsule Commonly known as: ALTACE Take 10 mg by mouth daily.   simvastatin 20 MG tablet Commonly known as: ZOCOR Take 20 mg by mouth at bedtime.   vitamin B-12 1000 MCG tablet Commonly known as: CYANOCOBALAMIN Take 1,000 mcg by mouth daily.        DISCHARGE INSTRUCTIONS:   DIET:  Cardiac diet DISCHARGE CONDITION:  Fair ACTIVITY:  Activity as tolerated OXYGEN:  Home Oxygen: No.  Oxygen Delivery: room air DISCHARGE LOCATION:  home with HHPT, SW, RN, 2 week protocol - Palliative care to follow - consider Hospice  If you experience worsening of your admission symptoms, develop shortness of breath, life threatening emergency, suicidal or homicidal thoughts you must seek medical attention immediately by calling 911 or calling your MD immediately  if  symptoms less severe.  You Must read complete instructions/literature along with all the possible adverse reactions/side effects for all the Medicines you take and that have been prescribed to you. Take any new Medicines after you have completely understood and accpet all the possible adverse reactions/side effects.   Please note  You were cared for by a hospitalist during your hospital stay. If you have any questions  about your discharge medications or the care you received while you were in the hospital after you are discharged, you can call the unit and asked to speak with the hospitalist on call if the hospitalist that took care of you is not available. Once you are discharged, your primary care physician will handle any further medical issues. Please note that NO REFILLS for any discharge medications will be authorized once you are discharged, as it is imperative that you return to your primary care physician (or establish a relationship with a primary care physician if you do not have one) for your aftercare needs so that they can reassess your need for medications and monitor your lab values.    On the day of Discharge:  VITAL SIGNS:  Blood pressure 122/76, pulse 96, temperature 98.1 F (36.7 C), temperature source Oral, resp. rate 19, height 5\' 5"  (1.651 m), weight 69.9 kg, SpO2 94 %. PHYSICAL EXAMINATION:  GENERAL:  84 y.o.-year-old patient lying in the bed with no acute distress.  EYES: Pupils equal, round, reactive to light and accommodation. No scleral icterus. Extraocular muscles intact.  HEENT: Head atraumatic, normocephalic. Oropharynx and nasopharynx clear.  NECK:  Supple, no jugular venous distention. No thyroid enlargement, no tenderness.  LUNGS: Normal breath sounds bilaterally, no wheezing, rales,rhonchi or crepitation. No use of accessory muscles of respiration.  CARDIOVASCULAR: S1, S2 normal. No murmurs, rubs, or gallops.  ABDOMEN: Soft, non-tender, non-distended. Bowel sounds present. No organomegaly or mass.  EXTREMITIES: No pedal edema, cyanosis, or clubbing.  NEUROLOGIC: Cranial nerves II through XII are intact. Muscle strength 5/5 in all extremities. Sensation intact. Gait not checked.  PSYCHIATRIC: The patient is alert and oriented x 3.  SKIN: No obvious rash, lesion, or ulcer.  DATA REVIEW:   CBC Recent Labs  Lab 02/22/19 0519  WBC 7.9  HGB 13.4  HCT 40.9  PLT 283     Chemistries  Recent Labs  Lab 02/19/19 0707  02/22/19 0519  NA 136   < > 136  K 3.5   < > 4.1  CL 102   < > 105  CO2 22   < > 25  GLUCOSE 145*   < > 102*  BUN 25*   < > 20  CREATININE 1.16*   < > 0.87  CALCIUM 8.9   < > 8.5*  AST 33  --   --   ALT 21  --   --   ALKPHOS 73  --   --   BILITOT 1.0  --   --    < > = values in this interval not displayed.     Microbiology Results  Urine c/s grew Klebsiella  Follow-up Information    Derinda Late, MD. Go in 3 days.   Specialty: Family Medicine Why: office will call patient Contact information: [address] [place] [address] [phone]        Leim Fabry, MD. Go on 03/09/2019.   Specialty: Orthopedic Surgery Why: appointment at 9:30am Contact information: [place] [address]  [phone]          High risk for readmissions  Management plans discussed with the patient, family and they are in agreement.  CODE STATUS: Full Code   TOTAL TIME TAKING CARE OF THIS PATIENT: 45 minutes.    Max Sane M.D on 02/22/2019 at 6:08 PM  Between 7am to 6pm - Pager - [phone]  After 6pm go to [email or web address] - Proofreader  [facility]  Office  [phone]  CC: Primary care physician; Derinda Late, MD   Note: This dictation was prepared with Dragon dictation along with smaller phrase technology. Any transcriptional errors that result from this process are unintentional.

## 2019-02-22 NOTE — Plan of Care (Signed)
  Problem: Education: Goal: Knowledge of General Education information will improve Description: Including pain rating scale, medication(s)/side effects and non-pharmacologic comfort measures Outcome: Progressing   Problem: Health Behavior/Discharge Planning: Goal: Ability to manage health-related needs will improve Outcome: Progressing   Problem: Clinical Measurements: Goal: Will remain free from infection Outcome: Progressing   Problem: Safety: Goal: Ability to remain free from injury will improve Outcome: Progressing

## 2019-02-22 NOTE — Progress Notes (Signed)
Physical Therapy Treatment Patient Details Name: Angela Leonard MRN: [id number] DOB: November 30, 1934 Today's Date: 02/22/2019    History of Present Illness presented to ER secondary to fall with worsening R hip pain, noted with elevated HR; admitted for management of Afib with RVR (now controlled with PO cardizem).  R hip, R knee, L spine, CTH and CT c-spine all negative for acute injury.    PT Comments    Pt agreeable to PT; denies pain. Pt requires Min A for all mobility to steady/for safety. Pt demonstrates ability to stand/ambulate with rolling walker with fairly quick fatigue requiring stand or seated rest during short walk or standing functional activities. Requires set up and stand steady assist with toileting. Pt reports she has help at home. Pt does have stairs to bedroom on second level. Increased HR with stand and functional activity to 150s with return to 1 teens with seated rest approximately 7 minutes. Nursing staff in to address. Pt will need to accomplish steps prior to recommending HHPT discharge.    Follow Up Recommendations  SNF     Equipment Recommendations       Recommendations for Other Services       Precautions / Restrictions Precautions Precautions: Fall Restrictions Weight Bearing Restrictions: No    Mobility  Bed Mobility               General bed mobility comments: Not tested; up in chair  Transfers Overall transfer level: Needs assistance Equipment used: Rolling walker (2 wheeled) Transfers: Sit to/from Stand Sit to Stand: Min assist         General transfer comment: Assist to steady/safety  Ambulation/Gait Ambulation/Gait assistance: Min guard;Min assist Gait Distance (Feet): 50 Feet(in room) Assistive device: Rolling walker (2 wheeled)       General Gait Details: Denies pain   Stairs             Wheelchair Mobility    Modified Rankin (Stroke Patients Only)       Balance Overall balance assessment: Needs  assistance Sitting-balance support: Feet supported Sitting balance-[name] Scale: Good     Standing balance support: Bilateral upper extremity supported Standing balance-[name] Scale: Fair                              Cognition Arousal/Alertness: Awake/alert Behavior During Therapy: WFL for tasks assessed/performed Overall Cognitive Status: No family/caregiver present to determine baseline cognitive functioning                                 General Comments: Tends to be forgetful; often repeating questions/comments      Exercises General Exercises - Lower Extremity Ankle Circles/Pumps: AROM;Both;20 reps Long Arc Quad: AROM;Both;20 reps Hip ABduction/ADduction: Strengthening;Both;10 reps;Standing Straight Leg Raises: Strengthening;Both;10 reps;Standing Hip Flexion/Marching: Strengthening;Both;10 reps;Standing Other Exercises Other Exercises: set up, supervision and assist with stand during personal hygiene    General Comments        Pertinent Vitals/Pain Pain Assessment: No/denies pain    Home Living                      Prior Function            PT Goals (current goals can now be found in the care plan section) Progress towards PT goals: Progressing toward goals    Frequency    Min 2X/week  PT Plan Current plan remains appropriate    Co-evaluation              AM-PAC PT "6 Clicks" Mobility   Outcome Measure  Help needed turning from your back to your side while in a flat bed without using bedrails?: A Little Help needed moving from lying on your back to sitting on the side of a flat bed without using bedrails?: A Little Help needed moving to and from a bed to a chair (including a wheelchair)?: A Little Help needed standing up from a chair using your arms (e.g., wheelchair or bedside chair)?: A Little Help needed to walk in hospital room?: A Little Help needed climbing 3-5 steps with a railing? : A Lot 6 Click  Score: 17    End of Session Equipment Utilized During Treatment: Gait belt Activity Tolerance: Patient tolerated treatment well Patient left: in chair;with call [place]/phone within reach;with chair alarm set   PT Visit Diagnosis: Muscle weakness (generalized) (M62.81);Difficulty in walking, not elsewhere classified (R26.2);Pain Pain - Right/Left: Right Pain - part of body: Hip     Time: 4580-9983 PT Time Calculation (min) (ACUTE ONLY): 41 min  Charges:  $Gait Training: 8-22 mins $Therapeutic Exercise: 8-22 mins $Therapeutic Activity: 8-22 mins                      Larae Grooms, PTA 02/22/2019, 1:38 PM

## 2019-02-23 LAB — URINE CULTURE
Culture: >=100000 — AB
Special Requests: NORMAL

## 2019-02-26 LAB — CULTURE, BLOOD (ROUTINE X 2)
Culture: NO GROWTH
Culture: NO GROWTH
Special Requests: ADEQUATE

## 2019-03-01 ENCOUNTER — Telehealth: Payer: Self-pay | Admitting: Nurse Practitioner

## 2019-03-01 NOTE — Telephone Encounter (Signed)
Spoke with patient's daughter [name] regarding Palliative services and she was in agreement with this.  I have scheduled a Telephone Consult for 03/22/19 @ 12 noon

## 2019-03-22 ENCOUNTER — Other Ambulatory Visit: Payer: Medicare Other | Admitting: Nurse Practitioner

## 2019-03-22 ENCOUNTER — Encounter: Payer: Self-pay | Admitting: Nurse Practitioner

## 2019-03-22 ENCOUNTER — Other Ambulatory Visit: Payer: Self-pay

## 2019-03-22 DIAGNOSIS — Z515 Encounter for palliative care: Secondary | ICD-10-CM

## 2019-03-22 NOTE — Progress Notes (Signed)
[facility] Consult Note Telephone: [phone]  Fax: [phone]  PATIENT NAME: Angela Leonard DOB: [birth year] MRN: [id number]  PRIMARY CARE PROVIDER:   Derinda Late, MD  REFERRING PROVIDER:  Derinda Late, MD [phone] [place],  [place] [address]   Due to the COVID-19 crisis, this visit was done via telemedicine from my office and it was initiated and consent by this patient and or family.  RESPONSIBLE PARTY:   Ms. Jenne; Evagelia Knack daughter  RECOMMENDATIONS and PLAN:  1. ACP DNR/MOST form completed during hospitalization; MOST form and Hard Choice book mailed and will review for further discussion and next palliative care visit  2. Chronic pain monitor on pain scale, monitor efficacy vs adverse side effects. Currently taking tylenol as needed, methotrexate increased to continue to follow by Rheumatology  3. Palliative care encounter Palliative medicine team will continue to support patient, patient's family, and medical team. Visit consisted of counseling and education dealing with the complex and emotionally intense issues of symptom management and palliative care in the setting of serious and potentially life-threatening illness  I spent 65 minutes providing this consultation,  from 11:25am to 12:30pm. More than 50% of the time in this consultation was spent coordinating communication.   HISTORY OF PRESENT ILLNESS:  Angela Leonard is a 84 y.o. year old female with multiple medical problems including Atrial fibrillation, chronic kidney disease, Rheumatoid arthritis, hypertension, hyperlipidemia, osteoporosis, chicken pox, bilateral cataracts, cholecystectomy 2007, back surgery 2006.[facility] Kitchen Hospitalized 9 / 25 / 2020 to 4 / 28 / 2020 for right hip pain after a fall. She had the inability to move and she was admitted for right hip pain and x-rays, MRI of right hip or negative for  fractures. She was having able to read difficulty. Sepsis due to Klebsiella UTI which was treated. Atrial fibrillation with RVR with labile heart rate on minimal exertion. She does take Eliquis and cardizem. Recommended [facility] but patient and daughter declined to go. They prefer to be discharged home. Right hip pain chronic with weakness. She is a full code. She was seen at [facility] Rheumatology [facility] / 15 / 2020 her follow-up visit. Methotrexate increase to 7 tabs weekly 2.5 mg for 12 weeks. [name] does reside at home with her daughter. I called misspells daughter, Angela Leonard for scheduled initial palliative care telemedicine telephonic visit. We talked about the last time Angela Leonard was independent at home functionally. We talked about has medical history in the city of chronic disease. Angela Leonard endorses she only had rheumatoid arthritis since she was in her seventies. We talked about recent hospitalization. We talked about her work with physical therapy for what she has two more visits. Angela Leonard endorses Angela Leonard is walking although it does hurt intermittently with ambulation. We talked about the exercises that she is been doing with therapy. Angela Leonard endorses she is concerned that Ms Leonard will not continue the exercises wants therapy is complete. Angela Leonard talked about their current home situation where Angela Leonard has recently had a broken leg in surgery also. Angela Leonard endorses she is hired caregivers to help in the home with her and Ms Stegenga. We talked about pain management. We talked about chronic disease progression of rheumatoid arthritis. We talked about Rheumatology appointment and increasing Methotrexate to 7 days a week for 12 weeks. We talked about her appetite. We talked about realistic expectations in the setting of natural aging. We talked  about medical goals of care including aggressive versus conservative versus comfort care. We talked about MOST form and Hard Choice book.  Angela Leonard in agreement to having a blank MOST form and Hard Choice book mailed and will review for further discussion and next palliative care visit. We talked about code status says she currently is a full code. Angela Leonard and Ms. Willette endorses that they would like more time to think about DNR versus full code as discussed various scenarios. We talked about role of palliative care and plan of care. We talked about palliative care social worker. Angela Leonard was asking questions about resources available through Insurance in addition to completing advance directives. Angela Leonard endorses she would like for the palliative care social worker to call her for further discussion the insurance, caregivers and notarizing living will and health care power-of-attorney. We talked about follow-up visit in two weeks if needed or sooner should she declined. Angela Leonard in agreement and will revisit how she does since therapy will have been completed for almost 2 weeks. Angela Leonard and Angela Leonard in agreement. Therapeutic listening and emotional support provided. Questions answer to satisfaction. Contact information provided. Palliative Care was asked to help address goals of care.   CODE STATUS: Full code  PPS: 50% HOSPICE ELIGIBILITY/DIAGNOSIS: TBD  PAST MEDICAL HISTORY:  Past Medical History:  Diagnosis Date  . HLD (hyperlipidemia)   . Hypertension     SOCIAL HX:  Social History   Tobacco Use  . Smoking status: Former Research scientist (life sciences)  . Smokeless tobacco: Never Used  Substance Use Topics  . Alcohol use: No    ALLERGIES: No Known Allergies   PERTINENT MEDICATIONS:  Outpatient Encounter Medications as of 03/22/2019  Medication Sig  . aspirin EC 81 MG tablet Take 81 mg by mouth at bedtime.  . cholecalciferol (VITAMIN D) 1000 units tablet Take 1,000 Units by mouth daily.  Marland Kitchen diltiazem (TIAZAC) 120 MG 24 hr capsule Take 120 mg by mouth daily.  Marland Kitchen ELIQUIS 5 MG TABS tablet Take 5 mg by mouth every 12 (twelve) hours.  . folic acid  (FOLVITE) 1 MG tablet Take 1 mg by mouth daily.  . methotrexate (RHEUMATREX) 2.5 MG tablet Take 6 tablets by mouth every Tuesday.   . Multiple Vitamin (MULTIVITAMIN WITH MINERALS) TABS tablet Take 1 tablet by mouth daily.  . ramipril (ALTACE) 10 MG capsule Take 10 mg by mouth daily.  . simvastatin (ZOCOR) 20 MG tablet Take 20 mg by mouth at bedtime.  . vitamin B-12 (CYANOCOBALAMIN) 1000 MCG tablet Take 1,000 mcg by mouth daily.   No facility-administered encounter medications on file as of 03/22/2019.     PHYSICAL EXAM:   Deferred   Nester Bachus Z Ceceilia Cephus, NP

## 2019-03-23 ENCOUNTER — Telehealth: Payer: Self-pay

## 2019-03-23 NOTE — Telephone Encounter (Signed)
SW received referral from [place], NP regarding resource discussion. SW contacted Angela Leonard (patient's daughter). Angela Leonard provided overview of patient situation and care. Patient is finishing up on home health therapy and doing well. Angela Leonard said they have a caregiver that comes to help with care and household tasks three days a week. Angela Leonard is the in process of working on [place] will form with patient. Angela Leonard denies needs, said that she has the form and SW reviewed process with her. Patient does have LTC policy, and SW encouraged Angela Leonard to start looking into that to see what options would be available. Angela Leonard agreed. SW provided contact information. SW used active and reflective listening, provided education on HCPOA and LTC policies and discussed goals of care. Angela Leonard appreciative of time and conversation.

## 2019-04-08 ENCOUNTER — Other Ambulatory Visit: Payer: Self-pay

## 2019-04-08 ENCOUNTER — Telehealth: Payer: Self-pay | Admitting: Nurse Practitioner

## 2019-04-08 ENCOUNTER — Other Ambulatory Visit: Payer: Medicare Other | Admitting: Nurse Practitioner

## 2019-04-08 NOTE — Telephone Encounter (Signed)
I called multiple times for scheduled telemedicine, telephonic palliative care follow-up visit, busy, unable to leave a message, will continue to reschedule

## 2019-05-13 ENCOUNTER — Other Ambulatory Visit: Payer: Self-pay

## 2019-05-13 ENCOUNTER — Other Ambulatory Visit: Payer: Medicare Other | Admitting: Nurse Practitioner

## 2019-05-13 ENCOUNTER — Encounter: Payer: Self-pay | Admitting: Nurse Practitioner

## 2019-05-13 DIAGNOSIS — Z515 Encounter for palliative care: Secondary | ICD-10-CM

## 2019-05-13 NOTE — Progress Notes (Signed)
[facility] Consult Note Telephone: [phone]  Fax: [phone]  PATIENT NAME: Angela Leonard DOB: 01/21/1935 MRN: [id number]  PRIMARY CARE PROVIDER:   Derinda Late, MD  REFERRING PROVIDER:  Derinda Late, MD [phone] [address] [facility] [place],  [place] [address]  Due to the COVID-19 crisis, this visit was done via telemedicine from my office and it was initiated and consent by this patient and or family.  RESPONSIBLE PARTY:   Angela Leonard; Angela Leonard daughter  RECOMMENDATIONS and PLAN:  1. ACP; FULL CODE with AGGRESSIVE INTERVENTIONS: discussed with Angela Leonard at this visit and at this time she received the information. Angela Leonard requested to talk with Angela Leonard and review at another time possible next [facility] schedule visit. Angela Leonard in agreement should it come to where they want to further discuss or complete MOST form; medical goals of care,revisit code status Angela Leonard can call for sooner appointment.  2. Chronic pain monitor on pain scale, monitor efficacy vs adverse side effects. Currently taking tylenol as needed, methotrexate increased to continue to follow by Rheumatology  3. Palliative care encounter Palliative medicine team will continue to support patient, patient's family, and medical team. Visit consisted of counseling and education dealing with the complex and emotionally intense issues of symptom management and palliative care in the setting of serious and potentially life-threatening illness I spent 40 minutes providing this consultation,  from 12:50pm to 1:30pm. More than 50% of the time in this consultation was spent coordinating communication.   HISTORY OF PRESENT ILLNESS:  Angela Leonard is a 84 y.o. year old female with multiple medical problems including Atrial fibrillation, chronic kidney disease, Rheumatoid arthritis, hypertension, hyperlipidemia, osteoporosis, chicken pox,  bilateral cataracts, cholecystectomy 2007, back surgery 2006. Hospitalized 9 / 25 / 2020 to 17 / 28 / 2020 for right hip pain after a fall. She had the inability to move and she was admitted for right hip pain and x-rays, MRI of right hip or negative for fractures. She was having able to read difficulty. Sepsis due to Klebsiella UTI which was treated. Angela Leonard called Angela Leonard, Angela Leonard's daughter for telemedicine telephonic follow-up palliative care visit. We talked about how Angela Leonard has been doing. Angela Leonard endorses that she seems to be sleeping more. Angela Leonard sleeps during the day on the couch and then has difficulty sleeping at night. We talked about sleep hygiene. Angela Leonard endorses that Angela Leonard's wishes to sleep when she is tired. We talked about the challenges with covid pandemic social isolation. We talked about symptoms of pain and shortness of breath what she does not exhibit. We talked about her appetite. Angela Leonard endorses somedays she eats better than others. We talked about her functional level as she is ambulatory not walking with a cane or walker. Her physical therapy is completed. Angela Leonard does have a caregiver that does come in to help. Angela Leonard endorses she did talk with palliative care social worker Angela Leonard and that she found the visit to be very helpful. We talked about medical goals of care including aggressive versus conservative versus Comfort Care. Angela Leonard endorses she did receive the Hard Choice book and MOST form but at this point in time she wants to put it on the back burner until she can further discuss with Angela Leonard at a better time. We talked about at this point in time for a minute full code but aggressive interventions. We alked about role of palliative care and plan  of care. Discuss will follow up in one month if needed or sooner should she declined. Angela Leonard in agreement and will revisit medical goals at that time. Appointment scheduled. Therapeutic listening and emotional support  provided. Contact information  Palliative Care was asked to help to continue to address goals of care.   CODE STATUS: Full code  PPS: 50% HOSPICE ELIGIBILITY/DIAGNOSIS: TBD  PAST MEDICAL HISTORY:  Past Medical History:  Diagnosis Date  . HLD (hyperlipidemia)   . Hypertension     SOCIAL HX:  Social History   Tobacco Use  . Smoking status: Former Research scientist (life sciences)  . Smokeless tobacco: Never Used  Substance Use Topics  . Alcohol use: No    ALLERGIES: No Known Allergies   PERTINENT MEDICATIONS:  Outpatient Encounter Medications as of 05/13/2019  Medication Sig  . aspirin EC 81 MG tablet Take 81 mg by mouth at bedtime.  . cholecalciferol (VITAMIN D) 1000 units tablet Take 1,000 Units by mouth daily.  Marland Kitchen diltiazem (TIAZAC) 120 MG 24 hr capsule Take 120 mg by mouth daily.  Marland Kitchen ELIQUIS 5 MG TABS tablet Take 5 mg by mouth every 12 (twelve) hours.  . folic acid (FOLVITE) 1 MG tablet Take 1 mg by mouth daily.  . methotrexate (RHEUMATREX) 2.5 MG tablet Take 6 tablets by mouth every Tuesday.   . Multiple Vitamin (MULTIVITAMIN WITH MINERALS) TABS tablet Take 1 tablet by mouth daily.  . ramipril (ALTACE) 10 MG capsule Take 10 mg by mouth daily.  . simvastatin (ZOCOR) 20 MG tablet Take 20 mg by mouth at bedtime.  . vitamin B-12 (CYANOCOBALAMIN) 1000 MCG tablet Take 1,000 mcg by mouth daily.   No facility-administered encounter medications on file as of 05/13/2019.    PHYSICAL EXAM:   Deferred  Deauna Yaw Z Katonya Blecher, NP

## 2019-06-28 ENCOUNTER — Encounter: Payer: Self-pay | Admitting: Nurse Practitioner

## 2019-06-28 ENCOUNTER — Other Ambulatory Visit: Payer: Medicare Other | Admitting: Nurse Practitioner

## 2019-06-28 ENCOUNTER — Other Ambulatory Visit: Payer: Self-pay

## 2019-06-28 DIAGNOSIS — M069 Rheumatoid arthritis, unspecified: Secondary | ICD-10-CM

## 2019-06-28 DIAGNOSIS — Z515 Encounter for palliative care: Secondary | ICD-10-CM

## 2019-06-28 NOTE — Progress Notes (Addendum)
[facility] Consult Note Telephone: [phone]  Fax: [phone]  PATIENT NAME: Raegyn Renda DOB: 1935-05-07 MRN: [id number] PRIMARY CARE PROVIDER:   Derinda Late, MD  REFERRING PROVIDER:  Derinda Late, MD [phone] [address] [facility] [place],  [place] [address]  Due to the COVID-19 crisis, this visit was done via telemedicine from my office and it was initiated and consent by this patient and or family.  RESPONSIBLE PARTY:Ms. Highbaugh-self; [name]  RECOMMENDATIONS and PLAN: 1.ACP; FULL CODE with AGGRESSIVE INTERVENTIONS: discussed with [name] at this visit and at this time she received the information. [name] requested to talk with Ms. [place] and review at another time possible next [facility] schedule visit. [name] in agreement should it come to where they want to further discuss or complete MOST form; medical goals of care,revisit code status [name] can call for sooner appointment.   2.Chronic pain monitor on pain scale, monitor efficacy vs adverse side effects. Currently takingtylenol as needed, methotrexate increased to continue to follow by Rheumatology  3.Palliative care encounter Palliative medicine team will continue to support patient, patient's family, and medical team. Visit consisted of counseling and education dealing with the complex and emotionally intense issues of symptom management and palliative care in the setting of serious and potentially life-threatening illness  I spent 40 minutes providing this consultation,  from 1:00pm to 1:40pm. More than 50% of the time in this consultation was spent coordinating communication.   HISTORY OF PRESENT ILLNESS:  Kyli Sorter is a 85 y.o. year old female with multiple medical problems including Atrial fibrillation, chronic kidney disease, Rheumatoid arthritis, hypertension, hyperlipidemia, osteoporosis, chicken pox,  bilateral cataracts, cholecystectomy 2007, back surgery 2006. I called Ms. Ortez for schedule palliative care telemedicine is video not available telephonic follow-up visit. [name], Ms. Palazzi daughter answered the phone, we talked about palliative care visit. [name] and Ms. Gilbo in agreement. We talked about hemisphere has been feeling. Ms. Starrett endorses that she has been doing well. Ms. Manz having no symptoms of pain or shortness of breath. Ms. Oshea has had a good appetite. Ms. Mangiapane endorses her daily routine consists of a lot of sitting. Ms. Holden endorses that she does not feel like she has lost any weight. Ms. Bonine endorses that she does sleep through the night but has been napping more during the day. We talked about medical goals of care. [name] and I talked about Ms. Molder sleeping patterns. [name] endorses that Ms. Fritchman sleeps most of the day and then does get up during the night. We talked about the option of using melatonin starting low-dose like 1 mg to see if that can help adjust sleep patterns. We talked about sleep hygiene. We talked about non-medication alternative modalities. We talked about role of palliative care and plan of care. Discuss will follow up in six weeks if needed or sooner should she did fine. [name] and Ms. Fiala in agreement. We talked about covid pandemic and social isolation. Questions answered to satisfaction. Contact information. Palliative Care was asked to help to continue to address goals of care.   CODE STATUS: Full code  PPS: 50% HOSPICE ELIGIBILITY/DIAGNOSIS: TBD  PAST MEDICAL HISTORY:  Past Medical History:  Diagnosis Date  . HLD (hyperlipidemia)   . Hypertension     SOCIAL HX:  Social History   Tobacco Use  . Smoking status: Former Research scientist (life sciences)  . Smokeless tobacco: Never Used  Substance Use Topics  .  Alcohol use: No    ALLERGIES: No Known Allergies   PERTINENT MEDICATIONS:  Outpatient Encounter Medications as of 06/28/2019  Medication Sig  .  aspirin EC 81 MG tablet Take 81 mg by mouth at bedtime.  . cholecalciferol (VITAMIN D) 1000 units tablet Take 1,000 Units by mouth daily.  Marland Kitchen diltiazem (TIAZAC) 120 MG 24 hr capsule Take 120 mg by mouth daily.  Marland Kitchen ELIQUIS 5 MG TABS tablet Take 5 mg by mouth every 12 (twelve) hours.  . folic acid (FOLVITE) 1 MG tablet Take 1 mg by mouth daily.  . methotrexate (RHEUMATREX) 2.5 MG tablet Take 6 tablets by mouth every Tuesday.   . Multiple Vitamin (MULTIVITAMIN WITH MINERALS) TABS tablet Take 1 tablet by mouth daily.  . ramipril (ALTACE) 10 MG capsule Take 10 mg by mouth daily.  . simvastatin (ZOCOR) 20 MG tablet Take 20 mg by mouth at bedtime.  . vitamin B-12 (CYANOCOBALAMIN) 1000 MCG tablet Take 1,000 mcg by mouth daily.   No facility-administered encounter medications on file as of 06/28/2019.    PHYSICAL EXAM:  Deferred  Reneka Nebergall Z Shealynn Saulnier, NP

## 2019-08-02 ENCOUNTER — Other Ambulatory Visit: Payer: Self-pay

## 2019-08-02 ENCOUNTER — Encounter: Payer: Self-pay | Admitting: Nurse Practitioner

## 2019-08-02 ENCOUNTER — Other Ambulatory Visit: Payer: Medicare Other | Admitting: Nurse Practitioner

## 2019-08-02 DIAGNOSIS — M069 Rheumatoid arthritis, unspecified: Secondary | ICD-10-CM

## 2019-08-02 DIAGNOSIS — Z515 Encounter for palliative care: Secondary | ICD-10-CM

## 2019-08-02 NOTE — Progress Notes (Signed)
[facility] Consult Note Telephone: [phone]  Fax: [phone]  PATIENT NAME: Angela Leonard DOB: January 23, 1935 MRN: [id number]  PRIMARY CARE PROVIDER:Babaoff, Beverely Low, MD  REFERRING Doree Fudge, MD [phone] [address] [facility] [place], [place] [address]  Due to the COVID-19 crisis, this visit was done via telemedicine from my office and it was initiated and consent by this patient and or family.  RESPONSIBLE PARTY:Ms. Nest-self; [name]  RECOMMENDATIONS and PLAN: 1.ACP; Made DNR, placed in Epic/Vynca; will mail blank most form, Hard Choice book for review to attempt to complete at next [facility] visit.  2.Chronic pain monitor on pain scale, monitor efficacy vs adverse side effects. Currently takingtylenol as needed, methotrexate increased to continue to follow by Rheumatology  3.Palliative care encounter Palliative medicine team will continue to support patient, patient's family, and medical team. Visit consisted of counseling and education dealing with the complex and emotionally intense issues of symptom management and palliative care in the setting of serious and potentially life-threatening illness  I spent 45 minutes providing this consultation,  from 1:00pm to 1:45pm More than 50% of the time in this consultation was spent coordinating communication.   HISTORY OF PRESENT ILLNESS:  Angela Leonard is a 85 y.o. year old female with multiple medical problems including Atrial fibrillation, chronic kidney disease, Rheumatoid arthritis, hypertension, hyperlipidemia, osteoporosis, chicken pox, bilateral cataracts, cholecystectomy 2007, back surgery 2006.  I called Ms Batley's daughter [name] for scheduled telemedicine telephonic is video not available palliative care follow-up visit. [name] and I talked about purpose of palliative care visit in an agreement. We talked  about how Ms. Mato has been doing. [name] endorses that Ms. Carrico does continue to sleep a lot. Ms. [place] is not very motivated to do things. Ms. Kulzer is not social . She remains in the house and she says not to go outside. [name] endorses they have their food delivered by [employer] or another food delivery service. We talked about Ms. Mcelrath appetite. [name] endorses Ms Spong's appetite is very good. All she wants either hamburgers. No noted weight loss. We talked about her functional ability. Ms. Eppard has continued to walk independently without a walker. No recent falls, wounds, hospitalizations, infections. We talked about medical goals of care including aggressive versus conservative vs comfort care. We reviewed the MOST form. [name] endorses she has not had time to look at the most form. Wii review code status as she currently has a full code. [name] endorses she and Ms. Lauture did discuss code status. Ms. Pellegrin wishes to be a do not resuscitate. [name] in agreement for [place] form to be completed in mailed. Will remail on second MOST form Hard Choice book is [name] is not able to locate the one that was previously mailed. We talked about follow up palliative care visit in 8 weeks if needed or sooner should Ms. Romberger decline. [name] in agreement and appointment scheduled. Therapeutic listening and emotional support provided. Questions answered to satisfaction. Contact information provided.  Palliative Care was asked to help to continue to address goals of care.   CODE STATUS: DNR  PPS: 50% HOSPICE ELIGIBILITY/DIAGNOSIS: TBD  PAST MEDICAL HISTORY:  Past Medical History:  Diagnosis Date  . HLD (hyperlipidemia)   . Hypertension     SOCIAL HX:  Social History   Tobacco Use  . Smoking status: Former Research scientist (life sciences)  . Smokeless tobacco: Never Used  Substance Use Topics  . Alcohol use:  No    ALLERGIES: No Known Allergies   PERTINENT MEDICATIONS:  Outpatient Encounter Medications as of  08/02/2019  Medication Sig  . aspirin EC 81 MG tablet Take 81 mg by mouth at bedtime.  . cholecalciferol (VITAMIN D) 1000 units tablet Take 1,000 Units by mouth daily.  Marland Kitchen diltiazem (TIAZAC) 120 MG 24 hr capsule Take 120 mg by mouth daily.  Marland Kitchen ELIQUIS 5 MG TABS tablet Take 5 mg by mouth every 12 (twelve) hours.  . folic acid (FOLVITE) 1 MG tablet Take 1 mg by mouth daily.  . methotrexate (RHEUMATREX) 2.5 MG tablet Take 6 tablets by mouth every Tuesday.   . Multiple Vitamin (MULTIVITAMIN WITH MINERALS) TABS tablet Take 1 tablet by mouth daily.  . ramipril (ALTACE) 10 MG capsule Take 10 mg by mouth daily.  . simvastatin (ZOCOR) 20 MG tablet Take 20 mg by mouth at bedtime.  . vitamin B-12 (CYANOCOBALAMIN) 1000 MCG tablet Take 1,000 mcg by mouth daily.   No facility-administered encounter medications on file as of 08/02/2019.    PHYSICAL EXAM:  Deferred  Mariabella Nilsen Z Manasvi Dickard, NP

## 2019-08-23 ENCOUNTER — Ambulatory Visit: Payer: Medicare Other | Attending: Internal Medicine

## 2019-08-23 ENCOUNTER — Other Ambulatory Visit: Payer: Self-pay

## 2019-08-23 DIAGNOSIS — Z23 Encounter for immunization: Secondary | ICD-10-CM

## 2019-08-23 NOTE — Progress Notes (Signed)
   Covid-19 Vaccination Clinic  Name:  Angela Leonard    MRN: [id number] DOB: 03-14-1935  08/23/2019  [name] was observed post Covid-19 immunization for 15 minutes without incident. She was provided with Vaccine Information Sheet and instruction to access the V-Safe system.   [name] was instructed to call 911 with any severe reactions post vaccine: Marland Kitchen Difficulty breathing  . Swelling of face and throat  . A fast heartbeat  . A bad rash all over body  . Dizziness and weakness   Immunizations Administered    Name Date Dose VIS Date Route   Pfizer COVID-19 Vaccine 08/23/2019  2:25 PM 0.3 mL 05/07/2019 Intramuscular   Manufacturer: [place]   Lot: [id number]   [place]: [id number]

## 2019-08-24 ENCOUNTER — Inpatient Hospital Stay
Admission: EM | Admit: 2019-08-24 | Discharge: 2019-08-27 | DRG: 690 | Disposition: A | Discharge status: Home Health | Payer: Medicare Other | Attending: Family Medicine | Admitting: Family Medicine

## 2019-08-24 ENCOUNTER — Emergency Department: Payer: Medicare Other

## 2019-08-24 ENCOUNTER — Other Ambulatory Visit: Payer: Self-pay

## 2019-08-24 DIAGNOSIS — I482 Chronic atrial fibrillation, unspecified: Secondary | ICD-10-CM | POA: Diagnosis not present

## 2019-08-24 DIAGNOSIS — Z8249 Family history of ischemic heart disease and other diseases of the circulatory system: Secondary | ICD-10-CM

## 2019-08-24 DIAGNOSIS — G9340 Encephalopathy, unspecified: Secondary | ICD-10-CM

## 2019-08-24 DIAGNOSIS — Z23 Encounter for immunization: Secondary | ICD-10-CM

## 2019-08-24 DIAGNOSIS — M79621 Pain in right upper arm: Secondary | ICD-10-CM | POA: Diagnosis present

## 2019-08-24 DIAGNOSIS — Z7982 Long term (current) use of aspirin: Secondary | ICD-10-CM

## 2019-08-24 DIAGNOSIS — S0181XA Laceration without foreign body of other part of head, initial encounter: Secondary | ICD-10-CM | POA: Diagnosis not present

## 2019-08-24 DIAGNOSIS — Z20822 Contact with and (suspected) exposure to covid-19: Secondary | ICD-10-CM | POA: Diagnosis present

## 2019-08-24 DIAGNOSIS — W19XXXA Unspecified fall, initial encounter: Secondary | ICD-10-CM | POA: Diagnosis present

## 2019-08-24 DIAGNOSIS — N390 Urinary tract infection, site not specified: Secondary | ICD-10-CM | POA: Diagnosis present

## 2019-08-24 DIAGNOSIS — Z87891 Personal history of nicotine dependence: Secondary | ICD-10-CM

## 2019-08-24 DIAGNOSIS — D7589 Other specified diseases of blood and blood-forming organs: Secondary | ICD-10-CM | POA: Diagnosis present

## 2019-08-24 DIAGNOSIS — M069 Rheumatoid arthritis, unspecified: Secondary | ICD-10-CM | POA: Diagnosis present

## 2019-08-24 DIAGNOSIS — E785 Hyperlipidemia, unspecified: Secondary | ICD-10-CM | POA: Diagnosis present

## 2019-08-24 DIAGNOSIS — I951 Orthostatic hypotension: Secondary | ICD-10-CM | POA: Diagnosis present

## 2019-08-24 DIAGNOSIS — E860 Dehydration: Secondary | ICD-10-CM | POA: Diagnosis present

## 2019-08-24 DIAGNOSIS — M160 Bilateral primary osteoarthritis of hip: Secondary | ICD-10-CM | POA: Diagnosis present

## 2019-08-24 DIAGNOSIS — F0390 Unspecified dementia without behavioral disturbance: Secondary | ICD-10-CM | POA: Diagnosis present

## 2019-08-24 DIAGNOSIS — Z79899 Other long term (current) drug therapy: Secondary | ICD-10-CM

## 2019-08-24 DIAGNOSIS — Y92009 Unspecified place in unspecified non-institutional (private) residence as the place of occurrence of the external cause: Secondary | ICD-10-CM

## 2019-08-24 DIAGNOSIS — R300 Dysuria: Secondary | ICD-10-CM | POA: Diagnosis not present

## 2019-08-24 DIAGNOSIS — N3000 Acute cystitis without hematuria: Secondary | ICD-10-CM

## 2019-08-24 DIAGNOSIS — I10 Essential (primary) hypertension: Secondary | ICD-10-CM | POA: Diagnosis present

## 2019-08-24 DIAGNOSIS — Z7901 Long term (current) use of anticoagulants: Secondary | ICD-10-CM

## 2019-08-24 DIAGNOSIS — E876 Hypokalemia: Secondary | ICD-10-CM | POA: Diagnosis present

## 2019-08-24 LAB — CBC WITH DIFFERENTIAL/PLATELET
Abs Immature Granulocytes: 0.05 10*3/uL (ref 0.00–0.07)
Basophils Absolute: 0 10*3/uL (ref 0.0–0.1)
Basophils Relative: 0 %
Eosinophils Absolute: 0.1 10*3/uL (ref 0.0–0.5)
Eosinophils Relative: 1 %
HCT: 46.3 % — ABNORMAL HIGH (ref 36.0–46.0)
Hemoglobin: 15.4 g/dL — ABNORMAL HIGH (ref 12.0–15.0)
Immature Granulocytes: 1 %
Lymphocytes Relative: 14 %
Lymphs Abs: 1.2 10*3/uL (ref 0.7–4.0)
MCH: 34.1 pg — ABNORMAL HIGH (ref 26.0–34.0)
MCHC: 33.3 g/dL (ref 30.0–36.0)
MCV: 102.4 fL — ABNORMAL HIGH (ref 80.0–100.0)
Monocytes Absolute: 0.7 10*3/uL (ref 0.1–1.0)
Monocytes Relative: 8 %
Neutro Abs: 6.7 10*3/uL (ref 1.7–7.7)
Neutrophils Relative %: 76 %
Platelets: 246 10*3/uL (ref 150–400)
RBC: 4.52 MIL/uL (ref 3.87–5.11)
RDW: 13.9 % (ref 11.5–15.5)
WBC: 8.8 10*3/uL (ref 4.0–10.5)
nRBC: 0 % (ref 0.0–0.2)

## 2019-08-24 LAB — BASIC METABOLIC PANEL
Anion gap: 13 (ref 5–15)
BUN: 17 mg/dL (ref 8–23)
CO2: 24 mmol/L (ref 22–32)
Calcium: 9.5 mg/dL (ref 8.9–10.3)
Chloride: 103 mmol/L (ref 98–111)
Creatinine, Ser: 0.94 mg/dL (ref 0.44–1.00)
GFR calc Af Amer: >60 mL/min (ref >60)
GFR calc non Af Amer: 56 mL/min — ABNORMAL LOW (ref >60)
Glucose, Bld: 106 mg/dL — ABNORMAL HIGH (ref 70–99)
Potassium: 3.4 mmol/L — ABNORMAL LOW (ref 3.5–5.1)
Sodium: 140 mmol/L (ref 135–145)

## 2019-08-24 LAB — URINALYSIS, COMPLETE (UACMP) WITH MICROSCOPIC
Bilirubin Urine: NEGATIVE
Glucose, UA: NEGATIVE mg/dL
Ketones, ur: 20 mg/dL — AB
Nitrite: NEGATIVE
Protein, ur: 30 mg/dL — AB
Specific Gravity, Urine: 1.021 (ref 1.005–1.030)
pH: 5 (ref 5.0–8.0)

## 2019-08-24 LAB — SARS CORONAVIRUS 2 (TAT 6-24 HRS): SARS Coronavirus 2: NEGATIVE

## 2019-08-24 LAB — CK: Total CK: 79 U/L (ref 38–234)

## 2019-08-24 MED ORDER — DOUBLE ANTIBIOTIC 500-10000 UNIT/GM EX OINT
TOPICAL_OINTMENT | Freq: Two times a day (BID) | CUTANEOUS | Status: DC
  Filled 2019-08-24: qty 28.35

## 2019-08-24 MED ORDER — TETANUS-DIPHTH-ACELL PERTUSSIS 5-2.5-18.5 LF-MCG/0.5 IM SUSP
0.5000 mL | Freq: Once | INTRAMUSCULAR | Status: AC
  Administered 2019-08-24: 0.5 mL via INTRAMUSCULAR
  Filled 2019-08-24: qty 0.5

## 2019-08-24 MED ORDER — ACETAMINOPHEN 325 MG PO TABS
650.0000 mg | ORAL_TABLET | Freq: Four times a day (QID) | ORAL | Status: DC | PRN
  Administered 2019-08-24 – 2019-08-26 (×3): 650 mg via ORAL
  Filled 2019-08-24 (×3): qty 2

## 2019-08-24 MED ORDER — ASPIRIN EC 81 MG PO TBEC
81.0000 mg | DELAYED_RELEASE_TABLET | Freq: Every day | ORAL | Status: DC
  Administered 2019-08-24 – 2019-08-26 (×3): 81 mg via ORAL
  Filled 2019-08-24 (×3): qty 1

## 2019-08-24 MED ORDER — ACETAMINOPHEN 500 MG PO TABS
1000.0000 mg | ORAL_TABLET | Freq: Once | ORAL | Status: AC
  Administered 2019-08-24: 1000 mg via ORAL
  Filled 2019-08-24: qty 2

## 2019-08-24 MED ORDER — RAMIPRIL 10 MG PO CAPS
10.0000 mg | ORAL_CAPSULE | Freq: Every day | ORAL | Status: DC
  Administered 2019-08-24 – 2019-08-27 (×4): 10 mg via ORAL
  Filled 2019-08-24 (×5): qty 1

## 2019-08-24 MED ORDER — FOLIC ACID 1 MG PO TABS
1.0000 mg | ORAL_TABLET | Freq: Every day | ORAL | Status: DC
  Administered 2019-08-24 – 2019-08-27 (×4): 1 mg via ORAL
  Filled 2019-08-24 (×4): qty 1

## 2019-08-24 MED ORDER — SODIUM CHLORIDE 0.9 % IV SOLN: INTRAVENOUS | Status: DC

## 2019-08-24 MED ORDER — OCUVITE-LUTEIN PO CAPS
1.0000 | ORAL_CAPSULE | Freq: Every day | ORAL | Status: DC
  Administered 2019-08-24 – 2019-08-27 (×4): 1 via ORAL
  Filled 2019-08-24 (×4): qty 1

## 2019-08-24 MED ORDER — ONDANSETRON HCL 4 MG/2ML IJ SOLN: 4.0000 mg | Freq: Three times a day (TID) | INTRAMUSCULAR | Status: DC | PRN

## 2019-08-24 MED ORDER — METHOTREXATE 2.5 MG PO TABS
17.5000 mg | ORAL_TABLET | ORAL | Status: DC
  Administered 2019-08-24: 17.5 mg via ORAL
  Filled 2019-08-24: qty 7

## 2019-08-24 MED ORDER — SODIUM CHLORIDE 0.9 % IV SOLN
2.0000 g | Freq: Once | INTRAVENOUS | Status: AC
  Administered 2019-08-24: 2 g via INTRAVENOUS
  Filled 2019-08-24: qty 20

## 2019-08-24 MED ORDER — VITAMIN B-12 1000 MCG PO TABS
1000.0000 ug | ORAL_TABLET | Freq: Every day | ORAL | Status: DC
  Administered 2019-08-24 – 2019-08-27 (×4): 1000 ug via ORAL
  Filled 2019-08-24 (×5): qty 1

## 2019-08-24 MED ORDER — SIMVASTATIN 20 MG PO TABS
20.0000 mg | ORAL_TABLET | Freq: Every day | ORAL | Status: DC
  Administered 2019-08-24 – 2019-08-27 (×4): 20 mg via ORAL
  Filled 2019-08-24 (×4): qty 1

## 2019-08-24 MED ORDER — SODIUM CHLORIDE 0.9 % IV BOLUS
1000.0000 mL | Freq: Once | INTRAVENOUS | Status: AC
  Administered 2019-08-24: 1000 mL via INTRAVENOUS

## 2019-08-24 MED ORDER — POTASSIUM CHLORIDE CRYS ER 20 MEQ PO TBCR
30.0000 meq | EXTENDED_RELEASE_TABLET | Freq: Once | ORAL | Status: AC
  Administered 2019-08-24: 30 meq via ORAL
  Filled 2019-08-24: qty 2

## 2019-08-24 MED ORDER — VITAMIN D 25 MCG (1000 UNIT) PO TABS
1000.0000 [IU] | ORAL_TABLET | Freq: Every day | ORAL | Status: DC
  Administered 2019-08-24 – 2019-08-27 (×4): 1000 [IU] via ORAL
  Filled 2019-08-24 (×4): qty 1

## 2019-08-24 MED ORDER — LIDOCAINE-EPINEPHRINE 2 %-1:100000 IJ SOLN
20.0000 mL | Freq: Once | INTRAMUSCULAR | Status: AC
  Administered 2019-08-24: 10 mL via INTRADERMAL
  Filled 2019-08-24: qty 1

## 2019-08-24 MED ORDER — DILTIAZEM HCL ER COATED BEADS 120 MG PO CP24
120.0000 mg | ORAL_CAPSULE | Freq: Every day | ORAL | Status: DC
  Administered 2019-08-24 – 2019-08-26 (×3): 120 mg via ORAL
  Filled 2019-08-24 (×4): qty 1

## 2019-08-24 MED ORDER — SODIUM CHLORIDE 0.9 % IV SOLN
2.0000 g | Freq: Once | INTRAVENOUS | Status: AC
  Administered 2019-08-25: 2 g via INTRAVENOUS
  Filled 2019-08-24: qty 2

## 2019-08-24 MED ORDER — HYDRALAZINE HCL 20 MG/ML IJ SOLN: 5.0000 mg | INTRAMUSCULAR | Status: DC | PRN

## 2019-08-24 MED ORDER — DILTIAZEM HCL ER COATED BEADS 120 MG PO CP24
120.0000 mg | ORAL_CAPSULE | Freq: Once | ORAL | Status: AC
  Administered 2019-08-24: 120 mg via ORAL
  Filled 2019-08-24: qty 1

## 2019-08-24 NOTE — ED Triage Notes (Addendum)
BIB EMS from home. C/O fall. Around midnight, pt was found laying on floor(unsure if fall or just laying down) then around 1:45am pt was found to be on the floor by daughter. Unwitnessed fall. Unsure of LOC. Left frontal area is swollen and bleeding. L eye has has bruising and laceration. Pt states bilat arms hurts when she moves then and also bilat upper back pain. Pt on eliquis.  Hx of baseline dementia and sundowns. Pt had 1st covid vaccine 3/29. EMS states pt BP when laying is 140/60 and sitting is 100/52.

## 2019-08-24 NOTE — Consult Note (Addendum)
[facility] Nurse Consult Note: Reason for Consult: Consult requested for left eye laceration.  Pt had a fall prior to admission and has a dark purple bruise to left outer eye and partial thickness laceration located in this area. Affected location is approx 3X3cm.  Pt had laceration to head repaired with sutures in the ED recently. Dressing procedure/placement/frequency: Topical treatment orders provided for bedside nurses to perform as follows to promote moist healing: apply antibiotic ointment to left eye laceration BID, leave open to air Please re-consult if further assistance is needed.  Thank-you,  Julien Girt MSN, [place], [place], [place], [place]

## 2019-08-24 NOTE — ED Provider Notes (Signed)
[facility] Emergency Department Provider Note  ____________________________________________   First MD Initiated Contact with Patient 08/24/19 0255     (approximate)  I have reviewed the triage vital signs and the nursing notes.   HISTORY  Chief Complaint Fall    HPI Angela Leonard is a 85 y.o. female  with past medical history of mild dementia, A. fib, hypertension, rheumatoid arthritis on methotrexate, here with fall.  History somewhat limited due to mild confusion.  Per report, the patient was found down, with lacerations and contusions to her head.  Is unclear how long she was down.  She is mildly more confused than usual.  Remainder of history is limited as family is not present.  The patient states that her legs just got "tripped up."  However, she also tells me that she is still at home.  She denies any pain other than at her head.  She does report she had some mild arm pain earlier, but this is resolved.  Denies any known recent fevers or chills.  Denies any current chest pain or shortness of breath.  Level 5 caveat invoked as remainder of history, ROS, and physical exam limited due to patient's confusion, AMS.        Past Medical History:  Diagnosis Date  . HLD (hyperlipidemia)   . Hypertension     Patient Active Problem List   Diagnosis Date Noted  . UTI (urinary tract infection) 08/24/2019  . Fall at home, initial encounter 08/24/2019  . Hypokalemia 08/24/2019  . RA (rheumatoid arthritis) ([place]) 08/24/2019  . Fall 08/24/2019  . Facial laceration   . Atrial fibrillation, chronic ([place]) 02/19/2019  . Sepsis ([place]) 08/03/2018  . Atypical pneumonia 08/03/2018  . Arthritis of right hip 08/03/2018  . HTN (hypertension) 08/03/2018  . HLD (hyperlipidemia) 08/03/2018  . AKI (acute kidney injury) ([place]) 09/15/2017  . Symptomatic cholelithiasis 10/13/2014    Past Surgical History:  Procedure Laterality Date  . BACK SURGERY    .  CHOLECYSTECTOMY N/A 10/14/2014   Procedure: LAPAROSCOPIC CHOLECYSTECTOMY;  Surgeon: Marlyce Huge, MD;  Location: [facility] ORS;  Service: General;  Laterality: N/A;  . COLON SURGERY      Prior to Admission medications   Medication Sig Start Date End Date Taking? Authorizing Provider  acetaminophen (TYLENOL) 325 MG tablet Take 650 mg by mouth every 12 (twelve) hours as needed.   Yes [provider]  cholecalciferol (VITAMIN D) 1000 units tablet Take 1,000 Units by mouth daily.   Yes [provider]  diltiazem (TIAZAC) 120 MG 24 hr capsule Take 120 mg by mouth daily. 12/16/18  Yes [provider]  ELIQUIS 5 MG TABS tablet Take 5 mg by mouth every 12 (twelve) hours.   Yes [provider]  folic acid (FOLVITE) 1 MG tablet Take 1 mg by mouth daily.   Yes [provider]  methotrexate (RHEUMATREX) 2.5 MG tablet Take 7 tablets by mouth every Tuesday.    Yes [provider]  multivitamin-lutein (OCUVITE-LUTEIN) CAPS capsule Take 1 capsule by mouth daily.   Yes [provider]  ramipril (ALTACE) 10 MG capsule Take 10 mg by mouth daily.   Yes [provider]  simvastatin (ZOCOR) 20 MG tablet Take 20 mg by mouth daily.    Yes [provider]  vitamin B-12 (CYANOCOBALAMIN) 1000 MCG tablet Take 1,000 mcg by mouth daily.   Yes [provider]  aspirin EC 81 MG tablet Take 81 mg by mouth at bedtime.  [provider]  Multiple Vitamin (MULTIVITAMIN WITH MINERALS) TABS tablet Take 1 tablet by mouth daily.    [provider]    Allergies Patient has no known allergies.  Family History  Problem Relation Age of Onset  . Alzheimer's disease Mother   . Parkinson's disease Mother   . Cancer Father   . Heart disease Sister   . Parkinson's disease Brother   . Heart disease Brother     Social History Social History   Tobacco Use  . Smoking status: Former Research scientist (life sciences)  . Smokeless tobacco: Never Used   Substance Use Topics  . Alcohol use: No  . Drug use: No    Review of Systems  Review of Systems  Unable to perform ROS: Mental status change  Constitutional: Positive for fatigue.  HENT: Positive for facial swelling.   Musculoskeletal: Positive for arthralgias.  Skin: Positive for wound.  Neurological: Positive for weakness.  Psychiatric/Behavioral: Positive for confusion.     ____________________________________________  PHYSICAL EXAM:      VITAL SIGNS: ED Triage Vitals  Enc Vitals Group     BP 08/24/19 0240 (!) 147/119     Pulse Rate 08/24/19 0240 90     Resp 08/24/19 0240 11     Temp 08/24/19 0240 98.3 F (36.8 C)     Temp Source 08/24/19 0240 Oral     SpO2 08/24/19 0240 95 %     Weight 08/24/19 0314 143 lb 4.8 oz (65 kg)     Height 08/24/19 0314 5\' 5"  (1.651 m)     Head Circumference --      Peak Flow --      Pain Score --      Pain Loc --      Pain Edu? --      Excl. in [place]? --      Physical Exam Vitals and nursing note reviewed.  Constitutional:      General: She is not in acute distress.    Appearance: She is well-developed.  HENT:     Head: Normocephalic and atraumatic.     Comments: Superficial skin tears noted under and just lateral to left lower lid margin. No extension to actual eyelid or orbit. Superficial abrasions to left upper forehead.    Mouth/Throat:     Mouth: Mucous membranes are dry.  Eyes:     Conjunctiva/sclera: Conjunctivae normal.  Cardiovascular:     Rate and Rhythm: Regular rhythm. Tachycardia present.     Heart sounds: Normal heart sounds.  Pulmonary:     Effort: Pulmonary effort is normal. No respiratory distress.     Breath sounds: No wheezing.  Abdominal:     General: There is no distension.  Musculoskeletal:     Cervical back: Neck supple.     Comments: Diffuse TTP anterior chest wall. No overt TTP bilateral arms and legs.  Skin:    General: Skin is warm.     Capillary Refill: Capillary refill takes less than 2  seconds.     Findings: No rash.  Neurological:     Mental Status: She is alert and oriented to person, place, and time.     Motor: No abnormal muscle tone.       ____________________________________________   LABS (all labs ordered are listed, but only abnormal results are displayed)  Labs Reviewed  CBC WITH DIFFERENTIAL/PLATELET - Abnormal; Notable for the following components:      Result Value   Hemoglobin 15.4 (*)    HCT 46.3 (*)  MCV 102.4 (*)    MCH 34.1 (*)    All other components within normal limits  BASIC METABOLIC PANEL - Abnormal; Notable for the following components:   Potassium 3.4 (*)    Glucose, Bld 106 (*)    GFR calc non Af Amer 56 (*)    All other components within normal limits  URINALYSIS, COMPLETE (UACMP) WITH MICROSCOPIC - Abnormal; Notable for the following components:   Color, Urine YELLOW (*)    APPearance HAZY (*)    Hgb urine dipstick SMALL (*)    Ketones, ur 20 (*)    Protein, ur 30 (*)    Leukocytes,Ua SMALL (*)    Bacteria, UA RARE (*)    All other components within normal limits  URINE CULTURE  SARS CORONAVIRUS 2 (TAT 6-24 HRS)  CK    ____________________________________________  EKG: Atrial fibrillation, VR 91. QRS 106, QTc 479. Probable RVH and nonspecific TWC.  ________________________________________  RADIOLOGY All imaging, including plain films, CT scans, and ultrasounds, independently reviewed by me, and interpretations confirmed via formal radiology reads.  ED MD interpretation:   CT Head: NAICA CT Spine Cervical: Chronic changes CXR: Interstitial edema  Official radiology report(s): CT Head Wo Contrast  Result Date: 08/24/2019 CLINICAL DATA:  Fall, found down, left frontal swelling/hematoma, left eye bruising/laceration EXAM: CT HEAD WITHOUT CONTRAST CT MAXILLOFACIAL WITHOUT CONTRAST CT CERVICAL SPINE WITHOUT CONTRAST TECHNIQUE: Multidetector CT imaging of the head, cervical spine, and maxillofacial structures were  performed using the standard protocol without intravenous contrast. Multiplanar CT image reconstructions of the cervical spine and maxillofacial structures were also generated. COMPARISON:  CT head/cervical spine dated 02/19/2019 FINDINGS: CT HEAD FINDINGS Brain: No evidence of acute infarction, hemorrhage, hydrocephalus, extra-axial collection or mass lesion/mass effect. Mild cortical atrophy. Extensive subcortical white matter and periventricular small vessel ischemic changes. Vascular: Intracranial atherosclerosis. Skull: Normal. Negative for fracture or focal lesion. Other: Mild soft tissue swelling/hematoma/laceration overlying the left frontal bone (series 2/image 9). CT MAXILLOFACIAL FINDINGS Osseous: No evidence of maxillofacial fracture. Mandible is intact. Bilateral mandibular condyles are well-seated in the TMJs. Orbits: Bilateral orbits, including the globes and retroconal soft tissues, are within normal limits. Sinuses: Partial opacification with layering high density in the left maxillary sinus, likely reflecting inspissated secretions. Visualized paranasal sinuses and mastoid air cells are otherwise clear. Soft tissues: Mild soft tissue swelling/hematoma with laceration overlying the left frontal bone (series 2/image 1). Mild soft tissue swelling/bruising overlying the left maxilla (series 2/image 20). CT CERVICAL SPINE FINDINGS Alignment: Reversal of the normal cervical lordosis. Skull base and vertebrae: No acute fracture. No primary bone lesion or focal pathologic process. Soft tissues and spinal canal: No prevertebral fluid or swelling. No visible canal hematoma. Disc levels: Moderate degenerative changes of the mid/lower cervical spine. Mild narrowing of the spinal canal at C4-5, unchanged. Upper chest: Visualized lung apices are notable for emphysematous changes. Other: Visualized thyroid is unremarkable. IMPRESSION: Mild soft tissue swelling/hematoma/laceration overlying the left frontal bone.  No evidence of calvarial fracture. No evidence of acute intracranial abnormality. Atrophy with small vessel ischemic changes. Mild soft tissue swelling/bruising overlying the left maxilla. No evidence of maxillofacial fracture. Chronic opacity in the left maxillary sinus. No evidence of acute traumatic injury to the cervical spine. Moderate degenerative changes. Mild narrowing of the spinal canal at C4-5, unchanged. Electronically Signed   By: Julian Hy M.D.   On: 08/24/2019 04:03   CT Cervical Spine Wo Contrast  Result Date: 08/24/2019 CLINICAL DATA:  Fall, found down, left  frontal swelling/hematoma, left eye bruising/laceration EXAM: CT HEAD WITHOUT CONTRAST CT MAXILLOFACIAL WITHOUT CONTRAST CT CERVICAL SPINE WITHOUT CONTRAST TECHNIQUE: Multidetector CT imaging of the head, cervical spine, and maxillofacial structures were performed using the standard protocol without intravenous contrast. Multiplanar CT image reconstructions of the cervical spine and maxillofacial structures were also generated. COMPARISON:  CT head/cervical spine dated 02/19/2019 FINDINGS: CT HEAD FINDINGS Brain: No evidence of acute infarction, hemorrhage, hydrocephalus, extra-axial collection or mass lesion/mass effect. Mild cortical atrophy. Extensive subcortical white matter and periventricular small vessel ischemic changes. Vascular: Intracranial atherosclerosis. Skull: Normal. Negative for fracture or focal lesion. Other: Mild soft tissue swelling/hematoma/laceration overlying the left frontal bone (series 2/image 9). CT MAXILLOFACIAL FINDINGS Osseous: No evidence of maxillofacial fracture. Mandible is intact. Bilateral mandibular condyles are well-seated in the TMJs. Orbits: Bilateral orbits, including the globes and retroconal soft tissues, are within normal limits. Sinuses: Partial opacification with layering high density in the left maxillary sinus, likely reflecting inspissated secretions. Visualized paranasal sinuses and  mastoid air cells are otherwise clear. Soft tissues: Mild soft tissue swelling/hematoma with laceration overlying the left frontal bone (series 2/image 1). Mild soft tissue swelling/bruising overlying the left maxilla (series 2/image 20). CT CERVICAL SPINE FINDINGS Alignment: Reversal of the normal cervical lordosis. Skull base and vertebrae: No acute fracture. No primary bone lesion or focal pathologic process. Soft tissues and spinal canal: No prevertebral fluid or swelling. No visible canal hematoma. Disc levels: Moderate degenerative changes of the mid/lower cervical spine. Mild narrowing of the spinal canal at C4-5, unchanged. Upper chest: Visualized lung apices are notable for emphysematous changes. Other: Visualized thyroid is unremarkable. IMPRESSION: Mild soft tissue swelling/hematoma/laceration overlying the left frontal bone. No evidence of calvarial fracture. No evidence of acute intracranial abnormality. Atrophy with small vessel ischemic changes. Mild soft tissue swelling/bruising overlying the left maxilla. No evidence of maxillofacial fracture. Chronic opacity in the left maxillary sinus. No evidence of acute traumatic injury to the cervical spine. Moderate degenerative changes. Mild narrowing of the spinal canal at C4-5, unchanged. Electronically Signed   By: Julian Hy M.D.   On: 08/24/2019 04:03   DG Chest Portable 1 View  Result Date: 08/24/2019 CLINICAL DATA:  Fall with weakness EXAM: PORTABLE CHEST 1 VIEW COMPARISON:  [place] FINDINGS: Chronic interstitial opacity. Normal heart size and mediastinal contours. No visible effusion or pneumothorax. IMPRESSION: 1. No acute finding when compared to prior. 2. Chronic interstitial opacity. Electronically Signed   By: Monte Fantasia M.D.   On: 08/24/2019 04:47   DG Humerus Left  Result Date: 08/24/2019 CLINICAL DATA:  [place]. Left arm pain. EXAM: LEFT HUMERUS - 2+ VIEW COMPARISON:  None. FINDINGS: The shoulder and elbow  joints are grossly maintained. No acute fracture of the humerus is identified. IMPRESSION: No acute bony findings. Electronically Signed   By: Marijo Sanes M.D.   On: 08/24/2019 07:14   DG HIP UNILAT WITH PELVIS 2-3 VIEWS LEFT  Result Date: 08/24/2019 CLINICAL DATA:  [place]. Bilateral hip pain. EXAM: DG HIP (WITH OR WITHOUT PELVIS) 2-3V RIGHT; DG HIP (WITH OR WITHOUT PELVIS) 2-3V LEFT COMPARISON:  09/15/2017 FINDINGS: Both hips are normally located. Stable degenerative changes, right greater than left. No acute hip fracture is identified. The pubic symphysis and SI joints are intact. No definite pelvic fractures. Stable vascular calcifications. IMPRESSION: Stable degenerative changes but no acute hip or pelvic fracture. Electronically Signed   By: Marijo Sanes M.D.   On: 08/24/2019 07:13   DG HIP UNILAT WITH PELVIS 2-3  VIEWS RIGHT  Result Date: 08/24/2019 CLINICAL DATA:  [place]. Bilateral hip pain. EXAM: DG HIP (WITH OR WITHOUT PELVIS) 2-3V RIGHT; DG HIP (WITH OR WITHOUT PELVIS) 2-3V LEFT COMPARISON:  09/15/2017 FINDINGS: Both hips are normally located. Stable degenerative changes, right greater than left. No acute hip fracture is identified. The pubic symphysis and SI joints are intact. No definite pelvic fractures. Stable vascular calcifications. IMPRESSION: Stable degenerative changes but no acute hip or pelvic fracture. Electronically Signed   By: Marijo Sanes M.D.   On: 08/24/2019 07:13   CT Maxillofacial Wo Contrast  Result Date: 08/24/2019 CLINICAL DATA:  Fall, found down, left frontal swelling/hematoma, left eye bruising/laceration EXAM: CT HEAD WITHOUT CONTRAST CT MAXILLOFACIAL WITHOUT CONTRAST CT CERVICAL SPINE WITHOUT CONTRAST TECHNIQUE: Multidetector CT imaging of the head, cervical spine, and maxillofacial structures were performed using the standard protocol without intravenous contrast. Multiplanar CT image reconstructions of the cervical spine and maxillofacial structures were also  generated. COMPARISON:  CT head/cervical spine dated 02/19/2019 FINDINGS: CT HEAD FINDINGS Brain: No evidence of acute infarction, hemorrhage, hydrocephalus, extra-axial collection or mass lesion/mass effect. Mild cortical atrophy. Extensive subcortical white matter and periventricular small vessel ischemic changes. Vascular: Intracranial atherosclerosis. Skull: Normal. Negative for fracture or focal lesion. Other: Mild soft tissue swelling/hematoma/laceration overlying the left frontal bone (series 2/image 9). CT MAXILLOFACIAL FINDINGS Osseous: No evidence of maxillofacial fracture. Mandible is intact. Bilateral mandibular condyles are well-seated in the TMJs. Orbits: Bilateral orbits, including the globes and retroconal soft tissues, are within normal limits. Sinuses: Partial opacification with layering high density in the left maxillary sinus, likely reflecting inspissated secretions. Visualized paranasal sinuses and mastoid air cells are otherwise clear. Soft tissues: Mild soft tissue swelling/hematoma with laceration overlying the left frontal bone (series 2/image 1). Mild soft tissue swelling/bruising overlying the left maxilla (series 2/image 20). CT CERVICAL SPINE FINDINGS Alignment: Reversal of the normal cervical lordosis. Skull base and vertebrae: No acute fracture. No primary bone lesion or focal pathologic process. Soft tissues and spinal canal: No prevertebral fluid or swelling. No visible canal hematoma. Disc levels: Moderate degenerative changes of the mid/lower cervical spine. Mild narrowing of the spinal canal at C4-5, unchanged. Upper chest: Visualized lung apices are notable for emphysematous changes. Other: Visualized thyroid is unremarkable. IMPRESSION: Mild soft tissue swelling/hematoma/laceration overlying the left frontal bone. No evidence of calvarial fracture. No evidence of acute intracranial abnormality. Atrophy with small vessel ischemic changes. Mild soft tissue swelling/bruising  overlying the left maxilla. No evidence of maxillofacial fracture. Chronic opacity in the left maxillary sinus. No evidence of acute traumatic injury to the cervical spine. Moderate degenerative changes. Mild narrowing of the spinal canal at C4-5, unchanged. Electronically Signed   By: Julian Hy M.D.   On: 08/24/2019 04:03    ____________________________________________  PROCEDURES   Procedure(s) performed (including Critical Care):  Marland KitchenMarland KitchenLaceration Repair  Date/Time: 08/24/2019 6:46 AM Performed by: Duffy Bruce, MD Authorized by: Duffy Bruce, MD   Consent:    Consent obtained:  Verbal   Consent given by:  Patient   Risks discussed:  Infection, need for additional repair, pain, tendon damage, retained foreign body, vascular damage, poor cosmetic result, poor wound healing and nerve damage   Alternatives discussed:  Referral and delayed treatment Anesthesia (see MAR for exact dosages):    Anesthesia method:  Local infiltration   Local anesthetic:  Lidocaine 1% WITH epi Laceration details:    Location:  Scalp   Scalp location:  Frontal   Length (cm):  2 Repair type:  Repair type:  Simple Pre-procedure details:    Preparation:  Patient was prepped and draped in usual sterile fashion and imaging obtained to evaluate for foreign bodies Exploration:    Hemostasis achieved with:  Direct pressure   Wound exploration: wound explored through full range of motion and entire depth of wound probed and visualized   Treatment:    Area cleansed with:  Betadine   Amount of cleaning:  Extensive   Irrigation solution:  Sterile water   Irrigation method:  Pressure wash Skin repair:    Repair method:  Steri-Strips Approximation:    Approximation:  Close Post-procedure details:    Dressing:  Antibiotic ointment   Patient tolerance of procedure:  Tolerated well, no immediate complications  .1-3 Lead EKG Interpretation Performed by: Duffy Bruce, MD Authorized by: Duffy Bruce, MD     Interpretation: abnormal     ECG rate:  90-110   ECG rate assessment: tachycardic     Rhythm: atrial fibrillation     Ectopy: none     Conduction: normal   Comments:     Indication: AFib, weakness, heart disease    ____________________________________________  INITIAL IMPRESSION / MDM / ASSESSMENT AND PLAN / ED COURSE  As part of my medical decision making, I reviewed the following data within the [place] notes reviewed and incorporated, Old chart reviewed, Notes from prior ED visits, and [place] Controlled Substance Database       *Arnecia Ector was evaluated in Emergency Department on 08/24/2019 for the symptoms described in the history of present illness. She was evaluated in the context of the global COVID-19 pandemic, which necessitated consideration that the patient might be at risk for infection with the SARS-CoV-2 virus that causes COVID-19. Institutional protocols and algorithms that pertain to the evaluation of patients at risk for COVID-19 are in a state of rapid change based on information released by regulatory bodies including the CDC and federal and state organizations. These policies and algorithms were followed during the patient's care in the ED.  Some ED evaluations and interventions may be delayed as a result of limited staffing during the pandemic.*     Medical Decision Making:  85 yo F here with generalized weakness, fall. Suspect mild dehydration, UTI with subsequent fall. [place] repaired. No traumatic injury on imaging. Pt has likely UA on lab work and is on methotrexate. Discussed with daughter who confirms pt is weaker, more drowsy and confused than usual. Will admit for ABX, IVF, monitoring.    ____________________________________________  FINAL CLINICAL IMPRESSION(S) / ED DIAGNOSES  Final diagnoses:  Facial laceration, initial encounter  Fall, initial encounter  Acute cystitis without hematuria  Encephalopathy      MEDICATIONS GIVEN DURING THIS VISIT:  Medications  ondansetron (ZOFRAN) injection 4 mg (has no administration in time range)  hydrALAZINE (APRESOLINE) injection 5 mg (has no administration in time range)  acetaminophen (TYLENOL) tablet 650 mg (has no administration in time range)  cefTRIAXone (ROCEPHIN) 2 g in sodium chloride 0.9 % 100 mL IVPB (has no administration in time range)  Tdap (BOOSTRIX) injection 0.5 mL (0.5 mLs Intramuscular Given 08/24/19 0417)  cefTRIAXone (ROCEPHIN) 2 g in sodium chloride 0.9 % 100 mL IVPB (0 g Intravenous Stopped 08/24/19 0540)  sodium chloride 0.9 % bolus 1,000 mL (0 mLs Intravenous Stopped 08/24/19 0600)  lidocaine-EPINEPHrine (XYLOCAINE W/EPI) 2 %-1:100000 (with pres) injection 20 mL (10 mLs Intradermal Given by Other 08/24/19 0630)  diltiazem (CARDIZEM CD) 24 hr capsule 120 mg (  120 mg Oral Given 08/24/19 0740)  acetaminophen (TYLENOL) tablet 1,000 mg (1,000 mg Oral Given 08/24/19 0741)  potassium chloride SA (KLOR-CON) CR tablet 30 mEq (30 mEq Oral Given 08/24/19 0847)     ED Discharge Orders    None       Note:  This document was prepared using Dragon voice recognition software and may include unintentional dictation errors.   Duffy Bruce, MD 08/24/19 [phone]

## 2019-08-24 NOTE — ED Notes (Signed)
Pt urinated into brief. Brief changed. Pt straightened in bed.

## 2019-08-24 NOTE — Evaluation (Signed)
Physical Therapy Evaluation Patient Details Name: Angela Leonard MRN: [id number] DOB: 12-09-1934 Today's Date: 08/24/2019   History of Present Illness  85 y.o. female with medical history significant of hypertension, hyperlipidemia, atrial fibrillation on Eliquis, mild dementia with sundowning phenomena, rheumatoid arthritis on methotrexate, who presents with fall.  Pt with significant L facial bruising with acute laceration on subacute bruising.  Clinical Impression  Pt with very limited mobility secondary to c/o pain with all tasks.  Nursing reports that she was screaming out in pain during a clean up/linen change earlier and she similarly screamed out with any attempt at mobility today and did not tolerate LE exercises/movement well at all.  Apparently pt is able to do some ambulation, (relatively independently?) in the home with assist but at this point even trying to sit up would be a Herculean effort given her pain reaction.  Pt not appropriate of safe to go home, will need STR.       Follow Up Recommendations SNF;Supervision/Assistance - 24 hour    Equipment Recommendations  None recommended by PT    Recommendations for Other Services       Precautions / Restrictions Precautions Precautions: Fall Restrictions Weight Bearing Restrictions: No      Mobility  Bed Mobility Overal bed mobility: Needs Assistance Bed Mobility: Supine to Sit     Supine to sit: Total assist     General bed mobility comments: unable to assume sitting position.  PT attempted to assist to sitting from both sides of bed to try and mitigate discomfort during transitions but pt calling out in pain with all attempts to get to sitting - unable to assist at all and could not tolerate assist from PT despite multiple hand positions and strategies  Transfers                 General transfer comment: unable to attain sitting EOB, not tested  Ambulation/Gait                Stairs             Wheelchair Mobility    Modified Rankin (Stroke Patients Only)       Balance Overall balance assessment: History of Falls(unable to attain upright - unable to test, likely very poor)                                           Pertinent Vitals/Pain Pain Assessment: Faces Faces Pain Scale: Hurts whole lot Pain Location: c/o pain with any palpation, specifically in shoulder trunk area    Home Living Family/patient expects to be discharged to:: Private residence Living Arrangements: Children Available Help at Discharge: Family Type of Home: House Home Access: Level entry;Stairs to enter   Entrance Stairs-Number of Steps: 1 Home Layout: One level Home Equipment: Environmental consultant - 2 wheels;Toilet riser;Shower seat Additional Comments: gathered from previous notes, pt unable to answer most questions    Prior Function           Comments: per previous notes it appears that pt is mobile in the home, can take self to bathroom etc, unable to answer how much she actually does otherwise     Hand Dominance        Extremity/Trunk Assessment   Upper Extremity Assessment Upper Extremity Assessment: (very pain limited, unwilling to do much at all with UEs)    Lower Extremity Assessment  Lower Extremity Assessment: Generalized weakness(grossly 3-/5, able to give some effort but limited)       Communication   Communication: No difficulties  Cognition Arousal/Alertness: Awake/alert Behavior During Therapy: WFL for tasks assessed/performed Overall Cognitive Status: Difficult to assess                                 General Comments: per notes pt with baseline dementia, more confused recently.  Pt only oriented to self at time of eval      General Comments General comments (skin integrity, edema, etc.): Pt with extensive bruising     Exercises General Exercises - Lower Extremity Ankle Circles/Pumps: PROM;AROM;5 reps;Both Heel Slides: AROM;AAROM;5  reps;Both Hip ABduction/ADduction: AROM;AAROM;5 reps;Both   Assessment/Plan    PT Assessment Patient needs continued PT services  PT Problem List Decreased strength;Decreased range of motion;Decreased activity tolerance;Decreased balance;Decreased mobility;Decreased coordination;Decreased knowledge of use of DME;Decreased cognition;Decreased safety awareness;Pain       PT Treatment Interventions DME instruction;Gait training;Stair training;Functional mobility training;Therapeutic activities;Therapeutic exercise;Balance training;Neuromuscular re-education;Cognitive remediation;Patient/family education    PT Goals (Current goals can be found in the Care Plan section)  Acute Rehab PT Goals Patient Stated Goal: go home PT Goal Formulation: With patient Time For Goal Achievement: 09/07/19 Potential to Achieve Goals: Fair    Frequency Min 2X/week   Barriers to discharge        Co-evaluation               AM-PAC PT "6 Clicks" Mobility  Outcome Measure Help needed turning from your back to your side while in a flat bed without using bedrails?: Total Help needed moving from lying on your back to sitting on the side of a flat bed without using bedrails?: Total Help needed moving to and from a bed to a chair (including a wheelchair)?: Total Help needed standing up from a chair using your arms (e.g., wheelchair or bedside chair)?: Total Help needed to walk in hospital room?: Total Help needed climbing 3-5 steps with a railing? : Total 6 Click Score: 6    End of Session   Activity Tolerance: Patient limited by pain Patient left: with call Giuffre/phone within reach;with bed alarm set Nurse Communication: Mobility status PT Visit Diagnosis: Muscle weakness (generalized) (M62.81);Difficulty in walking, not elsewhere classified (R26.2);Pain Pain - Right/Left: Left Pain - part of body: Shoulder    Time: 7564-3329 PT Time Calculation (min) (ACUTE ONLY): 28 min   Charges:   PT  Evaluation $PT Eval Low Complexity: 1 Low PT Treatments $Therapeutic Exercise: 8-22 mins        Kreg Shropshire, DPT 08/24/2019, 4:41 PM

## 2019-08-24 NOTE — ED Notes (Signed)
Brief and chuck placed on pt at this time.

## 2019-08-24 NOTE — H&P (Signed)
History and Physical    Angela Leonard XBM:[id number] DOB: 1935/04/04 DOA: 08/24/2019  Referring MD/NP/PA:   PCP: Derinda Late, MD   Patient coming from:  The patient is coming from home.  At baseline, pt is independent for most of ADL.        Chief Complaint: fall  HPI: Angela Leonard is a 85 y.o. female with medical history significant of hypertension, hyperlipidemia, atrial fibrillation on Eliquis, mild dementia with sundowning phenomena, rheumatoid arthritis on methotrexate, who presents with fall.  Per patient's daughter (I called her daughter by phone), patient has history of dementia, at her normal baseline, patient recognize family most of the time, but often be confused about place and time.  In the past week, patient is a little more confused than the baseline. Patient fell twice last night.  Although it not witnessed, but her daughter thinks  that most likely patient did not pass out.    Pt has pain in right upper arm. Her left eye has has bruise and laceration which is fixed by EDP. Left frontal area is swollen with mild bleeding. Per daughter, sometimes patient has mild cough, no chest pain, fever or chills.  Patient does not have nausea, vomiting, diarrhea or abdominal pain.  Patient reported to ED physician that she has mild dysuria and increased urinary frequency. Pt had 1st covid vaccine 3/29.  In ED, pt has positive orthostatic sign, laying blood pressure 140/60 and sitting blood pressure 100/52.  Patient was given 1 L normal saline bolus.  ED Course: pt was found to have WBC 8.8, urinalysis (hazy appearance, small amount of leukocyte, rare bacteria, WBC 11-20), pending COVID-19 PCR, CK 79, potassium 3.4, renal function normal.  Temperature normal, blood pressure 152/109, 119/103, heart rate 95, oxygen saturation 95% on room air.  Chest x-ray negative.  CT of her head is negative for acute intracranial abnormalities, but showed hematoma in the left frontal area.  CT of C-spine  is negative for bony fracture, but showed degenerative disc disease.  CT of her maxillofacial images negative for bony fracture. No bony fracture by x-ray of left hip/pelvis, x-ray of left humerus and x-ray of right hip. Pt is placed on med-surg bed for obs  Review of Systems:   General: no fevers, chills, no body weight gain, has fatigue HEENT: no blurry vision, hearing changes or sore throat Respiratory: no dyspnea, coughing, wheezing CV: no chest pain, no palpitations GI: no nausea, vomiting, abdominal pain, diarrhea, constipation GU: has dysuria, burning on urination, increased urinary frequency, no hematuria  Ext: no leg edema Neuro: no unilateral weakness, numbness, or tingling, no vision change or hearing loss. Has fall. Skin: no rash. Has skin laceration to left lower lateral eye lid area. Has bruise to left upper forehead area. MSK: No muscle spasm, no deformity, no limitation of range of movement in spin Heme: No easy bruising.  Travel history: No recent long distant travel.  Allergy: No Known Allergies  Past Medical History:  Diagnosis Date  . HLD (hyperlipidemia)   . Hypertension     Past Surgical History:  Procedure Laterality Date  . BACK SURGERY    . CHOLECYSTECTOMY N/A 10/14/2014   Procedure: LAPAROSCOPIC CHOLECYSTECTOMY;  Surgeon: Marlyce Huge, MD;  Location: [facility] ORS;  Service: General;  Laterality: N/A;  . COLON SURGERY      Social History:  reports that she has quit smoking. She has never used smokeless tobacco. She reports that she does not drink alcohol or use drugs.  Family  History:  Family History  Problem Relation Age of Onset  . Alzheimer's disease Mother   . Parkinson's disease Mother   . Cancer Father   . Heart disease Sister   . Parkinson's disease Brother   . Heart disease Brother      Prior to Admission medications   Medication Sig Start Date End Date Taking? Authorizing Provider  acetaminophen (TYLENOL) 325 MG tablet Take 650 mg  by mouth every 12 (twelve) hours as needed.   Yes [provider]  cholecalciferol (VITAMIN D) 1000 units tablet Take 1,000 Units by mouth daily.   Yes [provider]  diltiazem (TIAZAC) 120 MG 24 hr capsule Take 120 mg by mouth daily. 12/16/18  Yes [provider]  ELIQUIS 5 MG TABS tablet Take 5 mg by mouth every 12 (twelve) hours.   Yes [provider]  folic acid (FOLVITE) 1 MG tablet Take 1 mg by mouth daily.   Yes [provider]  methotrexate (RHEUMATREX) 2.5 MG tablet Take 7 tablets by mouth every Tuesday.    Yes [provider]  multivitamin-lutein (OCUVITE-LUTEIN) CAPS capsule Take 1 capsule by mouth daily.   Yes [provider]  ramipril (ALTACE) 10 MG capsule Take 10 mg by mouth daily.   Yes [provider]  simvastatin (ZOCOR) 20 MG tablet Take 20 mg by mouth daily.    Yes [provider]  vitamin B-12 (CYANOCOBALAMIN) 1000 MCG tablet Take 1,000 mcg by mouth daily.   Yes [provider]  aspirin EC 81 MG tablet Take 81 mg by mouth at bedtime.    [provider]  Multiple Vitamin (MULTIVITAMIN WITH MINERALS) TABS tablet Take 1 tablet by mouth daily.    [provider]    Physical Exam: Vitals:   08/24/19 0630 08/24/19 0730 08/24/19 0740 08/24/19 0800  BP: (!) 142/121 (!) 140/91 (!) 140/91 (!) 118/95  Pulse:      Resp: (!) 21 15  18   Temp: 98.7 F (37.1 C)     TempSrc: Oral     SpO2: 95%     Weight:      Height:       General: Not in acute distress HEENT:       Eyes: PERRL, EOMI, no scleral icterus.       ENT: No discharge from the ears and nose, no pharynx injection, no tonsillar enlargement.        Neck: No JVD, no bruit, no mass felt. Heme: No neck lymph node enlargement. Cardiac: S1/S2, RRR, No murmurs, No gallops or rubs. Respiratory: No rales, wheezing, rhonchi or rubs. GI: Soft, nondistended, nontender, no rebound pain, no organomegaly, BS present. GU: No  hematuria Ext: No pitting leg edema bilaterally. 2+DP/PT pulse bilaterally. Musculoskeletal: has tenderness to right upper arm and left hip areas. Skin: has skin laceration to left lower lateral eye lid area. Has bruise to left upper forehead area. Neuro: Alert, mild confused, cranial nerves II-XII grossly intact, moves all extremities  Psych: Patient is not psychotic, no suicidal or hemocidal ideation.  Labs on Admission: I have personally reviewed following labs and imaging studies  CBC: Recent Labs  Lab 08/24/19 0330  WBC 8.8  NEUTROABS 6.7  HGB 15.4*  HCT 46.3*  MCV 102.4*  PLT 270   Basic Metabolic Panel: Recent Labs  Lab 08/24/19 0330  NA 140  K 3.4*  CL 103  CO2 24  GLUCOSE 106*  BUN 17  CREATININE 0.94  CALCIUM 9.5  GFR: Estimated Creatinine Clearance: 41.5 mL/min (by C-G formula based on SCr of 0.94 mg/dL). Liver Function Tests: No results for input(s): AST, ALT, ALKPHOS, BILITOT, PROT, ALBUMIN in the last 168 hours. No results for input(s): LIPASE, AMYLASE in the last 168 hours. No results for input(s): AMMONIA in the last 168 hours. Coagulation Profile: No results for input(s): INR, PROTIME in the last 168 hours. Cardiac Enzymes: Recent Labs  Lab 08/24/19 0330  CKTOTAL 79   BNP (last 3 results) No results for input(s): PROBNP in the last 8760 hours. HbA1C: No results for input(s): HGBA1C in the last 72 hours. CBG: No results for input(s): GLUCAP in the last 168 hours. Lipid Profile: No results for input(s): CHOL, HDL, LDLCALC, TRIG, CHOLHDL, LDLDIRECT in the last 72 hours. Thyroid Function Tests: No results for input(s): TSH, T4TOTAL, FREET4, T3FREE, THYROIDAB in the last 72 hours. Anemia Panel: No results for input(s): VITAMINB12, FOLATE, FERRITIN, TIBC, IRON, RETICCTPCT in the last 72 hours. Urine analysis:    Component Value Date/Time   COLORURINE YELLOW (A) 08/24/2019 0330   APPEARANCEUR HAZY (A) 08/24/2019 0330   LABSPEC 1.021  08/24/2019 0330   PHURINE 5.0 08/24/2019 0330   GLUCOSEU NEGATIVE 08/24/2019 0330   HGBUR SMALL (A) 08/24/2019 0330   BILIRUBINUR NEGATIVE 08/24/2019 0330   KETONESUR 20 (A) 08/24/2019 0330   PROTEINUR 30 (A) 08/24/2019 0330   NITRITE NEGATIVE 08/24/2019 0330   LEUKOCYTESUR SMALL (A) 08/24/2019 0330   Sepsis Labs: @LABRCNTIP (procalcitonin:4,lacticidven:4) )No results found for this or any previous visit (from the past 240 hour(s)).   Radiological Exams on Admission: CT Head Wo Contrast  Result Date: 08/24/2019 CLINICAL DATA:  Fall, found down, left frontal swelling/hematoma, left eye bruising/laceration EXAM: CT HEAD WITHOUT CONTRAST CT MAXILLOFACIAL WITHOUT CONTRAST CT CERVICAL SPINE WITHOUT CONTRAST TECHNIQUE: Multidetector CT imaging of the head, cervical spine, and maxillofacial structures were performed using the standard protocol without intravenous contrast. Multiplanar CT image reconstructions of the cervical spine and maxillofacial structures were also generated. COMPARISON:  CT head/cervical spine dated 02/19/2019 FINDINGS: CT HEAD FINDINGS Brain: No evidence of acute infarction, hemorrhage, hydrocephalus, extra-axial collection or mass lesion/mass effect. Mild cortical atrophy. Extensive subcortical white matter and periventricular small vessel ischemic changes. Vascular: Intracranial atherosclerosis. Skull: Normal. Negative for fracture or focal lesion. Other: Mild soft tissue swelling/hematoma/laceration overlying the left frontal bone (series 2/image 9). CT MAXILLOFACIAL FINDINGS Osseous: No evidence of maxillofacial fracture. Mandible is intact. Bilateral mandibular condyles are well-seated in the TMJs. Orbits: Bilateral orbits, including the globes and retroconal soft tissues, are within normal limits. Sinuses: Partial opacification with layering high density in the left maxillary sinus, likely reflecting inspissated secretions. Visualized paranasal sinuses and mastoid air cells are  otherwise clear. Soft tissues: Mild soft tissue swelling/hematoma with laceration overlying the left frontal bone (series 2/image 1). Mild soft tissue swelling/bruising overlying the left maxilla (series 2/image 20). CT CERVICAL SPINE FINDINGS Alignment: Reversal of the normal cervical lordosis. Skull base and vertebrae: No acute fracture. No primary bone lesion or focal pathologic process. Soft tissues and spinal canal: No prevertebral fluid or swelling. No visible canal hematoma. Disc levels: Moderate degenerative changes of the mid/lower cervical spine. Mild narrowing of the spinal canal at C4-5, unchanged. Upper chest: Visualized lung apices are notable for emphysematous changes. Other: Visualized thyroid is unremarkable. IMPRESSION: Mild soft tissue swelling/hematoma/laceration overlying the left frontal bone. No evidence of calvarial fracture. No evidence of acute intracranial abnormality. Atrophy with small vessel ischemic changes. Mild soft tissue swelling/bruising overlying the left  maxilla. No evidence of maxillofacial fracture. Chronic opacity in the left maxillary sinus. No evidence of acute traumatic injury to the cervical spine. Moderate degenerative changes. Mild narrowing of the spinal canal at C4-5, unchanged. Electronically Signed   By: Julian Hy M.D.   On: 08/24/2019 04:03   CT Cervical Spine Wo Contrast  Result Date: 08/24/2019 CLINICAL DATA:  Fall, found down, left frontal swelling/hematoma, left eye bruising/laceration EXAM: CT HEAD WITHOUT CONTRAST CT MAXILLOFACIAL WITHOUT CONTRAST CT CERVICAL SPINE WITHOUT CONTRAST TECHNIQUE: Multidetector CT imaging of the head, cervical spine, and maxillofacial structures were performed using the standard protocol without intravenous contrast. Multiplanar CT image reconstructions of the cervical spine and maxillofacial structures were also generated. COMPARISON:  CT head/cervical spine dated 02/19/2019 FINDINGS: CT HEAD FINDINGS Brain: No  evidence of acute infarction, hemorrhage, hydrocephalus, extra-axial collection or mass lesion/mass effect. Mild cortical atrophy. Extensive subcortical white matter and periventricular small vessel ischemic changes. Vascular: Intracranial atherosclerosis. Skull: Normal. Negative for fracture or focal lesion. Other: Mild soft tissue swelling/hematoma/laceration overlying the left frontal bone (series 2/image 9). CT MAXILLOFACIAL FINDINGS Osseous: No evidence of maxillofacial fracture. Mandible is intact. Bilateral mandibular condyles are well-seated in the TMJs. Orbits: Bilateral orbits, including the globes and retroconal soft tissues, are within normal limits. Sinuses: Partial opacification with layering high density in the left maxillary sinus, likely reflecting inspissated secretions. Visualized paranasal sinuses and mastoid air cells are otherwise clear. Soft tissues: Mild soft tissue swelling/hematoma with laceration overlying the left frontal bone (series 2/image 1). Mild soft tissue swelling/bruising overlying the left maxilla (series 2/image 20). CT CERVICAL SPINE FINDINGS Alignment: Reversal of the normal cervical lordosis. Skull base and vertebrae: No acute fracture. No primary bone lesion or focal pathologic process. Soft tissues and spinal canal: No prevertebral fluid or swelling. No visible canal hematoma. Disc levels: Moderate degenerative changes of the mid/lower cervical spine. Mild narrowing of the spinal canal at C4-5, unchanged. Upper chest: Visualized lung apices are notable for emphysematous changes. Other: Visualized thyroid is unremarkable. IMPRESSION: Mild soft tissue swelling/hematoma/laceration overlying the left frontal bone. No evidence of calvarial fracture. No evidence of acute intracranial abnormality. Atrophy with small vessel ischemic changes. Mild soft tissue swelling/bruising overlying the left maxilla. No evidence of maxillofacial fracture. Chronic opacity in the left maxillary  sinus. No evidence of acute traumatic injury to the cervical spine. Moderate degenerative changes. Mild narrowing of the spinal canal at C4-5, unchanged. Electronically Signed   By: Julian Hy M.D.   On: 08/24/2019 04:03   DG Chest Portable 1 View  Result Date: 08/24/2019 CLINICAL DATA:  Fall with weakness EXAM: PORTABLE CHEST 1 VIEW COMPARISON:  [place] FINDINGS: Chronic interstitial opacity. Normal heart size and mediastinal contours. No visible effusion or pneumothorax. IMPRESSION: 1. No acute finding when compared to prior. 2. Chronic interstitial opacity. Electronically Signed   By: Monte Fantasia M.D.   On: 08/24/2019 04:47   DG Humerus Left  Result Date: 08/24/2019 CLINICAL DATA:  [place]. Left arm pain. EXAM: LEFT HUMERUS - 2+ VIEW COMPARISON:  None. FINDINGS: The shoulder and elbow joints are grossly maintained. No acute fracture of the humerus is identified. IMPRESSION: No acute bony findings. Electronically Signed   By: Marijo Sanes M.D.   On: 08/24/2019 07:14   DG HIP UNILAT WITH PELVIS 2-3 VIEWS LEFT  Result Date: 08/24/2019 CLINICAL DATA:  [place]. Bilateral hip pain. EXAM: DG HIP (WITH OR WITHOUT PELVIS) 2-3V RIGHT; DG HIP (WITH OR WITHOUT PELVIS) 2-3V LEFT COMPARISON:  09/15/2017  FINDINGS: Both hips are normally located. Stable degenerative changes, right greater than left. No acute hip fracture is identified. The pubic symphysis and SI joints are intact. No definite pelvic fractures. Stable vascular calcifications. IMPRESSION: Stable degenerative changes but no acute hip or pelvic fracture. Electronically Signed   By: Marijo Sanes M.D.   On: 08/24/2019 07:13   DG HIP UNILAT WITH PELVIS 2-3 VIEWS RIGHT  Result Date: 08/24/2019 CLINICAL DATA:  [place]. Bilateral hip pain. EXAM: DG HIP (WITH OR WITHOUT PELVIS) 2-3V RIGHT; DG HIP (WITH OR WITHOUT PELVIS) 2-3V LEFT COMPARISON:  09/15/2017 FINDINGS: Both hips are normally located. Stable degenerative changes, right  greater than left. No acute hip fracture is identified. The pubic symphysis and SI joints are intact. No definite pelvic fractures. Stable vascular calcifications. IMPRESSION: Stable degenerative changes but no acute hip or pelvic fracture. Electronically Signed   By: Marijo Sanes M.D.   On: 08/24/2019 07:13   CT Maxillofacial Wo Contrast  Result Date: 08/24/2019 CLINICAL DATA:  Fall, found down, left frontal swelling/hematoma, left eye bruising/laceration EXAM: CT HEAD WITHOUT CONTRAST CT MAXILLOFACIAL WITHOUT CONTRAST CT CERVICAL SPINE WITHOUT CONTRAST TECHNIQUE: Multidetector CT imaging of the head, cervical spine, and maxillofacial structures were performed using the standard protocol without intravenous contrast. Multiplanar CT image reconstructions of the cervical spine and maxillofacial structures were also generated. COMPARISON:  CT head/cervical spine dated 02/19/2019 FINDINGS: CT HEAD FINDINGS Brain: No evidence of acute infarction, hemorrhage, hydrocephalus, extra-axial collection or mass lesion/mass effect. Mild cortical atrophy. Extensive subcortical white matter and periventricular small vessel ischemic changes. Vascular: Intracranial atherosclerosis. Skull: Normal. Negative for fracture or focal lesion. Other: Mild soft tissue swelling/hematoma/laceration overlying the left frontal bone (series 2/image 9). CT MAXILLOFACIAL FINDINGS Osseous: No evidence of maxillofacial fracture. Mandible is intact. Bilateral mandibular condyles are well-seated in the TMJs. Orbits: Bilateral orbits, including the globes and retroconal soft tissues, are within normal limits. Sinuses: Partial opacification with layering high density in the left maxillary sinus, likely reflecting inspissated secretions. Visualized paranasal sinuses and mastoid air cells are otherwise clear. Soft tissues: Mild soft tissue swelling/hematoma with laceration overlying the left frontal bone (series 2/image 1). Mild soft tissue  swelling/bruising overlying the left maxilla (series 2/image 20). CT CERVICAL SPINE FINDINGS Alignment: Reversal of the normal cervical lordosis. Skull base and vertebrae: No acute fracture. No primary bone lesion or focal pathologic process. Soft tissues and spinal canal: No prevertebral fluid or swelling. No visible canal hematoma. Disc levels: Moderate degenerative changes of the mid/lower cervical spine. Mild narrowing of the spinal canal at C4-5, unchanged. Upper chest: Visualized lung apices are notable for emphysematous changes. Other: Visualized thyroid is unremarkable. IMPRESSION: Mild soft tissue swelling/hematoma/laceration overlying the left frontal bone. No evidence of calvarial fracture. No evidence of acute intracranial abnormality. Atrophy with small vessel ischemic changes. Mild soft tissue swelling/bruising overlying the left maxilla. No evidence of maxillofacial fracture. Chronic opacity in the left maxillary sinus. No evidence of acute traumatic injury to the cervical spine. Moderate degenerative changes. Mild narrowing of the spinal canal at C4-5, unchanged. Electronically Signed   By: Julian Hy M.D.   On: 08/24/2019 04:03     EKG: Independently reviewed.  Atrial fibrillation, QTc 479, nonspecific T wave change  Assessment/Plan Principal Problem:   Fall at home, initial encounter Active Problems:   HTN (hypertension)   HLD (hyperlipidemia)   Atrial fibrillation, chronic (HCC)   UTI (urinary tract infection)   Hypokalemia   RA (rheumatoid arthritis) ([place])   Fall  Facial laceration   Fall at home, initial encounter and facial laceration: Possibly due to orthostatic status.  Patient has positive orthostatic hypotension in ED.  CT-head and CT of C spin negative for acute issues. X-ray of left hip/pelvis, right hip, left humerus are negative for bony fracture. No focal neuro deficits on physical examination.  -will place MedSurg bed of observation -IV fluid: 1 L normal  saline bolus, followed up at 75 cc/h -PT/OT -Fall precaution - consult to wound care  HTN:  -Continue home medications: Cardizem, ramipril -hydralazine prn  HLD (hyperlipidemia): -zocor  Atrial fibrillation, chronic ([place]): CHA2DS2-VASc Score is 4, needs oral anticoagulation. Patient is on Eliquis at home. Heart rate is 90-110s. -continue Diltiazem -hold Eliquis due to fall and bleeding  UTI (urinary tract infection): -rocephine -f/u Urine culture  Hypokalemia: K= 3.4 on admission. - Repleted - Check Mg level  RA (rheumatoid arthritis) ([place]): -Methotrexate   DVT ppx: SCD Code Status: Full code per her daughter Family Communication: Yes, patient's daughter phone Disposition Plan: to be determined Consults called:  none Admission status: Med-surg bed for obs     Date of Service 08/24/2019    [facility]   If 7PM-7AM, please contact night-coverage [email or web address] 08/24/2019, 8:20 AM

## 2019-08-25 DIAGNOSIS — Z23 Encounter for immunization: Secondary | ICD-10-CM | POA: Diagnosis present

## 2019-08-25 DIAGNOSIS — E785 Hyperlipidemia, unspecified: Secondary | ICD-10-CM | POA: Diagnosis present

## 2019-08-25 DIAGNOSIS — I482 Chronic atrial fibrillation, unspecified: Secondary | ICD-10-CM | POA: Diagnosis present

## 2019-08-25 DIAGNOSIS — W19XXXA Unspecified fall, initial encounter: Secondary | ICD-10-CM | POA: Diagnosis present

## 2019-08-25 DIAGNOSIS — I951 Orthostatic hypotension: Secondary | ICD-10-CM | POA: Diagnosis present

## 2019-08-25 DIAGNOSIS — S0181XA Laceration without foreign body of other part of head, initial encounter: Secondary | ICD-10-CM

## 2019-08-25 DIAGNOSIS — E876 Hypokalemia: Secondary | ICD-10-CM

## 2019-08-25 DIAGNOSIS — N3000 Acute cystitis without hematuria: Secondary | ICD-10-CM | POA: Diagnosis present

## 2019-08-25 DIAGNOSIS — Z20822 Contact with and (suspected) exposure to covid-19: Secondary | ICD-10-CM | POA: Diagnosis present

## 2019-08-25 DIAGNOSIS — Z79899 Other long term (current) drug therapy: Secondary | ICD-10-CM | POA: Diagnosis not present

## 2019-08-25 DIAGNOSIS — Z7901 Long term (current) use of anticoagulants: Secondary | ICD-10-CM | POA: Diagnosis not present

## 2019-08-25 DIAGNOSIS — F0390 Unspecified dementia without behavioral disturbance: Secondary | ICD-10-CM | POA: Diagnosis present

## 2019-08-25 DIAGNOSIS — Z7982 Long term (current) use of aspirin: Secondary | ICD-10-CM | POA: Diagnosis not present

## 2019-08-25 DIAGNOSIS — E860 Dehydration: Secondary | ICD-10-CM | POA: Diagnosis present

## 2019-08-25 DIAGNOSIS — I10 Essential (primary) hypertension: Secondary | ICD-10-CM

## 2019-08-25 DIAGNOSIS — M160 Bilateral primary osteoarthritis of hip: Secondary | ICD-10-CM | POA: Diagnosis present

## 2019-08-25 DIAGNOSIS — Z87891 Personal history of nicotine dependence: Secondary | ICD-10-CM | POA: Diagnosis not present

## 2019-08-25 DIAGNOSIS — M069 Rheumatoid arthritis, unspecified: Secondary | ICD-10-CM

## 2019-08-25 DIAGNOSIS — Z8249 Family history of ischemic heart disease and other diseases of the circulatory system: Secondary | ICD-10-CM | POA: Diagnosis not present

## 2019-08-25 DIAGNOSIS — M79621 Pain in right upper arm: Secondary | ICD-10-CM | POA: Diagnosis present

## 2019-08-25 DIAGNOSIS — Y92009 Unspecified place in unspecified non-institutional (private) residence as the place of occurrence of the external cause: Secondary | ICD-10-CM

## 2019-08-25 DIAGNOSIS — D7589 Other specified diseases of blood and blood-forming organs: Secondary | ICD-10-CM | POA: Diagnosis present

## 2019-08-25 DIAGNOSIS — R300 Dysuria: Secondary | ICD-10-CM | POA: Diagnosis present

## 2019-08-25 LAB — CBC
HCT: 43.2 % (ref 36.0–46.0)
Hemoglobin: 14.8 g/dL (ref 12.0–15.0)
MCH: 34.6 pg — ABNORMAL HIGH (ref 26.0–34.0)
MCHC: 34.3 g/dL (ref 30.0–36.0)
MCV: 100.9 fL — ABNORMAL HIGH (ref 80.0–100.0)
Platelets: 226 10*3/uL (ref 150–400)
RBC: 4.28 MIL/uL (ref 3.87–5.11)
RDW: 14.2 % (ref 11.5–15.5)
WBC: 7.8 10*3/uL (ref 4.0–10.5)
nRBC: 0 % (ref 0.0–0.2)

## 2019-08-25 LAB — BASIC METABOLIC PANEL
Anion gap: 6 (ref 5–15)
BUN: 15 mg/dL (ref 8–23)
CO2: 24 mmol/L (ref 22–32)
Calcium: 8.7 mg/dL — ABNORMAL LOW (ref 8.9–10.3)
Chloride: 112 mmol/L — ABNORMAL HIGH (ref 98–111)
Creatinine, Ser: 0.71 mg/dL (ref 0.44–1.00)
GFR calc Af Amer: >60 mL/min (ref >60)
GFR calc non Af Amer: >60 mL/min (ref >60)
Glucose, Bld: 96 mg/dL (ref 70–99)
Potassium: 3.9 mmol/L (ref 3.5–5.1)
Sodium: 142 mmol/L (ref 135–145)

## 2019-08-25 LAB — URINE CULTURE
Culture: <10000 — AB
Special Requests: NORMAL

## 2019-08-25 LAB — MAGNESIUM: Magnesium: 1.5 mg/dL — ABNORMAL LOW (ref 1.7–2.4)

## 2019-08-25 LAB — GLUCOSE, CAPILLARY: Glucose-Capillary: 94 mg/dL (ref 70–99)

## 2019-08-25 MED ORDER — MAGNESIUM SULFATE 2 GM/50ML IV SOLN
2.0000 g | Freq: Once | INTRAVENOUS | Status: AC
  Administered 2019-08-25: 18:00:00 2 g via INTRAVENOUS
  Filled 2019-08-25: qty 50

## 2019-08-25 MED ORDER — MELATONIN 5 MG PO TABS
2.5000 mg | ORAL_TABLET | Freq: Every day | ORAL | Status: DC
  Administered 2019-08-25 – 2019-08-26 (×2): 2.5 mg via ORAL
  Filled 2019-08-25 (×3): qty 0.5

## 2019-08-25 MED ORDER — APIXABAN 5 MG PO TABS
5.0000 mg | ORAL_TABLET | Freq: Two times a day (BID) | ORAL | Status: DC
  Administered 2019-08-25 – 2019-08-27 (×4): 5 mg via ORAL
  Filled 2019-08-25 (×4): qty 1

## 2019-08-25 NOTE — TOC Initial Note (Signed)
Transition of Care [facility]) - Initial/Assessment Note    Patient Details  Name: Angela Leonard MRN: [id number] Date of Birth: May 09, 1935  [facility] CM/SW Contact:    Shelbie Ammons, RN Phone Number: 08/25/2019, 3:38 PM  Clinical Narrative:   RNCM placed call to patient's daughter [name] early in day to discuss that they are recommending patient go to SNF however with her observation status this is not something Medicare will pay for. Daughter reports that it will be her plan to bring patient home unless she is not able to get up and do for herself and then she can't take care of her as she is on a walker herself.  RNCM messaged OT, MD and bedside nurse regarding current status.  RNCM checked back later in day and patient is now changed to inpatient status, attempted to reach daughter to discuss SNF options however she did not answer phone and no option to leave a VM.             Expected Discharge Plan: Skilled Nursing Facility Barriers to Discharge: Continued Medical Work up   Patient Goals and CMS Choice        Expected Discharge Plan and Services Expected Discharge Plan: [place]   Discharge Planning Services: CM Consult Post Acute Care Choice: [place] Living arrangements for the past 2 months: Single Family Home                                      Prior Living Arrangements/Services Living arrangements for the past 2 months: Single Family Home Lives with:: Adult Children Patient language and need for interpreter reviewed:: Yes        Need for Family Participation in Patient Care: Yes (Comment) Care giver support system in place?: Yes (comment)   Criminal Activity/Legal Involvement Pertinent to Current Situation/Hospitalization: No - Comment as needed  Activities of Daily Living Home Assistive Devices/Equipment: None ADL Screening (condition at time of admission) Patient's cognitive ability adequate to safely  complete daily activities?: Yes Is the patient deaf or have difficulty hearing?: No Does the patient have difficulty seeing, even when wearing glasses/contacts?: No Does the patient have difficulty concentrating, remembering, or making decisions?: Yes Patient able to express need for assistance with ADLs?: Yes Does the patient have difficulty dressing or bathing?: Yes Independently performs ADLs?: No Does the patient have difficulty walking or climbing stairs?: Yes Weakness of Legs: None Weakness of Arms/Hands: Both  Permission Sought/Granted                  Emotional Assessment Appearance:: Appears stated age Attitude/Demeanor/Rapport: Engaged Affect (typically observed): Appropriate Orientation: : Oriented to Self Alcohol / Substance Use: Not Applicable Psych Involvement: No (comment)  Admission diagnosis:  Fall [W19.XXXA] Encephalopathy [G93.40] Acute cystitis without hematuria [N30.00] Facial laceration, initial encounter [S01.81XA] Fall, initial encounter [W19.XXXA] Dehydration [E86.0] Patient Active Problem List   Diagnosis Date Noted  . Dehydration 08/25/2019  . UTI (urinary tract infection) 08/24/2019  . Fall at home, initial encounter 08/24/2019  . Hypokalemia 08/24/2019  . RA (rheumatoid arthritis) ([place]) 08/24/2019  . Fall 08/24/2019  . Facial laceration   . Atrial fibrillation, chronic ([place]) 02/19/2019  . Sepsis ([place]) 08/03/2018  . Atypical pneumonia 08/03/2018  . Arthritis of right hip 08/03/2018  . HTN (hypertension) 08/03/2018  . HLD (hyperlipidemia) 08/03/2018  . AKI (acute kidney injury) ([place]) 09/15/2017  .  Symptomatic cholelithiasis 10/13/2014   PCP:  Derinda Late, MD Pharmacy:   [facility] [place], [place] [address] [place] [address] Phone: [phone] Fax: [phone]     Social Determinants of Health (SDOH) Interventions     Readmission Risk Interventions No flowsheet data found.

## 2019-08-25 NOTE — Evaluation (Signed)
Occupational Therapy Evaluation Patient Details Name: Angela Leonard MRN: [id number] DOB: September 16, 1934 Today's Date: 08/25/2019    History of Present Illness 85 y.o. female with medical history significant of hypertension, hyperlipidemia, atrial fibrillation on Eliquis, mild dementia with sundowning phenomena, rheumatoid arthritis on methotrexate, who presents with fall.  Pt with significant L facial bruising with acute laceration on subacute bruising.   Clinical Impression   Patient supine in bed upon entry and agreeable to therapy.  Vitals as follows 130/71, 77-84 HR and 96% 02 on RA.  Patient is able to state she has pain in her R elbow at rest.  Patient is only oriented to self at this time and demonstrates a large amount of pain avoidant behaviors; patient has baseline dementia.  Frequently asking Pryor Curia "where are your babies".  Patient is a poor historian and unable to assess PLOF.  Patient reports she was able to walk around the house.  Assisted patient with simple grooming at bed level with MAX A.  Attempted hand over hand assist, but patient cries out with pain when attempting to move bilateral shoulders.  Demonstrates greater AROM in L UE elbow/wrist/hand.  Attempted log rolling to utilize bed pan at this time with TOTAL A.  Patient unable to reach for bed railing and cries out with pain with movement saying "stop".  Patient unable to tolerate rolling or attempt to sit at EOB.  Positioned patient in midline in bed with TOTAL A.  Patient also refusing to eat breakfast; notified nurse.  Patient would benefit from continued occupational therapy services to address a multitude of deficits in performance components outlined below.  Due to the extensive amount of assistance patient is currently requiring, recommending SNF and 24/7 SPV/assist at time of discharge.    Follow Up Recommendations  SNF    Equipment Recommendations  Other (comment)(defer to next level of care)    Recommendations for  Other Services       Precautions / Restrictions Precautions Precautions: Fall Restrictions Weight Bearing Restrictions: No      Mobility Bed Mobility Overal bed mobility: Needs Assistance Bed Mobility: Rolling Rolling: Total assist         General bed mobility comments: Attempted to use bed pan with total assist for log rolling.  Patient unable to assist or tolerate, crying with pain.  Transfers Overall transfer level: Needs assistance               General transfer comment: Unable to attempt.    Balance Overall balance assessment: History of Falls                                         ADL either performed or assessed with clinical judgement   ADL Overall ADL's : Needs assistance/impaired   Eating/Feeding Details (indicate cue type and reason): Patient refusing food at this time.  Nursing has patient listed as "feeder". Grooming: Dance movement psychotherapist;Wash/dry hands;Maximal assistance;Bed level Grooming Details (indicate cue type and reason): Patient unable to bring washcloth to face.  Attempted hand over hand assistance, but patient is screaming in pain when attempting to move shoulders.                   Toilet Transfer Details (indicate cue type and reason): Attempted to use bedpan with log rolling.  Patient screaming out in pain when rolling saying "stop".  General ADL Comments: Unable to assess functional transfers at this time secondary to pain.  Patient is extremely limited in ability to participate due to fear of pain and cognitive deficits.     Vision   Additional Comments: L eye is swollen shut     Perception     Praxis      Pertinent Vitals/Pain Pain Assessment: Faces(Patient reports pain in R elbow at rest.  However, patient cries out in pain with any movement.) Faces Pain Scale: Hurts whole lot Pain Location: Able to verbalize pain in R elbow at rest. However, cries out in pain with any attempted movement.  Even  with washing face. Pain Descriptors / Indicators: Crying;Grimacing;Moaning;Guarding Pain Intervention(s): Limited activity within patient's tolerance;Monitored during session;Repositioned     Hand Dominance Right   Extremity/Trunk Assessment Upper Extremity Assessment Upper Extremity Assessment: Difficult to assess due to impaired cognition;Generalized weakness;RUE deficits/detail;LUE deficits/detail(Patient not moving shoulders,  ABle to move L UE from elbow down freely.  R UE has great difficulty with any elbow/wrist/shoulder/grasp movement.) RUE Deficits / Details: Poor shoulder/elbow/wrist/hand AROM RUE: Unable to fully assess due to pain RUE Coordination: decreased fine motor;decreased gross motor LUE Deficits / Details: Poor shoulder AROM LUE: Unable to fully assess due to pain LUE Coordination: decreased fine motor;decreased gross motor   Lower Extremity Assessment Lower Extremity Assessment: Defer to PT evaluation       Communication Communication Communication: No difficulties   Cognition Arousal/Alertness: Awake/alert Behavior During Therapy: WFL for tasks assessed/performed Overall Cognitive Status: No family/caregiver present to determine baseline cognitive functioning                                 General Comments: Patient has baseline dementia.  Able to state name.  Unable to state situation or place.  Patient frequently asking "where are your babies".   General Comments  Patient unable to tolerate sitting at EOB and very limited bed mobility.  Patient has extensive bruising.    Exercises Other Exercises Other Exercises: Provided education on goals and role of OT in acute care. Other Exercises: Attempted to perform log rolling for use of bedpan.  Patient unable to tolerate.  Encouraged patient to eat breakfast.  patient refusing.  Attempted to perform simple grooming at bed level, requiring hand over hand assistance and MAX A secondary to pain.    Shoulder Instructions      Home Living Family/patient expects to be discharged to:: Private residence Living Arrangements: Children Available Help at Discharge: Family Type of Home: House Home Access: Level entry;Stairs to enter Entrance Stairs-Number of Steps: 1   Home Layout: One level               Home Equipment: Sarepta - 2 wheels;Toilet riser;Shower seat   Additional Comments: PLOF gathered from chart.  Patient unable to answer majority of home questions.      Prior Functioning/Environment          Comments: Patient unable to report how much assistance she needs.  States "my daughter uses a walker".        OT Problem List: Decreased strength;Decreased range of motion;Decreased activity tolerance;Impaired balance (sitting and/or standing);Decreased safety awareness;Pain;Decreased cognition;Impaired UE functional use;Decreased coordination;Decreased knowledge of use of DME or AE      OT Treatment/Interventions: Self-care/ADL training;Therapeutic exercise;Energy conservation;DME and/or AE instruction;Therapeutic activities;Patient/family education    OT Goals(Current goals can be found in the care plan section) Acute Rehab  OT Goals Patient Stated Goal: Unable to state goal.  OT Frequency: Min 1X/week   Barriers to D/C:    Patient requiring extensive assistance at this time.       Co-evaluation              AM-PAC OT "6 Clicks" Daily Activity     Outcome Measure Help from another person eating meals?: A Lot Help from another person taking care of personal grooming?: A Lot Help from another person toileting, which includes using toliet, bedpan, or urinal?: Total Help from another person bathing (including washing, rinsing, drying)?: Total Help from another person to put on and taking off regular upper body clothing?: Total Help from another person to put on and taking off regular lower body clothing?: Total 6 Click Score: 8   End of Session Nurse  Communication: Other (comment)(Spoke to nurse stating patient unable to sit up and refusing breakfast.)  Activity Tolerance: Patient limited by pain Patient left: in bed;with call Tutor/phone within reach;with bed alarm set  OT Visit Diagnosis: Muscle weakness (generalized) (M62.81);History of falling (Z91.81)                Time: 7282-0601 OT Time Calculation (min): 21 min Charges:  OT General Charges $OT Visit: 1 Visit OT Evaluation $OT Eval Moderate Complexity: 1 Mod OT Treatments $Self Care/Home Management : 8-22 mins  Baldomero Lamy, MS, OTR/L 08/25/19, 11:27 AM

## 2019-08-25 NOTE — Progress Notes (Signed)
PROGRESS NOTE  Angela Leonard  EXB:[id number] DOB: 1935/02/02 DOA: 08/24/2019 PCP: Derinda Late, MD   Brief Narrative: Angela Leonard is an 85 y.o. female with a history of HTN, HLD, AFib on eliquis, dementia, RA on MTX who presented from home after a fall 3/30. Pt was found to have WBC 8.8, urinalysis (hazy appearance, small amount of leukocyte, rare bacteria, WBC 11-20), pending COVID-19 PCR, CK 79, potassium 3.4, renal function normal.  Temperature normal, blood pressure 152/109, 119/103, heart rate 95, oxygen saturation 95% on room air.  Chest x-ray negative.  CT of her head is negative for acute intracranial abnormalities, but showed hematoma in the left frontal area.  CT of C-spine is negative for bony fracture, but showed degenerative disc disease.  CT of her maxillofacial images negative for bony fracture. No bony fracture by x-ray of left hip/pelvis, x-ray of left humerus and x-ray of right hip.  Assessment & Plan: Principal Problem:   Fall at home, initial encounter Active Problems:   HTN (hypertension)   HLD (hyperlipidemia)   Atrial fibrillation, chronic (HCC)   UTI (urinary tract infection)   Hypokalemia   RA (rheumatoid arthritis) ([place])   Fall   Facial laceration   Dehydration  UTI:  - Continue CTX pending Cx  Fall at home with facial laceration: s/p repair. Imaging negative for fracture/dislocation. No focal deficits, though generally weak and anticipate need for SNF at discharge.  - Continue IVF and monitor orthostatic vital signs. Taking very little/not enough to sustain life by mouth.  - Remove sutures likely in 5-7 days.   Dementia complicated by acute delirium:  - Delirium precautions - Melatonin qHS  Degenerative joint disease: Noted on R > L hips on XR. No fracture or dislocation.   Chronic AFib: CHA2DS2-VASc score is at least 4.  - Continue diltiazem - No ongoing bleeding, blood counts stable, can restart DOAC.  Hypokalemia: Mild. Supplemented.  -  Recheck in AM  Hypomagnesemia:  - Supplement and monitor Mg  RA:  - Continue MTX/folate. This is cause of macrocytosis.   HLD:  - Continue statin  DVT prophylaxis: SCDs Code Status: Full Family Communication: Daughter by phone Disposition Plan: Patient from home where she lives with daughter who requires a walker to get around and can offer little assistance. The patient continues to have inadequate oral intake for safe discharge. Will need continued IV fluids over this next midnight, so it is reasonable and necessary to change to inpatient status. When she is improving, will require rehabilitation at SNF prior to return home.  Consultants:   None  Procedures:   None  Antimicrobials:  Ceftriaxone   Subjective: Care provider for shopping, cleaning Mon-Wed. Not eating regularly at home, on and off. Didn't recognize her own daughter at all yesterday. She's been confused and agitated overnight.   Objective: Vitals:   08/24/19 2100 08/25/19 0200 08/25/19 0758 08/25/19 1118  BP: (!) 142/87  130/71 119/65  Pulse:  64 80   Resp: (!) 23 16 19    Temp: 97.6 F (36.4 C)     TempSrc: Axillary     SpO2: 97% 97% 96%   Weight:      Height:        Intake/Output Summary (Last 24 hours) at 08/25/2019 1300 Last data filed at 08/24/2019 1500 Gross per 24 hour  Intake 0 ml  Output --  Net 0 ml   Filed Weights   08/24/19 0314  Weight: 65 kg    Gen: Elderly female in  no distress Pulm: Non-labored breathing. Clear to auscultation bilaterally.  CV: Regular rate and rhythm. No murmur, rub, or gallop. No JVD, no pedal edema. GI: Abdomen soft, non-tender, non-distended, with normoactive bowel sounds. No organomegaly or masses felt. Ext: Warm, no deformities Skin: No rashes, lesions or ulcers Neuro: Alert and disoriented. No focal neurological deficits. Psych: Judgement and insight appear impaired. Mood & affect appropriate.   Data Reviewed: I have personally reviewed following labs  and imaging studies  CBC: Recent Labs  Lab 08/24/19 0330 08/25/19 0432  WBC 8.8 7.8  NEUTROABS 6.7  --   HGB 15.4* 14.8  HCT 46.3* 43.2  MCV 102.4* 100.9*  PLT 246 633   Basic Metabolic Panel: Recent Labs  Lab 08/24/19 0330 08/25/19 0432  NA 140 142  K 3.4* 3.9  CL 103 112*  CO2 24 24  GLUCOSE 106* 96  BUN 17 15  CREATININE 0.94 0.71  CALCIUM 9.5 8.7*  MG  --  1.5*   GFR: Estimated Creatinine Clearance: 48.8 mL/min (by C-G formula based on SCr of 0.71 mg/dL). Liver Function Tests: No results for input(s): AST, ALT, ALKPHOS, BILITOT, PROT, ALBUMIN in the last 168 hours. No results for input(s): LIPASE, AMYLASE in the last 168 hours. No results for input(s): AMMONIA in the last 168 hours. Coagulation Profile: No results for input(s): INR, PROTIME in the last 168 hours. Cardiac Enzymes: Recent Labs  Lab 08/24/19 0330  CKTOTAL 79   BNP (last 3 results) No results for input(s): PROBNP in the last 8760 hours. HbA1C: No results for input(s): HGBA1C in the last 72 hours. CBG: Recent Labs  Lab 08/25/19 0736  GLUCAP 94   Lipid Profile: No results for input(s): CHOL, HDL, LDLCALC, TRIG, CHOLHDL, LDLDIRECT in the last 72 hours. Thyroid Function Tests: No results for input(s): TSH, T4TOTAL, FREET4, T3FREE, THYROIDAB in the last 72 hours. Anemia Panel: No results for input(s): VITAMINB12, FOLATE, FERRITIN, TIBC, IRON, RETICCTPCT in the last 72 hours. Urine analysis:    Component Value Date/Time   COLORURINE YELLOW (A) 08/24/2019 0330   APPEARANCEUR HAZY (A) 08/24/2019 0330   LABSPEC 1.021 08/24/2019 0330   PHURINE 5.0 08/24/2019 0330   GLUCOSEU NEGATIVE 08/24/2019 0330   HGBUR SMALL (A) 08/24/2019 0330   BILIRUBINUR NEGATIVE 08/24/2019 0330   KETONESUR 20 (A) 08/24/2019 0330   PROTEINUR 30 (A) 08/24/2019 0330   NITRITE NEGATIVE 08/24/2019 0330   LEUKOCYTESUR SMALL (A) 08/24/2019 0330   Recent Results (from the past 240 hour(s))  Urine culture     Status:  Abnormal   Collection Time: 08/24/19  3:30 AM   Specimen: Urine, Clean Catch  Result Value Ref Range Status   Specimen Description   Final    URINE, CLEAN CATCH Performed at [facility], [address] [place], [place] [address]    Special Requests   Final    Normal Performed at [facility], [address] [place], [place] [address]    Culture (A)  Final    <10,000 COLONIES/mL INSIGNIFICANT GROWTH Performed at [facility], [place] [address] [place], [place] [address]    Report Status 08/25/2019 FINAL  Final  SARS CORONAVIRUS 2 (TAT 6-24 HRS) Nasopharyngeal Nasopharyngeal Swab     Status: None   Collection Time: 08/24/19  7:08 AM   Specimen: Nasopharyngeal Swab  Result Value Ref Range Status   SARS Coronavirus 2 NEGATIVE NEGATIVE Final    Comment: (NOTE) SARS-CoV-2 target nucleic acids are NOT DETECTED. The SARS-CoV-2 RNA  is generally detectable in upper and lower respiratory specimens during the acute phase of infection. Negative results do not preclude SARS-CoV-2 infection, do not rule out co-infections with other pathogens, and should not be used as the sole basis for treatment or other patient management decisions. Negative results must be combined with clinical observations, patient history, and epidemiological information. The expected result is Negative. Fact Sheet for Patients: SugarRoll.be Fact Sheet for Healthcare Providers: https://www.woods-mathews.com/ This test is not yet approved or cleared by the [place] FDA and  has been authorized for detection and/or diagnosis of SARS-CoV-2 by FDA under an Emergency Use Authorization (EUA). This EUA will remain  in effect (meaning this test can be used) for the duration of the COVID-19 declaration under Section 56 4(b)(1) of the Act, 21 U.S.C. section 360bbb-3(b)(1), unless the authorization is terminated or revoked sooner. Performed at [facility], [place] [address] [place], [place] [address]       Radiology Studies: CT Head Wo Contrast  Result Date: 08/24/2019 CLINICAL DATA:  Fall, found down, left frontal swelling/hematoma, left eye bruising/laceration EXAM: CT HEAD WITHOUT CONTRAST CT MAXILLOFACIAL WITHOUT CONTRAST CT CERVICAL SPINE WITHOUT CONTRAST TECHNIQUE: Multidetector CT imaging of the head, cervical spine, and maxillofacial structures were performed using the standard protocol without intravenous contrast. Multiplanar CT image reconstructions of the cervical spine and maxillofacial structures were also generated. COMPARISON:  CT head/cervical spine dated 02/19/2019 FINDINGS: CT HEAD FINDINGS Brain: No evidence of acute infarction, hemorrhage, hydrocephalus, extra-axial collection or mass lesion/mass effect. Mild cortical atrophy. Extensive subcortical white matter and periventricular small vessel ischemic changes. Vascular: Intracranial atherosclerosis. Skull: Normal. Negative for fracture or focal lesion. Other: Mild soft tissue swelling/hematoma/laceration overlying the left frontal bone (series 2/image 9). CT MAXILLOFACIAL FINDINGS Osseous: No evidence of maxillofacial fracture. Mandible is intact. Bilateral mandibular condyles are well-seated in the TMJs. Orbits: Bilateral orbits, including the globes and retroconal soft tissues, are within normal limits. Sinuses: Partial opacification with layering high density in the left maxillary sinus, likely reflecting inspissated secretions. Visualized paranasal sinuses and mastoid air cells are otherwise clear. Soft tissues: Mild soft tissue swelling/hematoma with laceration overlying the left frontal bone (series 2/image 1). Mild soft tissue swelling/bruising overlying the left maxilla (series 2/image 20). CT CERVICAL SPINE FINDINGS Alignment: Reversal of the normal cervical lordosis. Skull base and vertebrae: No acute fracture. No primary bone lesion or focal pathologic process. Soft  tissues and spinal canal: No prevertebral fluid or swelling. No visible canal hematoma. Disc levels: Moderate degenerative changes of the mid/lower cervical spine. Mild narrowing of the spinal canal at C4-5, unchanged. Upper chest: Visualized lung apices are notable for emphysematous changes. Other: Visualized thyroid is unremarkable. IMPRESSION: Mild soft tissue swelling/hematoma/laceration overlying the left frontal bone. No evidence of calvarial fracture. No evidence of acute intracranial abnormality. Atrophy with small vessel ischemic changes. Mild soft tissue swelling/bruising overlying the left maxilla. No evidence of maxillofacial fracture. Chronic opacity in the left maxillary sinus. No evidence of acute traumatic injury to the cervical spine. Moderate degenerative changes. Mild narrowing of the spinal canal at C4-5, unchanged. Electronically Signed   By: Julian Hy M.D.   On: 08/24/2019 04:03   CT Cervical Spine Wo Contrast  Result Date: 08/24/2019 CLINICAL DATA:  Fall, found down, left frontal swelling/hematoma, left eye bruising/laceration EXAM: CT HEAD WITHOUT CONTRAST CT MAXILLOFACIAL WITHOUT CONTRAST CT CERVICAL SPINE WITHOUT CONTRAST TECHNIQUE: Multidetector CT imaging of the head, cervical spine, and maxillofacial structures were performed using the standard protocol without  intravenous contrast. Multiplanar CT image reconstructions of the cervical spine and maxillofacial structures were also generated. COMPARISON:  CT head/cervical spine dated 02/19/2019 FINDINGS: CT HEAD FINDINGS Brain: No evidence of acute infarction, hemorrhage, hydrocephalus, extra-axial collection or mass lesion/mass effect. Mild cortical atrophy. Extensive subcortical white matter and periventricular small vessel ischemic changes. Vascular: Intracranial atherosclerosis. Skull: Normal. Negative for fracture or focal lesion. Other: Mild soft tissue swelling/hematoma/laceration overlying the left frontal bone (series  2/image 9). CT MAXILLOFACIAL FINDINGS Osseous: No evidence of maxillofacial fracture. Mandible is intact. Bilateral mandibular condyles are well-seated in the TMJs. Orbits: Bilateral orbits, including the globes and retroconal soft tissues, are within normal limits. Sinuses: Partial opacification with layering high density in the left maxillary sinus, likely reflecting inspissated secretions. Visualized paranasal sinuses and mastoid air cells are otherwise clear. Soft tissues: Mild soft tissue swelling/hematoma with laceration overlying the left frontal bone (series 2/image 1). Mild soft tissue swelling/bruising overlying the left maxilla (series 2/image 20). CT CERVICAL SPINE FINDINGS Alignment: Reversal of the normal cervical lordosis. Skull base and vertebrae: No acute fracture. No primary bone lesion or focal pathologic process. Soft tissues and spinal canal: No prevertebral fluid or swelling. No visible canal hematoma. Disc levels: Moderate degenerative changes of the mid/lower cervical spine. Mild narrowing of the spinal canal at C4-5, unchanged. Upper chest: Visualized lung apices are notable for emphysematous changes. Other: Visualized thyroid is unremarkable. IMPRESSION: Mild soft tissue swelling/hematoma/laceration overlying the left frontal bone. No evidence of calvarial fracture. No evidence of acute intracranial abnormality. Atrophy with small vessel ischemic changes. Mild soft tissue swelling/bruising overlying the left maxilla. No evidence of maxillofacial fracture. Chronic opacity in the left maxillary sinus. No evidence of acute traumatic injury to the cervical spine. Moderate degenerative changes. Mild narrowing of the spinal canal at C4-5, unchanged. Electronically Signed   By: Julian Hy M.D.   On: 08/24/2019 04:03   DG Chest Portable 1 View  Result Date: 08/24/2019 CLINICAL DATA:  Fall with weakness EXAM: PORTABLE CHEST 1 VIEW COMPARISON:  Nine [place]-seven FINDINGS: Chronic  interstitial opacity. Normal heart size and mediastinal contours. No visible effusion or pneumothorax. IMPRESSION: 1. No acute finding when compared to prior. 2. Chronic interstitial opacity. Electronically Signed   By: Monte Fantasia M.D.   On: 08/24/2019 04:47   DG Humerus Left  Result Date: 08/24/2019 CLINICAL DATA:  [place]. Left arm pain. EXAM: LEFT HUMERUS - 2+ VIEW COMPARISON:  None. FINDINGS: The shoulder and elbow joints are grossly maintained. No acute fracture of the humerus is identified. IMPRESSION: No acute bony findings. Electronically Signed   By: Marijo Sanes M.D.   On: 08/24/2019 07:14   DG HIP UNILAT WITH PELVIS 2-3 VIEWS LEFT  Result Date: 08/24/2019 CLINICAL DATA:  [place]. Bilateral hip pain. EXAM: DG HIP (WITH OR WITHOUT PELVIS) 2-3V RIGHT; DG HIP (WITH OR WITHOUT PELVIS) 2-3V LEFT COMPARISON:  09/15/2017 FINDINGS: Both hips are normally located. Stable degenerative changes, right greater than left. No acute hip fracture is identified. The pubic symphysis and SI joints are intact. No definite pelvic fractures. Stable vascular calcifications. IMPRESSION: Stable degenerative changes but no acute hip or pelvic fracture. Electronically Signed   By: Marijo Sanes M.D.   On: 08/24/2019 07:13   DG HIP UNILAT WITH PELVIS 2-3 VIEWS RIGHT  Result Date: 08/24/2019 CLINICAL DATA:  [place]. Bilateral hip pain. EXAM: DG HIP (WITH OR WITHOUT PELVIS) 2-3V RIGHT; DG HIP (WITH OR WITHOUT PELVIS) 2-3V LEFT COMPARISON:  09/15/2017 FINDINGS: Both hips are normally  located. Stable degenerative changes, right greater than left. No acute hip fracture is identified. The pubic symphysis and SI joints are intact. No definite pelvic fractures. Stable vascular calcifications. IMPRESSION: Stable degenerative changes but no acute hip or pelvic fracture. Electronically Signed   By: Marijo Sanes M.D.   On: 08/24/2019 07:13   CT Maxillofacial Wo Contrast  Result Date: 08/24/2019 CLINICAL DATA:  Fall, found down, left  frontal swelling/hematoma, left eye bruising/laceration EXAM: CT HEAD WITHOUT CONTRAST CT MAXILLOFACIAL WITHOUT CONTRAST CT CERVICAL SPINE WITHOUT CONTRAST TECHNIQUE: Multidetector CT imaging of the head, cervical spine, and maxillofacial structures were performed using the standard protocol without intravenous contrast. Multiplanar CT image reconstructions of the cervical spine and maxillofacial structures were also generated. COMPARISON:  CT head/cervical spine dated 02/19/2019 FINDINGS: CT HEAD FINDINGS Brain: No evidence of acute infarction, hemorrhage, hydrocephalus, extra-axial collection or mass lesion/mass effect. Mild cortical atrophy. Extensive subcortical white matter and periventricular small vessel ischemic changes. Vascular: Intracranial atherosclerosis. Skull: Normal. Negative for fracture or focal lesion. Other: Mild soft tissue swelling/hematoma/laceration overlying the left frontal bone (series 2/image 9). CT MAXILLOFACIAL FINDINGS Osseous: No evidence of maxillofacial fracture. Mandible is intact. Bilateral mandibular condyles are well-seated in the TMJs. Orbits: Bilateral orbits, including the globes and retroconal soft tissues, are within normal limits. Sinuses: Partial opacification with layering high density in the left maxillary sinus, likely reflecting inspissated secretions. Visualized paranasal sinuses and mastoid air cells are otherwise clear. Soft tissues: Mild soft tissue swelling/hematoma with laceration overlying the left frontal bone (series 2/image 1). Mild soft tissue swelling/bruising overlying the left maxilla (series 2/image 20). CT CERVICAL SPINE FINDINGS Alignment: Reversal of the normal cervical lordosis. Skull base and vertebrae: No acute fracture. No primary bone lesion or focal pathologic process. Soft tissues and spinal canal: No prevertebral fluid or swelling. No visible canal hematoma. Disc levels: Moderate degenerative changes of the mid/lower cervical spine. Mild  narrowing of the spinal canal at C4-5, unchanged. Upper chest: Visualized lung apices are notable for emphysematous changes. Other: Visualized thyroid is unremarkable. IMPRESSION: Mild soft tissue swelling/hematoma/laceration overlying the left frontal bone. No evidence of calvarial fracture. No evidence of acute intracranial abnormality. Atrophy with small vessel ischemic changes. Mild soft tissue swelling/bruising overlying the left maxilla. No evidence of maxillofacial fracture. Chronic opacity in the left maxillary sinus. No evidence of acute traumatic injury to the cervical spine. Moderate degenerative changes. Mild narrowing of the spinal canal at C4-5, unchanged. Electronically Signed   By: Julian Hy M.D.   On: 08/24/2019 04:03    Scheduled Meds:  aspirin EC  81 mg Oral QHS   cholecalciferol  1,000 Units Oral Daily   diltiazem  120 mg Oral Daily   folic acid  1 mg Oral Daily   methotrexate  17.5 mg Oral Q Tue   multivitamin-lutein  1 capsule Oral Daily   polymixin-bacitracin   Topical BID   ramipril  10 mg Oral Daily   simvastatin  20 mg Oral Daily   vitamin B-12  1,000 mcg Oral Daily   Continuous Infusions:  sodium chloride 75 mL/hr at 08/25/19 1113     LOS: 0 days   Time spent: 25 minutes.  Patrecia Pour, MD [facility] [email or web address] 08/25/2019, 1:00 PM

## 2019-08-25 NOTE — Progress Notes (Signed)
PT Cancellation Note  Patient Details Name: Micaylah Bertucci MRN: [id number] DOB: [birth year]   Cancelled Treatment:    Reason Eval/Treat Not Completed: Other (comment).  Upon PT arrival to pt's room, pt sleeping in bed.  Pt woken with gentle vc's and tactile cues (pt then talking with therapist but never opened her eyes).  Pt reporting being too tired to participate in therapy (declined OOB mobility and ex's in bed); also concerns noted regarding pain with movement.  Discussed with pt's nurse.  Will re-attempt PT treatment session at a later date/time.  Leitha Bleak, PT 08/25/19, 3:48 PM

## 2019-08-26 LAB — BASIC METABOLIC PANEL
Anion gap: 9 (ref 5–15)
BUN: 16 mg/dL (ref 8–23)
CO2: 21 mmol/L — ABNORMAL LOW (ref 22–32)
Calcium: 8 mg/dL — ABNORMAL LOW (ref 8.9–10.3)
Chloride: 110 mmol/L (ref 98–111)
Creatinine, Ser: 0.66 mg/dL (ref 0.44–1.00)
GFR calc Af Amer: >60 mL/min (ref >60)
GFR calc non Af Amer: >60 mL/min (ref >60)
Glucose, Bld: 83 mg/dL (ref 70–99)
Potassium: 3.8 mmol/L (ref 3.5–5.1)
Sodium: 140 mmol/L (ref 135–145)

## 2019-08-26 LAB — GLUCOSE, CAPILLARY: Glucose-Capillary: 70 mg/dL (ref 70–99)

## 2019-08-26 LAB — MAGNESIUM: Magnesium: 2 mg/dL (ref 1.7–2.4)

## 2019-08-26 MED ORDER — DILTIAZEM HCL 30 MG PO TABS
30.0000 mg | ORAL_TABLET | Freq: Once | ORAL | Status: AC
  Administered 2019-08-26: 30 mg via ORAL
  Filled 2019-08-26: qty 1

## 2019-08-26 MED ORDER — DILTIAZEM HCL ER COATED BEADS 180 MG PO CP24
180.0000 mg | ORAL_CAPSULE | Freq: Every day | ORAL | Status: DC
  Administered 2019-08-27: 180 mg via ORAL
  Filled 2019-08-26: qty 1

## 2019-08-26 NOTE — Progress Notes (Signed)
PROGRESS NOTE  Angela Leonard  RKY:[id number] DOB: 1934-12-30 DOA: 08/24/2019 PCP: Derinda Late, MD   Brief Narrative: Angela Leonard is an 85 y.o. female with a history of HTN, HLD, AFib on eliquis, dementia, RA on MTX who presented from home after a fall 3/30. Pt was found to have WBC 8.8, urinalysis (hazy appearance, small amount of leukocyte, rare bacteria, WBC 11-20), pending COVID-19 PCR, CK 79, potassium 3.4, renal function normal.  Temperature normal, blood pressure 152/109, 119/103, heart rate 95, oxygen saturation 95% on room air.  Chest x-ray negative.  CT of her head is negative for acute intracranial abnormalities, but showed hematoma in the left frontal area.  CT of C-spine is negative for bony fracture, but showed degenerative disc disease.  CT of her maxillofacial images negative for bony fracture. No bony fracture by x-ray of left hip/pelvis, x-ray of left humerus and x-ray of right hip.  Assessment & Plan: Principal Problem:   Fall at home, initial encounter Active Problems:   HTN (hypertension)   HLD (hyperlipidemia)   Atrial fibrillation, chronic (HCC)   UTI (urinary tract infection)   Hypokalemia   RA (rheumatoid arthritis) ([place])   Fall   Facial laceration   Dehydration  UTI:  - Complete 3 days ceftriaxone (culture not with significant growth)   Fall at home with facial laceration: s/p repair. Imaging negative for fracture/dislocation. No focal deficits, though generally weak and anticipate need for SNF at discharge.  - Starting to eat more today, stop IVF.   - Remove sutures likely in 5-7 days.   Dementia complicated by acute delirium:  - Delirium precautions - Melatonin qHS  Degenerative joint disease: Noted on R > L hips on XR. No fracture or dislocation.   Chronic AFib with exertional RVR: CHA2DS2-VASc score is at least 4.  - Continue diltiazem, give additional dose now and change to 180mg  ER tomorrow AM. BP is not low. - No ongoing bleeding, blood counts  stable, can restart DOAC.  Hypokalemia: Mild. Supplemented.  - Recheck in AM  Hypomagnesemia:  - Supplement and monitor Mg  RA:  - Continue MTX/folate. This is cause of macrocytosis.   HLD:  - Continue statin  DVT prophylaxis: SCDs Code Status: Full Family Communication: Daughter by phone Disposition Plan: Patient from home where she lives with daughter who requires a walker to get around and can offer little assistance. The patient is having rapid ventricular response to atrial fibrillation so will need close monitoring and change of diltiazem dosing. Hopeful that if this stabilizes and her po intake continues improving, she could be eligible for DC 4/2 to SNF.   Consultants:   None  Procedures:   None  Antimicrobials:  Ceftriaxone   Subjective: Continues to be confused but denies pain. Very high HR while working with PT, limited to 15 feet ambulation. No fevers, denies urinary symptoms currently.  Objective: Vitals:   08/26/19 0000 08/26/19 0400 08/26/19 0800 08/26/19 1616  BP: 136/79 (!) 144/70 (!) 144/98 (!) 152/85  Pulse: 84 74  96  Resp: 18 17 (!) 22 (!) 21  Temp: (!) 97.4 F (36.3 C)  97.7 F (36.5 C) 97.7 F (36.5 C)  TempSrc: Oral  Axillary Oral  SpO2: 96% 97%  97%  Weight:      Height:        Intake/Output Summary (Last 24 hours) at 08/26/2019 1755 Last data filed at 08/26/2019 1434 Gross per 24 hour  Intake 2464.5 ml  Output 200 ml  Net 2264.5  ml   Filed Weights   08/24/19 0314  Weight: 65 kg   Gen: Frail elderly female in no distress Pulm: Nonlabored breathing room air. Clear. CV: Irreg irreg. No murmur, rub, or gallop. No JVD, no dependent edema. GI: Abdomen soft, non-tender, non-distended, with normoactive bowel sounds.  Ext: Warm, no deformities Skin: No new rashes, lesions or ulcers on visualized skin. Ecchymoses on left frontal face/scalp stable. Laceration is hemostatic w/steristrips. Neuro: Alert and disoriented, without focal  neurological deficits. Psych: Judgement and insight appear impaired. Mood euthymic & affect congruent.    Data Reviewed: I have personally reviewed following labs and imaging studies  CBC: Recent Labs  Lab 08/24/19 0330 08/25/19 0432  WBC 8.8 7.8  NEUTROABS 6.7  --   HGB 15.4* 14.8  HCT 46.3* 43.2  MCV 102.4* 100.9*  PLT 246 371   Basic Metabolic Panel: Recent Labs  Lab 08/24/19 0330 08/25/19 0432 08/26/19 0348  NA 140 142 140  K 3.4* 3.9 3.8  CL 103 112* 110  CO2 24 24 21*  GLUCOSE 106* 96 83  BUN 17 15 16   CREATININE 0.94 0.71 0.66  CALCIUM 9.5 8.7* 8.0*  MG  --  1.5* 2.0   GFR: Estimated Creatinine Clearance: 48.8 mL/min (by C-G formula based on SCr of 0.66 mg/dL). Liver Function Tests: No results for input(s): AST, ALT, ALKPHOS, BILITOT, PROT, ALBUMIN in the last 168 hours. No results for input(s): LIPASE, AMYLASE in the last 168 hours. No results for input(s): AMMONIA in the last 168 hours. Coagulation Profile: No results for input(s): INR, PROTIME in the last 168 hours. Cardiac Enzymes: Recent Labs  Lab 08/24/19 0330  CKTOTAL 79   BNP (last 3 results) No results for input(s): PROBNP in the last 8760 hours. HbA1C: No results for input(s): HGBA1C in the last 72 hours. CBG: Recent Labs  Lab 08/25/19 0736 08/26/19 0736  GLUCAP 94 70   Lipid Profile: No results for input(s): CHOL, HDL, LDLCALC, TRIG, CHOLHDL, LDLDIRECT in the last 72 hours. Thyroid Function Tests: No results for input(s): TSH, T4TOTAL, FREET4, T3FREE, THYROIDAB in the last 72 hours. Anemia Panel: No results for input(s): VITAMINB12, FOLATE, FERRITIN, TIBC, IRON, RETICCTPCT in the last 72 hours. Urine analysis:    Component Value Date/Time   COLORURINE YELLOW (A) 08/24/2019 0330   APPEARANCEUR HAZY (A) 08/24/2019 0330   LABSPEC 1.021 08/24/2019 0330   PHURINE 5.0 08/24/2019 0330   GLUCOSEU NEGATIVE 08/24/2019 0330   HGBUR SMALL (A) 08/24/2019 0330   BILIRUBINUR NEGATIVE  08/24/2019 0330   KETONESUR 20 (A) 08/24/2019 0330   PROTEINUR 30 (A) 08/24/2019 0330   NITRITE NEGATIVE 08/24/2019 0330   LEUKOCYTESUR SMALL (A) 08/24/2019 0330   Recent Results (from the past 240 hour(s))  Urine culture     Status: Abnormal   Collection Time: 08/24/19  3:30 AM   Specimen: Urine, Clean Catch  Result Value Ref Range Status   Specimen Description   Final    URINE, CLEAN CATCH Performed at [facility] [address], [place], [place] [address]    Special Requests   Final    Normal Performed at [facility], [address], [place], [place] [address]    Culture (A)  Final    <10,000 COLONIES/mL INSIGNIFICANT GROWTH Performed at [facility], [place] [address] [place], [place] [address]    Report Status 08/25/2019 FINAL  Final  SARS CORONAVIRUS 2 (TAT 6-24 HRS) Nasopharyngeal Nasopharyngeal Swab     Status: None  Collection Time: 08/24/19  7:08 AM   Specimen: Nasopharyngeal Swab  Result Value Ref Range Status   SARS Coronavirus 2 NEGATIVE NEGATIVE Final    Comment: (NOTE) SARS-CoV-2 target nucleic acids are NOT DETECTED. The SARS-CoV-2 RNA is generally detectable in upper and lower respiratory specimens during the acute phase of infection. Negative results do not preclude SARS-CoV-2 infection, do not rule out co-infections with other pathogens, and should not be used as the sole basis for treatment or other patient management decisions. Negative results must be combined with clinical observations, patient history, and epidemiological information. The expected result is Negative. Fact Sheet for Patients: SugarRoll.be Fact Sheet for Healthcare Providers: https://www.woods-mathews.com/ This test is not yet approved or cleared by the [place] FDA and  has been authorized for detection and/or diagnosis of SARS-CoV-2 by FDA under an Emergency Use Authorization (EUA). This EUA will remain  in  effect (meaning this test can be used) for the duration of the COVID-19 declaration under Section 56 4(b)(1) of the Act, 21 U.S.C. section 360bbb-3(b)(1), unless the authorization is terminated or revoked sooner. Performed at [facility], [place] [address] [place], [place] [address]       Radiology Studies: No results found.  Scheduled Meds: . apixaban  5 mg Oral Q12H  . aspirin EC  81 mg Oral QHS  . cholecalciferol  1,000 Units Oral Daily  . diltiazem  120 mg Oral Daily  . folic acid  1 mg Oral Daily  . melatonin  2.5 mg Oral QHS  . methotrexate  17.5 mg Oral Q Tue  . multivitamin-lutein  1 capsule Oral Daily  . polymixin-bacitracin   Topical BID  . ramipril  10 mg Oral Daily  . simvastatin  20 mg Oral Daily  . vitamin B-12  1,000 mcg Oral Daily   Continuous Infusions: . sodium chloride 75 mL/hr at 08/26/19 1002     LOS: 1 day   Time spent: 25 minutes.  Patrecia Pour, MD [facility] [email or web address] 08/26/2019, 5:55 PM

## 2019-08-26 NOTE — Progress Notes (Signed)
Physical Therapy Treatment Patient Details Name: Angela Leonard MRN: [id number] DOB: 01/10/1935 Today's Date: 08/26/2019    History of Present Illness 85 y.o. female with medical history significant of hypertension, hyperlipidemia, atrial fibrillation on Eliquis, mild dementia with sundowning phenomena, rheumatoid arthritis on methotrexate, who presents with fall.  Pt with significant L facial bruising with acute laceration on subacute bruising.    PT Comments    Pt resting in bed upon PT arrival.  Min assist semi-supine to sitting edge of bed. Min assist standing from bed and taking steps bed to recliner (pt unsteady taking steps requiring assist for balance).  CGA to stand from recliner and to walk 15 feet with RW.  Limited distance ambulating d/t HR increasing from 98 bpm at rest to 144 bpm with activity (HR decreased to 100 bpm with sitting rest break)--nurse notified regarding pt's HR during session.  Pt reporting L UE pain with L UE movement/use during session (pt using R UE to assist L UE with movement at times and to place L hand on walker).  Will continue to focus on strengthening and progressive functional mobility during hospital stay.  If pt chooses to discharge home, pt would require 24/7 assist with functional mobility for safety and for balance.   Follow Up Recommendations  SNF     Equipment Recommendations  Rolling walker with 5" wheels;3in1 (PT)    Recommendations for Other Services       Precautions / Restrictions Precautions Precautions: Fall Restrictions Weight Bearing Restrictions: No    Mobility  Bed Mobility Overal bed mobility: Needs Assistance Bed Mobility: Supine to Sit     Supine to sit: Min assist     General bed mobility comments: minimal assist for trunk; increased effort for pt to perform  Transfers Overall transfer level: Needs assistance Equipment used: Rolling walker (2 wheeled) Transfers: Sit to/from [facility] Sit to  Stand: Min assist;Min guard Stand pivot transfers: Min assist       General transfer comment: min assist to stand from bed; min assist stand step turn bed to recliner; CGA to stand from recliner; vc's for UE placement; increased effort to stand noted  Ambulation/Gait Ambulation/Gait assistance: Min guard Gait Distance (Feet): 15 Feet Assistive device: Rolling walker (2 wheeled)   Gait velocity: decreased   General Gait Details: decreased B LE step length/foot clearance   Stairs             Wheelchair Mobility    Modified Rankin (Stroke Patients Only)       Balance Overall balance assessment: History of Falls;Needs assistance Sitting-balance support: No upper extremity supported;Feet supported Sitting balance-[name] Scale: Good Sitting balance - Comments: steady sitting reaching within BOS   Standing balance support: No upper extremity supported;During functional activity Standing balance-[name] Scale: Poor Standing balance comment: requiring assist for balance to transfer bed to recliner                            Cognition Arousal/Alertness: Awake/alert Behavior During Therapy: WFL for tasks assessed/performed                                   General Comments: Oriented to person only      Exercises      General Comments General comments (skin integrity, edema, etc.): Bruising/abrasions noted L side of face.  Nursing cleared pt for participation in physical  therapy.  Pt agreeable to PT session.      Pertinent Vitals/Pain Pain Assessment: Faces Faces Pain Scale: (0/10 at rest; 4/10 with L UE movement) Pain Location: L UE Pain Descriptors / Indicators: Grimacing;Guarding Pain Intervention(s): Limited activity within patient's tolerance;Monitored during session;Repositioned  O2 sats WFL on room air during sessions activities.    Home Living                      Prior Function            PT Goals (current goals can  now be found in the care plan section) Acute Rehab PT Goals Patient Stated Goal: to improve mobility PT Goal Formulation: With patient Time For Goal Achievement: 09/07/19 Potential to Achieve Goals: Fair Progress towards PT goals: Progressing toward goals    Frequency    Min 2X/week      PT Plan Current plan remains appropriate    Co-evaluation              AM-PAC PT "6 Clicks" Mobility   Outcome Measure  Help needed turning from your back to your side while in a flat bed without using bedrails?: A Little Help needed moving from lying on your back to sitting on the side of a flat bed without using bedrails?: A Little Help needed moving to and from a bed to a chair (including a wheelchair)?: A Little Help needed standing up from a chair using your arms (e.g., wheelchair or bedside chair)?: A Little Help needed to walk in hospital room?: A Little Help needed climbing 3-5 steps with a railing? : A Lot 6 Click Score: 17    End of Session Equipment Utilized During Treatment: Gait belt Activity Tolerance: Treatment limited secondary to medical complications (Comment);Other (comment)(Increased HR with activity) Patient left: in chair;with call [place]/phone within reach;with chair alarm set Nurse Communication: Mobility status;Precautions;Other (comment)(Pt's HR status with activity) PT Visit Diagnosis: Muscle weakness (generalized) (M62.81);Difficulty in walking, not elsewhere classified (R26.2);Pain Pain - Right/Left: Left Pain - part of body: Shoulder     Time: 5784-6962 PT Time Calculation (min) (ACUTE ONLY): 43 min  Charges:  $Gait Training: 8-22 mins $Therapeutic Activity: 23-37 mins                     Madeleyn Schwimmer, PT 08/26/19, 11:13 AM

## 2019-08-26 NOTE — NC FL2 (Signed)
[place] LEVEL OF CARE SCREENING TOOL     IDENTIFICATION  Patient Name: Angela Leonard Birthdate: [birth year] Sex: female Admission Date (Current Location): 08/24/2019  [place] and [place] Number:  Engineering geologist and Address:  [facility], [address], [place], [place] [address]      Provider Number: [id number]  Attending Physician Name and Address:  Patrecia Pour, MD  Relative Name and Phone Number:       Current Level of Care: Hospital Recommended Level of Care: [place] Prior Approval Number:    Date Approved/Denied:   PASRR Number: [id number]  Discharge Plan: SNF    Current Diagnoses: Patient Active Problem List   Diagnosis Date Noted  . Dehydration 08/25/2019  . UTI (urinary tract infection) 08/24/2019  . Fall at home, initial encounter 08/24/2019  . Hypokalemia 08/24/2019  . RA (rheumatoid arthritis) ([place]) 08/24/2019  . Fall 08/24/2019  . Facial laceration   . Atrial fibrillation, chronic ([place]) 02/19/2019  . Sepsis ([place]) 08/03/2018  . Atypical pneumonia 08/03/2018  . Arthritis of right hip 08/03/2018  . HTN (hypertension) 08/03/2018  . HLD (hyperlipidemia) 08/03/2018  . AKI (acute kidney injury) ([place]) 09/15/2017  . Symptomatic cholelithiasis 10/13/2014    Orientation RESPIRATION BLADDER Height & Weight     Self, Place  Normal Incontinent Weight: 65 kg Height:  5\' 5"  (165.1 cm)  BEHAVIORAL SYMPTOMS/MOOD NEUROLOGICAL BOWEL NUTRITION STATUS      Incontinent Diet(Heart Healthy)  AMBULATORY STATUS COMMUNICATION OF NEEDS Skin   Extensive Assist Verbally Normal                       Personal Care Assistance Level of Assistance  Dressing, Feeding, Bathing Bathing Assistance: Maximum assistance Feeding assistance: Maximum assistance Dressing Assistance: Maximum assistance     Functional Limitations Info             SPECIAL CARE FACTORS FREQUENCY  PT (By licensed PT), OT (By  licensed OT)                    Contractures Contractures Info: Not present    Additional Factors Info  Code Status, Allergies Code Status Info: Full Allergies Info: NKA           Current Medications (08/26/2019):  This is the current hospital active medication list Current Facility-Administered Medications  Medication Dose Route Frequency Provider Last Rate Last Admin  . 0.9 %  sodium chloride infusion   Intravenous Continuous Ivor Costa, MD 75 mL/hr at 08/26/19 1002 Other (enter comment in med admin window) at 08/26/19 1002  . acetaminophen (TYLENOL) tablet 650 mg  650 mg Oral Q6H PRN Ivor Costa, MD   650 mg at 08/25/19 2232  . apixaban (ELIQUIS) tablet 5 mg  5 mg Oral Q12H Patrecia Pour, MD   5 mg at 08/26/19 0949  . aspirin EC tablet 81 mg  81 mg Oral QHS Ivor Costa, MD   81 mg at 08/25/19 2232  . cholecalciferol (VITAMIN D3) tablet 1,000 Units  1,000 Units Oral Daily Ivor Costa, MD   1,000 Units at 08/26/19 0949  . diltiazem (CARDIZEM CD) 24 hr capsule 120 mg  120 mg Oral Daily Ivor Costa, MD   120 mg at 08/26/19 0949  . folic acid (FOLVITE) tablet 1 mg  1 mg Oral Daily Ivor Costa, MD   1 mg at 08/26/19 0949  . hydrALAZINE (APRESOLINE) injection 5 mg  5 mg Intravenous  Q2H PRN Ivor Costa, MD      . melatonin tablet 2.5 mg  2.5 mg Oral QHS Patrecia Pour, MD   2.5 mg at 08/25/19 2234  . methotrexate (RHEUMATREX) tablet 17.5 mg  17.5 mg Oral Q Sumner Boast, MD   17.5 mg at 08/24/19 1550  . multivitamin-lutein (OCUVITE-LUTEIN) capsule 1 capsule  1 capsule Oral Daily Ivor Costa, MD   1 capsule at 08/26/19 0950  . ondansetron (ZOFRAN) injection 4 mg  4 mg Intravenous Q8H PRN Ivor Costa, MD      . polymixin-bacitracin (POLYSPORIN) ointment   Topical BID Ivor Costa, MD   Given at 08/26/19 [phone]  . ramipril (ALTACE) capsule 10 mg  10 mg Oral Daily Ivor Costa, MD   10 mg at 08/26/19 0950  . simvastatin (ZOCOR) tablet 20 mg  20 mg Oral Daily Ivor Costa, MD   20 mg at 08/26/19 0949  .  vitamin B-12 (CYANOCOBALAMIN) tablet 1,000 mcg  1,000 mcg Oral Daily Ivor Costa, MD   1,000 mcg at 08/26/19 8406     Discharge Medications: Please see discharge summary for a list of discharge medications.  Relevant Imaging Results:  Relevant Lab Results:   Additional Information SSN [id number]  Shelbie Ammons, RN

## 2019-08-26 NOTE — TOC Progression Note (Signed)
Transition of Care [facility]) - Progression Note    Patient Details  Name: Angela Leonard MRN: [id number] Date of Birth: 1934-07-22  [facility] CM/SW Contact  Shelbie Ammons, RN Phone Number: 08/26/2019, 12:35 PM  Clinical Narrative:   RNCM spoke with daughter [name] by phone, she relays that she still wants to bring her mother home if at all possible but that she is open to a bed search being initiated. PASSR verified, FL2 completed and bed search initiated.     Expected Discharge Plan: [place] Barriers to Discharge: Continued Medical Work up  Expected Discharge Plan and Services Expected Discharge Plan: [place]   Discharge Planning Services: CM Consult Post Acute Care Choice: [place] Living arrangements for the past 2 months: Single Family Home                                       Social Determinants of Health (SDOH) Interventions    Readmission Risk Interventions No flowsheet data found.

## 2019-08-27 LAB — BASIC METABOLIC PANEL
Anion gap: 6 (ref 5–15)
BUN: 14 mg/dL (ref 8–23)
CO2: 25 mmol/L (ref 22–32)
Calcium: 8.1 mg/dL — ABNORMAL LOW (ref 8.9–10.3)
Chloride: 108 mmol/L (ref 98–111)
Creatinine, Ser: 0.73 mg/dL (ref 0.44–1.00)
GFR calc Af Amer: >60 mL/min (ref >60)
GFR calc non Af Amer: >60 mL/min (ref >60)
Glucose, Bld: 99 mg/dL (ref 70–99)
Potassium: 3.9 mmol/L (ref 3.5–5.1)
Sodium: 139 mmol/L (ref 135–145)

## 2019-08-27 LAB — GLUCOSE, CAPILLARY: Glucose-Capillary: 80 mg/dL (ref 70–99)

## 2019-08-27 MED ORDER — MELATONIN 5 MG PO TABS: 2.5000 mg | ORAL_TABLET | Freq: Every day | ORAL | 0 refills | Status: DC

## 2019-08-27 MED ORDER — DILTIAZEM HCL ER BEADS 180 MG PO CP24: 180.0000 mg | ORAL_CAPSULE | Freq: Every day | ORAL | 0 refills | Status: DC

## 2019-08-27 MED ORDER — DILTIAZEM HCL ER BEADS 180 MG PO CP24: 180.0000 mg | ORAL_CAPSULE | Freq: Every day | ORAL | Status: DC

## 2019-08-27 NOTE — Discharge Summary (Addendum)
Physician Discharge Summary  Angela Leonard GHW:[id number] DOB: 11/15/1934 DOA: 08/24/2019  PCP: Derinda Late, MD  Admit date: 08/24/2019 Discharge date: 08/27/2019  Admitted From: Home Disposition: HOME - See discussions below, SNF was recommended, but ultimately patient to return home.  Recommendations for Outpatient Follow-up:  1. Monitor HR, increased diltiazem modestly due to exertional tachycardia. 2. Please obtain BMP in one week 3. Recommend continued code status discussions  Home Health: PT, OT, RN, aide Equipment/Devices: None new Discharge Condition: Stable CODE STATUS: Full Diet recommendation: Heart healthy  Brief/Interim Summary: Angela Leonard is an 85 y.o. female with a history of HTN, HLD, AFib on eliquis, dementia, RA on MTX who presented from home after a fall 3/30. She lives with her daughter who reported the patient tripped over an object, no syncope or dizziness/gait dysfunction. Pt was found to have WBC 8.8, pyuria (UA hazy with rare bacteria, WBC 11-20), CK 79, potassium 3.4, renal function normal. Temperature normal, blood pressure 152/109, 119/103, heart rate 95, oxygen saturation 95% on room air Chest x-ray negative. CT of her head is negative for acute intracranial abnormalities, but showed hematoma in the left frontal area. CT of C-spine is negative for bony fracture, but showed degenerative disc disease. CT of her maxillofacial images negative for bony fracture. No bony fracture byx-ray of left hip/pelvis, x-ray of left humerus andx-ray of right hip. She was admitted for IVF and antibiotics though appeared to require more assistance for mobility than previously, so discharge to SNF was recommended. Was not initially eating and drinking, but this has improved and she no longer requires IV fluids. The patient's daughter has felt reluctant about discharge to facility and prefers to take the patient home. Additionally the patient was not changed to inpatient status  until the day after admission and has only spent 2 midnights as an inpatient (though has been in the hospital for nearly 4 midnights) so medicare would not cover SNF at this point anyway. We will maximize home health therapies.  Discharge Diagnoses:  Principal Problem:   Fall at home, initial encounter Active Problems:   HTN (hypertension)   HLD (hyperlipidemia)   Atrial fibrillation, chronic (HCC)   UTI (urinary tract infection)   Hypokalemia   RA (rheumatoid arthritis) ([place])   Fall   Facial laceration   Dehydration  UTI:  - Completed 3 days ceftriaxone (culture with insignificant growth). If fevers or develops symptoms again, recommend retesting and restarting Tx as she is on methotrexate.  Fall at home with facial laceration: s/p repair. Imaging negative for fracture/dislocation. No focal deficits. Laceration appears to be healing well with steri strips.  - Local wound care recommended.  Dementia complicated by acute delirium:  - Delirium precautions - Melatonin qHS  Degenerative joint disease: Noted on R > L hips on XR. No fracture or dislocation.   Chronic AFib with exertional RVR: CHA2DS2-VASc scoreis at least 4.  - Continue diltiazem, changed to 180mg   - Continue DOAC.  Hypokalemia: Mild. Supplemented and resolved.  Hypomagnesemia:  - Supplemented and monitor Mg  RA:  - Continue MTX/folate. This is cause of macrocytosis.   HLD:  - Continue statin  Discharge Instructions  Allergies as of 08/27/2019   No Known Allergies     Medication List    STOP taking these medications   aspirin EC 81 MG tablet     TAKE these medications   acetaminophen 325 MG tablet Commonly known as: TYLENOL Take 650 mg by mouth every 12 (twelve) hours as needed.  cholecalciferol 1000 units tablet Commonly known as: VITAMIN D Take 1,000 Units by mouth daily.   diltiazem 180 MG 24 hr capsule Commonly known as: TIAZAC Take 1 capsule (180 mg total) by mouth daily. What  changed:   medication strength  how much to take   Eliquis 5 MG Tabs tablet Generic drug: apixaban Take 5 mg by mouth every 12 (twelve) hours.   folic acid 1 MG tablet Commonly known as: FOLVITE Take 1 mg by mouth daily.   melatonin 5 MG Tabs Take 0.5 tablets (2.5 mg total) by mouth at bedtime.   methotrexate 2.5 MG tablet Commonly known as: RHEUMATREX Take 7 tablets by mouth every Tuesday.   multivitamin with minerals Tabs tablet Take 1 tablet by mouth daily.   multivitamin-lutein Caps capsule Take 1 capsule by mouth daily.   ramipril 10 MG capsule Commonly known as: ALTACE Take 10 mg by mouth daily.   simvastatin 20 MG tablet Commonly known as: ZOCOR Take 20 mg by mouth daily.   vitamin B-12 1000 MCG tablet Commonly known as: CYANOCOBALAMIN Take 1,000 mcg by mouth daily.      Follow-up Information    Derinda Late, MD. Schedule an appointment as soon as possible for a visit in 1 week(s).   Specialty: Family Medicine Contact information: [address] [facility] [place] [address] [phone]          No Known Allergies  Consultations:  None  Procedures/Studies: CT Head Wo Contrast  Result Date: 08/24/2019 CLINICAL DATA:  Fall, found down, left frontal swelling/hematoma, left eye bruising/laceration EXAM: CT HEAD WITHOUT CONTRAST CT MAXILLOFACIAL WITHOUT CONTRAST CT CERVICAL SPINE WITHOUT CONTRAST TECHNIQUE: Multidetector CT imaging of the head, cervical spine, and maxillofacial structures were performed using the standard protocol without intravenous contrast. Multiplanar CT image reconstructions of the cervical spine and maxillofacial structures were also generated. COMPARISON:  CT head/cervical spine dated 02/19/2019 FINDINGS: CT HEAD FINDINGS Brain: No evidence of acute infarction, hemorrhage, hydrocephalus, extra-axial collection or mass lesion/mass effect. Mild cortical atrophy. Extensive subcortical  white matter and periventricular small vessel ischemic changes. Vascular: Intracranial atherosclerosis. Skull: Normal. Negative for fracture or focal lesion. Other: Mild soft tissue swelling/hematoma/laceration overlying the left frontal bone (series 2/image 9). CT MAXILLOFACIAL FINDINGS Osseous: No evidence of maxillofacial fracture. Mandible is intact. Bilateral mandibular condyles are well-seated in the TMJs. Orbits: Bilateral orbits, including the globes and retroconal soft tissues, are within normal limits. Sinuses: Partial opacification with layering high density in the left maxillary sinus, likely reflecting inspissated secretions. Visualized paranasal sinuses and mastoid air cells are otherwise clear. Soft tissues: Mild soft tissue swelling/hematoma with laceration overlying the left frontal bone (series 2/image 1). Mild soft tissue swelling/bruising overlying the left maxilla (series 2/image 20). CT CERVICAL SPINE FINDINGS Alignment: Reversal of the normal cervical lordosis. Skull base and vertebrae: No acute fracture. No primary bone lesion or focal pathologic process. Soft tissues and spinal canal: No prevertebral fluid or swelling. No visible canal hematoma. Disc levels: Moderate degenerative changes of the mid/lower cervical spine. Mild narrowing of the spinal canal at C4-5, unchanged. Upper chest: Visualized lung apices are notable for emphysematous changes. Other: Visualized thyroid is unremarkable. IMPRESSION: Mild soft tissue swelling/hematoma/laceration overlying the left frontal bone. No evidence of calvarial fracture. No evidence of acute intracranial abnormality. Atrophy with small vessel ischemic changes. Mild soft tissue swelling/bruising overlying the left maxilla. No evidence of maxillofacial fracture. Chronic opacity in the left maxillary sinus. No evidence of  acute traumatic injury to the cervical spine. Moderate degenerative changes. Mild narrowing of the spinal canal at C4-5, unchanged.  Electronically Signed   By: Julian Hy M.D.   On: 08/24/2019 04:03   CT Cervical Spine Wo Contrast  Result Date: 08/24/2019 CLINICAL DATA:  Fall, found down, left frontal swelling/hematoma, left eye bruising/laceration EXAM: CT HEAD WITHOUT CONTRAST CT MAXILLOFACIAL WITHOUT CONTRAST CT CERVICAL SPINE WITHOUT CONTRAST TECHNIQUE: Multidetector CT imaging of the head, cervical spine, and maxillofacial structures were performed using the standard protocol without intravenous contrast. Multiplanar CT image reconstructions of the cervical spine and maxillofacial structures were also generated. COMPARISON:  CT head/cervical spine dated 02/19/2019 FINDINGS: CT HEAD FINDINGS Brain: No evidence of acute infarction, hemorrhage, hydrocephalus, extra-axial collection or mass lesion/mass effect. Mild cortical atrophy. Extensive subcortical white matter and periventricular small vessel ischemic changes. Vascular: Intracranial atherosclerosis. Skull: Normal. Negative for fracture or focal lesion. Other: Mild soft tissue swelling/hematoma/laceration overlying the left frontal bone (series 2/image 9). CT MAXILLOFACIAL FINDINGS Osseous: No evidence of maxillofacial fracture. Mandible is intact. Bilateral mandibular condyles are well-seated in the TMJs. Orbits: Bilateral orbits, including the globes and retroconal soft tissues, are within normal limits. Sinuses: Partial opacification with layering high density in the left maxillary sinus, likely reflecting inspissated secretions. Visualized paranasal sinuses and mastoid air cells are otherwise clear. Soft tissues: Mild soft tissue swelling/hematoma with laceration overlying the left frontal bone (series 2/image 1). Mild soft tissue swelling/bruising overlying the left maxilla (series 2/image 20). CT CERVICAL SPINE FINDINGS Alignment: Reversal of the normal cervical lordosis. Skull base and vertebrae: No acute fracture. No primary bone lesion or focal pathologic process. Soft  tissues and spinal canal: No prevertebral fluid or swelling. No visible canal hematoma. Disc levels: Moderate degenerative changes of the mid/lower cervical spine. Mild narrowing of the spinal canal at C4-5, unchanged. Upper chest: Visualized lung apices are notable for emphysematous changes. Other: Visualized thyroid is unremarkable. IMPRESSION: Mild soft tissue swelling/hematoma/laceration overlying the left frontal bone. No evidence of calvarial fracture. No evidence of acute intracranial abnormality. Atrophy with small vessel ischemic changes. Mild soft tissue swelling/bruising overlying the left maxilla. No evidence of maxillofacial fracture. Chronic opacity in the left maxillary sinus. No evidence of acute traumatic injury to the cervical spine. Moderate degenerative changes. Mild narrowing of the spinal canal at C4-5, unchanged. Electronically Signed   By: Julian Hy M.D.   On: 08/24/2019 04:03   DG Chest Portable 1 View  Result Date: 08/24/2019 CLINICAL DATA:  Fall with weakness EXAM: PORTABLE CHEST 1 VIEW COMPARISON:  [place]ine [place]seven FINDINGS: Chronic interstitial opacity. Normal heart size and mediastinal contours. No visible effusion or pneumothorax. IMPRESSION: 1. No acute finding when compared to prior. 2. Chronic interstitial opacity. Electronically Signed   By: Monte Fantasia M.D.   On: 08/24/2019 04:47   DG Humerus Left  Result Date: 08/24/2019 CLINICAL DATA:  [place]. Left arm pain. EXAM: LEFT HUMERUS - 2+ VIEW COMPARISON:  None. FINDINGS: The shoulder and elbow joints are grossly maintained. No acute fracture of the humerus is identified. IMPRESSION: No acute bony findings. Electronically Signed   By: Marijo Sanes M.D.   On: 08/24/2019 07:14   DG HIP UNILAT WITH PELVIS 2-3 VIEWS LEFT  Result Date: 08/24/2019 CLINICAL DATA:  [place]. Bilateral hip pain. EXAM: DG HIP (WITH OR WITHOUT PELVIS) 2-3V RIGHT; DG HIP (WITH OR WITHOUT PELVIS) 2-3V LEFT COMPARISON:  09/15/2017  FINDINGS: Both hips are normally located. Stable degenerative changes, right greater than left. No acute  hip fracture is identified. The pubic symphysis and SI joints are intact. No definite pelvic fractures. Stable vascular calcifications. IMPRESSION: Stable degenerative changes but no acute hip or pelvic fracture. Electronically Signed   By: Marijo Sanes M.D.   On: 08/24/2019 07:13   DG HIP UNILAT WITH PELVIS 2-3 VIEWS RIGHT  Result Date: 08/24/2019 CLINICAL DATA:  [place]. Bilateral hip pain. EXAM: DG HIP (WITH OR WITHOUT PELVIS) 2-3V RIGHT; DG HIP (WITH OR WITHOUT PELVIS) 2-3V LEFT COMPARISON:  09/15/2017 FINDINGS: Both hips are normally located. Stable degenerative changes, right greater than left. No acute hip fracture is identified. The pubic symphysis and SI joints are intact. No definite pelvic fractures. Stable vascular calcifications. IMPRESSION: Stable degenerative changes but no acute hip or pelvic fracture. Electronically Signed   By: Marijo Sanes M.D.   On: 08/24/2019 07:13   CT Maxillofacial Wo Contrast  Result Date: 08/24/2019 CLINICAL DATA:  Fall, found down, left frontal swelling/hematoma, left eye bruising/laceration EXAM: CT HEAD WITHOUT CONTRAST CT MAXILLOFACIAL WITHOUT CONTRAST CT CERVICAL SPINE WITHOUT CONTRAST TECHNIQUE: Multidetector CT imaging of the head, cervical spine, and maxillofacial structures were performed using the standard protocol without intravenous contrast. Multiplanar CT image reconstructions of the cervical spine and maxillofacial structures were also generated. COMPARISON:  CT head/cervical spine dated 02/19/2019 FINDINGS: CT HEAD FINDINGS Brain: No evidence of acute infarction, hemorrhage, hydrocephalus, extra-axial collection or mass lesion/mass effect. Mild cortical atrophy. Extensive subcortical white matter and periventricular small vessel ischemic changes. Vascular: Intracranial atherosclerosis. Skull: Normal. Negative for fracture or focal lesion. Other:  Mild soft tissue swelling/hematoma/laceration overlying the left frontal bone (series 2/image 9). CT MAXILLOFACIAL FINDINGS Osseous: No evidence of maxillofacial fracture. Mandible is intact. Bilateral mandibular condyles are well-seated in the TMJs. Orbits: Bilateral orbits, including the globes and retroconal soft tissues, are within normal limits. Sinuses: Partial opacification with layering high density in the left maxillary sinus, likely reflecting inspissated secretions. Visualized paranasal sinuses and mastoid air cells are otherwise clear. Soft tissues: Mild soft tissue swelling/hematoma with laceration overlying the left frontal bone (series 2/image 1). Mild soft tissue swelling/bruising overlying the left maxilla (series 2/image 20). CT CERVICAL SPINE FINDINGS Alignment: Reversal of the normal cervical lordosis. Skull base and vertebrae: No acute fracture. No primary bone lesion or focal pathologic process. Soft tissues and spinal canal: No prevertebral fluid or swelling. No visible canal hematoma. Disc levels: Moderate degenerative changes of the mid/lower cervical spine. Mild narrowing of the spinal canal at C4-5, unchanged. Upper chest: Visualized lung apices are notable for emphysematous changes. Other: Visualized thyroid is unremarkable. IMPRESSION: Mild soft tissue swelling/hematoma/laceration overlying the left frontal bone. No evidence of calvarial fracture. No evidence of acute intracranial abnormality. Atrophy with small vessel ischemic changes. Mild soft tissue swelling/bruising overlying the left maxilla. No evidence of maxillofacial fracture. Chronic opacity in the left maxillary sinus. No evidence of acute traumatic injury to the cervical spine. Moderate degenerative changes. Mild narrowing of the spinal canal at C4-5, unchanged. Electronically Signed   By: Julian Hy M.D.   On: 08/24/2019 04:03    Subjective: Confused but denies pain, dizziness, chest pain, dyspnea, palpitations  or any other complaints.  Discharge Exam: Vitals:   08/27/19 0700 08/27/19 0734  BP:  139/82  Pulse: (!) 47 63  Resp: 18 18  Temp:  98.2 F (36.8 C)  SpO2: 98%    General: Pt is alert, awake, not in acute distress Cardiovascular: RRR, S1/S2 +, no rubs, no gallops Respiratory: CTA bilaterally, no wheezing, no rhonchi  Abdominal: Soft, NT, ND, bowel sounds + Extremities: No edema, no cyanosis Skin: Left periorbital ecchymosis stable-to-decreasing in size. Left lateral frontal ecchymoses on face improving, laceration is stable, well apposed edges, shallow.  Labs: Basic Metabolic Panel: Recent Labs  Lab 08/24/19 0330 08/25/19 0432 08/26/19 0348 08/27/19 0414  NA 140 142 140 139  K 3.4* 3.9 3.8 3.9  CL 103 112* 110 108  CO2 24 24 21* 25  GLUCOSE 106* 96 83 99  BUN 17 15 16 14   CREATININE 0.94 0.71 0.66 0.73  CALCIUM 9.5 8.7* 8.0* 8.1*  MG  --  1.5* 2.0  --    Liver Function Tests: No results for input(s): AST, ALT, ALKPHOS, BILITOT, PROT, ALBUMIN in the last 168 hours. No results for input(s): LIPASE, AMYLASE in the last 168 hours. No results for input(s): AMMONIA in the last 168 hours. CBC: Recent Labs  Lab 08/24/19 0330 08/25/19 0432  WBC 8.8 7.8  NEUTROABS 6.7  --   HGB 15.4* 14.8  HCT 46.3* 43.2  MCV 102.4* 100.9*  PLT 246 226   Cardiac Enzymes: Recent Labs  Lab 08/24/19 0330  CKTOTAL 79   BNP: Invalid input(s): POCBNP CBG: Recent Labs  Lab 08/25/19 0736 08/26/19 0736 08/27/19 0723  GLUCAP 94 70 80   D-Dimer No results for input(s): DDIMER in the last 72 hours. Hgb A1c No results for input(s): HGBA1C in the last 72 hours. Lipid Profile No results for input(s): CHOL, HDL, LDLCALC, TRIG, CHOLHDL, LDLDIRECT in the last 72 hours. Thyroid function studies No results for input(s): TSH, T4TOTAL, T3FREE, THYROIDAB in the last 72 hours.  Invalid input(s): FREET3 Anemia work up No results for input(s): VITAMINB12, FOLATE, FERRITIN, TIBC, IRON,  RETICCTPCT in the last 72 hours. Urinalysis    Component Value Date/Time   COLORURINE YELLOW (A) 08/24/2019 0330   APPEARANCEUR HAZY (A) 08/24/2019 0330   LABSPEC 1.021 08/24/2019 0330   PHURINE 5.0 08/24/2019 0330   GLUCOSEU NEGATIVE 08/24/2019 0330   HGBUR SMALL (A) 08/24/2019 0330   BILIRUBINUR NEGATIVE 08/24/2019 0330   KETONESUR 20 (A) 08/24/2019 0330   PROTEINUR 30 (A) 08/24/2019 0330   NITRITE NEGATIVE 08/24/2019 0330   LEUKOCYTESUR SMALL (A) 08/24/2019 0330    Microbiology Recent Results (from the past 240 hour(s))  Urine culture     Status: Abnormal   Collection Time: 08/24/19  3:30 AM   Specimen: Urine, Clean Catch  Result Value Ref Range Status   Specimen Description   Final    URINE, CLEAN CATCH Performed at [facility], [address], [place], [place] [address]    Special Requests   Final    Normal Performed at [facility], [address], [place], [place] [address]    Culture (A)  Final    <10,000 COLONIES/mL INSIGNIFICANT GROWTH Performed at [facility], [place] [address], [place], [place] [address]    Report Status 08/25/2019 FINAL  Final  SARS CORONAVIRUS 2 (TAT 6-24 HRS) Nasopharyngeal Nasopharyngeal Swab     Status: None   Collection Time: 08/24/19  7:08 AM   Specimen: Nasopharyngeal Swab  Result Value Ref Range Status   SARS Coronavirus 2 NEGATIVE NEGATIVE Final    Comment: (NOTE) SARS-CoV-2 target nucleic acids are NOT DETECTED. The SARS-CoV-2 RNA is generally detectable in upper and lower respiratory specimens during the acute phase of infection. Negative results do not preclude SARS-CoV-2 infection, do not rule out co-infections with other pathogens, and should not be used as the sole  basis for treatment or other patient management decisions. Negative results must be combined with clinical observations, patient history, and epidemiological information. The expected result is Negative. Fact Sheet for  Patients: SugarRoll.be Fact Sheet for Healthcare Providers: https://www.woods-mathews.com/ This test is not yet approved or cleared by the [place] FDA and  has been authorized for detection and/or diagnosis of SARS-CoV-2 by FDA under an Emergency Use Authorization (EUA). This EUA will remain  in effect (meaning this test can be used) for the duration of the COVID-19 declaration under Section 56 4(b)(1) of the Act, 21 U.S.C. section 360bbb-3(b)(1), unless the authorization is terminated or revoked sooner. Performed at [facility], [employer] [address], [place], [place] [address]     Time coordinating discharge: Approximately 40 minutes  Patrecia Pour, MD  [facility] 08/27/2019, 12:11 PM

## 2019-08-27 NOTE — Care Management Important Message (Signed)
Important Message  Patient Details  Name: Angela Leonard MRN: [id number] Date of Birth: 1935/04/05   Medicare Important Message Given:  N/A - LOS <3 / Initial given by admissions     Angela Leonard 08/27/2019, 7:39 AM

## 2019-08-27 NOTE — TOC Progression Note (Addendum)
Transition of Care [facility]) - Progression Note    Patient Details  Name: Angela Leonard MRN: [id number] Date of Birth: 1934-08-24  Transition of Care [facility]) CM/SW Contact  Anselm Pancoast, RN Phone Number: 08/27/2019, 1:24 PM  Clinical Narrative:    Damaris Schooner to daughter, [name], confirmed patient would be discharging home today with home health care. Daughter was pleased with being able to take her home. RN CM outreached to [facility] agencies and [facility] had no availability, [facility] could not see her until next week. Accepted by [place] @  [facility]. Daughter and nurse updated on [facility] agency.    Expected Discharge Plan: [place] Barriers to Discharge: Continued Medical Work up  Expected Discharge Plan and Services Expected Discharge Plan: [place]   Discharge Planning Services: CM Consult Post Acute Care Choice: [place] arrangements for the past 2 months: Single Family Home Expected Discharge Date: 08/27/19                                     Social Determinants of Health (SDOH) Interventions    Readmission Risk Interventions No flowsheet data found.

## 2019-08-27 NOTE — Care Management Important Message (Signed)
Important Message  Patient Details  Name: Angela Leonard MRN: [id number] Date of Birth: [birth year]   Medicare Important Message Given:  N/A - LOS <3 / Initial given by admissions     [name] 08/27/2019, 7:38 AM

## 2019-08-27 NOTE — TOC Progression Note (Signed)
Transition of Care [facility]) - Progression Note    Patient Details  Name: Melvenia Favela MRN: [id number] Date of Birth: [birth year]  Transition of [facility]) CM/SW Contact  Meriel Flavors, LCSW Phone Number: 08/27/2019, 5:09 PM  Clinical Narrative:    Patient's daughter is unable to transport due to no longer driving.  Arranged transport with [facility]) [phone] estimated pick up time 5:30 Paperwork for transport placed on pt's chart and CSW notified Nurse     Expected Discharge Plan: [place] Barriers to Discharge: Continued Medical Work up  Expected Discharge Plan and Services Expected Discharge Plan: [place]   Discharge Planning Services: CM Consult Post Acute Care Choice: [place] arrangements for the past 2 months: Single Family Home Expected Discharge Date: 08/27/19                                     Social Determinants of Health (SDOH) Interventions    Readmission Risk Interventions No flowsheet data found.

## 2019-09-15 ENCOUNTER — Ambulatory Visit: Payer: Medicare Other | Attending: Internal Medicine

## 2019-09-15 DIAGNOSIS — Z23 Encounter for immunization: Secondary | ICD-10-CM

## 2019-09-15 NOTE — Progress Notes (Signed)
   Covid-19 Vaccination Clinic  Name:  [name]    MRN: [id number] DOB: [birth year]  09/15/2019  [name] was observed post Covid-19 immunization for 15 minutes without incident. She was provided with Vaccine Information Sheet and instruction to access the V-Safe system.   [name] was instructed to call 911 with any severe reactions post vaccine: Mar[place] Kitchen Difficulty breathing  . Swelling of face and throat  . A fast heartbeat  . A bad rash all over body  . Dizziness and weakness   Immunizations Administered    Name Date Dose VIS Date Route   Pfizer COVID-19 Vaccine 09/15/2019  1:52 PM 0.3 mL 07/21/2018 Intramuscular   Manufacturer: [employer], [employer]   Lot: [id number]   [place]: [id number]

## 2019-09-20 ENCOUNTER — Other Ambulatory Visit: Payer: Medicare Other | Admitting: Nurse Practitioner

## 2019-09-20 ENCOUNTER — Encounter: Payer: Self-pay | Admitting: Nurse Practitioner

## 2019-09-20 ENCOUNTER — Other Ambulatory Visit: Payer: Self-pay

## 2019-09-20 DIAGNOSIS — Z515 Encounter for palliative care: Secondary | ICD-10-CM

## 2019-09-20 DIAGNOSIS — M069 Rheumatoid arthritis, unspecified: Secondary | ICD-10-CM

## 2019-09-20 NOTE — Progress Notes (Signed)
Angela Leonard Telephone: [phone]  Fax: [phone]  PATIENT NAME: Angela Leonard DOB: Jul 09, 1934 MRN: [id number]  PRIMARY CARE PROVIDER:   Derinda Late, MD  REFERRING PROVIDER:  Derinda Late, MD [phone] [facility] [place],  [place] [address]  Due to the COVID-19 crisis, this visit was done via telemedicine from my office and it was initiated and consent by this patient and or family.  RESPONSIBLE PARTY:Angela Leonard-self; Angela Leonard daughter  RECOMMENDATIONS and PLAN: 1.ACP; Made DNR, in Epic/Vynca  2.Chronic pain monitor on pain scale, monitor efficacy vs adverse side effects. Currently takingtylenol as needed, methotrexate increased to continue to follow by Rheumatology  3.Palliative care encounter Palliative medicine team will continue to support patient, patient's family, and medical team. Visit consisted of counseling and education dealing with the complex and emotionally intense issues of symptom management and palliative care in the setting of serious and potentially life-threatening illness  I spent 50 minutes providing this consultation,  from 12:00pm to 12:50pm. More than 50% of the time in this consultation was spent coordinating communication.   HISTORY OF PRESENT ILLNESS:  Angela Leonard is a 85 y.o. year old female with multiple medical problems including Atrial fibrillation, chronic kidney disease, Rheumatoid arthritis, hypertension, hyperlipidemia, osteoporosis, chicken pox, bilateral cataracts, cholecystectomy 2007, back surgery 2006. Hospitalized 08/24/2019 to 08/27/2019 for fall with facial laceration s/p repair; UTI received antibiotics; dementia complicated by acute delirium, chronic atrial fib with exertional RVR, continued diltiazem. I called Angela Leonard's daughter Angela Leonard for schedule telemedicine telephonic is video not available. We talked about  purpose of visit. Angela Leonard an agreement. We talked about how Angela. Leonard has been doing since she has been home. Angela Leonard endorses she is sleeping more. Angela. Leonard is able to ambulate with her walker and does continue to work with PT / OT at home. 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 Angela Leonard endorses Angela. Shur continues to be able to dressed herself but it does take some time. Angela Leonard feeds herself and appetite has been relatively for over the last two weeks. Angela Leonard endorses Angela. Leonard will eat soup and an occasional hamburger. Angela Leonard endorses Angela. Leonard does see an overall decline since hospitalization. We talked about medical goals of care. We talked about Angela. Leonard having more confusion where she does not recognize family members like she had in the past and she's getting her daughter mixed up. We talked about chronic disease progression. We talked about follow up palliative care visit in two weeks when physical therapy is about completed. Discuss with Angela Leonard at that time will reassess clinical condition and decline. Angela Leonard an agreement. Discuss that scheduled in-person palliative care visit. Therapeutic listening, emotional support provided. Contact information. Questions answered to satisfaction. Palliative Care was asked to help to continue to address goals of care.   CODE STATUS: DNR  PPS: 40% HOSPICE ELIGIBILITY/DIAGNOSIS: TBD  PAST MEDICAL HISTORY:  Past Medical History:  Diagnosis Date  . HLD (hyperlipidemia)   . Hypertension     SOCIAL HX:  Social History   Tobacco Use  . Smoking status: Former Research scientist (life sciences)  . Smokeless tobacco: Never Used  Substance Use Topics  . Alcohol use: No    ALLERGIES: No Known Allergies   PERTINENT MEDICATIONS:  Outpatient Encounter Medications as of 09/20/2019  Medication Sig  . acetaminophen (TYLENOL) 325 MG tablet Take 650 mg by mouth every 12 (twelve) hours as needed.  Marland Kitchen  cholecalciferol (VITAMIN D) 1000 units  tablet Take 1,000 Units by mouth daily.  Marland Kitchen diltiazem (TIAZAC) 180 MG 24 hr capsule Take 1 capsule (180 mg total) by mouth daily.  Marland Kitchen ELIQUIS 5 MG TABS tablet Take 5 mg by mouth every 12 (twelve) hours.  . folic acid (FOLVITE) 1 MG tablet Take 1 mg by mouth daily.  . methotrexate (RHEUMATREX) 2.5 MG tablet Take 7 tablets by mouth every Tuesday.   . Multiple Vitamin (MULTIVITAMIN WITH MINERALS) TABS tablet Take 1 tablet by mouth daily.  . multivitamin-lutein (OCUVITE-LUTEIN) CAPS capsule Take 1 capsule by mouth daily.  . ramipril (ALTACE) 10 MG capsule Take 10 mg by mouth daily.  . simvastatin (ZOCOR) 20 MG tablet Take 20 mg by mouth daily.   . vitamin B-12 (CYANOCOBALAMIN) 1000 MCG tablet Take 1,000 mcg by mouth daily.   No facility-administered encounter medications on file as of 09/20/2019.    PHYSICAL EXAM:   Deferred  Caetano Oberhaus Z Briseidy Spark, NP

## 2019-09-24 ENCOUNTER — Emergency Department
Admission: EM | Admit: 2019-09-24 | Discharge: 2019-09-25 | Disposition: A | Discharge status: Home / Self Care | Payer: Medicare Other | Attending: Emergency Medicine | Admitting: Emergency Medicine

## 2019-09-24 ENCOUNTER — Other Ambulatory Visit: Payer: Self-pay

## 2019-09-24 ENCOUNTER — Emergency Department: Payer: Medicare Other

## 2019-09-24 DIAGNOSIS — Z79899 Other long term (current) drug therapy: Secondary | ICD-10-CM | POA: Insufficient documentation

## 2019-09-24 DIAGNOSIS — I10 Essential (primary) hypertension: Secondary | ICD-10-CM | POA: Insufficient documentation

## 2019-09-24 DIAGNOSIS — M25552 Pain in left hip: Secondary | ICD-10-CM | POA: Diagnosis not present

## 2019-09-24 DIAGNOSIS — Z87891 Personal history of nicotine dependence: Secondary | ICD-10-CM | POA: Insufficient documentation

## 2019-09-24 DIAGNOSIS — W19XXXA Unspecified fall, initial encounter: Secondary | ICD-10-CM

## 2019-09-24 DIAGNOSIS — R531 Weakness: Secondary | ICD-10-CM | POA: Diagnosis not present

## 2019-09-24 DIAGNOSIS — W010XXA Fall on same level from slipping, tripping and stumbling without subsequent striking against object, initial encounter: Secondary | ICD-10-CM | POA: Insufficient documentation

## 2019-09-24 DIAGNOSIS — Z9049 Acquired absence of other specified parts of digestive tract: Secondary | ICD-10-CM | POA: Diagnosis not present

## 2019-09-24 DIAGNOSIS — Z7901 Long term (current) use of anticoagulants: Secondary | ICD-10-CM | POA: Diagnosis not present

## 2019-09-24 LAB — CBC WITH DIFFERENTIAL/PLATELET
Abs Immature Granulocytes: 0.06 10*3/uL (ref 0.00–0.07)
Basophils Absolute: 0.1 10*3/uL (ref 0.0–0.1)
Basophils Relative: 1 %
Eosinophils Absolute: 0.3 10*3/uL (ref 0.0–0.5)
Eosinophils Relative: 3 %
HCT: 44.2 % (ref 36.0–46.0)
Hemoglobin: 14.8 g/dL (ref 12.0–15.0)
Immature Granulocytes: 1 %
Lymphocytes Relative: 18 %
Lymphs Abs: 1.9 10*3/uL (ref 0.7–4.0)
MCH: 34 pg (ref 26.0–34.0)
MCHC: 33.5 g/dL (ref 30.0–36.0)
MCV: 101.6 fL — ABNORMAL HIGH (ref 80.0–100.0)
Monocytes Absolute: 0.4 10*3/uL (ref 0.1–1.0)
Monocytes Relative: 4 %
Neutro Abs: 8 10*3/uL — ABNORMAL HIGH (ref 1.7–7.7)
Neutrophils Relative %: 73 %
Platelets: 269 10*3/uL (ref 150–400)
RBC: 4.35 MIL/uL (ref 3.87–5.11)
RDW: 14.8 % (ref 11.5–15.5)
WBC: 10.6 10*3/uL — ABNORMAL HIGH (ref 4.0–10.5)
nRBC: 0 % (ref 0.0–0.2)

## 2019-09-24 LAB — COMPREHENSIVE METABOLIC PANEL
ALT: 20 U/L (ref 0–44)
AST: 34 U/L (ref 15–41)
Albumin: 3 g/dL — ABNORMAL LOW (ref 3.5–5.0)
Alkaline Phosphatase: 79 U/L (ref 38–126)
Anion gap: 4 — ABNORMAL LOW (ref 5–15)
BUN: 21 mg/dL (ref 8–23)
CO2: 27 mmol/L (ref 22–32)
Calcium: 8.7 mg/dL — ABNORMAL LOW (ref 8.9–10.3)
Chloride: 106 mmol/L (ref 98–111)
Creatinine, Ser: 0.7 mg/dL (ref 0.44–1.00)
GFR calc Af Amer: >60 mL/min (ref >60)
GFR calc non Af Amer: >60 mL/min (ref >60)
Glucose, Bld: 120 mg/dL — ABNORMAL HIGH (ref 70–99)
Potassium: 4 mmol/L (ref 3.5–5.1)
Sodium: 137 mmol/L (ref 135–145)
Total Bilirubin: 0.8 mg/dL (ref 0.3–1.2)
Total Protein: 7.3 g/dL (ref 6.5–8.1)

## 2019-09-24 LAB — TROPONIN I (HIGH SENSITIVITY): Troponin I (High Sensitivity): 9 ng/L (ref <18)

## 2019-09-24 MED ORDER — SODIUM CHLORIDE 0.9 % IV BOLUS
500.0000 mL | Freq: Once | INTRAVENOUS | Status: AC
  Administered 2019-09-25: 500 mL via INTRAVENOUS

## 2019-09-24 NOTE — ED Triage Notes (Signed)
First Nurse Note: patient brought in by ems from home. patient with complaint of left leg pain, back pain and weakness times one week. Patient fell a week ago and has had the pain since. fsbs 146 per ems.

## 2019-09-24 NOTE — ED Triage Notes (Signed)
Pt cannot tell this writer why she is here other than she thinks she fell today injuring her left hip. When reviewing pt's record, she was recently diagnosed with UTI after falling. Pt able to move all extremities, complains of buttock and left hip pain. perrl 82mm.

## 2019-09-24 NOTE — ED Notes (Signed)
ekg rate 133, however pt with hr irregular 74-82 when palpated.

## 2019-09-24 NOTE — ED Notes (Signed)
Pt to CT

## 2019-09-24 NOTE — ED Provider Notes (Signed)
[facility] Emergency Department Provider Note   ____________________________________________   First MD Initiated Contact with Patient 09/24/19 2301     (approximate)  I have reviewed the triage vital signs and the nursing notes.   HISTORY  Chief Complaint Fall    HPI Angela Leonard is a 85 y.o. female brought to the ED via EMS from home status post fall with left hip pain.  Patient has a history of atrial fibrillation on Eliquis, hypertension, rheumatoid arthritis who was hospitalized approximately 1 month ago for similar events and found to have UTI.  Patient is unclear on the details but states she was in the kitchen and fell onto her left hip.  Thinks she might of struck her head but denies LOC.  Complains of left hip and buttock pain.  States she was able to get up and ambulate after the fall.  Denies headache, neck pain, vision changes, chest pain, abdominal pain, nausea, vomiting or diarrhea.       Past Medical History:  Diagnosis Date  . HLD (hyperlipidemia)   . Hypertension     Patient Active Problem List   Diagnosis Date Noted  . Dehydration 08/25/2019  . UTI (urinary tract infection) 08/24/2019  . Fall at home, initial encounter 08/24/2019  . Hypokalemia 08/24/2019  . RA (rheumatoid arthritis) ([place]) 08/24/2019  . Fall 08/24/2019  . Facial laceration   . Atrial fibrillation, chronic ([place]) 02/19/2019  . Sepsis ([place]) 08/03/2018  . Atypical pneumonia 08/03/2018  . Arthritis of right hip 08/03/2018  . HTN (hypertension) 08/03/2018  . HLD (hyperlipidemia) 08/03/2018  . AKI (acute kidney injury) ([place]) 09/15/2017  . Symptomatic cholelithiasis 10/13/2014    Past Surgical History:  Procedure Laterality Date  . BACK SURGERY    . CHOLECYSTECTOMY N/A 10/14/2014   Procedure: LAPAROSCOPIC CHOLECYSTECTOMY;  Surgeon: Marlyce Huge, MD;  Location: [facility] ORS;  Service: General;  Laterality: N/A;  . COLON SURGERY      Prior to Admission  medications   Medication Sig Start Date End Date Taking? Authorizing Provider  acetaminophen (TYLENOL) 325 MG tablet Take 650 mg by mouth every 12 (twelve) hours as needed.    [provider]  cholecalciferol (VITAMIN D) 1000 units tablet Take 1,000 Units by mouth daily.    [provider]  diltiazem (TIAZAC) 180 MG 24 hr capsule Take 1 capsule (180 mg total) by mouth daily. 08/27/19   Patrecia Pour, MD  ELIQUIS 5 MG TABS tablet Take 5 mg by mouth every 12 (twelve) hours.    [provider]  folic acid (FOLVITE) 1 MG tablet Take 1 mg by mouth daily.    [provider]  methotrexate (RHEUMATREX) 2.5 MG tablet Take 7 tablets by mouth every Tuesday.     [provider]  Multiple Vitamin (MULTIVITAMIN WITH MINERALS) TABS tablet Take 1 tablet by mouth daily.    [provider]  multivitamin-lutein (OCUVITE-LUTEIN) CAPS capsule Take 1 capsule by mouth daily.    [provider]  ramipril (ALTACE) 10 MG capsule Take 10 mg by mouth daily.    [provider]  simvastatin (ZOCOR) 20 MG tablet Take 20 mg by mouth daily.     [provider]  vitamin B-12 (CYANOCOBALAMIN) 1000 MCG tablet Take 1,000 mcg by mouth daily.    [provider]    Allergies Patient has no known allergies.  Family History  Problem Relation Age of Onset  . Alzheimer's disease Mother   . Parkinson's disease Mother   .  Cancer Father   . Heart disease Sister   . Parkinson's disease Brother   . Heart disease Brother     Social History Social History   Tobacco Use  . Smoking status: Former Research scientist (life sciences)  . Smokeless tobacco: Never Used  Substance Use Topics  . Alcohol use: No  . Drug use: No    Review of Systems  Constitutional: No fever/chills Eyes: No visual changes. ENT: No sore throat. Cardiovascular: Denies chest pain. Respiratory: Denies shortness of breath. Gastrointestinal: No abdominal pain.  No nausea, no vomiting.  No  diarrhea.  No constipation. Genitourinary: Negative for dysuria. Musculoskeletal: Positive for left hip pain.  Negative for back pain. Skin: Negative for rash. Neurological: Negative for headaches, focal weakness or numbness.   ____________________________________________   PHYSICAL EXAM:  VITAL SIGNS: ED Triage Vitals  Enc Vitals Group     BP 09/24/19 2205 (!) 182/111     Pulse Rate 09/24/19 2205 78     Resp 09/24/19 2205 14     Temp 09/24/19 2205 97.9 F (36.6 C)     Temp Source 09/24/19 2205 Oral     SpO2 09/24/19 2205 100 %     Weight 09/24/19 2206 150 lb (68 kg)     Height 09/24/19 2206 5\' 5"  (1.651 m)     Head Circumference --      Peak Flow --      Pain Score --      Pain Loc --      Pain Edu? --      Excl. in [place]? --     Constitutional: Alert and oriented. Well appearing and in no acute distress. Eyes: Conjunctivae are normal. PERRL. EOMI. Head: Atraumatic.  Old left facial bruising from prior fall. Nose: Atraumatic. Mouth/Throat: Mucous membranes are moist.  No dental malocclusion.  Neck: No stridor.  No cervical spine tenderness to palpation. Cardiovascular: Tachycardic rate, irregular rhythm. Grossly normal heart sounds.  Good peripheral circulation. Respiratory: Normal respiratory effort.  No retractions. Lungs CTAB. Gastrointestinal: Soft and nontender to light or deep palpation. No distention. No abdominal bruits. No CVA tenderness. Musculoskeletal: Spinal tenderness to palpation.  Pelvis stable.  Left hip tender to palpation and with range of motion.  Left hip is not shortened nor externally rotated.  2+ distal pulses.  Brisk, less than 5-second capillary refill.   Neurologic:  Normal speech and language. No gross focal neurologic deficits are appreciated.  Skin:  Skin is warm, dry and intact. No rash noted. Psychiatric: Mood and affect are normal. Speech and behavior are normal.  ____________________________________________   LABS (all labs ordered are  listed, but only abnormal results are displayed)  Labs Reviewed  CBC WITH DIFFERENTIAL/PLATELET - Abnormal; Notable for the following components:      Result Value   WBC 10.6 (*)    MCV 101.6 (*)    Neutro Abs 8.0 (*)    All other components within normal limits  COMPREHENSIVE METABOLIC PANEL - Abnormal; Notable for the following components:   Glucose, Bld 120 (*)    Calcium 8.7 (*)    Albumin 3.0 (*)    Anion gap 4 (*)    All other components within normal limits  URINALYSIS, COMPLETE (UACMP) WITH MICROSCOPIC - Abnormal; Notable for the following components:   Color, Urine YELLOW (*)    APPearance CLEAR (*)    Hgb urine dipstick SMALL (*)    All other components within normal limits  TROPONIN I (HIGH SENSITIVITY)  TROPONIN I (HIGH  SENSITIVITY)   ____________________________________________  EKG  ED ECG REPORT I, Avel Ogawa J, the attending physician, personally viewed and interpreted this ECG.   Date: 09/24/2019  EKG Time: 2208  Rate: 133 - slower when palpated  Rhythm: atrial fibrillation, rate 133  Axis: Normal  Intervals:none  ST&T Change: Nonspecific  ____________________________________________  RADIOLOGY  ED MD interpretation: No acute fracture or dislocation on x-ray; no ICH, no CT evidence of fracture or dislocation  Official radiology report(s): CT Head Wo Contrast  Result Date: 09/25/2019 CLINICAL DATA:  History of recent fall with left leg pain, back pain and weakness. EXAM: CT HEAD WITHOUT CONTRAST TECHNIQUE: Contiguous axial images were obtained from the base of the skull through the vertex without intravenous contrast. COMPARISON:  August 24, 2019 FINDINGS: Brain: There is moderate severity cerebral atrophy with widening of the extra-axial spaces and ventricular dilatation. There are areas of decreased attenuation within the white matter tracts of the supratentorial brain, consistent with microvascular disease changes. Vascular: No hyperdense vessel or  unexpected calcification. Skull: No evidence of acute osseous abnormality. Stable 1.3 cm x 0.9 cm and 0.7 cm x 0.7 cm lytic areas are seen within the left frontal region of the skull. Sinuses/Orbits: No acute finding. Other: None. IMPRESSION: 1. Generalized cerebral atrophy. 2. No acute intracranial abnormality. Electronically Signed   By: Virgina Norfolk M.D.   On: 09/25/2019 00:25   CT Hip Left Wo Contrast  Result Date: 09/25/2019 CLINICAL DATA:  Left leg pain with history of recent fall. EXAM: CT OF THE LEFT HIP WITHOUT CONTRAST TECHNIQUE: Multidetector CT imaging of the left hip was performed according to the standard protocol. Multiplanar CT image reconstructions were also generated. COMPARISON:  None. FINDINGS: Bones/Joint/Cartilage There is no evidence of acute fracture or dislocation. No lytic or blastic lesions are identified. There is no evidence of cortical destruction or acute periosteal reaction. Mild degenerative changes seen involving the left hip. Ligaments Suboptimally assessed by CT. Muscles and Tendons Muscles and tendons appear to be intact without evidence of intramuscular hematoma. Soft tissues Soft tissue structures are unremarkable. Of incidental note is the presence of surgically anastomosed bowel within the region of the mid sigmoid colon. Noninflamed diverticula are seen throughout the remainder of the sigmoid colon. A large amount of stool is seen within the distal sigmoid colon and rectum. IMPRESSION: 1. No evidence of acute fracture or dislocation. 2. Mild degenerative changes involving the left hip. Electronically Signed   By: Virgina Norfolk M.D.   On: 09/25/2019 00:31   DG HIP UNILAT WITH PELVIS 2-3 VIEWS LEFT  Result Date: 09/24/2019 CLINICAL DATA:  Left leg pain, back pain and weakness x1 week. EXAM: DG HIP (WITH OR WITHOUT PELVIS) 2-3V LEFT COMPARISON:  August 24, 2019 FINDINGS: There is no evidence of an acute hip fracture or dislocation. Mild to moderate severity  sclerotic degenerative changes seen involving both hips. Mild narrowing of the left hip joint is seen with moderate severity narrowing of the right hip joint. Mild to moderate severity vascular calcification is seen. IMPRESSION: 1. No acute osseous abnormality. 2. Mild to moderate severity degenerative changes of both hips, right greater than left. Electronically Signed   By: Virgina Norfolk M.D.   On: 09/24/2019 23:07    ____________________________________________   PROCEDURES  Procedure(s) performed (including Critical Care):  .1-3 Lead EKG Interpretation Performed by: Paulette Blanch, MD Authorized by: Paulette Blanch, MD     Interpretation: abnormal     ECG rate:  106  ECG rate assessment: tachycardic     Rhythm: atrial fibrillation     Ectopy: none     Conduction: normal   Comments:     Patient placed on cardiac monitor to monitor for arrhythmias     ____________________________________________   INITIAL IMPRESSION / ASSESSMENT AND PLAN / ED COURSE  As part of my medical decision making, I reviewed the following data within the [place] notes reviewed and incorporated, Labs reviewed, EKG interpreted, Old chart reviewed, Radiograph reviewed and Notes from prior ED visits     Mckinsey Keagle was evaluated in Emergency Department on 09/25/2019 for the symptoms described in the history of present illness. She was evaluated in the context of the global COVID-19 pandemic, which necessitated consideration that the patient might be at risk for infection with the SARS-CoV-2 virus that causes COVID-19. Institutional protocols and algorithms that pertain to the evaluation of patients at risk for COVID-19 are in a state of rapid change based on information released by regulatory bodies including the CDC and federal and state organizations. These policies and algorithms were followed during the patient's care in the ED.    85 year old female who presents with left  hip pain status post fall.  Differential diagnosis includes but is not limited to hip fracture, pelvic fracture, musculoskeletal injury, ACS, UTI.  Laboratory results unremarkable.  Will obtain CT head and left hip.  Obtain urinalysis.  Will reassess.   Clinical Course as of Sep 24 525  Sat Sep 25, 2019  0253 Patient sleeping in no acute distress.  Repeat troponin unremarkable.  CT head and left hip unremarkable.  Urine is negative for infection.  She ambulated without assistance to the commode.  Will discharge home with close follow-up with her PCP.  Strict return precautions given.  Patient verbalizes understanding and agrees with plan of care.   [JS]    Clinical Course User Index [JS] Paulette Blanch, MD     ____________________________________________   FINAL CLINICAL IMPRESSION(S) / ED DIAGNOSES  Final diagnoses:  Fall, initial encounter  Left hip pain     ED Discharge Orders    None       Note:  This document was prepared using Dragon voice recognition software and may include unintentional dictation errors.   Paulette Blanch, MD 09/25/19 [phone]

## 2019-09-25 DIAGNOSIS — M25552 Pain in left hip: Secondary | ICD-10-CM | POA: Diagnosis not present

## 2019-09-25 LAB — URINALYSIS, COMPLETE (UACMP) WITH MICROSCOPIC
Bacteria, UA: NONE SEEN
Bilirubin Urine: NEGATIVE
Glucose, UA: NEGATIVE mg/dL
Ketones, ur: NEGATIVE mg/dL
Leukocytes,Ua: NEGATIVE
Nitrite: NEGATIVE
Protein, ur: NEGATIVE mg/dL
Specific Gravity, Urine: 1.018 (ref 1.005–1.030)
pH: 5 (ref 5.0–8.0)

## 2019-09-25 LAB — TROPONIN I (HIGH SENSITIVITY): Troponin I (High Sensitivity): 9 ng/L (ref <18)

## 2019-09-25 NOTE — ED Notes (Signed)
Pt cleansed from incont of urine; bed sheets changed & new brief and chucks placed. In and out performed by Nira Conn, RN with this tech for assistance. Urine sample sent to lab.

## 2019-09-25 NOTE — Discharge Instructions (Addendum)
Your CT scan does not show bleeding on the brain, hip or pelvic fractures.  Urine does not show evidence for infection.  Return to the ER for worsening symptoms, persistent vomiting, difficulty breathing or other concerns.

## 2019-09-25 NOTE — ED Notes (Signed)
Assisted pt with bedpan again. Pt has removed iv and all monitoring equipment, attempting to get out of bed. Pt moved to hallway bed to keep in visualization. Attempt to call pt's daughter again x2 without answer.

## 2019-09-25 NOTE — ED Notes (Signed)
Ems here to pick pt up.

## 2019-09-25 NOTE — ED Notes (Signed)
Spoke with pt's daugher jenny Yankovich via telephone to review discharge instructions. Pt's daughter requesting pt to be sent home in ambulance.

## 2019-09-25 NOTE — ED Notes (Signed)
Attempt to call pt's daughter for ride. Pt's daughter does not answer phone, no voice mail available.

## 2019-10-07 ENCOUNTER — Other Ambulatory Visit: Payer: Self-pay

## 2019-10-07 ENCOUNTER — Encounter: Payer: Self-pay | Admitting: Nurse Practitioner

## 2019-10-07 ENCOUNTER — Other Ambulatory Visit: Payer: Medicare Other | Admitting: Nurse Practitioner

## 2019-10-07 DIAGNOSIS — M069 Rheumatoid arthritis, unspecified: Secondary | ICD-10-CM

## 2019-10-07 DIAGNOSIS — Z515 Encounter for palliative care: Secondary | ICD-10-CM

## 2019-10-07 NOTE — Progress Notes (Signed)
[facility] Consult Note Telephone: [phone]  Fax: [phone]  PATIENT NAME: Angela Leonard DOB: 1934-11-08 MRN: [id number]  PRIMARY CARE PROVIDER:   Derinda Late, MD  REFERRING PROVIDER:  Derinda Late, MD [phone] [facility] [place],  [place] [address]  RESPONSIBLE PARTY:Angela Leonard; Angela Leonard daughter  RECOMMENDATIONS and PLAN: 1.ACP;Made DNR, in Epic/Vynca; completed MOST form to include limited interventions, no feeding tubes, wishes are for IVF, antibiotics  2.Chronic pain monitor on pain scale, monitor efficacy vs adverse side effects. Currently takingtylenol as needed, methotrexate increased to continue to follow by Rheumatology  3.Palliative care encounter Palliative medicine team will continue to support patient, patient's family, and medical team. Visit consisted of counseling and education dealing with the complex and emotionally intense issues of symptom management and palliative care in the setting of serious and potentially life-threatening illness  I spent 60 minutes providing this consultation,  from 1:00pm to 2:00pm. More than 50% of the time in this consultation was spent coordinating communication.   HISTORY OF PRESENT ILLNESS:  Angela Leonard is a 85 y.o. year old female with multiple medical problems including Atrial fibrillation, chronic kidney disease, Rheumatoid arthritis, hypertension, hyperlipidemia, osteoporosis, chicken pox, bilateral cataracts, cholecystectomy 2007, back surgery 2006. Hospitalized 08/24/2019 to 08/27/2019 for fall with facial laceration s/p repair; UTI received antibiotics; dementia complicated by acute delirium, chronic atrial fib with exertional RVR, continued diltiazem. Follow up face-to-face palliative care visit with Angela Leonard and her daughter Angela Leonard. We talked about purpose of palliative care visit. We talked about how the  spell has been doing. Angela Leonard has been walking with the walker. Angela Leonard does require assistance to bathe, dress. Angela Leonard does walk a few feet and then becomes very tired and has to sit down. Angela Leonard does wish to sleep most of the time. We talked about Angela Leonard appetite which varies but she has had a significant weight loss of greater than 20 lb. Angela Leonard is able to answer questions with confusion that is worsening. We talked about medical goals of care including aggressive vs conservative versus comfort care. We talked about DNR has already in place. We talked about most format links. Most form completed for Limited treatment, IV fluids if indicated, antibiotic therapy if indicated oh, no feeding tube. Placed in vynca. We talked about hospice benefit under Medicare program. We talked about services that they provide. Discuss with Angela Leonard option of reviewing with hospice positions for eligibility due to significant weight loss. Angela Leonard in agreement. We talked about role of palliative care and plan of care. We talked about their two dogs at length. We talked about things that Angela Leonard likes to do, sleep. We talked about once Hospice Physicians determine eligibility will contact Angela Leonard for update. Discussed if eligible will proceed to obtain hospice order and if found not eligible will schedule follow-up palliative care visit. Angela Leonard in agreement. Therapeutic listening and emotional support provided. Contact information. Questions answer to satisfaction. Palliative Care was asked to help to continue to address goals of care.   CODE STATUS: DNR  PPS: 40% HOSPICE ELIGIBILITY/DIAGNOSIS: TBD  PAST MEDICAL HISTORY:  Past Medical History:  Diagnosis Date  . HLD (hyperlipidemia)   . Hypertension     SOCIAL HX:  Social History   Tobacco Use  . Smoking status: Former Research scientist (life sciences)  . Smokeless tobacco: Never Used  Substance Use Topics  . Alcohol use: No    ALLERGIES:  No Known Allergies   PERTINENT  MEDICATIONS:  Outpatient Encounter Medications as of 10/07/2019  Medication Sig  . acetaminophen (TYLENOL) 325 MG tablet Take 650 mg by mouth every 12 (twelve) hours as needed.  . cholecalciferol (VITAMIN D) 1000 units tablet Take 1,000 Units by mouth daily.  Marland Kitchen diltiazem (TIAZAC) 180 MG 24 hr capsule Take 1 capsule (180 mg total) by mouth daily.  Marland Kitchen ELIQUIS 5 MG TABS tablet Take 5 mg by mouth every 12 (twelve) hours.  . folic acid (FOLVITE) 1 MG tablet Take 1 mg by mouth daily.  . methotrexate (RHEUMATREX) 2.5 MG tablet Take 7 tablets by mouth every Tuesday.   . Multiple Vitamin (MULTIVITAMIN WITH MINERALS) TABS tablet Take 1 tablet by mouth daily.  . multivitamin-lutein (OCUVITE-LUTEIN) CAPS capsule Take 1 capsule by mouth daily.  . ramipril (ALTACE) 10 MG capsule Take 10 mg by mouth daily.  . simvastatin (ZOCOR) 20 MG tablet Take 20 mg by mouth daily.   . vitamin B-12 (CYANOCOBALAMIN) 1000 MCG tablet Take 1,000 mcg by mouth daily.   No facility-administered encounter medications on file as of 10/07/2019.    PHYSICAL EXAM:   General: NAD, frail appearing, thin confused female Cardiovascular: regular rate and rhythm Pulmonary: clear ant fields Neurological: Walks with walker  [place] Sloop Ihor Gully, NP

## 2019-10-08 ENCOUNTER — Telehealth: Payer: Self-pay | Admitting: Nurse Practitioner

## 2019-10-08 NOTE — Telephone Encounter (Signed)
I called Dr Baldemar Lenis office for hospice order as Dr [facility] MD in agreement to Ms. Schrier being hospice eligible. I received order from Dr Baldemar Lenis, I called [name], Ms. Creed daughter for update. [name] in agreement to proceed with hospice order. I notified hospice referral center.

## 2020-08-11 ENCOUNTER — Other Ambulatory Visit: Payer: Self-pay

## 2020-08-11 ENCOUNTER — Emergency Department: Payer: Medicare Other

## 2020-08-11 ENCOUNTER — Encounter: Payer: Self-pay | Admitting: Emergency Medicine

## 2020-08-11 ENCOUNTER — Inpatient Hospital Stay
Admission: EM | Admit: 2020-08-11 | Discharge: 2020-08-15 | DRG: 481 | Disposition: A | Discharge status: Skilled Nursing Facility | Payer: Medicare Other | Attending: Internal Medicine | Admitting: Internal Medicine

## 2020-08-11 DIAGNOSIS — I4821 Permanent atrial fibrillation: Secondary | ICD-10-CM | POA: Diagnosis present

## 2020-08-11 DIAGNOSIS — M810 Age-related osteoporosis without current pathological fracture: Secondary | ICD-10-CM | POA: Diagnosis present

## 2020-08-11 DIAGNOSIS — Y92009 Unspecified place in unspecified non-institutional (private) residence as the place of occurrence of the external cause: Secondary | ICD-10-CM

## 2020-08-11 DIAGNOSIS — I482 Chronic atrial fibrillation, unspecified: Secondary | ICD-10-CM | POA: Diagnosis not present

## 2020-08-11 DIAGNOSIS — C44319 Basal cell carcinoma of skin of other parts of face: Secondary | ICD-10-CM | POA: Diagnosis present

## 2020-08-11 DIAGNOSIS — S72002S Fracture of unspecified part of neck of left femur, sequela: Secondary | ICD-10-CM

## 2020-08-11 DIAGNOSIS — S72002A Fracture of unspecified part of neck of left femur, initial encounter for closed fracture: Secondary | ICD-10-CM

## 2020-08-11 DIAGNOSIS — W1830XA Fall on same level, unspecified, initial encounter: Secondary | ICD-10-CM | POA: Diagnosis present

## 2020-08-11 DIAGNOSIS — Z820 Family history of epilepsy and other diseases of the nervous system: Secondary | ICD-10-CM | POA: Diagnosis not present

## 2020-08-11 DIAGNOSIS — M069 Rheumatoid arthritis, unspecified: Secondary | ICD-10-CM | POA: Diagnosis present

## 2020-08-11 DIAGNOSIS — R220 Localized swelling, mass and lump, head: Secondary | ICD-10-CM

## 2020-08-11 DIAGNOSIS — Z7901 Long term (current) use of anticoagulants: Secondary | ICD-10-CM

## 2020-08-11 DIAGNOSIS — Z87891 Personal history of nicotine dependence: Secondary | ICD-10-CM

## 2020-08-11 DIAGNOSIS — I10 Essential (primary) hypertension: Secondary | ICD-10-CM | POA: Diagnosis present

## 2020-08-11 DIAGNOSIS — R7301 Impaired fasting glucose: Secondary | ICD-10-CM | POA: Diagnosis present

## 2020-08-11 DIAGNOSIS — Z419 Encounter for procedure for purposes other than remedying health state, unspecified: Secondary | ICD-10-CM

## 2020-08-11 DIAGNOSIS — Z7952 Long term (current) use of systemic steroids: Secondary | ICD-10-CM | POA: Diagnosis not present

## 2020-08-11 DIAGNOSIS — F0390 Unspecified dementia without behavioral disturbance: Secondary | ICD-10-CM | POA: Diagnosis present

## 2020-08-11 DIAGNOSIS — I4891 Unspecified atrial fibrillation: Secondary | ICD-10-CM

## 2020-08-11 DIAGNOSIS — Z8249 Family history of ischemic heart disease and other diseases of the circulatory system: Secondary | ICD-10-CM

## 2020-08-11 DIAGNOSIS — Z79899 Other long term (current) drug therapy: Secondary | ICD-10-CM | POA: Diagnosis not present

## 2020-08-11 DIAGNOSIS — D7589 Other specified diseases of blood and blood-forming organs: Secondary | ICD-10-CM | POA: Diagnosis present

## 2020-08-11 DIAGNOSIS — Z20822 Contact with and (suspected) exposure to covid-19: Secondary | ICD-10-CM | POA: Diagnosis present

## 2020-08-11 DIAGNOSIS — S72012A Unspecified intracapsular fracture of left femur, initial encounter for closed fracture: Secondary | ICD-10-CM | POA: Diagnosis not present

## 2020-08-11 DIAGNOSIS — I959 Hypotension, unspecified: Secondary | ICD-10-CM

## 2020-08-11 DIAGNOSIS — E785 Hyperlipidemia, unspecified: Secondary | ICD-10-CM | POA: Diagnosis present

## 2020-08-11 LAB — TYPE AND SCREEN
ABO/RH(D): O POS
Antibody Screen: NEGATIVE

## 2020-08-11 LAB — BASIC METABOLIC PANEL
Anion gap: 9 (ref 5–15)
BUN: 38 mg/dL — ABNORMAL HIGH (ref 8–23)
CO2: 25 mmol/L (ref 22–32)
Calcium: 9.6 mg/dL (ref 8.9–10.3)
Chloride: 102 mmol/L (ref 98–111)
Creatinine, Ser: 0.94 mg/dL (ref 0.44–1.00)
GFR, Estimated: >60 mL/min (ref >60)
Glucose, Bld: 103 mg/dL — ABNORMAL HIGH (ref 70–99)
Potassium: 4.2 mmol/L (ref 3.5–5.1)
Sodium: 136 mmol/L (ref 135–145)

## 2020-08-11 LAB — CBC WITH DIFFERENTIAL/PLATELET
Abs Immature Granulocytes: 0.09 10*3/uL — ABNORMAL HIGH (ref 0.00–0.07)
Basophils Absolute: 0 10*3/uL (ref 0.0–0.1)
Basophils Relative: 0 %
Eosinophils Absolute: 0.1 10*3/uL (ref 0.0–0.5)
Eosinophils Relative: 1 %
HCT: 48.3 % — ABNORMAL HIGH (ref 36.0–46.0)
Hemoglobin: 16.2 g/dL — ABNORMAL HIGH (ref 12.0–15.0)
Immature Granulocytes: 1 %
Lymphocytes Relative: 9 %
Lymphs Abs: 1.2 10*3/uL (ref 0.7–4.0)
MCH: 34.8 pg — ABNORMAL HIGH (ref 26.0–34.0)
MCHC: 33.5 g/dL (ref 30.0–36.0)
MCV: 103.6 fL — ABNORMAL HIGH (ref 80.0–100.0)
Monocytes Absolute: 0.5 10*3/uL (ref 0.1–1.0)
Monocytes Relative: 4 %
Neutro Abs: 11.3 10*3/uL — ABNORMAL HIGH (ref 1.7–7.7)
Neutrophils Relative %: 85 %
Platelets: 212 10*3/uL (ref 150–400)
RBC: 4.66 MIL/uL (ref 3.87–5.11)
RDW: 14.4 % (ref 11.5–15.5)
WBC: 13.1 10*3/uL — ABNORMAL HIGH (ref 4.0–10.5)
nRBC: 0 % (ref 0.0–0.2)

## 2020-08-11 LAB — PROTIME-INR
INR: 1.3 — ABNORMAL HIGH (ref 0.8–1.2)
Prothrombin Time: 15.7 seconds — ABNORMAL HIGH (ref 11.4–15.2)

## 2020-08-11 LAB — RESP PANEL BY RT-PCR (FLU A&B, COVID) ARPGX2
Influenza A by PCR: NEGATIVE
Influenza B by PCR: NEGATIVE
SARS Coronavirus 2 by RT PCR: NEGATIVE

## 2020-08-11 MED ORDER — MORPHINE SULFATE (PF) 2 MG/ML IV SOLN: 1.0000 mg | INTRAVENOUS | Status: DC | PRN

## 2020-08-11 MED ORDER — SODIUM CHLORIDE 0.9 % IV SOLN: INTRAVENOUS | Status: DC

## 2020-08-11 MED ORDER — SENNOSIDES-DOCUSATE SODIUM 8.6-50 MG PO TABS
1.0000 | ORAL_TABLET | Freq: Every day | ORAL | Status: DC
  Administered 2020-08-11: 1 via ORAL
  Filled 2020-08-11: qty 1

## 2020-08-11 MED ORDER — METOPROLOL TARTRATE 5 MG/5ML IV SOLN: 2.5000 mg | INTRAVENOUS | Status: DC | PRN

## 2020-08-11 MED ORDER — MORPHINE SULFATE (PF) 2 MG/ML IV SOLN
2.0000 mg | Freq: Once | INTRAVENOUS | Status: AC
  Administered 2020-08-11: 2 mg via INTRAVENOUS
  Filled 2020-08-11: qty 1

## 2020-08-11 MED ORDER — DILTIAZEM HCL ER COATED BEADS 180 MG PO CP24
180.0000 mg | ORAL_CAPSULE | Freq: Every day | ORAL | Status: DC
  Administered 2020-08-11 – 2020-08-12 (×2): 180 mg via ORAL
  Filled 2020-08-11 (×4): qty 1

## 2020-08-11 MED ORDER — DILTIAZEM HCL ER BEADS 180 MG PO CP24: 180.0000 mg | ORAL_CAPSULE | Freq: Every day | ORAL | Status: DC

## 2020-08-11 MED ORDER — RAMIPRIL 10 MG PO CAPS
10.0000 mg | ORAL_CAPSULE | Freq: Every day | ORAL | Status: DC
  Administered 2020-08-11: 10 mg via ORAL
  Filled 2020-08-11 (×3): qty 1

## 2020-08-11 MED ORDER — FOLIC ACID 1 MG PO TABS
1.0000 mg | ORAL_TABLET | Freq: Every day | ORAL | Status: DC
  Administered 2020-08-13 – 2020-08-15 (×3): 1 mg via ORAL
  Filled 2020-08-11 (×3): qty 1

## 2020-08-11 MED ORDER — VITAMIN D 25 MCG (1000 UNIT) PO TABS
1000.0000 [IU] | ORAL_TABLET | Freq: Every day | ORAL | Status: DC
  Administered 2020-08-13: 1000 [IU] via ORAL
  Filled 2020-08-11: qty 1

## 2020-08-11 MED ORDER — CHLORHEXIDINE GLUCONATE 4 % EX LIQD
1.0000 "application " | Freq: Once | CUTANEOUS | Status: AC
  Administered 2020-08-12: 1 via TOPICAL

## 2020-08-11 MED ORDER — ONDANSETRON HCL 4 MG/2ML IJ SOLN
4.0000 mg | Freq: Once | INTRAMUSCULAR | Status: AC
  Administered 2020-08-11: 4 mg via INTRAVENOUS
  Filled 2020-08-11: qty 2

## 2020-08-11 MED ORDER — MORPHINE SULFATE (PF) 4 MG/ML IV SOLN
4.0000 mg | Freq: Once | INTRAVENOUS | Status: DC
Start: 2020-08-11 — End: 2020-08-12
  Filled 2020-08-11: qty 1

## 2020-08-11 MED ORDER — ACETAMINOPHEN 325 MG PO TABS: 650.0000 mg | ORAL_TABLET | Freq: Four times a day (QID) | ORAL | Status: DC | PRN

## 2020-08-11 MED ORDER — OXYCODONE HCL 5 MG PO TABS
5.0000 mg | ORAL_TABLET | ORAL | Status: DC | PRN
  Administered 2020-08-11: 5 mg via ORAL
  Filled 2020-08-11: qty 1

## 2020-08-11 MED ORDER — CEFAZOLIN SODIUM-DEXTROSE 2-4 GM/100ML-% IV SOLN
2.0000 g | INTRAVENOUS | Status: DC
  Filled 2020-08-11: qty 100

## 2020-08-11 MED ORDER — HYDROCODONE-ACETAMINOPHEN 5-325 MG PO TABS
1.0000 | ORAL_TABLET | ORAL | Status: DC | PRN
  Administered 2020-08-11: 1 via ORAL
  Filled 2020-08-11: qty 1

## 2020-08-11 MED ORDER — CLINDAMYCIN PHOSPHATE 600 MG/50ML IV SOLN
600.0000 mg | INTRAVENOUS | Status: AC
  Administered 2020-08-12: 600 mg via INTRAVENOUS
  Filled 2020-08-11 (×2): qty 50

## 2020-08-11 MED ORDER — VITAMIN B-12 1000 MCG PO TABS
1000.0000 ug | ORAL_TABLET | Freq: Every day | ORAL | Status: DC
  Administered 2020-08-13 – 2020-08-15 (×3): 1000 ug via ORAL
  Filled 2020-08-11 (×3): qty 1

## 2020-08-11 MED ORDER — ONDANSETRON HCL 4 MG/2ML IJ SOLN: 4.0000 mg | Freq: Four times a day (QID) | INTRAMUSCULAR | Status: DC | PRN

## 2020-08-11 MED ORDER — SIMVASTATIN 20 MG PO TABS
20.0000 mg | ORAL_TABLET | Freq: Every day | ORAL | Status: DC
  Administered 2020-08-11 – 2020-08-14 (×4): 20 mg via ORAL
  Filled 2020-08-11 (×5): qty 1

## 2020-08-11 NOTE — ED Notes (Signed)
Daughter at bedside.

## 2020-08-11 NOTE — Anesthesia Preprocedure Evaluation (Addendum)
Anesthesia Evaluation  Patient identified by MRN, date of birth, ID band Patient awake and Patient confused  General Assessment Comment:AOx2. Knows her name and DOB and that she's in a hospital, but thinks its 39.  Patient's mouth and tongue are stained blue with some residue. Patient said she thinks she had blueberries 10 minutes prior. Nurse from floor denies knowing any such furtive consumption.   Reviewed: Allergy & Precautions, NPO status , Patient's Chart, lab work & pertinent test results  History of Anesthesia Complications Negative for: history of anesthetic complications  Airway Mallampati: II  TM Distance: >3 FB Neck ROM: Limited    Dental  (+) Poor Dentition   Pulmonary neg pulmonary ROS, neg sleep apnea, neg COPD, Patient abstained from smoking.Not current smoker, former smoker,    Pulmonary exam normal breath sounds clear to auscultation       Cardiovascular Exercise Tolerance: Poor METShypertension, (-) CAD and (-) Past MI + dysrhythmias Atrial Fibrillation  Rhythm:Irregular Rate:Normal - Systolic murmurs    Neuro/Psych PSYCHIATRIC DISORDERS Dementia negative neurological ROS     GI/Hepatic neg GERD  ,(+)     (-) substance abuse  ,   Endo/Other  neg diabetes  Renal/GU negative Renal ROS     Musculoskeletal  (+) Arthritis , Rheumatoid disorders,    Abdominal   Peds  Hematology   Anesthesia Other Findings Past Medical History: No date: HLD (hyperlipidemia) No date: Hypertension   Reproductive/Obstetrics                            Anesthesia Physical Anesthesia Plan  ASA: III  Anesthesia Plan: General   Post-op Pain Management:    Induction: Intravenous and Rapid sequence  PONV Risk Score and Plan: 3 and Ondansetron, Dexamethasone and Treatment may vary due to age or medical condition  Airway Management Planned: Oral ETT and Video Laryngoscope  Planned  Additional Equipment: None  Intra-op Plan:   Post-operative Plan: Extubation in OR  Informed Consent: I have reviewed the patients History and Physical, chart, labs and discussed the procedure including the risks, benefits and alternatives for the proposed anesthesia with the patient or authorized representative who has indicated his/her understanding and acceptance.   Patient has DNR.  Discussed DNR with power of attorney and Suspend DNR.   Dental advisory given and Consent reviewed with POA  Plan Discussed with: CRNA and Surgeon  Anesthesia Plan Comments: (Discussed risks of anesthesia with daughter Aadvika Konen on phone, including PONV, sore throat, lip/dental damage. Rare risks discussed as well, such as aspiration, cardiorespiratory and neurological sequelae. Sheunderstands.  Given no clear answer one way or another if patient ate, with no clear evidence confirming breaking NPO, will proceed and do RSI. Planned to do GETA anyway instead of spinal because patient took eliquis < 72hrs ago.)        Anesthesia Quick Evaluation

## 2020-08-11 NOTE — ED Provider Notes (Signed)
[facility] Emergency Department Provider Note  ____________________________________________  Time seen: Approximately 7:38 AM  I have reviewed the triage vital signs and the nursing notes.   HISTORY  Chief Complaint Fall and Hip Pain    HPI Angela Leonard is a 86 y.o. female with a history of hypertension, atrial fibrillation who was in her usual state of health, walking at home when she had a trip and fall.  Fell onto her left hip resulting in severe sudden left hip pain which is nonradiating, worse with movement, no alleviating factors.  No head injury or loss of consciousness.      Past Medical History:  Diagnosis Date  . HLD (hyperlipidemia)   . Hypertension      Patient Active Problem List   Diagnosis Date Noted  . Dehydration 08/25/2019  . UTI (urinary tract infection) 08/24/2019  . Fall at home, initial encounter 08/24/2019  . Hypokalemia 08/24/2019  . RA (rheumatoid arthritis) ([place]) 08/24/2019  . Fall 08/24/2019  . Facial laceration   . Atrial fibrillation, chronic ([place]) 02/19/2019  . Sepsis ([place]) 08/03/2018  . Atypical pneumonia 08/03/2018  . Arthritis of right hip 08/03/2018  . HTN (hypertension) 08/03/2018  . HLD (hyperlipidemia) 08/03/2018  . AKI (acute kidney injury) ([place]) 09/15/2017  . Symptomatic cholelithiasis 10/13/2014     Past Surgical History:  Procedure Laterality Date  . BACK SURGERY    . CHOLECYSTECTOMY N/A 10/14/2014   Procedure: LAPAROSCOPIC CHOLECYSTECTOMY;  Surgeon: Marlyce Huge, MD;  Location: [facility] ORS;  Service: General;  Laterality: N/A;  . COLON SURGERY       Prior to Admission medications   Medication Sig Start Date End Date Taking? Authorizing Provider  acetaminophen (TYLENOL) 325 MG tablet Take 650 mg by mouth every 12 (twelve) hours as needed.    [provider]  cholecalciferol (VITAMIN D) 1000 units tablet Take 1,000 Units by mouth daily.    [provider]  diltiazem  (TIAZAC) 180 MG 24 hr capsule Take 1 capsule (180 mg total) by mouth daily. 08/27/19   Patrecia Pour, MD  ELIQUIS 5 MG TABS tablet Take 5 mg by mouth every 12 (twelve) hours.    [provider]  folic acid (FOLVITE) 1 MG tablet Take 1 mg by mouth daily.    [provider]  methotrexate (RHEUMATREX) 2.5 MG tablet Take 7 tablets by mouth every Tuesday.     [provider]  Multiple Vitamin (MULTIVITAMIN WITH MINERALS) TABS tablet Take 1 tablet by mouth daily.    [provider]  multivitamin-lutein (OCUVITE-LUTEIN) CAPS capsule Take 1 capsule by mouth daily.    [provider]  ramipril (ALTACE) 10 MG capsule Take 10 mg by mouth daily.    [provider]  simvastatin (ZOCOR) 20 MG tablet Take 20 mg by mouth daily.     [provider]  vitamin B-12 (CYANOCOBALAMIN) 1000 MCG tablet Take 1,000 mcg by mouth daily.    [provider]     Allergies Patient has no known allergies.   Family History  Problem Relation Age of Onset  . Alzheimer's disease Mother   . Parkinson's disease Mother   . Cancer Father   . Heart disease Sister   . Parkinson's disease Brother   . Heart disease Brother     Social History Social History   Tobacco Use  . Smoking status: Former Research scientist (life sciences)  . Smokeless tobacco: Never Used  Substance Use Topics  . Alcohol use: No  . Drug  use: No    Review of Systems  Constitutional:   No fever or chills.  ENT:   No sore throat. No rhinorrhea. Cardiovascular:   No chest pain or syncope. Respiratory:   No dyspnea or cough. Gastrointestinal:   Negative for abdominal pain, vomiting and diarrhea.  Musculoskeletal:   Left hip pain as above All other systems reviewed and are negative except as documented above in ROS and HPI.  ____________________________________________   PHYSICAL EXAM:  VITAL SIGNS: ED Triage Vitals  Enc Vitals Group     BP 08/11/20 0721 (!) 151/86     Pulse Rate 08/11/20 0721 88      Resp 08/11/20 0721 16     Temp 08/11/20 0721 98 F (36.7 C)     Temp Source 08/11/20 0721 Oral     SpO2 08/11/20 0721 94 %     Weight 08/11/20 0723 165 lb (74.8 kg)     Height 08/11/20 0723 5\' 5"  (1.651 m)     Head Circumference --      Peak Flow --      Pain Score 08/11/20 0723 0     Pain Loc --      Pain Edu? --      Excl. in [place]? --     Vital signs reviewed, nursing assessments reviewed.   Constitutional:   Alert and oriented. Non-toxic appearance. Eyes:   Conjunctivae are normal. EOMI. PERRL. ENT      Head:   Normocephalic and atraumatic.      Nose:   Wearing a mask.      Mouth/Throat:   Wearing a mask.      Neck:   No meningismus. Full ROM. Hematological/Lymphatic/Immunilogical:   No cervical lymphadenopathy. Cardiovascular:   RRR. Symmetric bilateral radial and DP pulses.  No murmurs. Cap refill less than 2 seconds. Respiratory:   Normal respiratory effort without tachypnea/retractions. Breath sounds are clear and equal bilaterally. No wheezes/rales/rhonchi. Gastrointestinal:   Soft and nontender. Non distended. There is no CVA tenderness.  No rebound, rigidity, or guarding.  Musculoskeletal:   Pain with passive hip flexion on the left.  Tenderness with hip palpation on the left.  Rest of the long bones, knee and ankle are unremarkable.  Other extremities are unremarkable with normal range of motion. Neurologic:   Normal speech and language.  Motor grossly intact. No acute focal neurologic deficits are appreciated.  Skin:    Skin is warm, dry and intact. No rash noted.  No petechiae, purpura, or bullae.  ____________________________________________    LABS (pertinent positives/negatives) (all labs ordered are listed, but only abnormal results are displayed) Labs Reviewed  BASIC METABOLIC PANEL - Abnormal; Notable for the following components:      Result Value   Glucose, Bld 103 (*)    BUN 38 (*)    All other components within normal limits  CBC WITH  DIFFERENTIAL/PLATELET - Abnormal; Notable for the following components:   WBC 13.1 (*)    Hemoglobin 16.2 (*)    HCT 48.3 (*)    MCV 103.6 (*)    MCH 34.8 (*)    Neutro Abs 11.3 (*)    Abs Immature Granulocytes 0.09 (*)    All other components within normal limits  PROTIME-INR - Abnormal; Notable for the following components:   Prothrombin Time 15.7 (*)    INR 1.3 (*)    All other components within normal limits  RESP PANEL BY RT-PCR (FLU A&B, COVID) ARPGX2  URINALYSIS, ROUTINE W REFLEX  MICROSCOPIC  TYPE AND SCREEN   ____________________________________________   EKG  Interpreted by me Atrial fibrillation rate of 93.  Normal axis and intervals.  Poor R wave progression.  Normal ST segments and T waves.  ____________________________________________    [id number]  DG Chest 1 View  Result Date: 08/11/2020 CLINICAL DATA:  Left hip pain EXAM: CHEST  1 VIEW COMPARISON:  08/24/2019 FINDINGS: Chronic interstitial lung changes similar to the prior study. No definite new consolidation or edema. No pleural effusion. No pneumothorax. Stable cardiomediastinal contours. IMPRESSION: Chronic interstitial lung changes.  No definite acute abnormality. Electronically Signed   By: Macy Mis M.D.   On: 08/11/2020 07:53   CT Hip Left Wo Contrast  Result Date: 08/11/2020 CLINICAL DATA:  [place].  Hip pain. EXAM: CT OF THE LEFT HIP WITHOUT CONTRAST TECHNIQUE: Multidetector CT imaging of the left hip was performed according to the standard protocol. Multiplanar CT image reconstructions were also generated. COMPARISON:  Radiographs, same date and prior CT scan 09/25/2019 FINDINGS: There is a nondisplaced and very slightly impacted left femoral neck fracture. No other fractures are identified. The pubic symphysis and visualized bony pelvis is intact. There are mild left hip joint degenerative changes moderate to advanced right hip joint degenerative changes. Chondrocalcinosis noted involving both hips and  the pubic symphysis. The surrounding hip and pelvic musculature are grossly normal. No obvious muscle tear or large intramuscular hematoma. The patients endometrium is thickened for age measuring up to 12 mm. Findings are also seen on prior MRI of the pelvis from 02/19/2019, CT scan 09/25/2018 and MRI from 09/15/2017. Given its stability this is most likely due to cervical stenosis and not endometrial cancer. IMPRESSION: 1. Nondisplaced and very slightly impacted left femoral neck fracture. 2. Moderate to advanced right hip joint degenerative changes. 3. Chondrocalcinosis involving both hips and the pubic symphysis. 4. Thickened endometrium for age measuring up to 12 mm. Given its stability since prior imaging dating back to 2019, this is most likely due to cervical stenosis and not endometrial cancer. Electronically Signed   By: Marijo Sanes M.D.   On: 08/11/2020 09:16   DG Hip Unilat W or Wo Pelvis 2-3 Views Left  Result Date: 08/11/2020 CLINICAL DATA:  Left hip pain after fall. EXAM: DG HIP (WITH OR WITHOUT PELVIS) 2-3V LEFT COMPARISON:  CT left hip dated Sep 25, 2019. Left hip x-rays dated September 24, 2019. FINDINGS: Questionable cortical irregularity at the left superior femoral head neck junction. No dislocation. Unchanged mild to moderate right hip osteoarthritis. Osteopenia. Soft tissues are unremarkable. IMPRESSION: 1. Questionable cortical irregularity at the left superior femoral head neck junction may represent an underlying acute nondisplaced fracture. Recommend CT of the left hip for further evaluation. Electronically Signed   By: Titus Dubin M.D.   On: 08/11/2020 08:24    ____________________________________________   PROCEDURES Procedures  ____________________________________________    CLINICAL IMPRESSION / ASSESSMENT AND PLAN / ED COURSE  Medications ordered in the ED: Medications  0.9 %  sodium chloride infusion (has no administration in time range)  ceFAZolin (ANCEF) IVPB  2g/100 mL premix (has no administration in time range)  chlorhexidine (HIBICLENS) 4 % liquid 1 application (has no administration in time range)  clindamycin (CLEOCIN) IVPB 600 mg (has no administration in time range)  HYDROcodone-acetaminophen (NORCO/VICODIN) 5-325 MG per tablet 1 tablet (has no administration in time range)  morphine 2 MG/ML injection 1 mg (has no administration in time range)  morphine 4 MG/ML injection 4 mg (has no  administration in time range)  ondansetron (ZOFRAN) injection 4 mg (4 mg Intravenous Given 08/11/20 0803)  morphine 2 MG/ML injection 2 mg (2 mg Intravenous Given 08/11/20 0804)    Pertinent labs & imaging results that were available during my care of the patient were reviewed by me and considered in my medical decision making (see chart for details).  Avamae Dehaan was evaluated in Emergency Department on 08/11/2020 for the symptoms described in the history of present illness. She was evaluated in the context of the global COVID-19 pandemic, which necessitated consideration that the patient might be at risk for infection with the SARS-CoV-2 virus that causes COVID-19. Institutional protocols and algorithms that pertain to the evaluation of patients at risk for COVID-19 are in a state of rapid change based on information released by regulatory bodies including the CDC and federal and state organizations. These policies and algorithms were followed during the patient's care in the ED.   Patient presents with left hip pain after a fall, reports being unable to bear weight at home after the fall.  Exam concerning for hip fracture.  X-ray viewed and interpreted by me which does appear to show a slight left femoral neck fracture.  Radiology report agrees that it is a subtle finding and recommend CT scan of the left hip.  This was obtained and follow-up radiology report confirms presence of a fracture.  Discussed with Dr. Sabra Heck who will plan surgical stabilization tomorrow.   Discussed with hospitalist for further perioperative management.      ____________________________________________   FINAL CLINICAL IMPRESSION(S) / ED DIAGNOSES    Final diagnoses:  Closed fracture of neck of left femur, initial encounter ([place])  Chronic atrial fibrillation, unspecified [facility])     ED Discharge Orders    None      Portions of this note were generated with dragon dictation software. Dictation errors may occur despite best attempts at proofreading.   Carrie Mew, MD 08/11/20 [phone]

## 2020-08-11 NOTE — ED Triage Notes (Signed)
Pt here via EMS from home with c/o fall with left hip pain a few hours ago, unable to put pressure on it, states it hurts to move it, but denies pain when still. States "my leg just gave out on me." denies LOC, denies hitting her head, denies use of blood thinners. Alert and oriented, NAD.

## 2020-08-11 NOTE — ED Notes (Signed)
Placed bedpan under pt. Pt had stool on her. Pt urinated into bedpan. Cleaned up with warm wipes. Dry chuck and brief placed on pt.

## 2020-08-11 NOTE — ED Notes (Signed)
Patient is resting comfortably, bed locked and low, call light in reach.

## 2020-08-11 NOTE — Consult Note (Signed)
ORTHOPAEDIC CONSULTATION  REQUESTING PHYSICIAN: Carrie Mew, MD  Chief Complaint: Left hip pain  HPI: Angela Leonard is a 86 y.o. female who complains of left hip pain after a fall earlier this morning.  Patient lives at home with her daughter and has mild dementia.  She fell on the left hip and was unable to walk after that.  She was brought to the emergency room where exam and x-rays revealed a very minimal nondisplaced subcapital fracture of the left hip.  CT scan was needed to verify this.  I advised the patient and her daughter that these fractures sometimes displaced if not fixed early and treatment was much simpler early on then later.  Risk and benefits of surgery and postop protocol were discussed with them.  Their request we will schedule her for pinning of the left hip tomorrow.  She is on Eliquis and had her last dose yesterday.  She will need to go to skilled nursing probably.  Past Medical History:  Diagnosis Date  . HLD (hyperlipidemia)   . Hypertension    Past Surgical History:  Procedure Laterality Date  . BACK SURGERY    . CHOLECYSTECTOMY N/A 10/14/2014   Procedure: LAPAROSCOPIC CHOLECYSTECTOMY;  Surgeon: Marlyce Huge, MD;  Location: [facility] ORS;  Service: General;  Laterality: N/A;  . COLON SURGERY     Social History   Socioeconomic History  . Marital status: Widowed    Spouse name: Not on file  . Number of children: Not on file  . Years of education: Not on file  . Highest education level: Not on file  Occupational History  . Not on file  Tobacco Use  . Smoking status: Former Research scientist (life sciences)  . Smokeless tobacco: Never Used  Substance and Sexual Activity  . Alcohol use: No  . Drug use: No  . Sexual activity: Not on file  Other Topics Concern  . Not on file  Social History Narrative  . Not on file   Social Determinants of Health   Financial Resource Strain: Not on file  Food Insecurity: Not on file  Transportation Needs: Not on file  Physical  Activity: Not on file  Stress: Not on file  Social Connections: Not on file   Family History  Problem Relation Age of Onset  . Alzheimer's disease Mother   . Parkinson's disease Mother   . Cancer Father   . Heart disease Sister   . Parkinson's disease Brother   . Heart disease Brother    No Known Allergies Prior to Admission medications   Medication Sig Start Date End Date Taking? Authorizing Provider  acetaminophen (TYLENOL) 325 MG tablet Take 650 mg by mouth every 12 (twelve) hours as needed.    [provider]  cholecalciferol (VITAMIN D) 1000 units tablet Take 1,000 Units by mouth daily.    [provider]  diltiazem (TIAZAC) 180 MG 24 hr capsule Take 1 capsule (180 mg total) by mouth daily. 08/27/19   Patrecia Pour, MD  ELIQUIS 5 MG TABS tablet Take 5 mg by mouth every 12 (twelve) hours.    [provider]  folic acid (FOLVITE) 1 MG tablet Take 1 mg by mouth daily.    [provider]  methotrexate (RHEUMATREX) 2.5 MG tablet Take 7 tablets by mouth every Tuesday.     [provider]  Multiple Vitamin (MULTIVITAMIN WITH MINERALS) TABS tablet Take 1 tablet by mouth daily.    [provider]  multivitamin-lutein (OCUVITE-LUTEIN) CAPS capsule Take 1 capsule by  mouth daily.    [provider]  ramipril (ALTACE) 10 MG capsule Take 10 mg by mouth daily.    [provider]  simvastatin (ZOCOR) 20 MG tablet Take 20 mg by mouth daily.     [provider]  vitamin B-12 (CYANOCOBALAMIN) 1000 MCG tablet Take 1,000 mcg by mouth daily.    [provider]   DG Chest 1 View  Result Date: 08/11/2020 CLINICAL DATA:  Left hip pain EXAM: CHEST  1 VIEW COMPARISON:  08/24/2019 FINDINGS: Chronic interstitial lung changes similar to the prior study. No definite new consolidation or edema. No pleural effusion. No pneumothorax. Stable cardiomediastinal contours. IMPRESSION: Chronic interstitial lung changes.  No definite  acute abnormality. Electronically Signed   By: Macy Mis M.D.   On: 08/11/2020 07:53   CT Hip Left Wo Contrast  Result Date: 08/11/2020 CLINICAL DATA:  [place].  Hip pain. EXAM: CT OF THE LEFT HIP WITHOUT CONTRAST TECHNIQUE: Multidetector CT imaging of the left hip was performed according to the standard protocol. Multiplanar CT image reconstructions were also generated. COMPARISON:  Radiographs, same date and prior CT scan 09/25/2019 FINDINGS: There is a nondisplaced and very slightly impacted left femoral neck fracture. No other fractures are identified. The pubic symphysis and visualized bony pelvis is intact. There are mild left hip joint degenerative changes moderate to advanced right hip joint degenerative changes. Chondrocalcinosis noted involving both hips and the pubic symphysis. The surrounding hip and pelvic musculature are grossly normal. No obvious muscle tear or large intramuscular hematoma. The patients endometrium is thickened for age measuring up to 12 mm. Findings are also seen on prior MRI of the pelvis from 02/19/2019, CT scan 09/25/2018 and MRI from 09/15/2017. Given its stability this is most likely due to cervical stenosis and not endometrial cancer. IMPRESSION: 1. Nondisplaced and very slightly impacted left femoral neck fracture. 2. Moderate to advanced right hip joint degenerative changes. 3. Chondrocalcinosis involving both hips and the pubic symphysis. 4. Thickened endometrium for age measuring up to 12 mm. Given its stability since prior imaging dating back to 2019, this is most likely due to cervical stenosis and not endometrial cancer. Electronically Signed   By: Marijo Sanes M.D.   On: 08/11/2020 09:16   DG Hip Unilat W or Wo Pelvis 2-3 Views Left  Result Date: 08/11/2020 CLINICAL DATA:  Left hip pain after fall. EXAM: DG HIP (WITH OR WITHOUT PELVIS) 2-3V LEFT COMPARISON:  CT left hip dated Sep 25, 2019. Left hip x-rays dated September 24, 2019. FINDINGS: Questionable cortical  irregularity at the left superior femoral head neck junction. No dislocation. Unchanged mild to moderate right hip osteoarthritis. Osteopenia. Soft tissues are unremarkable. IMPRESSION: 1. Questionable cortical irregularity at the left superior femoral head neck junction may represent an underlying acute nondisplaced fracture. Recommend CT of the left hip for further evaluation. Electronically Signed   By: Titus Dubin M.D.   On: 08/11/2020 08:24    Positive ROS: All other systems have been reviewed and were otherwise negative with the exception of those mentioned in the HPI and as above.  Physical Exam: General: Alert, no acute distress Cardiovascular: No pedal edema Respiratory: No cyanosis, no use of accessory musculature GI: No organomegaly, abdomen is soft and non-tender Skin: No lesions in the area of chief complaint Neurologic: Sensation intact distally Psychiatric: Patient is competent for consent with normal mood and affect Lymphatic: No axillary or cervical lymphadenopathy  MUSCULOSKELETAL: Patient is alert and minimally confused.  Her daughter  is at the bedside.  Left hip has significant pain with range of motion.  No swelling or bruising.  Neurovascular status is good.  There is no shortening.  The right leg is full motion.  The upper extremities are normal as well.  Assessment: Nondisplaced subcapital fracture left hip  Plan: Percutaneous left hip pinning tomorrow.    Park Breed, MD [phone]   08/11/2020 1:53 PM

## 2020-08-11 NOTE — H&P (Signed)
History and Physical  Angela Leonard XBD:[id number] DOB: 09-11-1934 DOA: 08/11/2020  Referring physician: Dr. Joni Fears, [place]  PCP: Derinda Late, MD  Outpatient Specialists: Cardiology. Patient coming from: Home.  Chief Complaint: Mechanical fall at home.  HPI: Angela Leonard is a 86 y.o. female with medical history significant for dementia, permanent A. fib on Eliquis, essential hypertension, hyperlipidemia, rheumatoid arthritis on methotrexate and folic acid, who presented to [facility] ED after a mechanical fall at home early this AM around 5AM as she got up to use the comode.  Per the patient and her daughter she either tripped on her bedside commode or got her feet tangled on her bedsheet.  Patient was in her usual state of health prior to this.  She denies any dizziness or cardiopulmonary symptoms prior to this.  She denies any loss of consciousness or hitting her head.  She presented to the ED due to severe left hip pain for further evaluation and management.  X-ray of left hip in the ED revealed questionable cortical irregularity of the left superior femoral head neck junction which may represent an underlying acute nondisplaced fracture.  Recommend a CT of the left knee for further evaluation which was completed.  Last dose of Eliquis was taken on 08/10/2020 at 9 PM.  Orthopedic surgery, Dr. Sabra Heck, was consulted by EDP.  Recommended to continue to hold off on Eliquis with plan for surgical intervention tomorrow 08/12/2020.  N.p.o. after midnight.  ED Course:  Afebrile, nonseptic appearing, BP 143/129, pulse 115, respiratory 15, O2 saturation 93% on room air.  Lab studies remarkable for WBC 13.1K, hemoglobin 16.2K.  Review of Systems: Review of systems as noted in the HPI. All other systems reviewed and are negative.   Past Medical History:  Diagnosis Date  . HLD (hyperlipidemia)   . Hypertension    Past Surgical History:  Procedure Laterality Date  . BACK SURGERY    . CHOLECYSTECTOMY N/A  10/14/2014   Procedure: LAPAROSCOPIC CHOLECYSTECTOMY;  Surgeon: Marlyce Huge, MD;  Location: [facility] ORS;  Service: General;  Laterality: N/A;  . COLON SURGERY      Social History:  reports that she has quit smoking. She has never used smokeless tobacco. She reports that she does not drink alcohol and does not use drugs.   No Known Allergies  Family History  Problem Relation Age of Onset  . Alzheimer's disease Mother   . Parkinson's disease Mother   . Cancer Father   . Heart disease Sister   . Parkinson's disease Brother   . Heart disease Brother       Prior to Admission medications   Medication Sig Start Date End Date Taking? Authorizing Provider  acetaminophen (TYLENOL) 325 MG tablet Take 650 mg by mouth every 12 (twelve) hours as needed.    [provider]  cholecalciferol (VITAMIN D) 1000 units tablet Take 1,000 Units by mouth daily.    [provider]  diltiazem (TIAZAC) 180 MG 24 hr capsule Take 1 capsule (180 mg total) by mouth daily. 08/27/19   Patrecia Pour, MD  ELIQUIS 5 MG TABS tablet Take 5 mg by mouth every 12 (twelve) hours.    [provider]  folic acid (FOLVITE) 1 MG tablet Take 1 mg by mouth daily.    [provider]  methotrexate (RHEUMATREX) 2.5 MG tablet Take 7 tablets by mouth every Tuesday.     [provider]  Multiple Vitamin (MULTIVITAMIN WITH MINERALS) TABS tablet Take 1 tablet by mouth daily.  [provider]  multivitamin-lutein (OCUVITE-LUTEIN) CAPS capsule Take 1 capsule by mouth daily.    [provider]  ramipril (ALTACE) 10 MG capsule Take 10 mg by mouth daily.    [provider]  simvastatin (ZOCOR) 20 MG tablet Take 20 mg by mouth daily.     [provider]  vitamin B-12 (CYANOCOBALAMIN) 1000 MCG tablet Take 1,000 mcg by mouth daily.    [provider]    Physical Exam: BP (!) 125/92   Pulse (!) 122   Temp 98 F (36.7 C) (Oral)   Resp 20   Ht 5'  5" (1.651 m)   Wt 74.8 kg   SpO2 94%   BMI 27.46 kg/m   . General: 86 y.o. year-old female well developed well nourished in no acute distress.  Alert and confused in the setting of dementia. . Cardiovascular: Irregular rate and rhythm with no rubs or gallops.  No thyromegaly or JVD noted.  No lower extremity edema. 2/4 pulses in all 4 extremities. Marland Kitchen Respiratory: Clear to auscultation with no wheezes or rales. Good inspiratory effort. . Abdomen: Soft nontender nondistended with normal bowel sounds x4 quadrants. . Muskuloskeletal: No cyanosis, clubbing or edema noted bilaterally . Neuro: CN II-XII intact, strength, sensation, reflexes . Skin: No ulcerative lesions noted or rashes . Psychiatry: Judgement and insight appear altered in a state of dementia. Mood is appropriate for condition and setting          Labs on Admission:  Basic Metabolic Panel: Recent Labs  Lab 08/11/20 0806  NA 136  K 4.2  CL 102  CO2 25  GLUCOSE 103*  BUN 38*  CREATININE 0.94  CALCIUM 9.6   Liver Function Tests: No results for input(s): AST, ALT, ALKPHOS, BILITOT, PROT, ALBUMIN in the last 168 hours. No results for input(s): LIPASE, AMYLASE in the last 168 hours. No results for input(s): AMMONIA in the last 168 hours. CBC: Recent Labs  Lab 08/11/20 0806  WBC 13.1*  NEUTROABS 11.3*  HGB 16.2*  HCT 48.3*  MCV 103.6*  PLT 212   Cardiac Enzymes: No results for input(s): CKTOTAL, CKMB, CKMBINDEX, TROPONINI in the last 168 hours.  BNP (last 3 results) No results for input(s): BNP in the last 8760 hours.  ProBNP (last 3 results) No results for input(s): PROBNP in the last 8760 hours.  CBG: No results for input(s): GLUCAP in the last 168 hours.  Radiological Exams on Admission: DG Chest 1 View  Result Date: 08/11/2020 CLINICAL DATA:  Left hip pain EXAM: CHEST  1 VIEW COMPARISON:  08/24/2019 FINDINGS: Chronic interstitial lung changes similar to the prior study. No definite new consolidation  or edema. No pleural effusion. No pneumothorax. Stable cardiomediastinal contours. IMPRESSION: Chronic interstitial lung changes.  No definite acute abnormality. Electronically Signed   By: Macy Mis M.D.   On: 08/11/2020 07:53   CT Hip Left Wo Contrast  Result Date: 08/11/2020 CLINICAL DATA:  [place].  Hip pain. EXAM: CT OF THE LEFT HIP WITHOUT CONTRAST TECHNIQUE: Multidetector CT imaging of the left hip was performed according to the standard protocol. Multiplanar CT image reconstructions were also generated. COMPARISON:  Radiographs, same date and prior CT scan 09/25/2019 FINDINGS: There is a nondisplaced and very slightly impacted left femoral neck fracture. No other fractures are identified. The pubic symphysis and visualized bony pelvis is intact. There are mild left hip joint degenerative changes moderate to advanced right hip joint degenerative changes. Chondrocalcinosis noted involving both hips and the pubic  symphysis. The surrounding hip and pelvic musculature are grossly normal. No obvious muscle tear or large intramuscular hematoma. The patients endometrium is thickened for age measuring up to 12 mm. Findings are also seen on prior MRI of the pelvis from 02/19/2019, CT scan 09/25/2018 and MRI from 09/15/2017. Given its stability this is most likely due to cervical stenosis and not endometrial cancer. IMPRESSION: 1. Nondisplaced and very slightly impacted left femoral neck fracture. 2. Moderate to advanced right hip joint degenerative changes. 3. Chondrocalcinosis involving both hips and the pubic symphysis. 4. Thickened endometrium for age measuring up to 12 mm. Given its stability since prior imaging dating back to 2019, this is most likely due to cervical stenosis and not endometrial cancer. Electronically Signed   By: Marijo Sanes M.D.   On: 08/11/2020 09:16   DG Hip Unilat W or Wo Pelvis 2-3 Views Left  Result Date: 08/11/2020 CLINICAL DATA:  Left hip pain after fall. EXAM: DG HIP (WITH  OR WITHOUT PELVIS) 2-3V LEFT COMPARISON:  CT left hip dated Sep 25, 2019. Left hip x-rays dated September 24, 2019. FINDINGS: Questionable cortical irregularity at the left superior femoral head neck junction. No dislocation. Unchanged mild to moderate right hip osteoarthritis. Osteopenia. Soft tissues are unremarkable. IMPRESSION: 1. Questionable cortical irregularity at the left superior femoral head neck junction may represent an underlying acute nondisplaced fracture. Recommend CT of the left hip for further evaluation. Electronically Signed   By: Titus Dubin M.D.   On: 08/11/2020 08:24    EKG: I independently viewed the EKG done and my findings are as followed: Atrial fibrillation with a rate of 93.  Nonspecific ST-T changes.  QTc 467.  Assessment/Plan Present on Admission: . Closed left hip fracture, initial encounter [facility])  Active Problems:   Closed left hip fracture, initial encounter ([place])  Nondisplaced and very slightly impacted left femoral neck fracture, post mechanical fall at home. Presented after mechanical fall, she tripped on either her bedside commode or got her feet tangled on bedsheet. CT left hip without contrast showed non displaced and very slightly impacted left femoral neck fracture. Last dose of Eliquis was taken on 08/10/2020 at 9 PM.   Orthopedic surgery, Dr. Sabra Heck, consulted by EDP.  Recommended to continue to hold off on Eliquis with plan for surgical intervention tomorrow 08/12/2020. Pain control Post surgery, PT OT with orthopedic surgery's guidance. TOC for placement and assistance with DME's.  Permanent A. Fib on Eliquis Continue to hold off Eliquis due to anticipation for surgery on 08/12/20 Rate controlled on p.o. diltiazem, continue Continue to monitor on telemetry.  Essential hypertension BP is not at goal Resume home diltiazem. IV antihypertensives as needed with parameters. Continue to monitor vital signs.  Leukocytosis, likely reactive in the  setting of acute fracture  Presented with WBC 13.1K Afebrile nonseptic appearing. No active disease on chest x-ray Obtain UA  Hyperlipidemia Resume home Zocor  Rheumatoid arthritis Hold off home methotrexate Continue folic acid.    DVT prophylaxis: SCDs.  Code Status: No CPR, okay with intubation, per her daughter who is also her medical power of attorney, at bedside.  Family Communication: Daughter at her bedside.  Disposition Plan: Admit to MedSurg with remote telemetry.  Consults called: Orthopedic surgery.  Admission status: Inpatient status.  Patient will require at least 2 midnights for further evaluation and treatment of present condition.   Status is: Inpatient    Dispo:  Patient From: Home  Planned Disposition: Home with Health Care Svc  Anticipated  date of discharge: 08/14/2020.  Medically stable for discharge: No, ongoing management of acute left hip fracture.         Kayleen Memos MD [facility] Pager [phone]  If 7PM-7AM, please contact night-coverage [email or web address] Password [facility]  08/11/2020, 3:11 PM

## 2020-08-12 ENCOUNTER — Inpatient Hospital Stay: Payer: Medicare Other

## 2020-08-12 ENCOUNTER — Encounter
Admission: EM | Disposition: A | Discharge status: Skilled Nursing Facility | Payer: Self-pay | Source: Home / Self Care | Attending: Internal Medicine

## 2020-08-12 ENCOUNTER — Inpatient Hospital Stay: Payer: Medicare Other | Admitting: Anesthesiology

## 2020-08-12 DIAGNOSIS — F0390 Unspecified dementia without behavioral disturbance: Secondary | ICD-10-CM

## 2020-08-12 DIAGNOSIS — I4891 Unspecified atrial fibrillation: Secondary | ICD-10-CM

## 2020-08-12 DIAGNOSIS — S72002S Fracture of unspecified part of neck of left femur, sequela: Secondary | ICD-10-CM

## 2020-08-12 DIAGNOSIS — E785 Hyperlipidemia, unspecified: Secondary | ICD-10-CM

## 2020-08-12 DIAGNOSIS — I959 Hypotension, unspecified: Secondary | ICD-10-CM

## 2020-08-12 DIAGNOSIS — R220 Localized swelling, mass and lump, head: Secondary | ICD-10-CM

## 2020-08-12 HISTORY — PX: HIP PINNING,CANNULATED: SHX1758

## 2020-08-12 LAB — CBC
HCT: 47.1 % — ABNORMAL HIGH (ref 36.0–46.0)
Hemoglobin: 15.9 g/dL — ABNORMAL HIGH (ref 12.0–15.0)
MCH: 35.6 pg — ABNORMAL HIGH (ref 26.0–34.0)
MCHC: 33.8 g/dL (ref 30.0–36.0)
MCV: 105.4 fL — ABNORMAL HIGH (ref 80.0–100.0)
Platelets: 194 10*3/uL (ref 150–400)
RBC: 4.47 MIL/uL (ref 3.87–5.11)
RDW: 14.4 % (ref 11.5–15.5)
WBC: 12.9 10*3/uL — ABNORMAL HIGH (ref 4.0–10.5)
nRBC: 0 % (ref 0.0–0.2)

## 2020-08-12 LAB — BASIC METABOLIC PANEL
Anion gap: 8 (ref 5–15)
BUN: 36 mg/dL — ABNORMAL HIGH (ref 8–23)
CO2: 23 mmol/L (ref 22–32)
Calcium: 8.9 mg/dL (ref 8.9–10.3)
Chloride: 104 mmol/L (ref 98–111)
Creatinine, Ser: 1.18 mg/dL — ABNORMAL HIGH (ref 0.44–1.00)
GFR, Estimated: 46 mL/min — ABNORMAL LOW (ref >60)
Glucose, Bld: 132 mg/dL — ABNORMAL HIGH (ref 70–99)
Potassium: 5 mmol/L (ref 3.5–5.1)
Sodium: 135 mmol/L (ref 135–145)

## 2020-08-12 LAB — PROTIME-INR
INR: 1.3 — ABNORMAL HIGH (ref 0.8–1.2)
Prothrombin Time: 15.2 seconds (ref 11.4–15.2)

## 2020-08-12 LAB — SURGICAL PCR SCREEN
MRSA, PCR: NEGATIVE
Staphylococcus aureus: NEGATIVE

## 2020-08-12 LAB — MAGNESIUM: Magnesium: 1.8 mg/dL (ref 1.7–2.4)

## 2020-08-12 SURGERY — FIXATION, FEMUR, NECK, PERCUTANEOUS, USING SCREW
Anesthesia: General | Site: Hip | Laterality: Left

## 2020-08-12 MED ORDER — FLEET ENEMA 7-19 GM/118ML RE ENEM: 1.0000 | ENEMA | Freq: Once | RECTAL | Status: DC | PRN

## 2020-08-12 MED ORDER — PHENOL 1.4 % MT LIQD
1.0000 | OROMUCOSAL | Status: DC | PRN
  Filled 2020-08-12: qty 177

## 2020-08-12 MED ORDER — FENTANYL CITRATE (PF) 100 MCG/2ML IJ SOLN
INTRAMUSCULAR | Status: AC
  Filled 2020-08-12: qty 2

## 2020-08-12 MED ORDER — LIDOCAINE HCL (CARDIAC) PF 100 MG/5ML IV SOSY
PREFILLED_SYRINGE | INTRAVENOUS | Status: DC | PRN
  Administered 2020-08-12: 60 mg via INTRAVENOUS

## 2020-08-12 MED ORDER — DEXAMETHASONE SODIUM PHOSPHATE 10 MG/ML IJ SOLN
INTRAMUSCULAR | Status: DC | PRN
  Administered 2020-08-12: 10 mg via INTRAVENOUS

## 2020-08-12 MED ORDER — ONDANSETRON HCL 4 MG/2ML IJ SOLN: 4.0000 mg | Freq: Once | INTRAMUSCULAR | Status: DC | PRN

## 2020-08-12 MED ORDER — PROPOFOL 10 MG/ML IV BOLUS
INTRAVENOUS | Status: DC | PRN
  Administered 2020-08-12: 70 mg via INTRAVENOUS

## 2020-08-12 MED ORDER — EPHEDRINE SULFATE 50 MG/ML IJ SOLN
INTRAMUSCULAR | Status: DC | PRN
  Administered 2020-08-12: 5 mg via INTRAVENOUS
  Administered 2020-08-12: 10 mg via INTRAVENOUS
  Administered 2020-08-12: 5 mg via INTRAVENOUS
  Administered 2020-08-12: 15 mg via INTRAVENOUS

## 2020-08-12 MED ORDER — METOCLOPRAMIDE HCL 10 MG PO TABS
5.0000 mg | ORAL_TABLET | Freq: Three times a day (TID) | ORAL | Status: DC | PRN
Start: 2020-08-12 — End: 2020-08-15

## 2020-08-12 MED ORDER — METHOTREXATE 2.5 MG PO TABS
17.5000 mg | ORAL_TABLET | ORAL | Status: DC
  Administered 2020-08-15: 17.5 mg via ORAL
  Filled 2020-08-12: qty 7

## 2020-08-12 MED ORDER — SUGAMMADEX SODIUM 500 MG/5ML IV SOLN
INTRAVENOUS | Status: AC
  Filled 2020-08-12: qty 5

## 2020-08-12 MED ORDER — ONDANSETRON HCL 4 MG/2ML IJ SOLN
INTRAMUSCULAR | Status: DC | PRN
  Administered 2020-08-12: 4 mg via INTRAVENOUS

## 2020-08-12 MED ORDER — OXYCODONE HCL 5 MG/5ML PO SOLN: 5.0000 mg | Freq: Once | ORAL | Status: DC | PRN

## 2020-08-12 MED ORDER — BUPIVACAINE-EPINEPHRINE (PF) 0.5% -1:200000 IJ SOLN
INTRAMUSCULAR | Status: DC | PRN
  Administered 2020-08-12: 30 mL via PERINEURAL

## 2020-08-12 MED ORDER — SODIUM CHLORIDE 0.9 % IV SOLN
INTRAVENOUS | Status: DC | PRN
  Administered 2020-08-12: 50 ug/min via INTRAVENOUS

## 2020-08-12 MED ORDER — OCUVITE-LUTEIN PO CAPS
1.0000 | ORAL_CAPSULE | Freq: Every day | ORAL | Status: DC
  Administered 2020-08-13 – 2020-08-15 (×3): 1 via ORAL
  Filled 2020-08-12 (×3): qty 1

## 2020-08-12 MED ORDER — VASOPRESSIN 20 UNIT/ML IV SOLN
INTRAVENOUS | Status: DC | PRN
  Administered 2020-08-12: 1 [IU] via INTRAVENOUS

## 2020-08-12 MED ORDER — ACETAMINOPHEN 325 MG PO TABS
325.0000 mg | ORAL_TABLET | Freq: Four times a day (QID) | ORAL | Status: DC | PRN
  Administered 2020-08-14: 650 mg via ORAL
  Filled 2020-08-12: qty 2

## 2020-08-12 MED ORDER — MIRTAZAPINE 15 MG PO TABS
15.0000 mg | ORAL_TABLET | Freq: Every day | ORAL | Status: DC
  Administered 2020-08-12 – 2020-08-14 (×3): 15 mg via ORAL
  Filled 2020-08-12 (×3): qty 1

## 2020-08-12 MED ORDER — MENTHOL 3 MG MT LOZG
1.0000 | LOZENGE | OROMUCOSAL | Status: DC | PRN
  Filled 2020-08-12: qty 9

## 2020-08-12 MED ORDER — PHENYLEPHRINE HCL (PRESSORS) 10 MG/ML IV SOLN
INTRAVENOUS | Status: DC | PRN
  Administered 2020-08-12: 100 ug via INTRAVENOUS
  Administered 2020-08-12: 200 ug via INTRAVENOUS
  Administered 2020-08-12 (×3): 100 ug via INTRAVENOUS
  Administered 2020-08-12: 200 ug via INTRAVENOUS
  Administered 2020-08-12: 100 ug via INTRAVENOUS
  Administered 2020-08-12: 200 ug via INTRAVENOUS

## 2020-08-12 MED ORDER — APIXABAN 2.5 MG PO TABS
2.5000 mg | ORAL_TABLET | Freq: Two times a day (BID) | ORAL | Status: DC
  Administered 2020-08-13 – 2020-08-15 (×5): 2.5 mg via ORAL
  Filled 2020-08-12 (×5): qty 1

## 2020-08-12 MED ORDER — OXYCODONE HCL 5 MG PO TABS: 5.0000 mg | ORAL_TABLET | Freq: Once | ORAL | Status: DC | PRN

## 2020-08-12 MED ORDER — HYDROCODONE-ACETAMINOPHEN 7.5-325 MG PO TABS: 1.0000 | ORAL_TABLET | ORAL | Status: DC | PRN

## 2020-08-12 MED ORDER — FENTANYL CITRATE (PF) 100 MCG/2ML IJ SOLN
INTRAMUSCULAR | Status: DC | PRN
  Administered 2020-08-12: 50 ug via INTRAVENOUS
  Administered 2020-08-12 (×2): 25 ug via INTRAVENOUS
  Administered 2020-08-12: 50 ug via INTRAVENOUS

## 2020-08-12 MED ORDER — HYDROCODONE-ACETAMINOPHEN 5-325 MG PO TABS: 1.0000 | ORAL_TABLET | ORAL | Status: DC | PRN

## 2020-08-12 MED ORDER — BISACODYL 10 MG RE SUPP
10.0000 mg | Freq: Every day | RECTAL | Status: DC | PRN
Start: 2020-08-12 — End: 2020-08-15

## 2020-08-12 MED ORDER — FERROUS SULFATE 325 (65 FE) MG PO TABS
325.0000 mg | ORAL_TABLET | Freq: Every day | ORAL | Status: DC
  Administered 2020-08-13 – 2020-08-15 (×3): 325 mg via ORAL
  Filled 2020-08-12 (×3): qty 1

## 2020-08-12 MED ORDER — SODIUM CHLORIDE 0.45 % IV SOLN: INTRAVENOUS | Status: DC

## 2020-08-12 MED ORDER — SUGAMMADEX SODIUM 200 MG/2ML IV SOLN
INTRAVENOUS | Status: DC | PRN
  Administered 2020-08-12: 200 mg via INTRAVENOUS

## 2020-08-12 MED ORDER — ROCURONIUM BROMIDE 100 MG/10ML IV SOLN
INTRAVENOUS | Status: DC | PRN
  Administered 2020-08-12: 10 mg via INTRAVENOUS

## 2020-08-12 MED ORDER — SENNA 8.6 MG PO TABS
1.0000 | ORAL_TABLET | Freq: Two times a day (BID) | ORAL | Status: DC
  Administered 2020-08-12 – 2020-08-15 (×6): 8.6 mg via ORAL
  Filled 2020-08-12 (×6): qty 1

## 2020-08-12 MED ORDER — MORPHINE SULFATE (PF) 2 MG/ML IV SOLN: 0.5000 mg | INTRAVENOUS | Status: DC | PRN

## 2020-08-12 MED ORDER — ACETAMINOPHEN 10 MG/ML IV SOLN: 1000.0000 mg | Freq: Once | INTRAVENOUS | Status: DC | PRN

## 2020-08-12 MED ORDER — SUCCINYLCHOLINE CHLORIDE 20 MG/ML IJ SOLN
INTRAMUSCULAR | Status: DC | PRN
  Administered 2020-08-12: 100 mg via INTRAVENOUS

## 2020-08-12 MED ORDER — FENTANYL CITRATE (PF) 100 MCG/2ML IJ SOLN: 25.0000 ug | INTRAMUSCULAR | Status: DC | PRN

## 2020-08-12 MED ORDER — RAMIPRIL 10 MG PO CAPS
10.0000 mg | ORAL_CAPSULE | Freq: Every day | ORAL | Status: DC
  Administered 2020-08-14: 10 mg via ORAL
  Filled 2020-08-12 (×4): qty 1

## 2020-08-12 MED ORDER — MUPIROCIN 2 % EX OINT
1.0000 "application " | TOPICAL_OINTMENT | Freq: Two times a day (BID) | CUTANEOUS | Status: DC
  Filled 2020-08-12: qty 22

## 2020-08-12 MED ORDER — CEFAZOLIN SODIUM-DEXTROSE 2-4 GM/100ML-% IV SOLN
2.0000 g | Freq: Three times a day (TID) | INTRAVENOUS | Status: AC
  Administered 2020-08-12 – 2020-08-13 (×3): 2 g via INTRAVENOUS
  Filled 2020-08-12 (×3): qty 100

## 2020-08-12 MED ORDER — CHLORHEXIDINE GLUCONATE CLOTH 2 % EX PADS: 6.0000 | MEDICATED_PAD | Freq: Every day | CUTANEOUS | Status: DC

## 2020-08-12 MED ORDER — ALUM & MAG HYDROXIDE-SIMETH 200-200-20 MG/5ML PO SUSP: 30.0000 mL | ORAL | Status: DC | PRN

## 2020-08-12 MED ORDER — ZOLPIDEM TARTRATE 5 MG PO TABS
5.0000 mg | ORAL_TABLET | Freq: Every evening | ORAL | Status: DC | PRN
  Administered 2020-08-13 – 2020-08-14 (×2): 5 mg via ORAL
  Filled 2020-08-12 (×2): qty 1

## 2020-08-12 MED ORDER — METOCLOPRAMIDE HCL 5 MG/ML IJ SOLN
5.0000 mg | Freq: Three times a day (TID) | INTRAMUSCULAR | Status: DC | PRN
Start: 2020-08-12 — End: 2020-08-15

## 2020-08-12 MED ORDER — MAGNESIUM HYDROXIDE 400 MG/5ML PO SUSP
30.0000 mL | Freq: Every day | ORAL | Status: DC | PRN
Start: 2020-08-12 — End: 2020-08-15
  Administered 2020-08-13: 30 mL via ORAL
  Filled 2020-08-12: qty 30

## 2020-08-12 SURGICAL SUPPLY — 27 items
BLADE SURG SZ11 CARB STEEL (BLADE) ×2 IMPLANT
BNDG COHESIVE 4X5 TAN STRL (GAUZE/BANDAGES/DRESSINGS) ×1 IMPLANT
CHLORAPREP W/TINT 26 (MISCELLANEOUS) ×2 IMPLANT
COVER WAND RF STERILE (DRAPES) ×2 IMPLANT
DRSG AQUACEL AG ADV 3.5X10 (GAUZE/BANDAGES/DRESSINGS) ×1 IMPLANT
GAUZE SPONGE 4X4 12PLY STRL (GAUZE/BANDAGES/DRESSINGS) ×2 IMPLANT
GLOVE SURG ENC MOIS LTX SZ8 (GLOVE) ×2 IMPLANT
GLOVE SURG ORTHO LTX SZ8.5 (GLOVE) ×2 IMPLANT
GLOVE SURG XRAY 8.5 LX (GLOVE) ×2 IMPLANT
GOWN STRL REUS W/ TWL LRG LVL3 (GOWN DISPOSABLE) ×1 IMPLANT
GOWN STRL REUS W/TWL LRG LVL3 (GOWN DISPOSABLE) ×1
GOWN STRL REUS W/TWL LRG LVL4 (GOWN DISPOSABLE) ×2 IMPLANT
KIT TURNOVER KIT A (KITS) ×2 IMPLANT
MANIFOLD NEPTUNE II (INSTRUMENTS) ×2 IMPLANT
MAT ABSORB  FLUID 56X50 GRAY (MISCELLANEOUS) ×1
MAT ABSORB FLUID 56X50 GRAY (MISCELLANEOUS) ×1 IMPLANT
NDL SPNL 18GX3.5 QUINCKE PK (NEEDLE) ×1 IMPLANT
NEEDLE SPNL 18GX3.5 QUINCKE PK (NEEDLE) ×2 IMPLANT
NS IRRIG 500ML POUR BTL (IV SOLUTION) ×2 IMPLANT
PACK HIP COMPR (MISCELLANEOUS) ×2 IMPLANT
SCREW CANN 32 THRD/90 7.3 (Screw) ×1 IMPLANT
SCREW CANN 32 THRD/95 7.3 (Screw) ×2 IMPLANT
SCREW CANN THREADED 7.3X85 (Screw) ×1 IMPLANT
STRAP SAFETY 5IN WIDE (MISCELLANEOUS) ×2 IMPLANT
SUT ETHILON 3 0 FSLX (SUTURE) ×2 IMPLANT
SYR 30ML LL (SYRINGE) ×2 IMPLANT
WIRE K 2.8X14 THR (MISCELLANEOUS) ×4 IMPLANT

## 2020-08-12 NOTE — H&P (Signed)
THE PATIENT WAS SEEN PRIOR TO SURGERY TODAY.  HISTORY, ALLERGIES, HOME MEDICATIONS AND OPERATIVE PROCEDURE WERE REVIEWED. RISKS AND BENEFITS OF SURGERY DISCUSSED WITH PATIENT AGAIN.  NO CHANGES FROM INITIAL HISTORY AND PHYSICAL NOTED.    

## 2020-08-12 NOTE — Anesthesia Procedure Notes (Signed)
Procedure Name: Intubation Date/Time: 08/12/2020 11:38 AM Performed by: Lily Peer, Nicanor Mendolia, CRNA Pre-anesthesia Checklist: Patient identified, Emergency Drugs available, Suction available and Patient being monitored Patient Re-evaluated:Patient Re-evaluated prior to induction Oxygen Delivery Method: Circle system utilized Preoxygenation: Pre-oxygenation with 100% oxygen Induction Type: IV induction Laryngoscope Size: McGraph and 3 Grade View: Grade I Tube type: Oral Tube size: 6.5 mm Number of attempts: 1 Airway Equipment and Method: Stylet Placement Confirmation: ETT inserted through vocal cords under direct vision,  positive ETCO2 and breath sounds checked- equal and bilateral Secured at: 19 cm Tube secured with: Tape Dental Injury: Teeth and Oropharynx as per pre-operative assessment

## 2020-08-12 NOTE — Op Note (Signed)
08/12/2020  12:42 PM  PATIENT:  Angela Leonard    PRE-OPERATIVE DIAGNOSIS:  Left Hip Fracture impacted subcapital  POST-OPERATIVE DIAGNOSIS:  Same  PROCEDURE:  CANNULATED HIP PINNING left hip  SURGEON:  Park Breed, MD     ANESTHESIA: General endotracheal  PREOPERATIVE INDICATIONS:  [name] is a  86 y.o. female who fell and was found to have a diagnosis of Left Hip Fracture who elected for surgical management.    The risks benefits and alternatives were discussed with the patient preoperatively including but not limited to the risks of infection, bleeding, nerve injury, cardiopulmonary complications, blood clots, malunion, nonunion, avascular necrosis, the need for revision surgery, the potential for conversion to hemiarthroplasty, among others, and the patient was willing to proceed.  OPERATIVE IMPLANTS: 7.3 mm cannulated screws x4  OPERATIVE FINDINGS: Clinical osteoporosis with weak bone, proximal femur  OPERATIVE PROCEDURE: The patient was brought to the operating room and placed in supine position. IV antibiotics were given. General anesthesia administered.  The patient was placed on the fracture table. The operative extremity was positioned, without any significant reduction maneuver and was prepped and draped in usual sterile fashion.  Time out was performed.  Small incisions were made distal to the greater trochanter, and 4 guidewires were introduced into the head and neck. The lengths were measured. The reduction was slightly valgus, and near-anatomic. I opened the cortex with a cannulated drill, and then placed the screws into position. Satisfactory fixation was achieved.  The wounds were irrigated copiously, and repaired with  3-O Nylon with and sterile gauze. Sponge and needle count were correct.  There no complications and the patient tolerated the procedure well.  The patient will be touch down weightbearing as tolerated, and will be on Lovenox  for DVT  prophylaxis.   Park Breed, MD

## 2020-08-12 NOTE — Progress Notes (Signed)
   08/12/20 2219 08/12/20 2327  Assess: MEWS Score  Temp (!) 97.5 F (36.4 C) (!) 97.4 F (36.3 C)  BP (!) 78/50 (!) 79/56  Pulse Rate 60 64  Resp 19 16  Level of Consciousness Alert Alert  SpO2 94 % 94 %  O2 Device Room Air Room Air  Patient Activity (if Appropriate) In bed In bed

## 2020-08-12 NOTE — Progress Notes (Signed)
PT Cancellation Note  Patient Details Name: Angela Leonard MRN: [id number] DOB: 12/05/1934   Cancelled Treatment:    Reason Eval/Treat Not Completed: Patient at procedure or test/unavailable;Other (comment) (pt is scheduled to have surgery today, PT to assess once new orders are placed post operatively)   Isaias Cowman 08/12/2020, 10:53 AM

## 2020-08-12 NOTE — Progress Notes (Signed)
OT Cancellation Note  Patient Details Name: Aadvika Konen MRN: [id number] DOB: 09-Apr-1935   Cancelled Treatment:    Reason Eval/Treat Not Completed: Patient at procedure or test/ unavailable (Nursing reports the pt. is preparing for Hip surgery today. WIll attempt the initial OT eval at a later date once new orders are in place following surgery.)  Harrel Carina, MS, OTR/L 08/12/2020, 10:01 AM

## 2020-08-12 NOTE — Anesthesia Postprocedure Evaluation (Signed)
Anesthesia Post Note  Patient: Angela Leonard  Procedure(s) Performed: CANNULATED HIP PINNING (Left Hip)  Patient location during evaluation: PACU Anesthesia Type: General Level of consciousness: awake and alert and confused (confused as per baseline) Pain management: pain level controlled Vital Signs Assessment: post-procedure vital signs reviewed and stable Respiratory status: spontaneous breathing, nonlabored ventilation, respiratory function stable and patient connected to nasal cannula oxygen Cardiovascular status: blood pressure returned to baseline and stable Postop Assessment: no apparent nausea or vomiting Anesthetic complications: no   No complications documented.   Last Vitals:  Vitals:   08/12/20 1315 08/12/20 1330  BP: (!) 92/52 90/60  Pulse: 95   Resp: 12 16  Temp:    SpO2: 90%     Last Pain:  Vitals:   08/12/20 1315  TempSrc:   PainSc: 0-No pain                 Arita Miss

## 2020-08-12 NOTE — Progress Notes (Signed)
Patient ID: Angela Leonard, female   DOB: April 02, 1935, 86 y.o.   MRN: [id number] [facility] PROGRESS NOTE  Angela Leonard MVE:[id number] DOB: 1934/08/07 DOA: 08/11/2020 PCP: Derinda Late, MD  HPI/Subjective: Patient seen this morning prior to surgery.  She stated that she was standing and felt her left leg gave out and ended up on the floor.  Having pain in the leg.  Found to have a left femoral neck fracture.  Objective: Vitals:   08/12/20 1415 08/12/20 1445  BP: 94/74 (!) 91/58  Pulse: 88 87  Resp: 18 18  Temp: 97.7 F (36.5 C) 97.8 F (36.6 C)  SpO2: 97% 93%    Intake/Output Summary (Last 24 hours) at 08/12/2020 1529 Last data filed at 08/12/2020 1415 Gross per 24 hour  Intake 1765.81 ml  Output 5 ml  Net 1760.81 ml   Filed Weights   08/11/20 0723  Weight: 74.8 kg    ROS: Review of Systems  Respiratory: Negative for shortness of breath.   Cardiovascular: Negative for chest pain.  Gastrointestinal: Negative for abdominal pain, nausea and vomiting.  Musculoskeletal: Positive for joint pain.   Exam: Physical Exam HENT:     Mouth/Throat:     Pharynx: No oropharyngeal exudate.  Eyes:     General: Lids are normal.     Conjunctiva/sclera: Conjunctivae normal.  Cardiovascular:     Rate and Rhythm: Tachycardia present. Rhythm irregularly irregular.     Heart sounds: Normal heart sounds, S1 normal and S2 normal.  Pulmonary:     Breath sounds: Normal breath sounds. No decreased breath sounds, wheezing, rhonchi or rales.  Abdominal:     Palpations: Abdomen is soft.     Tenderness: There is no abdominal tenderness.  Musculoskeletal:     Right lower leg: No swelling.     Left lower leg: No swelling.  Skin:    General: Skin is warm.     Comments: Large area on the forehead that looks like a basal cell carcinoma  Neurological:     Mental Status: She is alert.     Comments: Answers some yes or no questions.       Data Reviewed: Basic Metabolic Panel: Recent  Labs  Lab 08/11/20 0806 08/12/20 0446  NA 136 135  K 4.2 5.0  CL 102 104  CO2 25 23  GLUCOSE 103* 132*  BUN 38* 36*  CREATININE 0.94 1.18*  CALCIUM 9.6 8.9  MG  --  1.8   CBC: Recent Labs  Lab 08/11/20 0806 08/12/20 0446  WBC 13.1* 12.9*  NEUTROABS 11.3*  --   HGB 16.2* 15.9*  HCT 48.3* 47.1*  MCV 103.6* 105.4*  PLT 212 194     Recent Results (from the past 240 hour(s))  Resp Panel by RT-PCR (Flu A&B, Covid) Nasopharyngeal Swab     Status: None   Collection Time: 08/11/20  8:07 AM   Specimen: Nasopharyngeal Swab; Nasopharyngeal(NP) swabs in vial transport medium  Result Value Ref Range Status   SARS Coronavirus 2 by RT PCR NEGATIVE NEGATIVE Final    Comment: (NOTE) SARS-CoV-2 target nucleic acids are NOT DETECTED.  The SARS-CoV-2 RNA is generally detectable in upper respiratory specimens during the acute phase of infection. The lowest concentration of SARS-CoV-2 viral copies this assay can detect is 138 copies/mL. A negative result does not preclude SARS-Cov-2 infection and should not be used as the sole basis for treatment or other patient management decisions. A negative result may occur with  improper specimen collection/handling, submission of  specimen other than nasopharyngeal swab, presence of viral mutation(s) within the areas targeted by this assay, and inadequate number of viral copies(<138 copies/mL). A negative result must be combined with clinical observations, patient history, and epidemiological information. The expected result is Negative.  Fact Sheet for Patients:  EntrepreneurPulse.com.au  Fact Sheet for Healthcare Providers:  IncredibleEmployment.be  This test is no t yet approved or cleared by the [place] FDA and  has been authorized for detection and/or diagnosis of SARS-CoV-2 by FDA under an Emergency Use Authorization (EUA). This EUA will remain  in effect (meaning this test can be used) for the  duration of the COVID-19 declaration under Section 564(b)(1) of the Act, 21 U.S.C.section 360bbb-3(b)(1), unless the authorization is terminated  or revoked sooner.       Influenza A by PCR NEGATIVE NEGATIVE Final   Influenza B by PCR NEGATIVE NEGATIVE Final    Comment: (NOTE) The Xpert Xpress SARS-CoV-2/FLU/RSV plus assay is intended as an aid in the diagnosis of influenza from Nasopharyngeal swab specimens and should not be used as a sole basis for treatment. Nasal washings and aspirates are unacceptable for Xpert Xpress SARS-CoV-2/FLU/RSV testing.  Fact Sheet for Patients: EntrepreneurPulse.com.au  Fact Sheet for Healthcare Providers: IncredibleEmployment.be  This test is not yet approved or cleared by the [place] FDA and has been authorized for detection and/or diagnosis of SARS-CoV-2 by FDA under an Emergency Use Authorization (EUA). This EUA will remain in effect (meaning this test can be used) for the duration of the COVID-19 declaration under Section 564(b)(1) of the Act, 21 U.S.C. section 360bbb-3(b)(1), unless the authorization is terminated or revoked.  Performed at [facility], [address], [place], [place] [address]   Surgical PCR screen     Status: None   Collection Time: 08/12/20  8:34 AM   Specimen: Nasal Mucosa; Nasal Swab  Result Value Ref Range Status   MRSA, PCR NEGATIVE NEGATIVE Final   Staphylococcus aureus NEGATIVE NEGATIVE Final    Comment: (NOTE) The Xpert SA Assay (FDA approved for NASAL specimens in patients 22 years of age and older), is one component of a comprehensive surveillance program. It is not intended to diagnose infection nor to guide or monitor treatment. Performed at [facility], [address] [place], [place] [address]      Studies: DG Chest 1 View  Result Date: 08/11/2020 CLINICAL DATA:  Left hip pain EXAM: CHEST  1 VIEW COMPARISON:  08/24/2019 FINDINGS:  Chronic interstitial lung changes similar to the prior study. No definite new consolidation or edema. No pleural effusion. No pneumothorax. Stable cardiomediastinal contours. IMPRESSION: Chronic interstitial lung changes.  No definite acute abnormality. Electronically Signed   By: Macy Mis M.D.   On: 08/11/2020 07:53   CT Hip Left Wo Contrast  Result Date: 08/11/2020 CLINICAL DATA:  [place].  Hip pain. EXAM: CT OF THE LEFT HIP WITHOUT CONTRAST TECHNIQUE: Multidetector CT imaging of the left hip was performed according to the standard protocol. Multiplanar CT image reconstructions were also generated. COMPARISON:  Radiographs, same date and prior CT scan 09/25/2019 FINDINGS: There is a nondisplaced and very slightly impacted left femoral neck fracture. No other fractures are identified. The pubic symphysis and visualized bony pelvis is intact. There are mild left hip joint degenerative changes moderate to advanced right hip joint degenerative changes. Chondrocalcinosis noted involving both hips and the pubic symphysis. The surrounding hip and pelvic musculature are grossly normal. No obvious muscle tear or large intramuscular hematoma. The patients  endometrium is thickened for age measuring up to 12 mm. Findings are also seen on prior MRI of the pelvis from 02/19/2019, CT scan 09/25/2018 and MRI from 09/15/2017. Given its stability this is most likely due to cervical stenosis and not endometrial cancer. IMPRESSION: 1. Nondisplaced and very slightly impacted left femoral neck fracture. 2. Moderate to advanced right hip joint degenerative changes. 3. Chondrocalcinosis involving both hips and the pubic symphysis. 4. Thickened endometrium for age measuring up to 12 mm. Given its stability since prior imaging dating back to 2019, this is most likely due to cervical stenosis and not endometrial cancer. Electronically Signed   By: Marijo Sanes M.D.   On: 08/11/2020 09:16   DG Hip Unilat W or Wo Pelvis 2-3 Views  Left  Result Date: 08/11/2020 CLINICAL DATA:  Left hip pain after fall. EXAM: DG HIP (WITH OR WITHOUT PELVIS) 2-3V LEFT COMPARISON:  CT left hip dated Sep 25, 2019. Left hip x-rays dated September 24, 2019. FINDINGS: Questionable cortical irregularity at the left superior femoral head neck junction. No dislocation. Unchanged mild to moderate right hip osteoarthritis. Osteopenia. Soft tissues are unremarkable. IMPRESSION: 1. Questionable cortical irregularity at the left superior femoral head neck junction may represent an underlying acute nondisplaced fracture. Recommend CT of the left hip for further evaluation. Electronically Signed   By: Titus Dubin M.D.   On: 08/11/2020 08:24    Scheduled Meds: . [START ON 08/13/2020] apixaban  2.5 mg Oral Q12H  . Chlorhexidine Gluconate Cloth  6 each Topical Daily  . cholecalciferol  1,000 Units Oral Daily  . diltiazem  180 mg Oral Daily  . [START ON 08/13/2020] ferrous sulfate  325 mg Oral Q breakfast  . folic acid  1 mg Oral Daily  . [START ON 08/15/2020] methotrexate  17.5 mg Oral Q Tue  . mirtazapine  15 mg Oral QHS  . multivitamin-lutein  1 capsule Oral Daily  . ramipril  10 mg Oral Daily  . senna  1 tablet Oral BID  . simvastatin  20 mg Oral QHS  . vitamin B-12  1,000 mcg Oral Daily   Continuous Infusions: . sodium chloride 75 mL/hr at 08/12/20 0832  .  ceFAZolin (ANCEF) IV      Assessment/Plan:  1. Closed left hip fracture requiring operative repair.  Patient had pinning procedure today by Dr. Sabra Heck orthopedic.  Pain control. 2. Atrial fibrillation with rapid ventricular response.  Continue diltiazem for rate control.  Will go back on Eliquis post procedure. 3. Relative hypotension.  Hold ramipril.  Continue IV fluids. 4. Large mass on forehead likely a basal cell carcinoma.  Daughter states he does not bleed. 5. Dementia.  On mirtazapine at night 6. Hyperlipidemia unspecified on simvastatin  7. Macrocytosis check a B12 level tomorrow  morning        Code Status:     Code Status Orders  (From admission, onward)         Start     Ordered   08/11/20 1529  Limited resuscitation (code)  Continuous       Question Answer Comment  In the event of cardiac or respiratory ARREST: Initiate Code Blue, Call Rapid Response Yes   In the event of cardiac or respiratory ARREST: Perform CPR No   In the event of cardiac or respiratory ARREST: Perform Intubation/Mechanical Ventilation Yes   In the event of cardiac or respiratory ARREST: Use NIPPV/BiPAp only if indicated Yes   In the event of cardiac or respiratory ARREST:  Administer ACLS medications if indicated Yes   In the event of cardiac or respiratory ARREST: Perform Defibrillation or Cardioversion if indicated Yes      08/11/20 1528        Code Status History    Date Active Date Inactive Code Status Order ID Comments User Context   08/24/2019 1241 08/27/2019 2346 Full Code [id number]  Ivor Costa, MD ED   02/19/2019 1337 02/22/2019 1923 Full Code [id number]  Epifanio Lesches, MD ED   08/04/2018 0031 08/06/2018 1859 Full Code [id number]  Lance Coon, MD Inpatient   09/15/2017 0620 09/17/2017 1431 Full Code [id number]  Harrie Foreman, MD Inpatient   10/13/2014 0556 10/15/2014 2057 Full Code [id number]  Molly Maduro, MD ED   Advance Care Planning Activity     Family Communication: Spoke with daughter on the phone Disposition Plan: Status is: Inpatient  Dispo:  Patient From: Home  Planned Disposition: Home with [facility]  Medically stable for discharge: No    Time spent: 28 minutes  [place]

## 2020-08-12 NOTE — Transfer of Care (Signed)
Immediate Anesthesia Transfer of Care Note  Patient: Angela Leonard  Procedure(s) Performed: CANNULATED HIP PINNING (Left Hip)  Patient Location: PACU  Anesthesia Type:General  Level of Consciousness: drowsy  Airway & Oxygen Therapy: Patient Spontanous Breathing and Patient connected to face mask oxygen  Post-op Assessment: Report given to RN and Post -op Vital signs reviewed and stable  Post vital signs: Reviewed and stable  Last Vitals:  Vitals Value Taken Time  BP 97/59 08/12/20 1246  Temp    Pulse 86 08/12/20 1248  Resp 12 08/12/20 1248  SpO2 100 % 08/12/20 1248  Vitals shown include unvalidated device data.  Last Pain:  Vitals:   08/12/20 0830  TempSrc:   PainSc: (P) 0-No pain         Complications: No complications documented.

## 2020-08-13 LAB — CBC
HCT: 37.7 % (ref 36.0–46.0)
Hemoglobin: 12.5 g/dL (ref 12.0–15.0)
MCH: 35.5 pg — ABNORMAL HIGH (ref 26.0–34.0)
MCHC: 33.2 g/dL (ref 30.0–36.0)
MCV: 107.1 fL — ABNORMAL HIGH (ref 80.0–100.0)
Platelets: 153 10*3/uL (ref 150–400)
RBC: 3.52 MIL/uL — ABNORMAL LOW (ref 3.87–5.11)
RDW: 14.8 % (ref 11.5–15.5)
WBC: 15.2 10*3/uL — ABNORMAL HIGH (ref 4.0–10.5)
nRBC: 0.1 % (ref 0.0–0.2)

## 2020-08-13 LAB — BASIC METABOLIC PANEL
Anion gap: 6 (ref 5–15)
BUN: 48 mg/dL — ABNORMAL HIGH (ref 8–23)
CO2: 20 mmol/L — ABNORMAL LOW (ref 22–32)
Calcium: 8 mg/dL — ABNORMAL LOW (ref 8.9–10.3)
Chloride: 108 mmol/L (ref 98–111)
Creatinine, Ser: 1.17 mg/dL — ABNORMAL HIGH (ref 0.44–1.00)
GFR, Estimated: 46 mL/min — ABNORMAL LOW (ref >60)
Glucose, Bld: 180 mg/dL — ABNORMAL HIGH (ref 70–99)
Potassium: 4.8 mmol/L (ref 3.5–5.1)
Sodium: 134 mmol/L — ABNORMAL LOW (ref 135–145)

## 2020-08-13 LAB — VITAMIN B12: Vitamin B-12: 497 pg/mL (ref 180–914)

## 2020-08-13 MED ORDER — LACTATED RINGERS IV BOLUS
1000.0000 mL | Freq: Once | INTRAVENOUS | Status: AC
  Administered 2020-08-13: 1000 mL via INTRAVENOUS

## 2020-08-13 MED ORDER — DILTIAZEM HCL ER COATED BEADS 120 MG PO CP24
120.0000 mg | ORAL_CAPSULE | Freq: Every day | ORAL | Status: DC
  Administered 2020-08-14 – 2020-08-15 (×2): 120 mg via ORAL
  Filled 2020-08-13 (×3): qty 1

## 2020-08-13 NOTE — Evaluation (Signed)
Physical Therapy Evaluation Patient Details Name: Angela Leonard MRN: [id number] DOB: 06-10-1934 Today's Date: 08/13/2020   History of Present Illness  86 y/o female with recent fall and subsequent L hip fracture, underwent L hip pinning on 08/12/20. PMH: dementia, a fib on eliquis, essential HTN, hyperlidemia, RA who presented to the ED after mechanical fall at home. She is s/p L hip pinning on 08/12/20.  Clinical Impression  Pt received supine in bed with the HOB elevated. Pt agreeable to PT interventions today. Pt's BP had been reading low prior to today's PT session and was monitored throughout the session with all changes in position. Pt completed there ex as follows: ankle pumps, quad sets with 5 second hold, weight shifts in the seated position x10 each and sit<>stands x4 with maxA and VCs for pace and form. Initially pt's BP in the supine position read: 96/57, pt able to complete bed mobility with modA for LLE management. Pt able to minimally assist with moving LLE due to weakness 2/2 L hip pinning 08/13/20. Pt able to sit EOB with minA from therapist, unilateral UE support and BIL feet supported on the floor. BP in the seated position: 119/90, pt denied any symptoms of dizziness or lightheadedness. Pt provided VC throughout seated position to site more upright without leaning on the bed. WB precautions were clarified with Dr. Sabra Heck via secure chat to be TDWB. Reviewed WB precautions with pt and had her perform verbal teach back. Pt requiring extra time throughout bed mobility due to weakness and fatigue. Completed sit<>stand transfer with mod A, requiring continued modA in the standing position to maintain WB precautions and balance. BP in the standing position: 106/62, pt continued to deny any symptoms of dizziness or lightheadedness. Pt voiced need to have bowel movement, pt transferred back to EOB. Stand pivot transfer completed to [facility] with maxA from therapist, VCs for sequencing, hand placement and  to avoid breaking LLE WB precautions.  Pt able to successfully evacuate her bowels, with modA needed for toileting hygiene. Stand pivot transfer completed back to EOB with max A, pt requiring modA for bed mobility 2/2 fatigue. Following interventions today pt reported fatigue, but no pain.    Follow Up Recommendations SNF    Equipment Recommendations       Recommendations for Other Services       Precautions / Restrictions Precautions Precautions: Other (comment) Precaution Comments: TDWB precautions - WB precautions via secure chat per Dr. Sabra Heck Restrictions Weight Bearing Restrictions: Yes LLE Weight Bearing: Touchdown weight bearing Other Position/Activity Restrictions: TWB per Dr. Sabra Heck secure chat 08/13/20      Mobility  Bed Mobility Overal bed mobility: Needs Assistance Bed Mobility: Supine to Sit     Supine to sit: Mod assist     General bed mobility comments: pt required modA for LLE managment during bed mobility 2/2 LLE strength deficits Patient Response: Cooperative  Transfers Overall transfer level: Needs assistance   Transfers: Stand Pivot Transfers   Stand pivot transfers: Max assist;Mod assist       General transfer comment: Pt with mod-max A to complete stand pivot transfer while maintaining LLE hip precautions  Ambulation/Gait                Stairs            Wheelchair Mobility    Modified Rankin (Stroke Patients Only)       Balance Overall balance assessment: Needs assistance Sitting-balance support: Single extremity supported;Feet supported Sitting balance-[name] Scale: [place] Sitting  balance - Comments: Pt tends to list to the right side when seated EOB with unilater UE support and BIL LE feet supported Postural control: Right lateral lean Standing balance support: Bilateral upper extremity supported Standing balance-[name] Scale: Poor Standing balance comment: Pt unable to maintain standing position withou maxA from  therapist                             Pertinent Vitals/Pain Pain Assessment: Faces Faces Pain Scale: Hurts a little bit Pain Location: L hip Pain Descriptors / Indicators: Aching;Dull Pain Intervention(s): Monitored during session;Repositioned    Home Living Family/patient expects to be discharged to:: Skilled nursing facility Living Arrangements: Children;Spouse/significant other (Pt lives with dtr)                    Prior Function Level of Independence: Needs assistance (Pt questionable historian, potentially used RW and aide for all ADLs prior to current fall)         Comments: Pt demonstrated some confusion during treatment, but was A&Ox3     Hand Dominance   Dominant Hand: Right    Extremity/Trunk Assessment        Lower Extremity Assessment Lower Extremity Assessment: LLE deficits/detail LLE Deficits / Details: Decreased L ROM and strength 2/2 L hip pinning 08/12/20 LLE: Unable to fully assess due to pain LLE Sensation: WNL (Pt reports no N/T)       Communication   Communication: No difficulties  Cognition Arousal/Alertness: Awake/alert Behavior During Therapy: WFL for tasks assessed/performed Overall Cognitive Status: Within Functional Limits for tasks assessed                                 General Comments: Pt was A&Ox3, some mild confusion noted during interventions she is a questionable historian, but she was able to follow verbal directions appropriately      General Comments General comments (skin integrity, edema, etc.): pt's L hip bandage visualized is in tact with no drainage present on exterior    Exercises Total Joint Exercises Ankle Circles/Pumps: AROM;Both;10 reps Quad Sets: AROM;Strengthening;Left;10 reps (5 second hold) Other Exercises Other Exercises: Completed sit<>stand with mod-max A from therapist x4 with multiple verbal and tactile cues to maintain WB precautions throughout transfers    Assessment/Plan    PT Assessment Patient needs continued PT services  PT Problem List Decreased strength;Decreased mobility;Decreased safety awareness;Decreased range of motion;Decreased coordination;Decreased knowledge of precautions;Decreased activity tolerance;Decreased cognition;Decreased balance;Decreased knowledge of use of DME;Pain       PT Treatment Interventions DME instruction;Therapeutic exercise;Gait training;Balance training;Stair training;Functional mobility training;Therapeutic activities;Patient/family education;Neuromuscular re-education    PT Goals (Current goals can be found in the Care Plan section)  Acute Rehab PT Goals Patient Stated Goal: Get stronger PT Goal Formulation: With patient Time For Goal Achievement: 08/27/20 Potential to Achieve Goals: Good    Frequency BID   Barriers to discharge        Co-evaluation               AM-PAC PT "6 Clicks" Mobility  Outcome Measure Help needed turning from your back to your side while in a flat bed without using bedrails?: A Little Help needed moving from lying on your back to sitting on the side of a flat bed without using bedrails?: A Lot Help needed moving to and from a bed to a chair (including a wheelchair)?:  A Lot Help needed standing up from a chair using your arms (e.g., wheelchair or bedside chair)?: A Lot Help needed to walk in hospital room?: A Lot Help needed climbing 3-5 steps with a railing? : A Lot 6 Click Score: 13    End of Session Equipment Utilized During Treatment: Gait belt Activity Tolerance: Patient tolerated treatment well;Patient limited by fatigue;Patient limited by pain Patient left: in bed;with bed alarm set Nurse Communication: Mobility status;Weight bearing status;Other (comment) (Discussed wth Bet, RN WB precautions per Dr. Sabra Heck adn reviewed BP findings during PT evaluation) PT Visit Diagnosis: Unsteadiness on feet (R26.81);Repeated falls (R29.6);Muscle weakness  (generalized) (M62.81)    Time: 5997-7414 PT Time Calculation (min) (ACUTE ONLY): 83 min   Charges:   PT Evaluation $PT Eval Moderate Complexity: 1 Mod PT Treatments $Therapeutic Activity: 23-37 mins        Duanne Guess, PT, DPT 08/13/20, 3:36 PM   Evart Mcdonnell Malcolm [place] 08/13/2020, 3:21 PM

## 2020-08-13 NOTE — Progress Notes (Signed)
Subjective: 1 Day Post-Op Procedure(s) (LRB): CANNULATED HIP PINNING (Left) Patient is alert sitting up in bed.  Not in much pain.  Hemoglobin is 11.5.  Dressing is dry.  Restarting Eliquis.  Patient reports pain as mild.  Objective:   VITALS:   Vitals:   08/13/20 0812 08/13/20 0820  BP: (!) 86/50 (!) 90/50  Pulse: 63 60  Resp: 18 16  Temp: (!) 97.4 F (36.3 C) (!) 97.4 F (36.3 C)  SpO2: 95% 95%    Neurologically intact Neurovascular intact Incision: dressing C/D/I  LABS Recent Labs    08/11/20 0806 08/12/20 0446 08/13/20 0421  HGB 16.2* 15.9* 12.5  HCT 48.3* 47.1* 37.7  WBC 13.1* 12.9* 15.2*  PLT 212 194 153    Recent Labs    08/11/20 0806 08/12/20 0446 08/13/20 0421  NA 136 135 134*  K 4.2 5.0 4.8  BUN 38* 36* 48*  CREATININE 0.94 1.18* 1.17*  GLUCOSE 103* 132* 180*    Recent Labs    08/11/20 0806 08/12/20 0446  INR 1.3* 1.3*     Assessment/Plan: 1 Day Post-Op Procedure(s) (LRB): CANNULATED HIP PINNING (Left)   Advance diet Up with therapy Discharge to SNF   Will remain toe-touch weightbearing on the left for 6 weeks.  Have restarted Eliquis at a low dose to start with.  Follow-up in my clinic in 2 weeks after discharge

## 2020-08-13 NOTE — NC FL2 (Signed)
[place] LEVEL OF CARE SCREENING TOOL     IDENTIFICATION  Patient Name: Angela Leonard Birthdate: 1935/03/28 Sex: female Admission Date (Current Location): 08/11/2020  [place] and [place] Number:  Engineering geologist and Address:  [facility], [address] [place], [place] [address]      Provider Number: [id number]  Attending Physician Name and Address:  Loletha Grayer, MD  Relative Name and Phone Number:  Lamia Mariner- [phone]    Current Level of Care: Hospital Recommended Level of Care: [place] Prior Approval Number:    Date Approved/Denied:   PASRR Number: [id number]  Discharge Plan:      Current Diagnoses: Patient Active Problem List   Diagnosis Date Noted  . Atrial fibrillation with RVR ([place])   . Hypotension   . Mass of skin of head   . Dementia without behavioral disturbance ([place])   . Closed hip fracture requiring operative repair, left, sequela 08/11/2020  . Dehydration 08/25/2019  . UTI (urinary tract infection) 08/24/2019  . Fall at home, initial encounter 08/24/2019  . Hypokalemia 08/24/2019  . RA (rheumatoid arthritis) ([place]) 08/24/2019  . Fall 08/24/2019  . Facial laceration   . Chronic atrial fibrillation, unspecified ([place]) 02/19/2019  . Sepsis ([place]) 08/03/2018  . Atypical pneumonia 08/03/2018  . Arthritis of right hip 08/03/2018  . HTN (hypertension) 08/03/2018  . HLD (hyperlipidemia) 08/03/2018  . AKI (acute kidney injury) ([place]) 09/15/2017  . Symptomatic cholelithiasis 10/13/2014    Orientation RESPIRATION BLADDER Height & Weight     Self  Normal External catheter Weight: 165 lb (74.8 kg) Height:  5\' 5"  (165.1 cm)  BEHAVIORAL SYMPTOMS/MOOD NEUROLOGICAL BOWEL NUTRITION STATUS        Diet (regular diet, thin liquids)  AMBULATORY STATUS COMMUNICATION OF NEEDS Skin   Extensive Assist Verbally Surgical wounds (L leg)                       Personal Care Assistance Level  of Assistance  Bathing,Feeding,Dressing Bathing Assistance: Maximum assistance Feeding assistance: Limited assistance Dressing Assistance: Maximum assistance     Functional Limitations Info             SPECIAL CARE FACTORS FREQUENCY  PT (By licensed PT),OT (By licensed OT)     PT Frequency: 5 x/week OT Frequency: 5 x/week            Contractures      Additional Factors Info  Code Status,Allergies Code Status Info: partial code Allergies Info: nka           Current Medications (08/13/2020):  This is the current hospital active medication list Current Facility-Administered Medications  Medication Dose Route Frequency Provider Last Rate Last Admin  . 0.9 %  sodium chloride infusion   Intravenous Continuous Earnestine Leys, MD 75 mL/hr at 08/12/20 1962 Restarted at 08/12/20 1117  . acetaminophen (TYLENOL) tablet 325-650 mg  325-650 mg Oral Q6H PRN Earnestine Leys, MD      . alum & mag hydroxide-simeth (MAALOX/MYLANTA) 200-200-20 MG/5ML suspension 30 mL  30 mL Oral Q4H PRN Earnestine Leys, MD      . apixaban Arne Cleveland) tablet 2.5 mg  2.5 mg Oral Q12H Earnestine Leys, MD   2.5 mg at 08/13/20 2297  . bisacodyl (DULCOLAX) suppository 10 mg  10 mg Rectal Daily PRN Earnestine Leys, MD      . Chlorhexidine Gluconate Cloth 2 % PADS 6 each  6 each Topical Daily Earnestine Leys, MD      .  cholecalciferol (VITAMIN D3) tablet 1,000 Units  1,000 Units Oral Daily Irene Pap N, DO   1,000 Units at 08/13/20 0932  . diltiazem (CARDIZEM CD) 24 hr capsule 120 mg  120 mg Oral Daily Wieting, Richard, MD      . ferrous sulfate tablet 325 mg  325 mg Oral Q breakfast Earnestine Leys, MD   325 mg at 08/13/20 0932  . folic acid (FOLVITE) tablet 1 mg  1 mg Oral Daily Earnestine Leys, MD   1 mg at 08/13/20 0932  . HYDROcodone-acetaminophen (NORCO) 7.5-325 MG per tablet 1-2 tablet  1-2 tablet Oral Q4H PRN Earnestine Leys, MD      . HYDROcodone-acetaminophen (NORCO/VICODIN) 5-325 MG per tablet 1-2 tablet  1-2  tablet Oral Q4H PRN Earnestine Leys, MD      . magnesium hydroxide (MILK OF MAGNESIA) suspension 30 mL  30 mL Oral Daily PRN Earnestine Leys, MD   30 mL at 08/13/20 0932  . menthol-cetylpyridinium (CEPACOL) lozenge 3 mg  1 lozenge Oral PRN Earnestine Leys, MD       Or  . phenol (CHLORASEPTIC) mouth spray 1 spray  1 spray Mouth/Throat PRN Earnestine Leys, MD      . Derrill Memo ON 08/15/2020] methotrexate (RHEUMATREX) tablet 17.5 mg  17.5 mg Oral Q Enedina Finner, MD      . metoCLOPramide (REGLAN) tablet 5-10 mg  5-10 mg Oral Q8H PRN Earnestine Leys, MD       Or  . metoCLOPramide (REGLAN) injection 5-10 mg  5-10 mg Intravenous Q8H PRN Earnestine Leys, MD      . metoprolol tartrate (LOPRESSOR) injection 2.5 mg  2.5 mg Intravenous Q4H PRN Earnestine Leys, MD      . mirtazapine (REMERON) tablet 15 mg  15 mg Oral Tora Duck, MD   15 mg at 08/12/20 2211  . morphine 2 MG/ML injection 0.5-1 mg  0.5-1 mg Intravenous Q2H PRN Earnestine Leys, MD      . multivitamin-lutein [facility]) capsule 1 capsule  1 capsule Oral Daily Earnestine Leys, MD   1 capsule at 08/13/20 0932  . ondansetron (ZOFRAN) injection 4 mg  4 mg Intravenous Q6H PRN Earnestine Leys, MD      . ramipril (ALTACE) capsule 10 mg  10 mg Oral Daily Earnestine Leys, MD      . senna [facility]) tablet 8.6 mg  1 tablet Oral BID Earnestine Leys, MD   8.6 mg at 08/13/20 0932  . simvastatin (ZOCOR) tablet 20 mg  20 mg Oral QHS Earnestine Leys, MD   20 mg at 08/12/20 2211  . sodium phosphate (FLEET) 7-19 GM/118ML enema 1 enema  1 enema Rectal Once PRN Earnestine Leys, MD      . vitamin B-12 (CYANOCOBALAMIN) tablet 1,000 mcg  1,000 mcg Oral Daily Earnestine Leys, MD   1,000 mcg at 08/13/20 0933  . zolpidem (AMBIEN) tablet 5 mg  5 mg Oral QHS PRN Earnestine Leys, MD         Discharge Medications: Please see discharge summary for a list of discharge medications.  Relevant Imaging Results:  Relevant Lab Results:   Additional Information SS #: [id number]  [id number]  [place], LCSW

## 2020-08-13 NOTE — Progress Notes (Signed)
Patient ID: Angela Leonard, female   DOB: Nov 25, 1934, 86 y.o.   MRN: [id number] [facility] PROGRESS NOTE  Cheria Sadiq EYC:[id number] DOB: 1935/03/26 DOA: 08/11/2020 PCP: Derinda Late, MD  HPI/Subjective: Patient offers no complaints.  States that she has no pain in her hip.  Does not recall that she had surgery yesterday.  Admitted after a fall found to have a left femoral neck fracture.  Objective: Vitals:   08/13/20 0820 08/13/20 1203  BP: (!) 90/50 (!) 98/55  Pulse: 60 68  Resp: 16 16  Temp: (!) 97.4 F (36.3 C) 98 F (36.7 C)  SpO2: 95% 94%    Filed Weights   08/11/20 0723  Weight: 74.8 kg    ROS: Review of Systems  Respiratory: Negative for cough and shortness of breath.   Cardiovascular: Negative for chest pain.  Gastrointestinal: Negative for abdominal pain, nausea and vomiting.   Exam: Physical Exam HENT:     Head: Normocephalic.     Mouth/Throat:     Pharynx: No oropharyngeal exudate.  Eyes:     General: Lids are normal.     Conjunctiva/sclera: Conjunctivae normal.  Cardiovascular:     Rate and Rhythm: Normal rate and regular rhythm.     Heart sounds: Normal heart sounds, S1 normal and S2 normal.  Pulmonary:     Breath sounds: Normal breath sounds. No decreased breath sounds, wheezing, rhonchi or rales.  Abdominal:     Palpations: Abdomen is soft.     Tenderness: There is no abdominal tenderness.  Musculoskeletal:     Right lower leg: No swelling.     Left lower leg: No swelling.  Skin:    General: Skin is warm.     Comments: Mass on forehead  Neurological:     Mental Status: She is alert.     Comments: Unable to straight leg raise with her left leg.  Able to straight leg raise with right leg       Data Reviewed: Basic Metabolic Panel: Recent Labs  Lab 08/11/20 0806 08/12/20 0446 08/13/20 0421  NA 136 135 134*  K 4.2 5.0 4.8  CL 102 104 108  CO2 25 23 20*  GLUCOSE 103* 132* 180*  BUN 38* 36* 48*  CREATININE 0.94 1.18* 1.17*   CALCIUM 9.6 8.9 8.0*  MG  --  1.8  --    CBC: Recent Labs  Lab 08/11/20 0806 08/12/20 0446 08/13/20 0421  WBC 13.1* 12.9* 15.2*  NEUTROABS 11.3*  --   --   HGB 16.2* 15.9* 12.5  HCT 48.3* 47.1* 37.7  MCV 103.6* 105.4* 107.1*  PLT 212 194 153     Recent Results (from the past 240 hour(s))  Resp Panel by RT-PCR (Flu A&B, Covid) Nasopharyngeal Swab     Status: None   Collection Time: 08/11/20  8:07 AM   Specimen: Nasopharyngeal Swab; Nasopharyngeal(NP) swabs in vial transport medium  Result Value Ref Range Status   SARS Coronavirus 2 by RT PCR NEGATIVE NEGATIVE Final    Comment: (NOTE) SARS-CoV-2 target nucleic acids are NOT DETECTED.  The SARS-CoV-2 RNA is generally detectable in upper respiratory specimens during the acute phase of infection. The lowest concentration of SARS-CoV-2 viral copies this assay can detect is 138 copies/mL. A negative result does not preclude SARS-Cov-2 infection and should not be used as the sole basis for treatment or other patient management decisions. A negative result may occur with  improper specimen collection/handling, submission of specimen other than nasopharyngeal swab, presence of viral  mutation(s) within the areas targeted by this assay, and inadequate number of viral copies(<138 copies/mL). A negative result must be combined with clinical observations, patient history, and epidemiological information. The expected result is Negative.  Fact Sheet for Patients:  EntrepreneurPulse.com.au  Fact Sheet for Healthcare Providers:  IncredibleEmployment.be  This test is no t yet approved or cleared by the [place] FDA and  has been authorized for detection and/or diagnosis of SARS-CoV-2 by FDA under an Emergency Use Authorization (EUA). This EUA will remain  in effect (meaning this test can be used) for the duration of the COVID-19 declaration under Section 564(b)(1) of the Act, 21 U.S.C.section  360bbb-3(b)(1), unless the authorization is terminated  or revoked sooner.       Influenza A by PCR NEGATIVE NEGATIVE Final   Influenza B by PCR NEGATIVE NEGATIVE Final    Comment: (NOTE) The Xpert Xpress SARS-CoV-2/FLU/RSV plus assay is intended as an aid in the diagnosis of influenza from Nasopharyngeal swab specimens and should not be used as a sole basis for treatment. Nasal washings and aspirates are unacceptable for Xpert Xpress SARS-CoV-2/FLU/RSV testing.  Fact Sheet for Patients: EntrepreneurPulse.com.au  Fact Sheet for Healthcare Providers: IncredibleEmployment.be  This test is not yet approved or cleared by the [place] FDA and has been authorized for detection and/or diagnosis of SARS-CoV-2 by FDA under an Emergency Use Authorization (EUA). This EUA will remain in effect (meaning this test can be used) for the duration of the COVID-19 declaration under Section 564(b)(1) of the Act, 21 U.S.C. section 360bbb-3(b)(1), unless the authorization is terminated or revoked.  Performed at [facility], [address], [place], [place] [address]   Surgical PCR screen     Status: None   Collection Time: 08/12/20  8:34 AM   Specimen: Nasal Mucosa; Nasal Swab  Result Value Ref Range Status   MRSA, PCR NEGATIVE NEGATIVE Final   Staphylococcus aureus NEGATIVE NEGATIVE Final    Comment: (NOTE) The Xpert SA Assay (FDA approved for NASAL specimens in patients 19 years of age and older), is one component of a comprehensive surveillance program. It is not intended to diagnose infection nor to guide or monitor treatment. Performed at [facility], [address] [place], [place] [address]      Studies: [place] HIP OPERATIVE UNILAT W OR W/O PELVIS LEFT  Result Date: 08/12/2020 CLINICAL DATA:  Surgery, elective. Additional history provided: Intraoperative left cannulated hip pinning. EXAM: OPERATIVE left HIP (WITH PELVIS IF  PERFORMED) 2 VIEWS TECHNIQUE: Fluoroscopic spot image(s) were submitted for interpretation post-operatively. COMPARISON:  CT of the left hip 08/11/2020. FINDINGS: Two intraoperative fluoroscopic images of the proximal left femur are submitted. The images demonstrate 3 cannulated screws within the proximal left femur, extending into the femoral head and neck. Hardware traverses a known left femoral neck fracture. Alignment is anatomic. IMPRESSION: Two intraoperative fluoroscopic images of the proximal left femur from cannulated left hip pinning, as described. Electronically Signed   By: Kellie Simmering DO   On: 08/12/2020 15:38    Scheduled Meds: . apixaban  2.5 mg Oral Q12H  . Chlorhexidine Gluconate Cloth  6 each Topical Daily  . cholecalciferol  1,000 Units Oral Daily  . diltiazem  120 mg Oral Daily  . ferrous sulfate  325 mg Oral Q breakfast  . folic acid  1 mg Oral Daily  . [START ON 08/15/2020] methotrexate  17.5 mg Oral Q Tue  . mirtazapine  15 mg Oral QHS  . multivitamin-lutein  1  capsule Oral Daily  . ramipril  10 mg Oral Daily  . senna  1 tablet Oral BID  . simvastatin  20 mg Oral QHS  . vitamin B-12  1,000 mcg Oral Daily   Continuous Infusions: . sodium chloride 75 mL/hr at 08/12/20 1410    Assessment/Plan:  1. Closed left hip fracture requiring operative repair.  Patient had pinning procedure on 08/12/2020 by Dr. Sabra Heck.  Pain control. 2. Relative hypotension.  Discontinued Altace yesterday.  Holding parameters on Cardizem CD 3. Chronic atrial fibrillation on Eliquis for anticoagulation.  Holding parameters on diltiazem.  Currently rate controlled this morning. 4. Large mass on forehead likely a basal cell carcinoma.  Outpatient follow-up for that 5. Dementia on mirtazapine at night 6. Hyperlipidemia unspecified on simvastatin 7. Macrocytosis.  B12 level normal.    Code Status:     Code Status Orders  (From admission, onward)         Start     Ordered   08/11/20 1529   Limited resuscitation (code)  Continuous       Question Answer Comment  In the event of cardiac or respiratory ARREST: Initiate Code Blue, Call Rapid Response Yes   In the event of cardiac or respiratory ARREST: Perform CPR No   In the event of cardiac or respiratory ARREST: Perform Intubation/Mechanical Ventilation Yes   In the event of cardiac or respiratory ARREST: Use NIPPV/BiPAp only if indicated Yes   In the event of cardiac or respiratory ARREST: Administer ACLS medications if indicated Yes   In the event of cardiac or respiratory ARREST: Perform Defibrillation or Cardioversion if indicated Yes      08/11/20 1528        Code Status History    Date Active Date Inactive Code Status Order ID Comments User Context   08/24/2019 1241 08/27/2019 2346 Full Code [id number]  Ivor Costa, MD ED   02/19/2019 1337 02/22/2019 1923 Full Code [id number]  Epifanio Lesches, MD ED   08/04/2018 0031 08/06/2018 1859 Full Code [id number]  Lance Coon, MD Inpatient   09/15/2017 0620 09/17/2017 1431 Full Code [id number]  Harrie Foreman, MD Inpatient   10/13/2014 0556 10/15/2014 2057 Full Code [id number]  Molly Maduro, MD ED   Advance Care Planning Activity     Family Communication: spoke with daughter on the phone Disposition Plan: Status is: Inpatient  Dispo:  Patient From: Home  Planned Disposition: Rehab  Medically stable for discharge: No   Time spent: 27 minutes  [place]

## 2020-08-13 NOTE — Progress Notes (Signed)
NP notified of low BP. LR bolus ordered.

## 2020-08-13 NOTE — TOC Initial Note (Addendum)
Transition of Care [facility]) - Initial/Assessment Note    Patient Details  Name: Angela Leonard MRN: [id number] Date of Birth: [birth year]  Transition of Care [facility]) CM/SW Contact:    Angela Ivan, LCSW Phone Number: 08/13/2020, 1:18 PM  Clinical Narrative:                Per chart review, patient is disoriented. CSW spoke with patient's daughter Angela Leonard via phone for Readmission Risk Screening. Angela Leonard reported patient lives with her. They have a private pay aide who provides care Monday-Friday 9 am-2 pm. Angela Leonard reported these hours can be adjusted/added to if needed. [facility] provides transportation to appointments as Angela Leonard does not currently drive. Patient has a RW and w/c at home. PCP is Dr. Baldemar Lenis. Pharmacy is [facility] on [address]. Patient has had both COVID vaccines, but has not had the booster yet. PT eval pending. Angela Leonard reported they would be agreeable to SNF if this is recommended. TOC will continue to follow.  4:10- PT rec SNF. SNF work up started.   Expected Discharge Plan: Skilled Nursing Facility Barriers to Discharge: Continued Medical Work up   Patient Goals and CMS Choice Patient states their goals for this hospitalization and ongoing recovery are:: SNF rehab CMS Medicare.gov Compare Post Acute Care list provided to:: Patient Represenative (must comment) Choice offered to / list presented to : Adult Children  Expected Discharge Plan and Services Expected Discharge Plan: [place]       Living arrangements for the past 2 months: Single Family Home                                      Prior Living Arrangements/Services Living arrangements for the past 2 months: Single Family Home Lives with:: Adult Children Patient language and need for interpreter reviewed:: Yes Do you feel safe going back to the place where you live?: Yes      Need for Family Participation in Patient Care: Yes (Comment) Care giver support system in place?:  Yes (comment) Current home services: DME,Sitter Criminal Activity/Legal Involvement Pertinent to Current Situation/Hospitalization: No - Comment as needed  Activities of Daily Living Home Assistive Devices/Equipment: Eyeglasses,Walker (specify type),Hearing aid ADL Screening (condition at time of admission) Patient's cognitive ability adequate to safely complete daily activities?: Yes Is the patient deaf or have difficulty hearing?: Yes Does the patient have difficulty seeing, even when wearing glasses/contacts?: No Does the patient have difficulty concentrating, remembering, or making decisions?: No Patient able to express need for assistance with ADLs?: Yes Does the patient have difficulty dressing or bathing?: No Independently performs ADLs?: Yes (appropriate for developmental age) Does the patient have difficulty walking or climbing stairs?: No Weakness of Legs: Left Weakness of Arms/Hands: None  Permission Sought/Granted Permission sought to share information with : Chartered certified accountant granted to share information with : Yes, Verbal Permission Granted (by daughter - patient disoriented)     Permission granted to share info w AGENCY: SNFs        Emotional Assessment       Orientation: : Fluctuating Orientation (Suspected and/or reported Sundowners) Alcohol / Substance Use: Not Applicable Psych Involvement: No (comment)  Admission diagnosis:  Closed fracture of neck of left femur, initial encounter ([place]) [S72.002A] Closed left hip fracture, initial encounter ([place]) [S72.002A] Chronic atrial fibrillation, unspecified ([place]) [I48.20] Patient Active Problem List   Diagnosis Date Noted  . Atrial fibrillation with RVR (  HCC)   . Hypotension   . Mass of skin of head   . Dementia without behavioral disturbance ([place])   . Closed hip fracture requiring operative repair, left, sequela 08/11/2020  . Dehydration 08/25/2019  . UTI (urinary tract infection)  08/24/2019  . Fall at home, initial encounter 08/24/2019  . Hypokalemia 08/24/2019  . RA (rheumatoid arthritis) ([place]) 08/24/2019  . Fall 08/24/2019  . Facial laceration   . Atrial fibrillation, chronic ([place]) 02/19/2019  . Sepsis ([place]) 08/03/2018  . Atypical pneumonia 08/03/2018  . Arthritis of right hip 08/03/2018  . HTN (hypertension) 08/03/2018  . HLD (hyperlipidemia) 08/03/2018  . AKI (acute kidney injury) ([place]) 09/15/2017  . Symptomatic cholelithiasis 10/13/2014   PCP:  Derinda Late, MD Pharmacy:   [facility] [place], [place] [address] [place] [address] Phone: [phone] Fax: [phone]     Social Determinants of Health ([place]) Interventions    Readmission Risk Interventions Readmission Risk Prevention Plan 08/13/2020  Transportation Screening Complete  PCP or Specialist Appt within 3-5 Days Complete  HRI or [place] Complete  Social Work Consult for [place] Planning/Counseling Complete  Palliative Care Screening Not Applicable  Medication Review Press photographer) Complete  Some recent data might be hidden

## 2020-08-13 NOTE — Progress Notes (Signed)
OT Cancellation Note  Patient Details Name: Angela Leonard MRN: [id number] DOB: November 25, 1934   Cancelled Treatment:    Reason Eval/Treat Not Completed: Medical issues which prohibited therapy. Upon chart review this AM, pt noted to have soft BP. Will hold OT evaluation at this time and follow up at later date/time as pt becomes more appropriate for assessment.   Fredirick Maudlin, OTR/L [place]

## 2020-08-14 ENCOUNTER — Encounter: Payer: Self-pay | Admitting: Specialist

## 2020-08-14 ENCOUNTER — Inpatient Hospital Stay
Admit: 2020-08-14 | Discharge: 2020-08-14 | Disposition: A | Discharge status: Home / Self Care | Payer: Medicare Other | Attending: Internal Medicine | Admitting: Internal Medicine

## 2020-08-14 DIAGNOSIS — I482 Chronic atrial fibrillation, unspecified: Secondary | ICD-10-CM

## 2020-08-14 LAB — CBC
HCT: 39.2 % (ref 36.0–46.0)
Hemoglobin: 12.8 g/dL (ref 12.0–15.0)
MCH: 35 pg — ABNORMAL HIGH (ref 26.0–34.0)
MCHC: 32.7 g/dL (ref 30.0–36.0)
MCV: 107.1 fL — ABNORMAL HIGH (ref 80.0–100.0)
Platelets: 150 10*3/uL (ref 150–400)
RBC: 3.66 MIL/uL — ABNORMAL LOW (ref 3.87–5.11)
RDW: 14.9 % (ref 11.5–15.5)
WBC: 17.1 10*3/uL — ABNORMAL HIGH (ref 4.0–10.5)
nRBC: 0 % (ref 0.0–0.2)

## 2020-08-14 LAB — BASIC METABOLIC PANEL
Anion gap: 4 — ABNORMAL LOW (ref 5–15)
BUN: 46 mg/dL — ABNORMAL HIGH (ref 8–23)
CO2: 23 mmol/L (ref 22–32)
Calcium: 8.4 mg/dL — ABNORMAL LOW (ref 8.9–10.3)
Chloride: 107 mmol/L (ref 98–111)
Creatinine, Ser: 0.93 mg/dL (ref 0.44–1.00)
GFR, Estimated: >60 mL/min (ref >60)
Glucose, Bld: 126 mg/dL — ABNORMAL HIGH (ref 70–99)
Potassium: 5.1 mmol/L (ref 3.5–5.1)
Sodium: 134 mmol/L — ABNORMAL LOW (ref 135–145)

## 2020-08-14 LAB — ECHOCARDIOGRAM COMPLETE
AR max vel: 1.37 cm2
AV Area VTI: 1.46 cm2
AV Area mean vel: 1.42 cm2
AV Mean grad: 2.3 mmHg
AV Peak grad: 3.9 mmHg
Ao pk vel: 0.99 m/s
Area-P 1/2: 3.45 cm2
Height: 65 in
MV VTI: 0.72 cm2
S' Lateral: 2.22 cm
Weight: 2640 oz

## 2020-08-14 MED ORDER — SODIUM ZIRCONIUM CYCLOSILICATE 10 G PO PACK
10.0000 g | PACK | Freq: Once | ORAL | Status: AC
  Administered 2020-08-14: 10 g via ORAL
  Filled 2020-08-14: qty 1

## 2020-08-14 NOTE — Plan of Care (Signed)

## 2020-08-14 NOTE — Progress Notes (Signed)
Patient ID: Angela Leonard, female   DOB: 1934/11/29, 86 y.o.   MRN: [id number] [facility] PROGRESS NOTE  Angela Leonard  HPI/Subjective: Patient states she feels perfect.  Offers no complaints.  No pain in her hip.  She stated she took a few steps to get into the chair.  Objective: Vitals:   08/14/20 1139 08/14/20 1601  BP: 94/77 104/64  Pulse: 73 (!) 117  Resp: 14 16  Temp: 98.1 F (36.7 C) 97.8 F (36.6 C)  SpO2: 98% 95%   No intake or output data in the 24 hours ending 08/14/20 1708 Filed Weights   08/11/20 0723  Weight: 74.8 kg    ROS: Review of Systems  Respiratory: Negative for shortness of breath.   Cardiovascular: Negative for chest pain.  Gastrointestinal: Negative for abdominal pain.  Musculoskeletal: Negative for joint pain.   Exam: Physical Exam HENT:     Head: Normocephalic.  Eyes:     General: Lids are normal.     Conjunctiva/sclera: Conjunctivae normal.  Cardiovascular:     Rate and Rhythm: Normal rate. Rhythm irregularly irregular.     Heart sounds: Normal heart sounds, S1 normal and S2 normal.  Pulmonary:     Breath sounds: Normal breath sounds. No decreased breath sounds, wheezing, rhonchi or rales.  Abdominal:     Palpations: Abdomen is soft.     Tenderness: There is no abdominal tenderness.  Musculoskeletal:     Right lower leg: No swelling.     Left lower leg: No swelling.  Skin:    General: Skin is warm.     Findings: No rash.     Comments: Large mass on forehead.  Neurological:     Mental Status: She is alert.       Data Reviewed: Basic Metabolic Panel: Recent Labs  Lab 08/11/20 0806 08/12/20 0446 08/13/20 0421 08/14/20 0428  NA 136 135 134* 134*  K 4.2 5.0 4.8 5.1  CL 102 104 108 107  CO2 25 23 20* 23  GLUCOSE 103* 132* 180* 126*  BUN 38* 36* 48* 46*  CREATININE 0.94 1.18* 1.17* 0.93  CALCIUM 9.6 8.9 8.0* 8.4*  MG  --  1.8  --   --     CBC: Recent Labs  Lab 08/11/20 0806 08/12/20 0446 08/13/20 0421 08/14/20 0428  WBC 13.1* 12.9* 15.2* 17.1*  NEUTROABS 11.3*  --   --   --   HGB 16.2* 15.9* 12.5 12.8  HCT 48.3* 47.1* 37.7 39.2  MCV 103.6* 105.4* 107.1* 107.1*  PLT 212 194 153 150     Recent Results (from the past 240 hour(s))  Resp Panel by RT-PCR (Flu A&B, Covid) Nasopharyngeal Swab     Status: None   Collection Time: 08/11/20  8:07 AM   Specimen: Nasopharyngeal Swab; Nasopharyngeal(NP) swabs in vial transport medium  Result Value Ref Range Status   SARS Coronavirus 2 by RT PCR NEGATIVE NEGATIVE Final    Comment: (NOTE) SARS-CoV-2 target nucleic acids are NOT DETECTED.  The SARS-CoV-2 RNA is generally detectable in upper respiratory specimens during the acute phase of infection. The lowest concentration of SARS-CoV-2 viral copies this assay can detect is 138 copies/mL. A negative result does not preclude SARS-Cov-2 infection and should not be used as the sole basis for treatment or other patient management decisions. A negative result may occur with  improper specimen collection/handling, submission of specimen other than nasopharyngeal swab, presence of viral mutation(s) within the  areas targeted by this assay, and inadequate number of viral copies(<138 copies/mL). A negative result must be combined with clinical observations, patient history, and epidemiological information. The expected result is Negative.  Fact Sheet for Patients:  EntrepreneurPulse.com.au  Fact Sheet for Healthcare Providers:  IncredibleEmployment.be  This test is no t yet approved or cleared by the [place] FDA and  has been authorized for detection and/or diagnosis of SARS-CoV-2 by FDA under an Emergency Use Authorization (EUA). This EUA will remain  in effect (meaning this test can be used) for the duration of the COVID-19 declaration under Section 564(b)(1) of the Act,  21 U.S.C.section 360bbb-3(b)(1), unless the authorization is terminated  or revoked sooner.       Influenza A by PCR NEGATIVE NEGATIVE Final   Influenza B by PCR NEGATIVE NEGATIVE Final    Comment: (NOTE) The Xpert Xpress SARS-CoV-2/FLU/RSV plus assay is intended as an aid in the diagnosis of influenza from Nasopharyngeal swab specimens and should not be used as a sole basis for treatment. Nasal washings and aspirates are unacceptable for Xpert Xpress SARS-CoV-2/FLU/RSV testing.  Fact Sheet for Patients: EntrepreneurPulse.com.au  Fact Sheet for Healthcare Providers: IncredibleEmployment.be  This test is not yet approved or cleared by the [place] FDA and has been authorized for detection and/or diagnosis of SARS-CoV-2 by FDA under an Emergency Use Authorization (EUA). This EUA will remain in effect (meaning this test can be used) for the duration of the COVID-19 declaration under Section 564(b)(1) of the Act, 21 U.S.C. section 360bbb-3(b)(1), unless the authorization is terminated or revoked.  Performed at [facility], [address], [place], [place] [address]   Surgical PCR screen     Status: None   Collection Time: 08/12/20  8:34 AM   Specimen: Nasal Mucosa; Nasal Swab  Result Value Ref Range Status   MRSA, PCR NEGATIVE NEGATIVE Final   Staphylococcus aureus NEGATIVE NEGATIVE Final    Comment: (NOTE) The Xpert SA Assay (FDA approved for NASAL specimens in patients 53 years of age and older), is one component of a comprehensive surveillance program. It is not intended to diagnose infection nor to guide or monitor treatment. Performed at [facility], [address] [place], [place] [address]      Studies: ECHOCARDIOGRAM COMPLETE  Result Date: 08/14/2020    ECHOCARDIOGRAM REPORT   Patient Name:   Angela Leonard Date of Exam: 08/14/2020 Medical Rec #:  [id number]     Height:       65.0 in Accession #:     [id number]    Weight:       165.0 lb Date of Birth:  01-Jan-1935     BSA:          1.823 m Patient Age:    87 years      BP:           127/63 mmHg Patient Gender: F             HR:           77 bpm. Exam Location:  [facility] Procedure: 2D Echo, Cardiac Doppler and Color Doppler Indications:     Atrial Flutter I48.92  History:         Patient has no prior history of Echocardiogram examinations.                  Risk Factors:Hypertension and Dyslipidemia.  Sonographer:     Sherrie Sport RDCS (AE) Referring Phys:  [id number] Loletha Grayer Diagnosing Phys:  Bartholome Bill Leonard IMPRESSIONS  1. Left ventricular ejection fraction, by estimation, is 60 to 65%. The left ventricle has normal function. The left ventricle has no regional wall motion abnormalities. There is mild left ventricular hypertrophy. Left ventricular diastolic parameters are consistent with Grade I diastolic dysfunction (impaired relaxation).  2. Right ventricular systolic function is normal. The right ventricular size is normal.  3. Left atrial size was mildly dilated.  4. The mitral valve is abnormal. Moderate mitral valve regurgitation.  5. The aortic valve is grossly normal. Aortic valve regurgitation is mild. FINDINGS  Left Ventricle: Left ventricular ejection fraction, by estimation, is 60 to 65%. The left ventricle has normal function. The left ventricle has no regional wall motion abnormalities. The left ventricular internal cavity size was normal in size. There is  mild left ventricular hypertrophy. Left ventricular diastolic parameters are consistent with Grade I diastolic dysfunction (impaired relaxation). Right Ventricle: The right ventricular size is normal. No increase in right ventricular wall thickness. Right ventricular systolic function is normal. Left Atrium: Left atrial size was mildly dilated. Right Atrium: Right atrial size was normal in size. Pericardium: There is no evidence of pericardial effusion. Mitral Valve: The mitral valve is abnormal.  Moderate mitral valve regurgitation. MV peak gradient, 20.6 mmHg. The mean mitral valve gradient is 7.0 mmHg. Tricuspid Valve: The tricuspid valve is grossly normal. Tricuspid valve regurgitation is mild. Aortic Valve: The aortic valve is grossly normal. Aortic valve regurgitation is mild. Aortic valve mean gradient measures 2.3 mmHg. Aortic valve peak gradient measures 3.9 mmHg. Aortic valve area, by VTI measures 1.46 cm. Pulmonic Valve: The pulmonic valve was not well visualized. Pulmonic valve regurgitation is trivial. Aorta: The aortic root is normal in size and structure. IAS/Shunts: The atrial septum is grossly normal.  LEFT VENTRICLE PLAX 2D LVIDd:         3.49 cm LVIDs:         2.22 cm LV PW:         1.06 cm LV IVS:        0.95 cm LVOT diam:     2.00 cm LV SV:         31 LV SV Index:   17 LVOT Area:     3.14 cm  RIGHT VENTRICLE RV Basal diam:  2.70 cm RV S prime:     9.46 cm/s TAPSE (M-mode): 3.4 cm LEFT ATRIUM             Index       RIGHT ATRIUM           Index LA diam:        3.40 cm 1.87 cm/m  RA Area:     17.50 cm LA Vol (A2C):   90.2 ml 49.48 ml/m RA Volume:   43.40 ml  23.81 ml/m LA Vol (A4C):   56.6 ml 31.05 ml/m LA Biplane Vol: 71.1 ml 39.00 ml/m  AORTIC VALVE                   PULMONIC VALVE AV Area (Vmax):    1.37 cm    PV Vmax:        0.45 m/s AV Area (Vmean):   1.42 cm    PV Peak grad:   0.8 mmHg AV Area (VTI):     1.46 cm    RVOT Peak grad: 2 mmHg AV Vmax:           98.90 cm/s AV Vmean:  71.033 cm/s AV VTI:            0.211 m AV Peak Grad:      3.9 mmHg AV Mean Grad:      2.3 mmHg LVOT Vmax:         43.10 cm/s LVOT Vmean:        32.100 cm/s LVOT VTI:          0.098 m LVOT/AV VTI ratio: 0.46  AORTA Ao Root diam: 2.93 cm MITRAL VALVE                TRICUSPID VALVE MV Area (PHT): 3.45 cm     TR Peak grad:   27.5 mmHg MV Area VTI:   0.72 cm     TR Vmax:        262.00 cm/s MV Peak grad:  20.6 mmHg MV Mean grad:  7.0 mmHg     SHUNTS MV Vmax:       2.27 m/s     Systemic VTI:  0.10  m MV Vmean:      118.0 cm/s   Systemic Diam: 2.00 cm MV Decel Time: 220 msec MV E velocity: 151.00 cm/s Bartholome Bill Leonard Electronically signed by Bartholome Bill Leonard Signature Date/Time: 08/14/2020/12:06:27 PM    Final     Scheduled Meds: . apixaban  2.5 mg Oral Q12H  . Chlorhexidine Gluconate Cloth  6 each Topical Daily  . diltiazem  120 mg Oral Daily  . ferrous sulfate  325 mg Oral Q breakfast  . folic acid  1 mg Oral Daily  . [START ON 08/15/2020] methotrexate  17.5 mg Oral Q Tue  . mirtazapine  15 mg Oral QHS  . multivitamin-lutein  1 capsule Oral Daily  . ramipril  10 mg Oral Daily  . senna  1 tablet Oral BID  . simvastatin  20 mg Oral QHS  . vitamin B-12  1,000 mcg Oral Daily    Assessment/Plan:  1. Left hip fracture requiring operative repair.  Patient had pinning procedure on 08/12/2020 by Dr. Sabra Heck.  Patient not having any pain.  Hopefully can use Tylenol if she does have pain.  Likely will go out to rehab tomorrow.  Send off 6/24-hour Covid swab. 2. Chronic atrial fibrillation on Eliquis for anticoagulation.  Holding parameters on diltiazem.  Was rate controlled this morning. 3. Relative hypotension.  Altace discontinued.  Likely will still end up needing Cardizem 4. Large mass on forehead likely a basal cell carcinoma. 5. Dementia without behavioral disturbance on mirtazapine at night 6. Hyperlipidemia unspecified on simvastatin 7. Macrocytosis     Code Status:     Code Status Orders  (From admission, onward)         Start     Ordered   08/11/20 1529  Limited resuscitation (code)  Continuous       Question Answer Comment  In the event of cardiac or respiratory ARREST: Initiate Code Blue, Call Rapid Response Yes   In the event of cardiac or respiratory ARREST: Perform CPR No   In the event of cardiac or respiratory ARREST: Perform Intubation/Mechanical Ventilation Yes   In the event of cardiac or respiratory ARREST: Use NIPPV/BiPAp only if indicated Yes   In the event  of cardiac or respiratory ARREST: Administer ACLS medications if indicated Yes   In the event of cardiac or respiratory ARREST: Perform Defibrillation or Cardioversion if indicated Yes      08/11/20 1528        Code  Status History    Date Active Date Inactive Code Status Order ID Comments User Context   08/24/2019 1241 08/27/2019 2346 Full Code [id number]  Ivor Costa, [facility] ED   02/19/2019 1337 02/22/2019 1923 Full Code [id number]  Epifanio Lesches, [facility] ED   08/04/2018 0031 08/06/2018 1859 Full Code [id number]  Lance Coon, [facility] Inpatient   09/15/2017 0620 09/17/2017 1431 Full Code [id number]  Harrie Foreman, [facility] Inpatient   10/13/2014 0556 10/15/2014 2057 Full Code [id number]  Molly Maduro, [facility] ED   Advance Care Planning Activity     Disposition Plan: Status is: Inpatient  Dispo:  Patient From: Home  Planned Disposition: [facility] tomorrow  Medically stable for discharge: No   Time spent: 27 minutes  [place]

## 2020-08-14 NOTE — Progress Notes (Signed)
   08/14/20 1601  Assess: MEWS Score  Temp 97.8 F (36.6 C)  BP 104/64  Pulse Rate (!) 117  Resp 16  SpO2 95 %  O2 Device Room Air  Assess: MEWS Score  MEWS Temp 0  MEWS Systolic 0  MEWS Pulse 2  MEWS RR 0  MEWS LOC 0  MEWS Score 2  MEWS Score Color Yellow  Assess: if the MEWS score is Yellow or Red  Were vital signs taken at a resting state? Yes  Focused Assessment No change from prior assessment  Early Detection of Sepsis Score *See Row Information* Low  MEWS guidelines implemented *See Row Information* No, previously yellow, continue vital signs every 4 hours  Treat  Pain Scale 0-10  Pain Score 0

## 2020-08-14 NOTE — Progress Notes (Signed)
Subjective: 2 Days Post-Op Procedure(s) (LRB): CANNULATED HIP PINNING (Left) Patient doing well.  She is comfortable.  She has been out of bed to chair and back.  Hemoglobin is stable.  Dressing is dry.  Range of motion of the hip is good.  He is waiting on skilled nursing.  She will be very slow with PT due to her dementia.  She needs to remain well touchdown weightbearing at the most.  Patient reports pain as mild.  Objective:   VITALS:   Vitals:   08/14/20 0805 08/14/20 1139  BP: 127/63 94/77  Pulse: 77 73  Resp: 16 14  Temp: 98 F (36.7 C) 98.1 F (36.7 C)  SpO2: 97% 98%    Neurologically intact Sensation intact distally Incision: dressing C/D/I  LABS Recent Labs    08/12/20 0446 08/13/20 0421 08/14/20 0428  HGB 15.9* 12.5 12.8  HCT 47.1* 37.7 39.2  WBC 12.9* 15.2* 17.1*  PLT 194 153 150    Recent Labs    08/12/20 0446 08/13/20 0421 08/14/20 0428  NA 135 134* 134*  K 5.0 4.8 5.1  BUN 36* 48* 46*  CREATININE 1.18* 1.17* 0.93  GLUCOSE 132* 180* 126*    Recent Labs    08/12/20 0446  INR 1.3*     Assessment/Plan: 2 Days Post-Op Procedure(s) (LRB): CANNULATED HIP PINNING (Left)   Up with therapy Discharge to SNF   Low-dose Eliquis has been restarted.  Remain toe-touch weightbearing at the most on the left.  Return to clinic 2 weeks for exam and x-ray

## 2020-08-14 NOTE — Progress Notes (Signed)
Physical Therapy Treatment Patient Details Name: Angela Leonard MRN: [id number] DOB: December 29, 1934 Today's Date: 08/14/2020    History of Present Illness 86 y/o female with recent fall and subsequent L hip fracture, underwent L hip pinning on 08/12/20. PMH: dementia, a fib on eliquis, essential HTN, hyperlidemia, RA who presented to the ED after mechanical fall at home. She is s/p L hip pinning on 08/12/20.    PT Comments    Pt asleep, easily wakes, pleasant throughout. Able to follow commands with cueing. Supine to sit with minA, and returned to supine at end of session modA. Significant improvement in her ability to reposition in bed with step by step cueing, minA to slide up in bed at end of session. Sit <> stand from EOB with RW and minA performed twice. pt pulled on RW despite encouragement. PT foot under pt's left foot to ensure TTWB status; able to maintain with constant multimodal cueing but with fatigue increased LLE weight bearing. Further mobility attempts deferred. Several supine LE exercises performed with visual and tactile cueing, AAROM as needed for LLE. Pt in bed with all needs in reach at end of session. The patient would benefit from further skilled PT intervention to continue to progress towards goals. Recommendation remains appropriate.       Follow Up Recommendations  SNF     Equipment Recommendations  Rolling walker with 5" wheels    Recommendations for Other Services       Precautions / Restrictions Precautions Precautions: Other (comment) Precaution Comments: TDWB precautions per Dr. Sabra Heck Restrictions Weight Bearing Restrictions: Yes LLE Weight Bearing: Touchdown weight bearing Other Position/Activity Restrictions: TTWB per Dr. Sabra Heck secure chat 08/13/20    Mobility  Bed Mobility Overal bed mobility: Needs Assistance Bed Mobility: Supine to Sit;Sit to Supine     Supine to sit: Min assist Sit to supine: Mod assist        Transfers Overall transfer  level: Needs assistance Equipment used: Rolling walker (2 wheeled) Transfers: Sit to/from [facility] Sit to Stand: Min assist         General transfer comment: pt pulled on RW despite encouragement. PT foot under pt's left foot to ensure TTWB status; able to maintain with constant multimodal cueing but with fatigue increased LLE weight bearing. Further mobility attempts deferred.  Ambulation/Gait             General Gait Details: deferred   Stairs             Wheelchair Mobility    Modified Rankin (Stroke Patients Only)       Balance Overall balance assessment: Needs assistance Sitting-balance support: Single extremity supported;Feet supported Sitting balance-[name] Scale: Fair     Standing balance support: Bilateral upper extremity supported Standing balance-[name] Scale: Poor                              Cognition Arousal/Alertness: Awake/alert Behavior During Therapy: WFL for tasks assessed/performed Overall Cognitive Status: Within Functional Limits for tasks assessed                                 General Comments: Pt was A&O to self only, some mild confusion noted during interventions she is a questionable historian, but she was able to follow verbal directions appropriately      Exercises Total Joint Exercises Ankle Circles/Pumps: AROM;Both;10 reps Short Arc Quad: AROM;Strengthening;Left;10  reps Straight Leg Raises: AROM;Strengthening;Right;10 reps;AAROM;Left Other Exercises Other Exercises: sit <> stand at EOB twice, minA with RW to maintain weight bearing precautions. Pt able to maintain with constant cueing but with fatigue did exhibit increased weight bearing in LLE, further mobility deferred    General Comments        Pertinent Vitals/Pain Pain Assessment: Faces Faces Pain Scale: Hurts a little bit Pain Location: L hip Pain Descriptors / Indicators: Aching;Dull Pain Intervention(s):  Repositioned;Limited activity within patient's tolerance;Monitored during session    Home Living                      Prior Function            PT Goals (current goals can now be found in the care plan section) Progress towards PT goals: Progressing toward goals    Frequency    BID      PT Plan Current plan remains appropriate    Co-evaluation              AM-PAC PT "6 Clicks" Mobility   Outcome Measure  Help needed turning from your back to your side while in a flat bed without using bedrails?: A Little Help needed moving from lying on your back to sitting on the side of a flat bed without using bedrails?: A Lot Help needed moving to and from a bed to a chair (including a wheelchair)?: A Lot Help needed standing up from a chair using your arms (e.g., wheelchair or bedside chair)?: A Lot Help needed to walk in hospital room?: A Lot Help needed climbing 3-5 steps with a railing? : Total 6 Click Score: 12    End of Session Equipment Utilized During Treatment: Gait belt Activity Tolerance: Patient tolerated treatment well Patient left: in chair;with call Hohn/phone within reach;with chair alarm set Nurse Communication: Mobility status PT Visit Diagnosis: Unsteadiness on feet (R26.81);Repeated falls (R29.6);Muscle weakness (generalized) (M62.81)     Time: 9480-1655 PT Time Calculation (min) (ACUTE ONLY): 17 min  Charges:  $Therapeutic Exercise: 8-22 mins                     Lieutenant Diego PT, DPT 2:34 PM,08/14/20

## 2020-08-14 NOTE — Progress Notes (Signed)
*  PRELIMINARY RESULTS* Echocardiogram 2D Echocardiogram has been performed.  Angela Leonard 08/14/2020, 9:16 AM

## 2020-08-14 NOTE — Progress Notes (Signed)
Physical Therapy Treatment Patient Details Name: Angela Leonard MRN: [id number] DOB: 1934/08/16 Today's Date: 08/14/2020    History of Present Illness 86 y/o female with recent fall and subsequent L hip fracture, underwent L hip pinning on 08/12/20. PMH: dementia, a fib on eliquis, essential HTN, hyperlidemia, RA who presented to the ED after mechanical fall at home. She is s/p L hip pinning on 08/12/20.    PT Comments    Patient alert, oriented to self only. Denied pain, but exhibited mod pain signs/symptoms with mobility. Pt able to perform some supine exercises AAROM on LLE. modA to sit EOB, and min-modA for sit <> stand transfers this session. Pt able to report back TTWB precautions after initial education at start of session. PT foot under LLE to ensure and assess ability to maintain precautions, multimodal cueing and fair ability to maintain. Pt up in chair with RN and CNA in room at end of session, all needs in reach. The patient would benefit from further skilled PT intervention to continue to progress towards goals. Recommendation remains appropriate.     Follow Up Recommendations  SNF     Equipment Recommendations  Rolling walker with 5" wheels    Recommendations for Other Services       Precautions / Restrictions Precautions Precautions: Other (comment) Precaution Comments: TDWB precautions per Dr. Sabra Heck Restrictions Weight Bearing Restrictions: Yes LLE Weight Bearing: Touchdown weight bearing Other Position/Activity Restrictions: TTWB per Dr. Sabra Heck secure chat 08/13/20    Mobility  Bed Mobility Overal bed mobility: Needs Assistance Bed Mobility: Supine to Sit     Supine to sit: Mod assist     General bed mobility comments: pt required modA for LLE managment    Transfers Overall transfer level: Needs assistance Equipment used: Rolling walker (2 wheeled) Transfers: Sit to/from [facility] Sit to Stand: Min assist;Mod assist Stand pivot  transfers: Max assist       General transfer comment: minA to stand from EOB, modA from recliner. PT foot under pt's foot to assess ability to maintain precautions, multimodal cues. MaxA to stand pivot to assist with off weighting LLE  Ambulation/Gait             General Gait Details: deferred due to pt fatigue/pain/difficulty maintaining TTWB precautions   Stairs             Wheelchair Mobility    Modified Rankin (Stroke Patients Only)       Balance Overall balance assessment: Needs assistance Sitting-balance support: Single extremity supported;Feet supported Sitting balance-[name] Scale: Fair Sitting balance - Comments: occasional Posterior lean noted                                    Cognition Arousal/Alertness: Awake/alert Behavior During Therapy: WFL for tasks assessed/performed                                   General Comments: Pt was A&O to self only, some mild confusion noted during interventions she is a questionable historian, but she was able to follow verbal directions appropriately      Exercises Total Joint Exercises Ankle Circles/Pumps: AROM;Both;10 reps Heel Slides: AAROM;Strengthening;Left;10 reps;AROM;Right Hip ABduction/ADduction: AAROM;Strengthening;Left;10 reps;AROM;Right    General Comments        Pertinent Vitals/Pain Pain Assessment: Faces Faces Pain Scale: Hurts little more Pain Location: L hip Pain  Descriptors / Indicators: Aching;Dull Pain Intervention(s): Monitored during session;Repositioned;Limited activity within patient's tolerance    Home Living                      Prior Function            PT Goals (current goals can now be found in the care plan section) Progress towards PT goals: Progressing toward goals    Frequency    BID      PT Plan Current plan remains appropriate    Co-evaluation              AM-PAC PT "6 Clicks" Mobility   Outcome Measure   Help needed turning from your back to your side while in a flat bed without using bedrails?: A Little Help needed moving from lying on your back to sitting on the side of a flat bed without using bedrails?: A Lot Help needed moving to and from a bed to a chair (including a wheelchair)?: A Lot Help needed standing up from a chair using your arms (e.g., wheelchair or bedside chair)?: A Lot Help needed to walk in hospital room?: A Lot Help needed climbing 3-5 steps with a railing? : Total 6 Click Score: 12    End of Session Equipment Utilized During Treatment: Gait belt Activity Tolerance: Patient tolerated treatment well Patient left: in chair;with call Bow/phone within reach;with chair alarm set Nurse Communication: Mobility status PT Visit Diagnosis: Unsteadiness on feet (R26.81);Repeated falls (R29.6);Muscle weakness (generalized) (M62.81)     Time: 3664-4034 PT Time Calculation (min) (ACUTE ONLY): 25 min  Charges:  $Therapeutic Exercise: 23-37 mins                     Lieutenant Diego PT, DPT 10:20 AM,08/14/20

## 2020-08-14 NOTE — TOC Progression Note (Addendum)
Transition of Care [facility]) - Progression Note    Patient Details  Name: Angela Leonard MRN: [id number] Date of Birth: 07-15-1934  Transition of [facility]) CM/SW [place], LCSW Phone Number: 08/14/2020, 11:05 AM  Clinical Narrative: Called daughter and reviewed bed offers: [place], and [place]. She would like to see if anyone else offers before deciding.    3:13 pm: CSW called daughter and let her know that only [facility] had offered since our conversation this morning. Reviewed CMS scores for facilities that have offered and that are pending. Daughter asked that [facility] review referral. Left message for admissions coordinator with request. Patient has had two COVID vaccines but no booster. Notified her that patient will likely have to quarantine at SNF.  Expected Discharge Plan: Skilled Nursing Facility Barriers to Discharge: Continued Medical Work up  Expected Discharge Plan and Services Expected Discharge Plan: [place] arrangements for the past 2 months: Single Family Home                                       Social Determinants of Health (SDOH) Interventions    Readmission Risk Interventions Readmission Risk Prevention Plan 08/13/2020  Transportation Screening Complete  PCP or Specialist Appt within 3-5 Days Complete  HRI or [place] Complete  Social Work Consult for [place] Planning/Counseling Complete  Palliative Care Screening Not Applicable  Medication Review Press photographer) Complete  Some recent data might be hidden

## 2020-08-14 NOTE — Care Management Important Message (Signed)
Important Message  Patient Details  Name: Karena Kinker MRN: [id number] Date of Birth: 08/12/1934   Medicare Important Message Given:  N/A - LOS <3 / Initial given by admissions     Juliann Pulse A Rylen Swindler 08/14/2020, 8:10 AM

## 2020-08-15 LAB — SARS CORONAVIRUS 2 (TAT 6-24 HRS): SARS Coronavirus 2: NEGATIVE

## 2020-08-15 LAB — CBC
HCT: 39.2 % (ref 36.0–46.0)
Hemoglobin: 12.9 g/dL (ref 12.0–15.0)
MCH: 35 pg — ABNORMAL HIGH (ref 26.0–34.0)
MCHC: 32.9 g/dL (ref 30.0–36.0)
MCV: 106.2 fL — ABNORMAL HIGH (ref 80.0–100.0)
Platelets: 162 10*3/uL (ref 150–400)
RBC: 3.69 MIL/uL — ABNORMAL LOW (ref 3.87–5.11)
RDW: 15 % (ref 11.5–15.5)
WBC: 11.5 10*3/uL — ABNORMAL HIGH (ref 4.0–10.5)
nRBC: 0 % (ref 0.0–0.2)

## 2020-08-15 MED ORDER — DILTIAZEM HCL ER COATED BEADS 120 MG PO CP24: 120.0000 mg | ORAL_CAPSULE | Freq: Every day | ORAL | 0 refills | Status: DC

## 2020-08-15 MED ORDER — APIXABAN 2.5 MG PO TABS: 2.5000 mg | ORAL_TABLET | Freq: Two times a day (BID) | ORAL | 0 refills | Status: DC

## 2020-08-15 MED ORDER — ACETAMINOPHEN 325 MG PO TABS: 650.0000 mg | ORAL_TABLET | Freq: Four times a day (QID) | ORAL | Status: AC | PRN

## 2020-08-15 MED ORDER — FLEET ENEMA 7-19 GM/118ML RE ENEM: 1.0000 | ENEMA | Freq: Once | RECTAL | Status: DC

## 2020-08-15 MED ORDER — HYDROCODONE-ACETAMINOPHEN 7.5-325 MG PO TABS: 1.0000 | ORAL_TABLET | Freq: Four times a day (QID) | ORAL | 0 refills | Status: DC | PRN

## 2020-08-15 MED ORDER — FERROUS SULFATE 325 (65 FE) MG PO TABS: 325.0000 mg | ORAL_TABLET | Freq: Every day | ORAL | 0 refills | Status: DC

## 2020-08-15 MED ORDER — MENTHOL 3 MG MT LOZG: 1.0000 | LOZENGE | OROMUCOSAL | 0 refills | Status: DC | PRN

## 2020-08-15 MED ORDER — TRAZODONE HCL 50 MG PO TABS: 25.0000 mg | ORAL_TABLET | Freq: Every evening | ORAL | 0 refills | Status: AC | PRN

## 2020-08-15 MED ORDER — OCUVITE-LUTEIN PO CAPS: 1.0000 | ORAL_CAPSULE | Freq: Two times a day (BID) | ORAL | 0 refills | Status: AC

## 2020-08-15 NOTE — Progress Notes (Signed)
PT Cancellation Note  Patient Details Name: Angela Leonard MRN: [id number] DOB: 01-21-1935   Cancelled Treatment:    Reason Eval/Treat Not Completed: Other (comment). Pt eating breakfast at this time, PT to follow up as able.   Lieutenant Diego PT, DPT 8:53 AM,08/15/20

## 2020-08-15 NOTE — TOC Progression Note (Signed)
Transition of Care [facility]) - Progression Note    Patient Details  Name: Angela Leonard MRN: [id number] Date of Birth: [birth year]  [facility] CM/SW Contact  Candie Chroman, LCSW Phone Number: 08/15/2020, 9:58 AM  Clinical Narrative:  [facility] admissions coordinator is reviewing referral. Notified her of plan for discharge today.  Expected Discharge Plan: Skilled Nursing Facility Barriers to Discharge: Continued Medical Work up  Expected Discharge Plan and Services Expected Discharge Plan: [place] arrangements for the past 2 months: Single Family Home Expected Discharge Date: 08/15/20                                     Social Determinants of Health (SDOH) Interventions    Readmission Risk Interventions Readmission Risk Prevention Plan 08/13/2020  Transportation Screening Complete  PCP or Specialist Appt within 3-5 Days Complete  HRI or [place] Complete  Social Work Consult for [place] Planning/Counseling Complete  Palliative Care Screening Not Applicable  Medication Review Press photographer) Complete  Some recent data might be hidden

## 2020-08-15 NOTE — Progress Notes (Signed)
Subjective:  POD #3 s/p percutaneous screw fixation of left femoral neck hip fracture..   Patient has dementia, but reports left pain as mild.    Objective:   VITALS:   Vitals:   08/14/20 1739 08/14/20 1953 08/15/20 0020 08/15/20 0842  BP: 93/70 117/72 (!) 134/94 132/75  Pulse: 81 76 78 84  Resp: 15 17 14 15   Temp: 98.1 F (36.7 C) 97.7 F (36.5 C) (!) 97 F (36.1 C) (!) 97.5 F (36.4 C)  TempSrc:      SpO2: 95% 95% 97% 97%  Weight:      Height:        PHYSICAL EXAM: Left lower extremity Neurovascular intact Sensation intact distally Intact pulses distally Dorsiflexion/Plantar flexion intact Incision: dressing C/D/I No cellulitis present Compartment soft  LABS  Results for orders placed or performed during the hospital encounter of 08/11/20 (from the past 24 hour(s))  SARS CORONAVIRUS 2 (TAT 6-24 HRS) Nasopharyngeal Nasopharyngeal Swab     Status: None   Collection Time: 08/14/20  5:46 PM   Specimen: Nasopharyngeal Swab  Result Value Ref Range   SARS Coronavirus 2 NEGATIVE NEGATIVE  CBC     Status: Abnormal   Collection Time: 08/15/20  6:05 AM  Result Value Ref Range   WBC 11.5 (H) 4.0 - 10.5 K/uL   RBC 3.69 (L) 3.87 - 5.11 MIL/uL   Hemoglobin 12.9 12.0 - 15.0 g/dL   HCT 39.2 36.0 - 46.0 %   MCV 106.2 (H) 80.0 - 100.0 fL   MCH 35.0 (H) 26.0 - 34.0 pg   MCHC 32.9 30.0 - 36.0 g/dL   RDW 15.0 11.5 - 15.5 %   Platelets 162 150 - 400 K/uL   nRBC 0.0 0.0 - 0.2 %    ECHOCARDIOGRAM COMPLETE  Result Date: 08/14/2020    ECHOCARDIOGRAM REPORT   Patient Name:   Angela Leonard Date of Exam: 08/14/2020 Medical Rec #:  [id number]     Height:       65.0 in Accession #:    [id number]    Weight:       165.0 lb Date of Birth:  16-Mar-1935     BSA:          1.823 m Patient Age:    86 years      BP:           127/63 mmHg Patient Gender: F             HR:           77 bpm. Exam Location:  [facility] Procedure: 2D Echo, Cardiac Doppler and Color Doppler Indications:     Atrial Flutter  I48.92  History:         Patient has no prior history of Echocardiogram examinations.                  Risk Factors:Hypertension and Dyslipidemia.  Sonographer:     Sherrie Sport RDCS (AE) Referring Phys:  [id number] Loletha Grayer Diagnosing Phys: Bartholome Bill MD IMPRESSIONS  1. Left ventricular ejection fraction, by estimation, is 60 to 65%. The left ventricle has normal function. The left ventricle has no regional wall motion abnormalities. There is mild left ventricular hypertrophy. Left ventricular diastolic parameters are consistent with Grade I diastolic dysfunction (impaired relaxation).  2. Right ventricular systolic function is normal. The right ventricular size is normal.  3. Left atrial size was mildly dilated.  4. The mitral valve is abnormal. Moderate mitral valve regurgitation.  5.  The aortic valve is grossly normal. Aortic valve regurgitation is mild. FINDINGS  Left Ventricle: Left ventricular ejection fraction, by estimation, is 60 to 65%. The left ventricle has normal function. The left ventricle has no regional wall motion abnormalities. The left ventricular internal cavity size was normal in size. There is  mild left ventricular hypertrophy. Left ventricular diastolic parameters are consistent with Grade I diastolic dysfunction (impaired relaxation). Right Ventricle: The right ventricular size is normal. No increase in right ventricular wall thickness. Right ventricular systolic function is normal. Left Atrium: Left atrial size was mildly dilated. Right Atrium: Right atrial size was normal in size. Pericardium: There is no evidence of pericardial effusion. Mitral Valve: The mitral valve is abnormal. Moderate mitral valve regurgitation. MV peak gradient, 20.6 mmHg. The mean mitral valve gradient is 7.0 mmHg. Tricuspid Valve: The tricuspid valve is grossly normal. Tricuspid valve regurgitation is mild. Aortic Valve: The aortic valve is grossly normal. Aortic valve regurgitation is mild. Aortic valve mean  gradient measures 2.3 mmHg. Aortic valve peak gradient measures 3.9 mmHg. Aortic valve area, by VTI measures 1.46 cm. Pulmonic Valve: The pulmonic valve was not well visualized. Pulmonic valve regurgitation is trivial. Aorta: The aortic root is normal in size and structure. IAS/Shunts: The atrial septum is grossly normal.  LEFT VENTRICLE PLAX 2D LVIDd:         3.49 cm LVIDs:         2.22 cm LV PW:         1.06 cm LV IVS:        0.95 cm LVOT diam:     2.00 cm LV SV:         31 LV SV Index:   17 LVOT Area:     3.14 cm  RIGHT VENTRICLE RV Basal diam:  2.70 cm RV S prime:     9.46 cm/s TAPSE (M-mode): 3.4 cm LEFT ATRIUM             Index       RIGHT ATRIUM           Index LA diam:        3.40 cm 1.87 cm/m  RA Area:     17.50 cm LA Vol (A2C):   90.2 ml 49.48 ml/m RA Volume:   43.40 ml  23.81 ml/m LA Vol (A4C):   56.6 ml 31.05 ml/m LA Biplane Vol: 71.1 ml 39.00 ml/m  AORTIC VALVE                   PULMONIC VALVE AV Area (Vmax):    1.37 cm    PV Vmax:        0.45 m/s AV Area (Vmean):   1.42 cm    PV Peak grad:   0.8 mmHg AV Area (VTI):     1.46 cm    RVOT Peak grad: 2 mmHg AV Vmax:           98.90 cm/s AV Vmean:          71.033 cm/s AV VTI:            0.211 m AV Peak Grad:      3.9 mmHg AV Mean Grad:      2.3 mmHg LVOT Vmax:         43.10 cm/s LVOT Vmean:        32.100 cm/s LVOT VTI:          0.098 m LVOT/AV VTI ratio: 0.46  AORTA Ao Root  diam: 2.93 cm MITRAL VALVE                TRICUSPID VALVE MV Area (PHT): 3.45 cm     TR Peak grad:   27.5 mmHg MV Area VTI:   0.72 cm     TR Vmax:        262.00 cm/s MV Peak grad:  20.6 mmHg MV Mean grad:  7.0 mmHg     SHUNTS MV Vmax:       2.27 m/s     Systemic VTI:  0.10 m MV Vmean:      118.0 cm/s   Systemic Diam: 2.00 cm MV Decel Time: 220 msec MV E velocity: 151.00 cm/s Bartholome Bill MD Electronically signed by Bartholome Bill MD Signature Date/Time: 08/14/2020/12:06:27 PM    Final     Assessment/Plan: 3 Days Post-Op   Active Problems:   Chronic atrial fibrillation,  unspecified ([place])   Closed hip fracture requiring operative repair, left, sequela   Atrial fibrillation with RVR ([place])   Hypotension   Mass of skin of head   Dementia without behavioral disturbance ([place])   Patient is stable from an orthopedic standpoint.  She is being discharged to a skilled nursing facility today per Dr. Leslye Peer.  Patient is touchdown weightbearing on the left lower extremity until follow-up with Dr. Sabra Heck in 10 to 14 days.  Patient on low-dose Eliquis.   [place]orn[place] , MD 08/15/2020, 11:49 AM

## 2020-08-15 NOTE — Discharge Summary (Addendum)
Angela Leonard at [place] NAME: Angela Leonard    MR#:  [id number]  DATE OF BIRTH:  Oct 19, 1934  DATE OF ADMISSION:  08/11/2020 ADMITTING PHYSICIAN: Kayleen Memos, DO  DATE OF DISCHARGE: 08/15/2020  PRIMARY CARE PHYSICIAN: Derinda Late, MD    ADMISSION DIAGNOSIS:  Closed fracture of neck of left femur, initial encounter ([place]) [S72.002A] Closed left hip fracture, initial encounter ([place]) [S72.002A] Chronic atrial fibrillation, unspecified ([place]) [I48.20]  DISCHARGE DIAGNOSIS:  Active Problems:   Chronic atrial fibrillation, unspecified ([place])   Closed hip fracture requiring operative repair, left, sequela   Atrial fibrillation with RVR (H[facility])   Hypotension   Mass of skin of head   Dementia without behavioral disturbance ([place])   SECONDARY DIAGNOSIS:   Past Medical History:  Diagnosis Date  . HLD (hyperlipidemia)   . Hypertension     HOSPITAL COURSE:   1.  Left hip fracture requiring operative repair.  Patient had pinning procedure on 08/12/2020 by Dr. Sabra Heck.  Patient states that she does not have any pain.  Hopefully can switch over to Tylenol as quickly as possible.  Stable to go out to rehab. 2.  Chronic atrial fibrillation.  Continue Eliquis for anticoagulation.  Continue diltiazem for rate control.  Echocardiogram showed a normal EF 3.  Relative hypotension.  Altace discontinued.  Continue Cardizem.  If blood pressure starts to rise can consider restarting lower dose Altace. 4.  Large mass on forehead.  Likely a basal cell carcinoma.  With her age and history of dementia we will leave it up to the patient's daughter on whether they want this further evaluated. 5.  Dementia without behavioral disturbance.  On Remeron at night.  We will also give as needed trazodone if he does not sleep with the Remeron. 6.  Hyperlipidemia unspecified.  Will discontinue simvastatin 7.  Macrocytosis.  Vitamin B12 level normal. 8.  Impaired fasting  glucose.  Follow-up as outpatient   Patient is a partial code with no chest compressions but okay to do defibrillation pushing medications and intubation.  DISCHARGE CONDITIONS:   Satisfactory  CONSULTS OBTAINED:  Treatment Team:  Earnestine Leys, MD  DRUG ALLERGIES:  No Known Allergies  DISCHARGE MEDICATIONS:   Allergies as of 08/15/2020   No Known Allergies     Medication List    STOP taking these medications   diltiazem 180 MG 24 hr capsule Commonly known as: DILACOR XR   multivitamin with minerals Tabs tablet   ramipril 10 MG capsule Commonly known as: ALTACE     TAKE these medications   acetaminophen 325 MG tablet Commonly known as: TYLENOL Take 2 tablets (650 mg total) by mouth every 6 (six) hours as needed for mild pain or moderate pain (pain score 1-3 or temp > 100.5). What changed:   when to take this  reasons to take this   apixaban 2.5 MG Tabs tablet Commonly known as: ELIQUIS Take 1 tablet (2.5 mg total) by mouth every 12 (twelve) hours. What changed:   medication strength  how much to take   cholecalciferol 1000 units tablet Commonly known as: VITAMIN D Take 1,000 Units by mouth daily.   diltiazem 120 MG 24 hr capsule Commonly known as: CARDIZEM CD Take 1 capsule (120 mg total) by mouth daily.   ferrous sulfate 325 (65 FE) MG tablet Take 1 tablet (325 mg total) by mouth daily with breakfast.   folic acid 1 MG tablet Commonly known as: FOLVITE Take 1 mg  by mouth daily.   HYDROcodone-acetaminophen 7.5-325 MG tablet Commonly known as: NORCO Take 1 tablet by mouth every 6 (six) hours as needed for severe pain (pain score 7-10).   menthol-cetylpyridinium 3 MG lozenge Commonly known as: CEPACOL Take 1 lozenge (3 mg total) by mouth as needed for sore throat.   methotrexate 2.5 MG tablet Commonly known as: RHEUMATREX Take 7 tablets by mouth every Tuesday.   mirtazapine 15 MG tablet Commonly known as: REMERON Take 15 mg by mouth at  bedtime.   multivitamin-lutein Caps capsule Take 1 capsule by mouth 2 (two) times daily. What changed: when to take this   traZODone 50 MG tablet Commonly known as: DESYREL Take 0.5 tablets (25 mg total) by mouth at bedtime as needed for sleep.   vitamin B-12 1000 MCG tablet Commonly known as: CYANOCOBALAMIN Take 1,000 mcg by mouth daily.        DISCHARGE INSTRUCTIONS:   Follow-up Dr. At rehab 1 day Follow-up Dr. Sabra Heck orthopedics  If you experience worsening of your admission symptoms, develop shortness of breath, life threatening emergency, suicidal or homicidal thoughts you must seek medical attention immediately by calling 911 or calling your MD immediately  if symptoms less severe.  You Must read complete instructions/literature along with all the possible adverse reactions/side effects for all the Medicines you take and that have been prescribed to you. Take any new Medicines after you have completely understood and accept all the possible adverse reactions/side effects.   Please note  You were cared for by a hospitalist during your hospital stay. If you have any questions about your discharge medications or the care you received while you were in the hospital after you are discharged, you can call the unit and asked to speak with the hospitalist on call if the hospitalist that took care of you is not available. Once you are discharged, your primary care physician will handle any further medical issues. Please note that NO REFILLS for any discharge medications will be authorized once you are discharged, as it is imperative that you return to your primary care physician (or establish a relationship with a primary care physician if you do not have one) for your aftercare needs so that they can reassess your need for medications and monitor your lab values.    Today   CHIEF COMPLAINT:   Chief Complaint  Patient presents with  . Fall  . Hip Pain    HISTORY OF PRESENT  ILLNESS:  Angela Leonard  is a 86 y.o. female came in with fall and hip pain and found to have a hip fracture.   VITAL SIGNS:  Blood pressure (!) 134/94, pulse 78, temperature (!) 97 F (36.1 C), resp. rate 14, height 5\' 5"  (1.651 m), weight 74.8 kg, SpO2 97 %.  I/O:    Intake/Output Summary (Last 24 hours) at 08/15/2020 0814 Last data filed at 08/15/2020 0500 Gross per 24 hour  Intake --  Output 800 ml  Net -800 ml    PHYSICAL EXAMINATION:  GENERAL:  86 y.o.-year-old patient lying in the bed with no acute distress.  EYES: Pupils equal, round, reactive to light and accommodation. No scleral icterus. HEENT: Head atraumatic, normocephalic. Oropharynx and nasopharynx clear.   LUNGS: Normal breath sounds bilaterally, no wheezing, rales,rhonchi or crepitation. No use of accessory muscles of respiration.  CARDIOVASCULAR: S1, S2 irregular irregular. No murmurs, rubs, or gallops.  ABDOMEN: Soft, non-tender, non-distended.  EXTREMITIES: No pedal edema.  NEUROLOGIC: Cranial nerves II through XII are  intact.  PSYCHIATRIC: The patient is alert and answers some yes/no questions.  SKIN: No obvious rash, lesion, or ulcer.   DATA REVIEW:   CBC Recent Labs  Lab 08/15/20 0605  WBC 11.5*  HGB 12.9  HCT 39.2  PLT 162    Chemistries  Recent Labs  Lab 08/12/20 0446 08/13/20 0421 08/14/20 0428  NA 135   < > 134*  K 5.0   < > 5.1  CL 104   < > 107  CO2 23   < > 23  GLUCOSE 132*   < > 126*  BUN 36*   < > 46*  CREATININE 1.18*   < > 0.93  CALCIUM 8.9   < > 8.4*  MG 1.8  --   --    < > = values in this interval not displayed.     Microbiology Results  Results for orders placed or performed during the hospital encounter of 08/11/20  Resp Panel by RT-PCR (Flu A&B, Covid) Nasopharyngeal Swab     Status: None   Collection Time: 08/11/20  8:07 AM   Specimen: Nasopharyngeal Swab; Nasopharyngeal(NP) swabs in vial transport medium  Result Value Ref Range Status   SARS Coronavirus 2 by  RT PCR NEGATIVE NEGATIVE Final    Comment: (NOTE) SARS-CoV-2 target nucleic acids are NOT DETECTED.  The SARS-CoV-2 RNA is generally detectable in upper respiratory specimens during the acute phase of infection. The lowest concentration of SARS-CoV-2 viral copies this assay can detect is 138 copies/mL. A negative result does not preclude SARS-Cov-2 infection and should not be used as the sole basis for treatment or other patient management decisions. A negative result may occur with  improper specimen collection/handling, submission of specimen other than nasopharyngeal swab, presence of viral mutation(s) within the areas targeted by this assay, and inadequate number of viral copies(<138 copies/mL). A negative result must be combined with clinical observations, patient history, and epidemiological information. The expected result is Negative.  Fact Sheet for Patients:  EntrepreneurPulse.com.au  Fact Sheet for Healthcare Providers:  IncredibleEmployment.be  This test is no t yet approved or cleared by the [place] FDA and  has been authorized for detection and/or diagnosis of SARS-CoV-2 by FDA under an Emergency Use Authorization (EUA). This EUA will remain  in effect (meaning this test can be used) for the duration of the COVID-19 declaration under Section 564(b)(1) of the Act, 21 U.S.C.section 360bbb-3(b)(1), unless the authorization is terminated  or revoked sooner.       Influenza A by PCR NEGATIVE NEGATIVE Final   Influenza B by PCR NEGATIVE NEGATIVE Final    Comment: (NOTE) The Xpert Xpress SARS-CoV-2/FLU/RSV plus assay is intended as an aid in the diagnosis of influenza from Nasopharyngeal swab specimens and should not be used as a sole basis for treatment. Nasal washings and aspirates are unacceptable for Xpert Xpress SARS-CoV-2/FLU/RSV testing.  Fact Sheet for Patients: EntrepreneurPulse.com.au  Fact Sheet  for Healthcare Providers: IncredibleEmployment.be  This test is not yet approved or cleared by the [place] FDA and has been authorized for detection and/or diagnosis of SARS-CoV-2 by FDA under an Emergency Use Authorization (EUA). This EUA will remain in effect (meaning this test can be used) for the duration of the COVID-19 declaration under Section 564(b)(1) of the Act, 21 U.S.C. section 360bbb-3(b)(1), unless the authorization is terminated or revoked.  Performed at [facility], [address], [place], [place] [address]   Surgical PCR screen     Status: None  Collection Time: 08/12/20  8:34 AM   Specimen: Nasal Mucosa; Nasal Swab  Result Value Ref Range Status   MRSA, PCR NEGATIVE NEGATIVE Final   Staphylococcus aureus NEGATIVE NEGATIVE Final    Comment: (NOTE) The Xpert SA Assay (FDA approved for NASAL specimens in patients 78 years of age and older), is one component of a comprehensive surveillance program. It is not intended to diagnose infection nor to guide or monitor treatment. Performed at [facility], [place], [place] [address]   SARS CORONAVIRUS 2 (TAT 6-24 HRS) Nasopharyngeal Nasopharyngeal Swab     Status: None   Collection Time: 08/14/20  5:46 PM   Specimen: Nasopharyngeal Swab  Result Value Ref Range Status   SARS Coronavirus 2 NEGATIVE NEGATIVE Final    Comment: (NOTE) SARS-CoV-2 target nucleic acids are NOT DETECTED.  The SARS-CoV-2 RNA is generally detectable in upper and lower respiratory specimens during the acute phase of infection. Negative results do not preclude SARS-CoV-2 infection, do not rule out co-infections with other pathogens, and should not be used as the sole basis for treatment or other patient management decisions. Negative results must be combined with clinical observations, patient history, and epidemiological information. The expected result is Negative.  Fact Sheet for  Patients: SugarRoll.be  Fact Sheet for Healthcare Providers: https://www.woods-mathews.com/  This test is not yet approved or cleared by the [place] FDA and  has been authorized for detection and/or diagnosis of SARS-CoV-2 by FDA under an Emergency Use Authorization (EUA). This EUA will remain  in effect (meaning this test can be used) for the duration of the COVID-19 declaration under Se ction 564(b)(1) of the Act, 21 U.S.C. section 360bbb-3(b)(1), unless the authorization is terminated or revoked sooner.  Performed at [facility], [place] [address], [place],  [address]     RADIOLOGY:  ECHOCARDIOGRAM COMPLETE  Result Date: 08/14/2020    ECHOCARDIOGRAM REPORT   Patient Name:   SARIKA BALDINI Date of Exam: 08/14/2020 Medical Rec #:  [id number]     Height:       65.0 in Accession #:    [id number]    Weight:       165.0 lb Date of Birth:  1934-12-27     BSA:          1.823 m Patient Age:    39 years      BP:           127/63 mmHg Patient Gender: F             HR:           77 bpm. Exam Location:  [facility] Procedure: 2D Echo, Cardiac Doppler and Color Doppler Indications:     Atrial Flutter I48.92  History:         Patient has no prior history of Echocardiogram examinations.                  Risk Factors:Hypertension and Dyslipidemia.  Sonographer:     Sherrie Sport RDCS (AE) Referring Phys:  [id number] Loletha Grayer Diagnosing Phys: Bartholome Bill MD IMPRESSIONS  1. Left ventricular ejection fraction, by estimation, is 60 to 65%. The left ventricle has normal function. The left ventricle has no regional wall motion abnormalities. There is mild left ventricular hypertrophy. Left ventricular diastolic parameters are consistent with Grade I diastolic dysfunction (impaired relaxation).  2. Right ventricular systolic function is normal. The right ventricular size is normal.  3. Left atrial size was mildly dilated.  4. The mitral valve is abnormal. Moderate  mitral valve regurgitation.  5. The aortic valve is grossly normal. Aortic valve regurgitation is mild. FINDINGS  Left Ventricle: Left ventricular ejection fraction, by estimation, is 60 to 65%. The left ventricle has normal function. The left ventricle has no regional wall motion abnormalities. The left ventricular internal cavity size was normal in size. There is  mild left ventricular hypertrophy. Left ventricular diastolic parameters are consistent with Grade I diastolic dysfunction (impaired relaxation). Right Ventricle: The right ventricular size is normal. No increase in right ventricular wall thickness. Right ventricular systolic function is normal. Left Atrium: Left atrial size was mildly dilated. Right Atrium: Right atrial size was normal in size. Pericardium: There is no evidence of pericardial effusion. Mitral Valve: The mitral valve is abnormal. Moderate mitral valve regurgitation. MV peak gradient, 20.6 mmHg. The mean mitral valve gradient is 7.0 mmHg. Tricuspid Valve: The tricuspid valve is grossly normal. Tricuspid valve regurgitation is mild. Aortic Valve: The aortic valve is grossly normal. Aortic valve regurgitation is mild. Aortic valve mean gradient measures 2.3 mmHg. Aortic valve peak gradient measures 3.9 mmHg. Aortic valve area, by VTI measures 1.46 cm. Pulmonic Valve: The pulmonic valve was not well visualized. Pulmonic valve regurgitation is trivial. Aorta: The aortic root is normal in size and structure. IAS/Shunts: The atrial septum is grossly normal.  LEFT VENTRICLE PLAX 2D LVIDd:         3.49 cm LVIDs:         2.22 cm LV PW:         1.06 cm LV IVS:        0.95 cm LVOT diam:     2.00 cm LV SV:         31 LV SV Index:   17 LVOT Area:     3.14 cm  RIGHT VENTRICLE RV Basal diam:  2.70 cm RV S prime:     9.46 cm/s TAPSE (M-mode): 3.4 cm LEFT ATRIUM             Index       RIGHT ATRIUM           Index LA diam:        3.40 cm 1.87 cm/m  RA Area:     17.50 cm LA Vol (A2C):   90.2 ml 49.48  ml/m RA Volume:   43.40 ml  23.81 ml/m LA Vol (A4C):   56.6 ml 31.05 ml/m LA Biplane Vol: 71.1 ml 39.00 ml/m  AORTIC VALVE                   PULMONIC VALVE AV Area (Vmax):    1.37 cm    PV Vmax:        0.45 m/s AV Area (Vmean):   1.42 cm    PV Peak grad:   0.8 mmHg AV Area (VTI):     1.46 cm    RVOT Peak grad: 2 mmHg AV Vmax:           98.90 cm/s AV Vmean:          71.033 cm/s AV VTI:            0.211 m AV Peak Grad:      3.9 mmHg AV Mean Grad:      2.3 mmHg LVOT Vmax:         43.10 cm/s LVOT Vmean:        32.100 cm/s LVOT VTI:  0.098 m LVOT/AV VTI ratio: 0.46  AORTA Ao Root diam: 2.93 cm MITRAL VALVE                TRICUSPID VALVE MV Area (PHT): 3.45 cm     TR Peak grad:   27.5 mmHg MV Area VTI:   0.72 cm     TR Vmax:        262.00 cm/s MV Peak grad:  20.6 mmHg MV Mean grad:  7.0 mmHg     SHUNTS MV Vmax:       2.27 m/s     Systemic VTI:  0.10 m MV Vmean:      118.0 cm/s   Systemic Diam: 2.00 cm MV Decel Time: 220 msec MV E velocity: 151.00 cm/s Bartholome Bill MD Electronically signed by Bartholome Bill MD Signature Date/Time: 08/14/2020/12:06:27 PM    Final     Management plans discussed with the patient, family and they are in agreement.  CODE STATUS:     Code Status Orders  (From admission, onward)         Start     Ordered   08/11/20 1529  Limited resuscitation (code)  Continuous       Question Answer Comment  In the event of cardiac or respiratory ARREST: Initiate Code Blue, Call Rapid Response Yes   In the event of cardiac or respiratory ARREST: Perform CPR No   In the event of cardiac or respiratory ARREST: Perform Intubation/Mechanical Ventilation Yes   In the event of cardiac or respiratory ARREST: Use NIPPV/BiPAp only if indicated Yes   In the event of cardiac or respiratory ARREST: Administer ACLS medications if indicated Yes   In the event of cardiac or respiratory ARREST: Perform Defibrillation or Cardioversion if indicated Yes      08/11/20 1528        Code Status  History    Date Active Date Inactive Code Status Order ID Comments User Context   08/24/2019 1241 08/27/2019 2346 Full Code [id number]  Ivor Costa, MD ED   02/19/2019 1337 02/22/2019 1923 Full Code [id number]  Epifanio Lesches, MD ED   08/04/2018 0031 08/06/2018 1859 Full Code [id number]  Lance Coon, MD Inpatient   09/15/2017 0620 09/17/2017 1431 Full Code [id number]  Harrie Foreman, MD Inpatient   10/13/2014 0556 10/15/2014 2057 Full Code [id number]  Molly Maduro, MD ED   Advance Care Planning Activity      TOTAL TIME TAKING CARE OF THIS PATIENT: 32 minutes.    Loletha Grayer M.D on 08/15/2020 at 8:14 AM  Between 7am to 6pm - Pager - [phone]  After 6pm go to [email or web address] - password [email or web address]  [facility]  CC: Primary care physician; Derinda Late, MD

## 2020-08-15 NOTE — Care Management Important Message (Signed)
Important Message  Patient Details  Name: Angela Leonard MRN: [id number] Date of Birth: 27-Sep-1934   Medicare Important Message Given:  N/A - LOS <3 / Initial given by admissions     Juliann Pulse A Onetta Spainhower 08/15/2020, 8:44 AM

## 2020-08-15 NOTE — TOC Transition Note (Addendum)
Transition of Care [facility]) - CM/SW Discharge Note   Patient Details  Name: Angela Leonard MRN: [id number] Date of Birth: 19-Nov-1934  [facility]) CM/SW Contact:  Candie Chroman, LCSW Phone Number: 08/15/2020, 11:13 AM   Clinical Narrative: Patient has orders to discharge to [facility] today. RN will call report to [phone] (Room 701). EMS transport set up for 12:00. No further concerns. CSW signing off.    1:01 pm: EMS needs MOST form or DNR for transport. Patient is a partial code and wants everything done except for compressions. Per MD, will be full code for transport only.  Final next level of care: Skilled Nursing Facility Barriers to Discharge: Barriers Resolved   Patient Goals and CMS Choice Patient states their goals for this hospitalization and ongoing recovery are:: SNF rehab CMS Medicare.gov Compare Post Acute Care list provided to:: Patient Represenative (must comment) Choice offered to / list presented to : Adult Children  Discharge Placement   Existing PASRR number confirmed : 08/13/20          Patient chooses bed at: [facility] [place] Patient to be transferred to facility by: EMS Name of family member notified: Caili Escalera Patient and family notified of of transfer: 08/15/20  Discharge Plan and Services                                     Social Determinants of Health (SDOH) Interventions     Readmission Risk Interventions Readmission Risk Prevention Plan 08/13/2020  Transportation Screening Complete  PCP or Specialist Appt within 3-5 Days Complete  HRI or [place] Complete  Social Work Consult for [place] Planning/Counseling Complete  Palliative Care Screening Not Applicable  Medication Review Press photographer) Complete  Some recent data might be hidden

## 2020-08-15 NOTE — Plan of Care (Signed)

## 2020-08-15 NOTE — Plan of Care (Signed)
  Problem: Education: Goal: Knowledge of General Education information will improve Description: Including pain rating scale, medication(s)/side effects and non-pharmacologic comfort measures 08/15/2020 1159 by Jules Schick, RN Outcome: Completed/Met 08/15/2020 1002 by Jules Schick, RN Outcome: Progressing   Problem: Health Behavior/Discharge Planning: Goal: Ability to manage health-related needs will improve 08/15/2020 1159 by Jules Schick, RN Outcome: Completed/Met 08/15/2020 1002 by Jules Schick, RN Outcome: Progressing   Problem: Clinical Measurements: Goal: Ability to maintain clinical measurements within normal limits will improve 08/15/2020 1159 by Jules Schick, RN Outcome: Completed/Met 08/15/2020 1002 by Jules Schick, RN Outcome: Progressing Goal: Will remain free from infection 08/15/2020 1159 by Jules Schick, RN Outcome: Completed/Met 08/15/2020 1002 by Jules Schick, RN Outcome: Progressing Goal: Diagnostic test results will improve 08/15/2020 1159 by Jules Schick, RN Outcome: Completed/Met 08/15/2020 1002 by Jules Schick, RN Outcome: Progressing Goal: Respiratory complications will improve 08/15/2020 1159 by Jules Schick, RN Outcome: Completed/Met 08/15/2020 1002 by Jules Schick, RN Outcome: Progressing Goal: Cardiovascular complication will be avoided 08/15/2020 1159 by Jules Schick, RN Outcome: Completed/Met 08/15/2020 1002 by Jules Schick, RN Outcome: Progressing   Problem: Activity: Goal: Risk for activity intolerance will decrease 08/15/2020 1159 by Jules Schick, RN Outcome: Completed/Met 08/15/2020 1002 by Jules Schick, RN Outcome: Progressing   Problem: Nutrition: Goal: Adequate nutrition will be maintained 08/15/2020 1159 by Jules Schick, RN Outcome: Completed/Met 08/15/2020 1002 by Jules Schick, RN Outcome: Progressing   Problem: Coping: Goal: Level of anxiety will  decrease 08/15/2020 1159 by Jules Schick, RN Outcome: Completed/Met 08/15/2020 1002 by Jules Schick, RN Outcome: Progressing   Problem: Elimination: Goal: Will not experience complications related to bowel motility 08/15/2020 1159 by Jules Schick, RN Outcome: Completed/Met 08/15/2020 1002 by Jules Schick, RN Outcome: Progressing Goal: Will not experience complications related to urinary retention 08/15/2020 1159 by Jules Schick, RN Outcome: Completed/Met 08/15/2020 1002 by Jules Schick, RN Outcome: Progressing   Problem: Pain Managment: Goal: General experience of comfort will improve 08/15/2020 1159 by Jules Schick, RN Outcome: Completed/Met 08/15/2020 1002 by Jules Schick, RN Outcome: Progressing   Problem: Safety: Goal: Ability to remain free from injury will improve 08/15/2020 1159 by Jules Schick, RN Outcome: Completed/Met 08/15/2020 1002 by Jules Schick, RN Outcome: Progressing   Problem: Skin Integrity: Goal: Risk for impaired skin integrity will decrease 08/15/2020 1159 by Jules Schick, RN Outcome: Completed/Met 08/15/2020 1002 by Jules Schick, RN Outcome: Progressing

## 2020-11-29 ENCOUNTER — Inpatient Hospital Stay
Admission: EM | Admit: 2020-11-29 | Discharge: 2020-12-01 | DRG: 872 | Disposition: A | Discharge status: Home / Self Care | Payer: Medicare Other | Attending: Internal Medicine | Admitting: Internal Medicine

## 2020-11-29 ENCOUNTER — Emergency Department: Payer: Medicare Other

## 2020-11-29 ENCOUNTER — Other Ambulatory Visit: Payer: Self-pay

## 2020-11-29 DIAGNOSIS — Z79899 Other long term (current) drug therapy: Secondary | ICD-10-CM

## 2020-11-29 DIAGNOSIS — J984 Other disorders of lung: Secondary | ICD-10-CM | POA: Diagnosis present

## 2020-11-29 DIAGNOSIS — Z8249 Family history of ischemic heart disease and other diseases of the circulatory system: Secondary | ICD-10-CM | POA: Diagnosis not present

## 2020-11-29 DIAGNOSIS — M25551 Pain in right hip: Secondary | ICD-10-CM | POA: Diagnosis present

## 2020-11-29 DIAGNOSIS — M1611 Unilateral primary osteoarthritis, right hip: Secondary | ICD-10-CM | POA: Diagnosis present

## 2020-11-29 DIAGNOSIS — I7090 Unspecified atherosclerosis: Secondary | ICD-10-CM | POA: Diagnosis present

## 2020-11-29 DIAGNOSIS — K429 Umbilical hernia without obstruction or gangrene: Secondary | ICD-10-CM | POA: Diagnosis present

## 2020-11-29 DIAGNOSIS — D7589 Other specified diseases of blood and blood-forming organs: Secondary | ICD-10-CM | POA: Diagnosis present

## 2020-11-29 DIAGNOSIS — Z20822 Contact with and (suspected) exposure to covid-19: Secondary | ICD-10-CM | POA: Diagnosis present

## 2020-11-29 DIAGNOSIS — I482 Chronic atrial fibrillation, unspecified: Secondary | ICD-10-CM | POA: Diagnosis present

## 2020-11-29 DIAGNOSIS — Z7901 Long term (current) use of anticoagulants: Secondary | ICD-10-CM

## 2020-11-29 DIAGNOSIS — M069 Rheumatoid arthritis, unspecified: Secondary | ICD-10-CM | POA: Diagnosis present

## 2020-11-29 DIAGNOSIS — I4891 Unspecified atrial fibrillation: Secondary | ICD-10-CM

## 2020-11-29 DIAGNOSIS — Z87891 Personal history of nicotine dependence: Secondary | ICD-10-CM | POA: Diagnosis not present

## 2020-11-29 DIAGNOSIS — A419 Sepsis, unspecified organism: Secondary | ICD-10-CM | POA: Diagnosis not present

## 2020-11-29 DIAGNOSIS — R296 Repeated falls: Secondary | ICD-10-CM | POA: Diagnosis present

## 2020-11-29 DIAGNOSIS — N3000 Acute cystitis without hematuria: Secondary | ICD-10-CM | POA: Diagnosis present

## 2020-11-29 DIAGNOSIS — Z820 Family history of epilepsy and other diseases of the nervous system: Secondary | ICD-10-CM | POA: Diagnosis not present

## 2020-11-29 DIAGNOSIS — I10 Essential (primary) hypertension: Secondary | ICD-10-CM | POA: Diagnosis present

## 2020-11-29 DIAGNOSIS — F0390 Unspecified dementia without behavioral disturbance: Secondary | ICD-10-CM | POA: Diagnosis present

## 2020-11-29 DIAGNOSIS — E785 Hyperlipidemia, unspecified: Secondary | ICD-10-CM | POA: Diagnosis present

## 2020-11-29 DIAGNOSIS — N390 Urinary tract infection, site not specified: Secondary | ICD-10-CM

## 2020-11-29 LAB — URINALYSIS, COMPLETE (UACMP) WITH MICROSCOPIC
Bilirubin Urine: NEGATIVE
Glucose, UA: NEGATIVE mg/dL
Ketones, ur: NEGATIVE mg/dL
Nitrite: POSITIVE — AB
Protein, ur: NEGATIVE mg/dL
Specific Gravity, Urine: 1.025 (ref 1.005–1.030)
pH: 5 (ref 5.0–8.0)

## 2020-11-29 LAB — CBC WITH DIFFERENTIAL/PLATELET
Abs Immature Granulocytes: 0.1 10*3/uL — ABNORMAL HIGH (ref 0.00–0.07)
Basophils Absolute: 0 10*3/uL (ref 0.0–0.1)
Basophils Relative: 0 %
Eosinophils Absolute: 0 10*3/uL (ref 0.0–0.5)
Eosinophils Relative: 0 %
HCT: 43.3 % (ref 36.0–46.0)
Hemoglobin: 14.4 g/dL (ref 12.0–15.0)
Immature Granulocytes: 1 %
Lymphocytes Relative: 16 %
Lymphs Abs: 2.6 10*3/uL (ref 0.7–4.0)
MCH: 35.1 pg — ABNORMAL HIGH (ref 26.0–34.0)
MCHC: 33.3 g/dL (ref 30.0–36.0)
MCV: 105.6 fL — ABNORMAL HIGH (ref 80.0–100.0)
Monocytes Absolute: 0.8 10*3/uL (ref 0.1–1.0)
Monocytes Relative: 5 %
Neutro Abs: 12.5 10*3/uL — ABNORMAL HIGH (ref 1.7–7.7)
Neutrophils Relative %: 78 %
Platelets: 177 10*3/uL (ref 150–400)
RBC: 4.1 MIL/uL (ref 3.87–5.11)
RDW: 15.7 % — ABNORMAL HIGH (ref 11.5–15.5)
WBC: 16.1 10*3/uL — ABNORMAL HIGH (ref 4.0–10.5)
nRBC: 0 % (ref 0.0–0.2)

## 2020-11-29 LAB — BASIC METABOLIC PANEL
Anion gap: 8 (ref 5–15)
BUN: 29 mg/dL — ABNORMAL HIGH (ref 8–23)
CO2: 22 mmol/L (ref 22–32)
Calcium: 9.2 mg/dL (ref 8.9–10.3)
Chloride: 105 mmol/L (ref 98–111)
Creatinine, Ser: 0.91 mg/dL (ref 0.44–1.00)
GFR, Estimated: >60 mL/min (ref >60)
Glucose, Bld: 119 mg/dL — ABNORMAL HIGH (ref 70–99)
Potassium: 4.1 mmol/L (ref 3.5–5.1)
Sodium: 135 mmol/L (ref 135–145)

## 2020-11-29 LAB — LACTIC ACID, PLASMA
Lactic Acid, Venous: 1.1 mmol/L (ref 0.5–1.9)
Lactic Acid, Venous: 0.8 mmol/L (ref 0.5–1.9)

## 2020-11-29 MED ORDER — APIXABAN 2.5 MG PO TABS
2.5000 mg | ORAL_TABLET | Freq: Two times a day (BID) | ORAL | Status: DC
  Administered 2020-11-29 – 2020-12-01 (×4): 2.5 mg via ORAL
  Filled 2020-11-29 (×5): qty 1

## 2020-11-29 MED ORDER — DILTIAZEM HCL ER COATED BEADS 180 MG PO CP24
180.0000 mg | ORAL_CAPSULE | Freq: Every day | ORAL | Status: DC
  Administered 2020-11-30 – 2020-12-01 (×2): 180 mg via ORAL
  Filled 2020-11-29 (×2): qty 1

## 2020-11-29 MED ORDER — ONDANSETRON HCL 4 MG/2ML IJ SOLN: 4.0000 mg | Freq: Four times a day (QID) | INTRAMUSCULAR | Status: DC | PRN

## 2020-11-29 MED ORDER — KETOROLAC TROMETHAMINE 15 MG/ML IJ SOLN
15.0000 mg | Freq: Once | INTRAMUSCULAR | Status: DC
  Filled 2020-11-29: qty 1

## 2020-11-29 MED ORDER — SODIUM CHLORIDE 0.9% FLUSH: 3.0000 mL | INTRAVENOUS | Status: DC | PRN

## 2020-11-29 MED ORDER — HYDROCODONE-ACETAMINOPHEN 7.5-325 MG PO TABS: 1.0000 | ORAL_TABLET | Freq: Four times a day (QID) | ORAL | Status: DC | PRN

## 2020-11-29 MED ORDER — METOPROLOL TARTRATE 5 MG/5ML IV SOLN: 5.0000 mg | Freq: Four times a day (QID) | INTRAVENOUS | Status: DC | PRN

## 2020-11-29 MED ORDER — BISACODYL 5 MG PO TBEC: 10.0000 mg | DELAYED_RELEASE_TABLET | Freq: Every day | ORAL | Status: DC | PRN

## 2020-11-29 MED ORDER — BISACODYL 5 MG PO TBEC: 5.0000 mg | DELAYED_RELEASE_TABLET | Freq: Every day | ORAL | Status: DC | PRN

## 2020-11-29 MED ORDER — METHYLPREDNISOLONE SODIUM SUCC 40 MG IJ SOLR
40.0000 mg | Freq: Two times a day (BID) | INTRAMUSCULAR | Status: DC
  Administered 2020-11-29 – 2020-11-30 (×2): 40 mg via INTRAVENOUS
  Filled 2020-11-29 (×2): qty 1

## 2020-11-29 MED ORDER — SODIUM CHLORIDE 0.9 % IV SOLN: 250.0000 mL | INTRAVENOUS | Status: DC | PRN

## 2020-11-29 MED ORDER — POLYETHYLENE GLYCOL 3350 17 G PO PACK
17.0000 g | PACK | Freq: Every day | ORAL | Status: DC
  Administered 2020-11-29 – 2020-12-01 (×3): 17 g via ORAL
  Filled 2020-11-29 (×3): qty 1

## 2020-11-29 MED ORDER — HYDROCODONE-ACETAMINOPHEN 5-325 MG PO TABS
1.0000 | ORAL_TABLET | Freq: Once | ORAL | Status: AC
Start: 2020-11-29 — End: 2020-11-29
  Administered 2020-11-29: 1 via ORAL
  Filled 2020-11-29: qty 1

## 2020-11-29 MED ORDER — LACTATED RINGERS IV BOLUS (SEPSIS)
1000.0000 mL | Freq: Once | INTRAVENOUS | Status: AC
  Administered 2020-11-29: 1000 mL via INTRAVENOUS

## 2020-11-29 MED ORDER — SODIUM CHLORIDE 0.9 % IV SOLN
1.0000 g | INTRAVENOUS | Status: DC
  Administered 2020-11-29 – 2020-11-30 (×2): 1 g via INTRAVENOUS
  Filled 2020-11-29 (×3): qty 10

## 2020-11-29 MED ORDER — BISACODYL 10 MG RE SUPP
10.0000 mg | Freq: Every day | RECTAL | Status: DC | PRN
  Filled 2020-11-29: qty 1

## 2020-11-29 MED ORDER — ACETAMINOPHEN 325 MG PO TABS: 650.0000 mg | ORAL_TABLET | Freq: Four times a day (QID) | ORAL | Status: DC | PRN

## 2020-11-29 MED ORDER — DILTIAZEM HCL ER BEADS 180 MG PO CP24: 180.0000 mg | ORAL_CAPSULE | Freq: Every day | ORAL | Status: DC

## 2020-11-29 MED ORDER — SODIUM CHLORIDE 0.9 % IV SOLN: INTRAVENOUS | Status: DC

## 2020-11-29 MED ORDER — ONDANSETRON HCL 4 MG PO TABS: 4.0000 mg | ORAL_TABLET | Freq: Four times a day (QID) | ORAL | Status: DC | PRN

## 2020-11-29 MED ORDER — SODIUM CHLORIDE 0.9% FLUSH
3.0000 mL | Freq: Two times a day (BID) | INTRAVENOUS | Status: DC
  Administered 2020-11-29 – 2020-12-01 (×4): 3 mL via INTRAVENOUS

## 2020-11-29 MED ORDER — LACTATED RINGERS IV BOLUS (SEPSIS): 250.0000 mL | Freq: Once | INTRAVENOUS | Status: DC

## 2020-11-29 MED ORDER — POLYETHYLENE GLYCOL 3350 17 G PO PACK: 17.0000 g | PACK | Freq: Every day | ORAL | Status: DC | PRN

## 2020-11-29 NOTE — ED Provider Notes (Signed)
[facility] Emergency Department Provider Note ____________________________________________   Event Date/Time   First MD Initiated Contact with Patient 11/29/20 1122     (approximate)  I have reviewed the triage vital signs and the nursing notes.   HISTORY  Chief Complaint Hip Pain  Level 5 caveat: History of present illness limited due to dementia  HPI Steffanie Mingle is a 86 y.o. female with PMH as noted below who presents with right hip and lower abdominal pain over the last 2 days.  Per the caregiver, she initially reported pain in the lower abdomen but then started to have trouble putting weight on the right leg and would lift the leg with her hands to move it, due to pain.  The patient has had no fever or vomiting.  She denies any dysuria.  She has not had any fall or other trauma.  Past Medical History:  Diagnosis Date   HLD (hyperlipidemia)    Hypertension     Patient Active Problem List   Diagnosis Date Noted   Right hip pain 11/29/2020   Atrial fibrillation with RVR (HCC)    Hypotension    Mass of skin of head    Dementia without behavioral disturbance ([place])    Closed hip fracture requiring operative repair, left, sequela 08/11/2020   Dehydration 08/25/2019   Acute lower UTI 08/24/2019   Fall at home, initial encounter 08/24/2019   Hypokalemia 08/24/2019   RA (rheumatoid arthritis) ([place]) 08/24/2019   Fall 08/24/2019   Facial laceration    Chronic atrial fibrillation, unspecified ([place]) 02/19/2019   Sepsis ([place]) 08/03/2018   Atypical pneumonia 08/03/2018   Arthritis of right hip 08/03/2018   HTN (hypertension) 08/03/2018   HLD (hyperlipidemia) 08/03/2018   AKI (acute kidney injury) ([place]) 09/15/2017   Symptomatic cholelithiasis 10/13/2014    Past Surgical History:  Procedure Laterality Date   BACK SURGERY     CHOLECYSTECTOMY N/A 10/14/2014   Procedure: LAPAROSCOPIC CHOLECYSTECTOMY;  Surgeon: Marlyce Huge, MD;  Location:  [facility] ORS;  Service: General;  Laterality: N/A;   COLON SURGERY     HIP PINNING,CANNULATED Left 08/12/2020   Procedure: CANNULATED HIP PINNING;  Surgeon: Earnestine Leys, MD;  Location: [facility] ORS;  Service: Orthopedics;  Laterality: Left;    Prior to Admission medications   Medication Sig Start Date End Date Taking? Authorizing Provider  acetaminophen (TYLENOL) 325 MG tablet Take 2 tablets (650 mg total) by mouth every 6 (six) hours as needed for mild pain or moderate pain (pain score 1-3 or temp > 100.5). 08/15/20   Loletha Grayer, MD  apixaban (ELIQUIS) 2.5 MG TABS tablet Take 1 tablet (2.5 mg total) by mouth every 12 (twelve) hours. 08/15/20   Loletha Grayer, MD  cholecalciferol (VITAMIN D) 1000 units tablet Take 1,000 Units by mouth daily.    [provider]  diltiazem (TIAZAC) 180 MG 24 hr capsule Take 180 mg by mouth daily. 11/06/20   [provider]  ferrous sulfate 325 (65 FE) MG tablet Take 1 tablet (325 mg total) by mouth daily with breakfast. 08/15/20   Loletha Grayer, MD  folic acid (FOLVITE) 1 MG tablet Take 1 mg by mouth daily.    [provider]  HYDROcodone-acetaminophen (NORCO) 7.5-325 MG tablet Take 1 tablet by mouth every 6 (six) hours as needed for severe pain (pain score 7-10). 08/15/20   Loletha Grayer, MD  methotrexate (RHEUMATREX) 2.5 MG tablet Take 7 tablets by mouth every Tuesday.     [provider]  mirtazapine (REMERON) 15 MG tablet Take 15 mg by mouth at bedtime. 07/14/20   [provider]  multivitamin-lutein (OCUVITE-LUTEIN) CAPS capsule Take 1 capsule by mouth 2 (two) times daily. 08/15/20   Loletha Grayer, MD  ramipril (ALTACE) 5 MG capsule Take 5 mg by mouth daily. 10/11/20   [provider]  traZODone (DESYREL) 50 MG tablet Take 0.5 tablets (25 mg total) by mouth at bedtime as needed for sleep. 08/15/20   Loletha Grayer, MD  vitamin B-12 (CYANOCOBALAMIN) 1000 MCG tablet Take 1,000 mcg by mouth daily.     [provider]    Allergies Patient has no known allergies.  Family History  Problem Relation Age of Onset   Alzheimer's disease Mother    Parkinson's disease Mother    Cancer Father    Heart disease Sister    Parkinson's disease Brother    Heart disease Brother     Social History Social History   Tobacco Use   Smoking status: Former    Pack years: 0.00   Smokeless tobacco: Never  Substance Use Topics   Alcohol use: No   Drug use: No    Review of Systems Level 5 caveat: Review of systems limited due to dementia Constitutional: No fever. Cardiovascular: Denies chest pain. Respiratory: Denies shortness of breath. Gastrointestinal: No vomiting or diarrhea. Genitourinary: Negative for dysuria.  Musculoskeletal: Positive for right hip pain.   ____________________________________________   PHYSICAL EXAM:  VITAL SIGNS: ED Triage Vitals  Enc Vitals Group     BP 11/29/20 1106 93/65     Pulse Rate 11/29/20 1106 (!) 116     Resp 11/29/20 1106 18     Temp 11/29/20 1106 98.9 F (37.2 C)     Temp Source 11/29/20 1106 Oral     SpO2 11/29/20 1106 91 %     Weight 11/29/20 1114 164 lb 14.5 oz (74.8 kg)     Height 11/29/20 1114 5\' 5"  (1.651 m)     Head Circumference --      Peak Flow --      Pain Score 11/29/20 1104 10     Pain Loc --      Pain Edu? --      Excl. in [place]? --     Constitutional: Alert and oriented.  Somewhat frail appearing but in no acute distress. Eyes: Conjunctivae are normal.  Head: Atraumatic. Nose: No congestion/rhinnorhea. Mouth/Throat: Mucous membranes are moist.   Neck: Normal range of motion.  Cardiovascular: Normal rate, regular rhythm. Good peripheral circulation. Respiratory: Normal respiratory effort.  No retractions.  Gastrointestinal: Soft with moderate right lower quadrant and suprapubic tenderness.  Right lower quadrant hernia which is reducible and chronic per caregiver.  No distention.  Genitourinary: No flank  tenderness. Musculoskeletal: No lower extremity edema.  Extremities warm and well perfused.  Pain on range of motion of right hip with no shortening or deformity. Neurologic:  Normal speech and language.  Motor and sensory intact in all extremities. Skin:  Skin is warm and dry. No rash noted. Psychiatric: Mood and affect are normal. Speech and behavior are normal.  ____________________________________________   LABS (all labs ordered are listed, but only abnormal results are displayed)  Labs Reviewed  CBC WITH DIFFERENTIAL/PLATELET - Abnormal; Notable for the following components:      Result Value   WBC 16.1 (*)    MCV 105.6 (*)    MCH 35.1 (*)    RDW 15.7 (*)    Neutro Abs 12.5 (*)  Abs Immature Granulocytes 0.10 (*)    All other components within normal limits  URINALYSIS, COMPLETE (UACMP) WITH MICROSCOPIC - Abnormal; Notable for the following components:   Color, Urine YELLOW (*)    APPearance HAZY (*)    Hgb urine dipstick MODERATE (*)    Nitrite POSITIVE (*)    Leukocytes,Ua MODERATE (*)    Bacteria, UA MANY (*)    All other components within normal limits  BASIC METABOLIC PANEL - Abnormal; Notable for the following components:   Glucose, Bld 119 (*)    BUN 29 (*)    All other components within normal limits  CULTURE, BLOOD (ROUTINE X 2)  CULTURE, BLOOD (ROUTINE X 2)  LACTIC ACID, PLASMA  LACTIC ACID, PLASMA  CBC  BASIC METABOLIC PANEL  MAGNESIUM  PHOSPHORUS   ____________________________________________  EKG   ____________________________________________  RADIOLOGY  XR R hip: Osteoarthritis with no evidence of acute fracture CT R hip: Mild right iliopsoas bursitis with no other acute findings. CT abdomen/pelvis: No acute abnormality  ____________________________________________   PROCEDURES  Procedure(s) performed: No  Procedures  Critical Care performed: Yes  CRITICAL CARE Performed by: Arta Silence   Total critical care time: 30  minutes  Critical care time was exclusive of separately billable procedures and treating other patients.  Critical care was necessary to treat or prevent imminent or life-threatening deterioration.  Critical care was time spent personally by me on the following activities: development of treatment plan with patient and/or surrogate as well as nursing, discussions with consultants, evaluation of patient's response to treatment, examination of patient, obtaining history from patient or surrogate, ordering and performing treatments and interventions, ordering and review of laboratory studies, ordering and review of radiographic studies, pulse oximetry and re-evaluation of patient's condition.  ____________________________________________   INITIAL IMPRESSION / ASSESSMENT AND PLAN / ED COURSE  Pertinent labs & imaging results that were available during my care of the patient were reviewed by me and considered in my medical decision making (see chart for details).   86 year old female with dementia and other PMH as noted above presents with right lower abdominal and hip pain over the last several days with no history of recent trauma.  I reviewed the past medical records in Epic; she does have known osteoarthritis in the right hip as well as rheumatoid arthritis.  On exam, the vital signs are normal except for mild tachycardia and borderline low blood pressure.  The abdomen is soft but there is tenderness in the suprapubic area and right lower quadrant as well as pain on range of motion of the right hip with no deformity.  The right lower extremity is neuro/vascular intact.  I have a low suspicion for fracture or other traumatic etiology.  Differential primarily includes UTI, diverticulitis, colitis, or other intra-abdominal etiology.  Based on the exam, it appears that the abdominal pain is radiating more towards the hip and causing pain on range of motion, rather than a primary issue with the hip  itself.  We will obtain lab work-up, urinalysis, CT abdomen as well as a CT of the right hip to rule out occult fracture, and reassess.  ----------------------------------------- 6:03 PM on 11/29/2020 -----------------------------------------  Urinalysis shows findings consistent with UTI.  The patient's WBC count is also elevated.  This is the likely explanation of the patient's pain.  Her CT abdomen is otherwise unremarkable.  The CT of the hip shows some bursitis but no other acute findings.  I ordered labs and fluids per the sepsis  protocol due to the patient developing hypotension and mild tachycardia.  Her initial lactate is normal.  I ordered ceftriaxone for treatment of the UTI and consulted Dr. Dwyane Dee from the hospitalist service for admission.  ____________________________________________   FINAL CLINICAL IMPRESSION(S) / ED DIAGNOSES  Final diagnoses:  Atrial fibrillation with RVR ([place])      NEW MEDICATIONS STARTED DURING THIS VISIT:  New Prescriptions   No medications on file     Note:  This document was prepared using Dragon voice recognition software and may include unintentional dictation errors.    Arta Silence, MD 11/29/20 [phone]

## 2020-11-29 NOTE — ED Triage Notes (Signed)
Pt c/o right hip/back pain for the past month, denies injury..pt is alert.

## 2020-11-29 NOTE — ED Notes (Signed)
In and out cath performed with this Nurse and Connye Burkitt. RN as the second nurse. Sterile technique performed per [facility] policy.

## 2020-11-29 NOTE — ED Notes (Signed)
ED Provider at bedside. 

## 2020-11-29 NOTE — H&P (Signed)
[facility] History and Physical   Patient: Angela Leonard WLN:[id number]   PCP: [facility] DOB: [birth year]   DOA: 11/29/2020   DOS: 11/29/2020   DOS: the patient was seen and examined on 11/29/2020  Patient coming from: The patient is coming from Home  Chief Complaint: RLQ abd pain  HPI: Angela Leonard is a 86 y.o. female with Past medical history of chronic A. fib, HTN, HLD, grade 1 diastolic dysfunction, LVEF 665%, rheumatoid arthritis, dementia, presented at [facility] ED due to complaining of right lower quadrant abdominal pain and right hip pain.  Patient was complaining of back pain as well due to difficulty positioning herself in the bed.  Patient is a poor historian, HPI reviewed from ER physician chart, as per patient's caregiver patient started having abdominal pain and trouble putting weight on her right leg and would lift her leg with her hands to move it due to pain.  No fever, no nausea vomiting.  Patient denies any dysuria.  No history of fall or trauma   ED Course: sepsis, leukocytosis, tachycardia, hypotension, UA positive, urine culture pending, lactic acid 1.1 within normal range.  Patient was given IV fluid bolus in the ED, started on maintenance IV fluid to support BP.  Blood culture obtained, CT abdomen pelvis shows beta-blocker hernia, endometrial thickening recommended ultrasound as an outpatient, right atrial dilatation. Right hip CT scan 1.  No acute osseous abnormality. 2. New mild right iliopsoas bursitis. 3. Unchanged moderate right hip osteoarthritis with small joint effusion.    Review of Systems: as mentioned in the history of present illness.  All other systems reviewed and are negative.  Past Medical History:  Diagnosis Date   HLD (hyperlipidemia)    Hypertension    Past Surgical History:  Procedure Laterality Date   BACK SURGERY     CHOLECYSTECTOMY N/A 10/14/2014   Procedure: LAPAROSCOPIC CHOLECYSTECTOMY;  Surgeon: Marlyce Huge, MD;   Location: [facility] ORS;  Service: General;  Laterality: N/A;   COLON SURGERY     HIP PINNING,CANNULATED Left 08/12/2020   Procedure: CANNULATED HIP PINNING;  Surgeon: Earnestine Leys, MD;  Location: [facility] ORS;  Service: Orthopedics;  Laterality: Left;   Social History:  reports that she has quit smoking. She has never used smokeless tobacco. She reports that she does not drink alcohol and does not use drugs.  No Known Allergies   Family history reviewed and not pertinent Family History  Problem Relation Age of Onset   Alzheimer's disease Mother    Parkinson's disease Mother    Cancer Father    Heart disease Sister    Parkinson's disease Brother    Heart disease Brother      Prior to Admission medications   Medication Sig Start Date End Date Taking? Authorizing Provider  acetaminophen (TYLENOL) 325 MG tablet Take 2 tablets (650 mg total) by mouth every 6 (six) hours as needed for mild pain or moderate pain (pain score 1-3 or temp > 100.5). 08/15/20   Loletha Grayer, MD  apixaban (ELIQUIS) 2.5 MG TABS tablet Take 1 tablet (2.5 mg total) by mouth every 12 (twelve) hours. 08/15/20   Loletha Grayer, MD  cholecalciferol (VITAMIN D) 1000 units tablet Take 1,000 Units by mouth daily.    [provider]  diltiazem (CARDIZEM CD) 120 MG 24 hr capsule Take 1 capsule (120 mg total) by mouth daily. 08/15/20   Loletha Grayer, MD  ferrous sulfate 325 (65 FE) MG tablet Take 1 tablet (325 mg total) by mouth daily  with breakfast. 08/15/20   Loletha Grayer, MD  folic acid (FOLVITE) 1 MG tablet Take 1 mg by mouth daily.    [provider]  HYDROcodone-acetaminophen (NORCO) 7.5-325 MG tablet Take 1 tablet by mouth every 6 (six) hours as needed for severe pain (pain score 7-10). 08/15/20   Loletha Grayer, MD  menthol-cetylpyridinium (CEPACOL) 3 MG lozenge Take 1 lozenge (3 mg total) by mouth as needed for sore throat. 08/15/20   Loletha Grayer, MD  methotrexate (RHEUMATREX) 2.5 MG tablet  Take 7 tablets by mouth every Tuesday.     [provider]  mirtazapine (REMERON) 15 MG tablet Take 15 mg by mouth at bedtime. 07/14/20   [provider]  multivitamin-lutein (OCUVITE-LUTEIN) CAPS capsule Take 1 capsule by mouth 2 (two) times daily. 08/15/20   Loletha Grayer, MD  traZODone (DESYREL) 50 MG tablet Take 0.5 tablets (25 mg total) by mouth at bedtime as needed for sleep. 08/15/20   Loletha Grayer, MD  vitamin B-12 (CYANOCOBALAMIN) 1000 MCG tablet Take 1,000 mcg by mouth daily.    [provider]    Physical Exam: Vitals:   11/29/20 1459 11/29/20 1500 11/29/20 1555 11/29/20 1708  BP: (!) 89/59 (!) 89/59 (!) 100/46 130/90  Pulse: 67  68 75  Resp: 18  18 19   Temp: 97.6 F (36.4 C)  97.9 F (36.6 C) 98.6 F (37 C)  TempSrc: Oral  Oral Oral  SpO2: 100%  98% 91%  Weight:      Height:        General: alert and oriented to place and person. Appear in moderate distress, affect appropriate Eyes: PERRLA, Conjunctiva normal ENT: Oral Mucosa Clear, moist  Neck: no JVD, no Abnormal Mass Or lumps Cardiovascular: Irregular rhythm due to A. fib, no Murmur, peripheral pulses symmetrical Respiratory: good respiratory effort, Bilateral Air entry equal and Decreased, signs of accessory muscle use, Clear to Auscultation, no Crackles, no wheezes Abdomen: Bowel Sound present, Soft and right-sided tenderness, periumbilical hernia Skin: no rashes no Extremities: no Pedal edema, no calf tenderness Neurologic: without any new focal findings Gait not checked due to patient safety concerns  Data Reviewed: I have personally reviewed and interpreted labs, imaging as discussed below.  CBC: Recent Labs  Lab 11/29/20 1321  WBC 16.1*  NEUTROABS 12.5*  HGB 14.4  HCT 43.3  MCV 105.6*  PLT 242   Basic Metabolic Panel: Recent Labs  Lab 11/29/20 1321  NA 135  K 4.1  CL 105  CO2 22  GLUCOSE 119*  BUN 29*  CREATININE 0.91  CALCIUM 9.2   GFR: Estimated  Creatinine Clearance: 47.4 mL/min (by C-G formula based on SCr of 0.91 mg/dL). Liver Function Tests: No results for input(s): AST, ALT, ALKPHOS, BILITOT, PROT, ALBUMIN in the last 168 hours. No results for input(s): LIPASE, AMYLASE in the last 168 hours. No results for input(s): AMMONIA in the last 168 hours. Coagulation Profile: No results for input(s): INR, PROTIME in the last 168 hours. Cardiac Enzymes: No results for input(s): CKTOTAL, CKMB, CKMBINDEX, TROPONINI in the last 168 hours. BNP (last 3 results) No results for input(s): PROBNP in the last 8760 hours. HbA1C: No results for input(s): HGBA1C in the last 72 hours. CBG: No results for input(s): GLUCAP in the last 168 hours. Lipid Profile: No results for input(s): CHOL, HDL, LDLCALC, TRIG, CHOLHDL, LDLDIRECT in the last 72 hours. Thyroid Function Tests: No results for input(s): TSH, T4TOTAL, FREET4, T3FREE, THYROIDAB in the last 72 hours. Anemia Panel: No results  for input(s): VITAMINB12, FOLATE, FERRITIN, TIBC, IRON, RETICCTPCT in the last 72 hours. Urine analysis:    Component Value Date/Time   COLORURINE YELLOW (A) 11/29/2020 1321   APPEARANCEUR HAZY (A) 11/29/2020 1321   LABSPEC 1.025 11/29/2020 1321   PHURINE 5.0 11/29/2020 1321   GLUCOSEU NEGATIVE 11/29/2020 1321   HGBUR MODERATE (A) 11/29/2020 1321   BILIRUBINUR NEGATIVE 11/29/2020 1321   KETONESUR NEGATIVE 11/29/2020 1321   PROTEINUR NEGATIVE 11/29/2020 1321   NITRITE POSITIVE (A) 11/29/2020 1321   LEUKOCYTESUR MODERATE (A) 11/29/2020 1321    Radiological Exams on Admission: CT ABDOMEN PELVIS WO CONTRAST  Result Date: 11/29/2020 CLINICAL DATA:  RLQ abdominal pain EXAM: CT ABDOMEN AND PELVIS WITHOUT CONTRAST TECHNIQUE: Multidetector CT imaging of the abdomen and pelvis was performed following the standard protocol without IV contrast. COMPARISON:  None. FINDINGS: Lower chest: Bilateral lower lung scarring and fibrotic change. Markedly dilated right atrium. Mitral  annular calcifications. Hepatobiliary: Mild periportal edema likely due to congestion. No focal liver lesion. Prior cholecystectomy. Pancreas: Unremarkable. No pancreatic ductal dilatation or surrounding inflammatory changes. Spleen: Normal in size without focal abnormality. Adrenals/Urinary Tract: Nodular left adrenal thickening with density less than 10 ounce 40 units measuring 1.2 cm, cystically likely to be an adenoma. No hydronephrosis. There is a left lower pole hypodense lesion which is likely a cyst. The bladder is moderately distended but unremarkable. Stomach/Bowel: The stomach is within normal limits. There is no evidence of bowel obstruction. There is extensive colonic diverticulosis. There is no evidence of appendicitis. Vascular/Lymphatic: Extensive aorto bi-iliac atherosclerotic calcifications. No AAA. There is no lymphadenopathy. Reproductive: Endometrial thickening. Other: There is a periumbilical hernia containing a long segment of transverse colon, which has extensive diverticuli. Musculoskeletal: Prior left femoral neck fixation. There is no acute osseous abnormality. There is moderate right hip osteoarthritis. Trace anterolisthesis at L4-L5. There are broad-based disc bulges notable throughout the lumbar spine with multilevel facet arthritis. This likely results in varying degrees of mild to moderate spinal canal and neural foraminal narrowing. IMPRESSION: No acute abdominopelvic abnormality. Extensive colonic diverticulosis.  No evidence of diverticulitis. Periumbilical hernia containing a long segment of transverse colon without evidence of obstruction. Evidence of endometrial thickening. Correlate with any history of abnormal uterine bleeding and recommend non-emergent pelvic ultrasound Markedly dilated right atrium. Electronically Signed   By: Maurine Simmering   On: 11/29/2020 14:50   CT Hip Right Wo Contrast  Result Date: 11/29/2020 CLINICAL DATA:  Right hip pain for the past month.  No  injury. EXAM: CT OF THE RIGHT HIP WITHOUT CONTRAST TECHNIQUE: Multidetector CT imaging of the right hip was performed according to the standard protocol. Multiplanar CT image reconstructions were also generated. COMPARISON:  Right hip x-rays from same day. MRI right hip dated February 19, 2019. FINDINGS: Bones/Joint/Cartilage No fracture or dislocation. Unchanged moderate right hip osteoarthritis. Unchanged small right hip joint effusion. Ligaments Ligaments are suboptimally evaluated by CT. Muscles and Tendons Grossly intact. New small amount of fluid in the right iliopsoas bursa (series 4, image 38). Unchanged right gluteus minimus muscle atrophy. Soft tissue No fluid collection or hematoma.  No soft tissue mass. IMPRESSION: 1.  No acute osseous abnormality. 2. New mild right iliopsoas bursitis. 3. Unchanged moderate right hip osteoarthritis with small joint effusion. Electronically Signed   By: Titus Dubin M.D.   On: 11/29/2020 14:16   DG Hip Unilat W or Wo Pelvis 2-3 Views Right  Result Date: 11/29/2020 CLINICAL DATA:  Right hip pain.  No known injury. EXAM:  DG HIP (WITH OR WITHOUT PELVIS) 2-3V RIGHT COMPARISON:  Plain films right hip 08/24/2019. FINDINGS: There is no acute bony or joint abnormality. Moderate to moderately severe right hip osteoarthritis is unchanged in appearance. The patient has undergone fixation of a left subcapital fracture since the prior study. No focal bony lesion. Large appearing colonic stool burden is partially imaged. IMPRESSION: No acute abnormality. No change in right hip osteoarthritis. Status post fixation of a left subcapital fracture. Electronically Signed   By: Inge Rise M.D.   On: 11/29/2020 12:12     I reviewed all nursing notes, pharmacy notes, vitals, pertinent old records.  Assessment/Plan Principal Problem:   Sepsis ([place]) Active Problems:   HTN (hypertension)   Acute lower UTI   Atrial fibrillation with RVR (HCC)   Right hip pain   # Sepsis  secondary to UTI, presented with hypotension, tachycardia, leukocytosis, UA positive, Lactic acid within normal range Patient was given IV fluid in the ED, blood pressure seems to be stable now Continue IV fluid for hydration Continue ceftriaxone 1 g IV daily Follow urine culture and blood culture   # Right hip pain could be secondary to iliopsoas bursitis and osteoarthritis CT scan right hip reviewed Started Solu-Medrol 40 every 12 hourly Toradol 15 mg x 1 dose given Continue oral Percocet and Tylenol as needed PT and OT eval We will orthopedic consult tomorrow a.m. if no improvement  # Hypertension, HLD, chronic A. Fib Resumed Eliquis Resume Cardizem tomorrow a.m. with holding parameters Continue monitor BP and titrate medications accordingly   # Dementia, continue supportive care  # Rheumatoid arthritis, held medications for now due active infection   Nutrition: Cardiac diet DVT Prophylaxis: Therapeutic Anticoagulation with Eliquis  Advance goals of care discussion: Limited code as per previous documentation  Consults: We will consider orthopedic consult tomorrow a.m.    Family Communication: family was not present at bedside, at the time of interview.  Opportunity was given to ask question and all questions were answered satisfactorily.  Disposition: Admitted as inpatient, med-surge unit. Likely to be discharged home, in 2-3  days.  I have discussed plan of care as described above with RN and patient/family.  Severity of Illness: The appropriate patient status for this patient is INPATIENT. Inpatient status is judged to be reasonable and necessary in order to provide the required intensity of service to ensure the patient's safety. The patient's presenting symptoms, physical exam findings, and initial radiographic and laboratory data in the context of their chronic comorbidities is felt to place them at high risk for further clinical deterioration. Furthermore, it is  not anticipated that the patient will be medically stable for discharge from the hospital within 2 midnights of admission. The following factors support the patient status of inpatient.   " The patient's presenting symptoms include sepsis. " The worrisome physical exam findings include low BP. " The initial radiographic and laboratory data are worrisome because of sepsis. " The chronic co-morbidities include right hip pain.   * I certify that at the point of admission it is my clinical judgment that the patient will require inpatient hospital care spanning beyond 2 midnights from the point of admission due to high intensity of service, high risk for further deterioration and high frequency of surveillance required.*   Author: Val Riles, MD [facility] 11/29/2020 5:24 PM   To reach On-call, see care teams to locate the attending and reach out to them via [email or web address]. If 7PM-7AM, please contact night-coverage If you  still have difficulty reaching the attending provider, please page the [facility] (Director on Call) for [facility] on amion for assistance.

## 2020-11-30 DIAGNOSIS — A419 Sepsis, unspecified organism: Principal | ICD-10-CM

## 2020-11-30 LAB — CBC
HCT: 41.6 % (ref 36.0–46.0)
Hemoglobin: 13.9 g/dL (ref 12.0–15.0)
MCH: 34.8 pg — ABNORMAL HIGH (ref 26.0–34.0)
MCHC: 33.4 g/dL (ref 30.0–36.0)
MCV: 104.3 fL — ABNORMAL HIGH (ref 80.0–100.0)
Platelets: 191 10*3/uL (ref 150–400)
RBC: 3.99 MIL/uL (ref 3.87–5.11)
RDW: 15.5 % (ref 11.5–15.5)
WBC: 9.5 10*3/uL (ref 4.0–10.5)
nRBC: 0 % (ref 0.0–0.2)

## 2020-11-30 LAB — GLUCOSE, CAPILLARY: Glucose-Capillary: 140 mg/dL — ABNORMAL HIGH (ref 70–99)

## 2020-11-30 LAB — BASIC METABOLIC PANEL
Anion gap: 7 (ref 5–15)
BUN: 25 mg/dL — ABNORMAL HIGH (ref 8–23)
CO2: 24 mmol/L (ref 22–32)
Calcium: 8.6 mg/dL — ABNORMAL LOW (ref 8.9–10.3)
Chloride: 106 mmol/L (ref 98–111)
Creatinine, Ser: 0.87 mg/dL (ref 0.44–1.00)
GFR, Estimated: >60 mL/min (ref >60)
Glucose, Bld: 133 mg/dL — ABNORMAL HIGH (ref 70–99)
Potassium: 5 mmol/L (ref 3.5–5.1)
Sodium: 137 mmol/L (ref 135–145)

## 2020-11-30 LAB — PHOSPHORUS: Phosphorus: 3.4 mg/dL (ref 2.5–4.6)

## 2020-11-30 LAB — MAGNESIUM: Magnesium: 1.6 mg/dL — ABNORMAL LOW (ref 1.7–2.4)

## 2020-11-30 MED ORDER — MAGNESIUM SULFATE 2 GM/50ML IV SOLN
2.0000 g | Freq: Once | INTRAVENOUS | Status: AC
  Administered 2020-11-30: 2 g via INTRAVENOUS
  Filled 2020-11-30: qty 50

## 2020-11-30 NOTE — Evaluation (Signed)
Occupational Therapy Evaluation Patient Details Name: Angela Leonard MRN: [id number] DOB: 30-May-1934 Today's Date: 11/30/2020    History of Present Illness Martyna Thorns is a 86 y.o. female with medical history significant of hypertension, hyperlipidemia, atrial fibrillation on Eliquis, mild dementia with sundowning phenomena, rheumatoid arthritis on methotrexate, hx L hip pinning after fall (3/22), who presents RLQ abdominal pain and R hip pain and trouble putting weight on R leg.   Clinical Impression   Pt was seen for OT evaluation this date. Pt pleasantly confused, oriented to self but easily reoriented. Pt able to follow simple commands. Pt unable to provide much PLOF/home set up, but OT was able to speak with her daughter on the phone, whom she lives with. Per her daughter, the pt was ambulating with a walker with supervision assist at home from the daughter and caregivers (24/7 care). She required minimal assist for seated shower. No recent falls, but does have a history of falls. Currently pt demonstrates impairments in as described below (See OT problem list) which result in increased assist for ADL and mobility. Pt would benefit from skilled OT services to address noted impairments and functional limitations (see below for any additional details) in order to maximize safety and independence while minimizing falls risk and caregiver burden. Upon hospital discharge, recommend [place] and continued 24/7 supervision to maximize pt safety and return to functional independence during meaningful occupations of daily life.    Follow Up Recommendations  Home health OT;Supervision/Assistance - 24 hour    Equipment Recommendations  None recommended by OT    Recommendations for Other Services       Precautions / Restrictions Precautions Precautions: Fall Restrictions Weight Bearing Restrictions: No      Mobility Bed Mobility               General bed mobility comments: able to  reposition in bed wihtout assist    Transfers                 General transfer comment: deferred, no IV pole    Balance                                           ADL either performed or assessed with clinical judgement   ADL Overall ADL's : Needs assistance/impaired                                       General ADL Comments: Pt able to perform LB ADL with PRN MIN A, will assess ADL transfers next session     Vision         Perception     Praxis      Pertinent Vitals/Pain Pain Assessment: Faces Faces Pain Scale: Hurts even more Pain Location: R hip with AAROM Pain Descriptors / Indicators: Grimacing;Guarding Pain Intervention(s): Limited activity within patient's tolerance;Monitored during session;Repositioned     Hand Dominance Right   Extremity/Trunk Assessment Upper Extremity Assessment Upper Extremity Assessment: Overall WFL for tasks assessed   Lower Extremity Assessment Lower Extremity Assessment: RLE deficits/detail RLE Deficits / Details: R hip painful with movement, is able to reach her feet wiht forward fold       Communication Communication Communication: No difficulties   Cognition Arousal/Alertness: Awake/alert Behavior During Therapy: WFL for tasks assessed/performed Overall Cognitive  Status: History of cognitive impairments - at baseline                                 General Comments: pt pleasantly confused, follows commands well, oriented to self   General Comments       Exercises     Shoulder Instructions      Home Living Family/patient expects to be discharged to:: Private residence Living Arrangements: Children Available Help at Discharge: Family Type of Home: House Home Access: Stairs to enter;Ramped entrance Entrance Stairs-Number of Steps: [place]: One level     Bathroom Shower/Tub: Tub/shower unit;Walk-in shower   Bathroom Toilet: Standard     Home  Equipment: Clinical cytogeneticist - 2 wheels;Toilet riser   Additional Comments: PLOF/home set up per dtr, pt is a questionable historian      Prior Functioning/Environment Level of Independence: Needs assistance  Gait / Transfers Assistance Needed: per dtr pt was using walker for short household distances, slow, with someone behind her just in case, caretaker was working with her on exercises ADL's / Homemaking Assistance Needed: per dtr, pt stays in nightgown all the time, caregivers help her bathing, can do toileting without assist   Comments: caregiver 9am-2pm, and another caregiver overnight, dtr there all the time        OT Problem List: Pain;Decreased strength      OT Treatment/Interventions: Self-care/ADL training;Therapeutic exercise;Therapeutic activities;DME and/or AE instruction;Patient/family education;Cognitive remediation/compensation    OT Goals(Current goals can be found in the care plan section) Acute Rehab OT Goals Patient Stated Goal: get a cocktail OT Goal Formulation: With patient Time For Goal Achievement: 12/14/20 Potential to Achieve Goals: Good ADL Goals Pt Will Perform Lower Body Dressing: with caregiver independent in assisting;sit to/from stand Pt Will Transfer to Toilet: ambulating;bedside commode;with min assist Additional ADL Goal #1: Pt will perform bed mobility with supervision for safety.  OT Frequency: Min 1X/week   Barriers to D/C:            Co-evaluation              AM-PAC OT "6 Clicks" Daily Activity     Outcome Measure Help from another person eating meals?: None Help from another person taking care of personal grooming?: None Help from another person toileting, which includes using toliet, bedpan, or urinal?: A Little Help from another person bathing (including washing, rinsing, drying)?: A Little Help from another person to put on and taking off regular upper body clothing?: None Help from another person to put on and taking  off regular lower body clothing?: A Little 6 Click Score: 21   End of Session    Activity Tolerance: Patient tolerated treatment well Patient left: in bed;with call [place]/phone within reach;with bed alarm set  OT Visit Diagnosis: Other abnormalities of gait and mobility (R26.89);Pain Pain - Right/Left: Right Pain - part of body: Hip                Time: 3810-1751 OT Time Calculation (min): 16 min Charges:  OT General Charges $OT Visit: 1 Visit OT Evaluation $OT Eval Low Complexity: 1 Low  Hanley Hays, MPH, MS, OTR/L ascom [phone] 11/30/20, 4:28 PM

## 2020-11-30 NOTE — Progress Notes (Addendum)
PROGRESS NOTE    Angela Leonard  SWN:[id number] DOB: 1935-02-28 DOA: 11/29/2020 PCP: [facility]   Chief Complaint  Patient presents with   Hip Pain    Brief Narrative: 86 year old female with PMH of Chronic Dementia, HTN, HL, RA (on MTX), Chronic Afib (on Eliquis), and Physical Deconditioning with Recurrent Falls who presents to the ED on 7/6 with .  On 3/18, patient was admitted after a mechanical fall and found to have a left femoral neck fracture s/p 3/19 pinning procedure (Dr. Sabra Heck).  Subjective: Mental status is at baseline. Right Hip Tenderness to Palpation, full ROM, pulses intact. Abdominal exam is benign. Denies any chest pain or shortness of breath. Lesion on forehead noted.   Assessment & Plan: Principal Problem:   Sepsis ([place]) Active Problems:   HTN (hypertension)   Acute lower UTI   Atrial fibrillation with RVR (HCC)   Right hip pain   Acute Right Hip Pain - secondary to Moderate Osteoarthritis / Hip Joint Degenerative Disease: There does not appear to be a septic joint.  CT of the Hip showed no fracture or acute abnormality. - Discontinue steroids.  Already on MTX. - Follow up with Ortho outpatient as needed. - Consult PT and discharge tomorrow based on recommendations.  RLQ pain secondary to Iliopsosas Bursitis: CT of abdomen shows periumbilical hernia and no bowel obstruction and CT of hip shows small amount of fluid in right hip. - Monitor.  If pain does not improve, this may need aspiration.  Uncomplicated Cystitis: UA shows moderate leuks, nitrites, and 21-50 WBCs. - This is Day 2 of IV Ceftriaxone.   Treat for short course.  Hypomagnesium: - Mg 1.6 mg/dL, replaced.  Macrocytosis - due to CTD: - Monitor.  Rheumatoid Arthritis: - Restart home meds on discharge.  Chronic Lung Disease: - Inhalers PRN.  Atrial Fibrillation: - Diltiazem for rate control. - Eliquis for anticoagulation.  Extensive Atherosclerotic Disease: - Eliquis  and Statin.  Cholecystectomy Spine Surgery at L4-L5  Diet Order             Diet Heart Room service appropriate? Yes; Fluid consistency: Thin  Diet effective now                         Patient's Body mass index is 27.44 kg/m.      DVT prophylaxis: apixaban (ELIQUIS) tablet 2.5 mg Start: 11/29/20 2200 SCDs Start: 11/29/20 1917 Code Status:   Code Status: Partial Code  Family Communication: plan of care discussed with patient at bedside.  Status is: Inpatient  Remains inpatient appropriate because:Inpatient level of care appropriate due to severity of illness  Dispo: The patient is from: Home              Anticipated d/c is to: Home Hopefully discharge home tomorrow per PT recs.              Patient currently is not medically stable to d/c.   Difficult to place patient No       Unresulted Labs (From admission, onward)     Start     Ordered   11/30/20 0723  SARS CORONAVIRUS 2 (TAT 6-24 HRS) Nasopharyngeal Nasopharyngeal Swab  (Tier 3 - Symptomatic/asymptomatic)  Once,   R       Question Answer Comment  Is this test for diagnosis or screening Screening   Symptomatic for COVID-19 as defined by CDC No   Hospitalized for COVID-19 No   Admitted to  ICU for COVID-19 No   Previously tested for COVID-19 Yes   Resident in a congregate (group) care setting Unknown   Employed in healthcare setting No   Pregnant No   Has patient completed COVID vaccination(s) (2 doses of Pfizer/Moderna 1 dose of [employer]) Yes   Has patient completed COVID Booster / 3rd dose Unknown      11/30/20 0723   11/30/20 0500  CBC  Daily,   STAT      11/29/20 1636   11/30/20 4259  Basic metabolic panel  Daily,   STAT      11/29/20 1636   11/30/20 0500  Magnesium  Daily,   STAT      11/29/20 1636   11/30/20 0500  Phosphorus  Daily,   STAT      11/29/20 1636            Medications reviewed:  Scheduled Meds:  apixaban  2.5 mg Oral Q12H   diltiazem  180 mg Oral Daily    methylPREDNISolone (SOLU-MEDROL) injection  40 mg Intravenous Q12H   polyethylene glycol  17 g Oral Daily   sodium chloride flush  3 mL Intravenous Q12H   Continuous Infusions:  sodium chloride     sodium chloride 75 mL/hr at 11/30/20 0604   cefTRIAXone (ROCEPHIN)  IV 1 g (11/30/20 1551)    Consultants:see note  Procedures:see note  Antimicrobials: Anti-infectives (From admission, onward)    Start     Dose/Rate Route Frequency Ordered Stop   11/29/20 1530  cefTRIAXone (ROCEPHIN) 1 g in sodium chloride 0.9 % 100 mL IVPB        1 g 200 mL/hr over 30 Minutes Intravenous Every 24 hours 11/29/20 1521        Culture/Microbiology    Component Value Date/Time   SDES BLOOD BLOOD LEFT ARM 11/29/2020 1551   SDES BLOOD BLOOD LEFT WRIST 11/29/2020 1551   SPECREQUEST  11/29/2020 1551    BOTTLES DRAWN AEROBIC AND ANAEROBIC Blood Culture adequate volume   SPECREQUEST  11/29/2020 1551    BOTTLES DRAWN AEROBIC AND ANAEROBIC Blood Culture adequate volume   CULT  11/29/2020 1551    NO GROWTH < 24 HOURS Performed at [facility], [address] [place], [place] [address]    CULT  11/29/2020 1551    NO GROWTH < 24 HOURS Performed at [facility], [address], [place], [place] [address]    REPTSTATUS PENDING 11/29/2020 1551   REPTSTATUS PENDING 11/29/2020 1551    Other culture-see note  Objective: Vitals: Today's Vitals   11/30/20 0355 11/30/20 0755 11/30/20 1233 11/30/20 1542  BP: 128/73 125/61 113/66 109/63  Pulse: 81 78 83 72  Resp: 19 18 16 18   Temp: 98.5 F (36.9 C) 97.6 F (36.4 C) 98.7 F (37.1 C) 98 F (36.7 C)  TempSrc:  Oral Oral   SpO2: 97% 97% 98% 95%  Weight:      Height:      PainSc:   0-No pain     Intake/Output Summary (Last 24 hours) at 11/30/2020 1641 Last data filed at 11/30/2020 1458 Gross per 24 hour  Intake 996.37 ml  Output 400 ml  Net 596.37 ml   Filed Weights   11/29/20 1114  Weight: 74.8 kg   Weight change:    Intake/Output from previous day: 07/06 0701 - 07/07 0700 In: 2976.4 [I.V.:876.4; IV Piggyback:2100] Out: 400 [Urine:400] Intake/Output this shift: Total I/O In: 120 [P.O.:120] Out: -  [employer]  11/29/20 1114  Weight: 74.8 kg    Examination: General exam: alert and oriented, mental status intact, appears a little fatigued and deconditioned HEENT: NCAT, PERRL Respiratory system: CTAB no WRR Cardiovascular system: Did not appreciate a murmur, irregular rhythm, No JVD. Gastrointestinal system: No flank pain, Abdomen soft, NT,ND, BS+. Nervous System: No focal deficits. Extremities: Left Hip TTP, full ROM, No edema, distal peripheral pulses palpable.  Skin: Large lesion on forehead - possible BCC. MSK: Physical Deconditioning   Data Reviewed: I have personally reviewed following labs and imaging studies CBC: Recent Labs  Lab 11/29/20 1321 11/30/20 0609  WBC 16.1* 9.5  NEUTROABS 12.5*  --   HGB 14.4 13.9  HCT 43.3 41.6  MCV 105.6* 104.3*  PLT 177 297   Basic Metabolic Panel: Recent Labs  Lab 11/29/20 1321 11/30/20 0609  NA 135 137  K 4.1 5.0  CL 105 106  CO2 22 24  GLUCOSE 119* 133*  BUN 29* 25*  CREATININE 0.91 0.87  CALCIUM 9.2 8.6*  MG  --  1.6*  PHOS  --  3.4    Sepsis Labs: Recent Labs  Lab 11/29/20 1551 11/29/20 1833  LATICACIDVEN 1.1 0.8    Recent Results (from the past 240 hour(s))  Culture, blood (routine x 2)     Status: None (Preliminary result)   Collection Time: 11/29/20  3:51 PM   Specimen: BLOOD  Result Value Ref Range Status   Specimen Description BLOOD BLOOD LEFT ARM  Final   Special Requests   Final    BOTTLES DRAWN AEROBIC AND ANAEROBIC Blood Culture adequate volume   Culture   Final    NO GROWTH < 24 HOURS Performed at [facility], [address] [place], [place] [address]    Report Status PENDING  Incomplete  Culture, blood (routine x 2)     Status: None (Preliminary result)   Collection Time: 11/29/20  3:51  PM   Specimen: BLOOD  Result Value Ref Range Status   Specimen Description BLOOD BLOOD LEFT WRIST  Final   Special Requests   Final    BOTTLES DRAWN AEROBIC AND ANAEROBIC Blood Culture adequate volume   Culture   Final    NO GROWTH < 24 HOURS Performed at [facility], [address] [place], [place] [address]    Report Status PENDING  Incomplete     Radiology Studies: CT ABDOMEN PELVIS WO CONTRAST  Result Date: 11/29/2020 CLINICAL DATA:  RLQ abdominal pain EXAM: CT ABDOMEN AND PELVIS WITHOUT CONTRAST TECHNIQUE: Multidetector CT imaging of the abdomen and pelvis was performed following the standard protocol without IV contrast. COMPARISON:  None. FINDINGS: Lower chest: Bilateral lower lung scarring and fibrotic change. Markedly dilated right atrium. Mitral annular calcifications. Hepatobiliary: Mild periportal edema likely due to congestion. No focal liver lesion. Prior cholecystectomy. Pancreas: Unremarkable. No pancreatic ductal dilatation or surrounding inflammatory changes. Spleen: Normal in size without focal abnormality. Adrenals/Urinary Tract: Nodular left adrenal thickening with density less than 10 ounce 40 units measuring 1.2 cm, cystically likely to be an adenoma. No hydronephrosis. There is a left lower pole hypodense lesion which is likely a cyst. The bladder is moderately distended but unremarkable. Stomach/Bowel: The stomach is within normal limits. There is no evidence of bowel obstruction. There is extensive colonic diverticulosis. There is no evidence of appendicitis. Vascular/Lymphatic: Extensive aorto bi-iliac atherosclerotic calcifications. No AAA. There is no lymphadenopathy. Reproductive: Endometrial thickening. Other: There is a periumbilical hernia containing a long segment of transverse colon, which  has extensive diverticuli. Musculoskeletal: Prior left femoral neck fixation. There is no acute osseous abnormality. There is moderate right hip osteoarthritis. Trace  anterolisthesis at L4-L5. There are broad-based disc bulges notable throughout the lumbar spine with multilevel facet arthritis. This likely results in varying degrees of mild to moderate spinal canal and neural foraminal narrowing. IMPRESSION: No acute abdominopelvic abnormality. Extensive colonic diverticulosis.  No evidence of diverticulitis. Periumbilical hernia containing a long segment of transverse colon without evidence of obstruction. Evidence of endometrial thickening. Correlate with any history of abnormal uterine bleeding and recommend non-emergent pelvic ultrasound Markedly dilated right atrium. Electronically Signed   By: Maurine Simmering   On: 11/29/2020 14:50   CT Hip Right Wo Contrast  Result Date: 11/29/2020 CLINICAL DATA:  Right hip pain for the past month.  No injury. EXAM: CT OF THE RIGHT HIP WITHOUT CONTRAST TECHNIQUE: Multidetector CT imaging of the right hip was performed according to the standard protocol. Multiplanar CT image reconstructions were also generated. COMPARISON:  Right hip x-rays from same day. MRI right hip dated February 19, 2019. FINDINGS: Bones/Joint/Cartilage No fracture or dislocation. Unchanged moderate right hip osteoarthritis. Unchanged small right hip joint effusion. Ligaments Ligaments are suboptimally evaluated by CT. Muscles and Tendons Grossly intact. New small amount of fluid in the right iliopsoas bursa (series 4, image 38). Unchanged right gluteus minimus muscle atrophy. Soft tissue No fluid collection or hematoma.  No soft tissue mass. IMPRESSION: 1.  No acute osseous abnormality. 2. New mild right iliopsoas bursitis. 3. Unchanged moderate right hip osteoarthritis with small joint effusion. Electronically Signed   By: Titus Dubin M.D.   On: 11/29/2020 14:16   DG Hip Unilat W or Wo Pelvis 2-3 Views Right  Result Date: 11/29/2020 CLINICAL DATA:  Right hip pain.  No known injury. EXAM: DG HIP (WITH OR WITHOUT PELVIS) 2-3V RIGHT COMPARISON:  Plain films right  hip 08/24/2019. FINDINGS: There is no acute bony or joint abnormality. Moderate to moderately severe right hip osteoarthritis is unchanged in appearance. The patient has undergone fixation of a left subcapital fracture since the prior study. No focal bony lesion. Large appearing colonic stool burden is partially imaged. IMPRESSION: No acute abnormality. No change in right hip osteoarthritis. Status post fixation of a left subcapital fracture. Electronically Signed   By: Inge Rise M.D.   On: 11/29/2020 12:12     LOS: 1 day   George Hugh, MD [facility]  11/30/2020, 4:41 PM

## 2020-11-30 NOTE — Plan of Care (Signed)

## 2020-11-30 NOTE — Progress Notes (Signed)
Met with the patient to discuss DC plan and needs She is confused, I called to speak with her daughter that she lives with, I spoke with [facility] the patients' home care worker, the patient has 24 7 home care with a trained worker, 3 shifts per day, she has DME at home and does not need additional, the patient has transportation and can afford her medications Will continue to monitor for needs

## 2020-11-30 NOTE — Plan of Care (Signed)
  Problem: Education: Goal: Knowledge of General Education information will improve Description: Including pain rating scale, medication(s)/side effects and non-pharmacologic comfort measures Outcome: Progressing   Problem: Health Behavior/Discharge Planning: Goal: Ability to manage health-related needs will improve Outcome: Progressing   Problem: Clinical Measurements: Goal: Ability to maintain clinical measurements within normal limits will improve Outcome: Progressing Goal: Will remain free from infection Outcome: Progressing Goal: Diagnostic test results will improve Outcome: Progressing Goal: Respiratory complications will improve Outcome: Progressing Goal: Cardiovascular complication will be avoided Outcome: Progressing   Problem: Activity: Goal: Risk for activity intolerance will decrease Outcome: Progressing   Problem: Nutrition: Goal: Adequate nutrition will be maintained Outcome: Progressing   Problem: Coping: Goal: Level of anxiety will decrease Outcome: Progressing   Problem: Elimination: Goal: Will not experience complications related to bowel motility Outcome: Progressing Goal: Will not experience complications related to urinary retention Outcome: Progressing   Problem: Safety: Goal: Ability to remain free from injury will improve Outcome: Progressing   Problem: Skin Integrity: Goal: Risk for impaired skin integrity will decrease Outcome: Progressing   Problem: Fluid Volume: Goal: Hemodynamic stability will improve Outcome: Progressing   Problem: Clinical Measurements: Goal: Diagnostic test results will improve Outcome: Progressing Goal: Signs and symptoms of infection will decrease Outcome: Progressing   Problem: Respiratory: Goal: Ability to maintain adequate ventilation will improve Outcome: Progressing

## 2020-11-30 NOTE — Evaluation (Signed)
Physical Therapy Evaluation Patient Details Name: Angela Leonard MRN: [id number] DOB: 29-May-1934 Today's Date: 11/30/2020   History of Present Illness  Angela Leonard is a 86 y.o. female with medical history significant of hypertension, hyperlipidemia, atrial fibrillation on Eliquis, mild dementia with sundowning phenomena, rheumatoid arthritis on methotrexate, hx L hip pinning after fall (3/22), who presents RLQ abdominal pain and R hip pain and trouble putting weight on R leg.  Admitted for management of sepsis related to UTI and R hip pain.  Imaging significant for R iliopsoas bursitis and small joint effusion; ortho consult pending.  Clinical Impression  Patient resting in bed upon arrival, attempting to utilize call Badal to communicate need for toileting (but unable to effectively use call Zubiate at this time).  Alert and oriented to self only; follows commands, very pleasant and cooperative session.  Demonstrates pain rating 4/10 (per FACES scale) in R hip, but voices need for toileting and requests OOB attempts with therapist. Demonstrates R hip strength at least 3-/5, tolerating active movement throughout functional range of motion; does self-assist with bilat LEs as needed.  Currently able to complete bed mobility with close sup; sit/stand, basic transfers and gait (5' x2) with RW, min assist.  Demonstrates shuffling stepping pattern; limited tolerance for loading/WBing R LE, unable to demonstrate R foot flat in loading phases of gait. Slow and deliberate, but no overt buckling or LOB.  Distance limited by pain in R LE.  Will continue to assess/progress in subsequent sessions as appropriate. Would benefit from skilled PT to address above deficits and promote optimal return to PLOF.;Recommend transition to HHPT upon discharge from acute hospitalization.      Follow Up Recommendations Home health PT    Equipment Recommendations  3in1 (PT);Rolling walker with 5" wheels    Recommendations for  Other Services       Precautions / Restrictions Precautions Precautions: Fall Restrictions Weight Bearing Restrictions: No      Mobility  Bed Mobility Overal bed mobility: Needs Assistance Bed Mobility: Supine to Sit;Sit to Supine     Supine to sit: Supervision Sit to supine: Supervision   General bed mobility comments: self-assisting R LE in/out of bed without physical assist from therapist    Transfers Overall transfer level: Needs assistance Equipment used: Rolling walker (2 wheeled) Transfers: Sit to/from Stand Sit to Stand: Min assist         General transfer comment: limited awareness/integration of cuing for hand placement; assist for lift off, balance  Ambulation/Gait Ambulation/Gait assistance: Min assist Gait Distance (Feet):  (5' x2) Assistive device: Rolling walker (2 wheeled)       General Gait Details: shuffling stepping pattern; limited tolerance for loading/WBing R LE, unable to demonstrate R foot flat in loading phases of gait. Slow and deliberate, but no overt buckling or LOB.  Distance limited by pain in R LE  Stairs            Wheelchair Mobility    Modified Rankin (Stroke Patients Only)       Balance Overall balance assessment: Needs assistance Sitting-balance support: No upper extremity supported;Feet supported Sitting balance-[name] Scale: Good     Standing balance support: Bilateral upper extremity supported Standing balance-[name] Scale: Fair                               Pertinent Vitals/Pain Pain Assessment: Faces Faces Pain Scale: Hurts little more Pain Location: R hip with movement/WBing Pain Descriptors /  Indicators: Guarding;Grimacing Pain Intervention(s): Limited activity within patient's tolerance;Monitored during session;Repositioned    Home Living Family/patient expects to be discharged to:: Private residence Living Arrangements: Children Available Help at Discharge: Family Type of Home:  House Home Access: Stairs to enter;Ramped entrance Entrance Stairs-Rails: None Entrance Stairs-Number of Steps: 1 Home Layout: One level Home Equipment: Clinical cytogeneticist - 2 wheels;Toilet riser Additional Comments: PLOF/home set up per dtr, pt is a questionable historian    Prior Function Level of Independence: Needs assistance   Gait / Transfers Assistance Needed: per dtr pt was using walker for short household distances, slow, with someone behind her just in case, caretaker was working with her on exercises  ADL's / Homemaking Assistance Needed: per dtr, pt stays in nightgown all the time, caregivers help her bathing, can do toileting without assist  Comments: caregiver 9am-2pm, and another caregiver overnight, dtr there all the time     Hand Dominance   Dominant Hand: Right    Extremity/Trunk Assessment   Upper Extremity Assessment Upper Extremity Assessment: Overall WFL for tasks assessed    Lower Extremity Assessment Lower Extremity Assessment: Generalized weakness (R LE grossly 3-/5, limited by pain (self-assisting R hip flexion/movement as needed); L LE grossly 4/5 throughout) RLE Deficits / Details: R hip painful with movement, is able to reach her feet wiht forward fold       Communication   Communication: No difficulties  Cognition Arousal/Alertness: Awake/alert Behavior During Therapy: WFL for tasks assessed/performed Overall Cognitive Status: History of cognitive impairments - at baseline                                 General Comments: pt pleasantly confused, follows commands well, oriented to self; able to effectively make needs known      General Comments      Exercises Other Exercises Other Exercises: Toilet transfer, SPT with RW, min assist; sit/stand from [facility] with RW, min assist; standing balance with RW, min assist.   Assessment/Plan    PT Assessment Patient needs continued PT services  PT Problem List Decreased  strength;Decreased range of motion;Decreased activity tolerance;Decreased balance;Decreased mobility;Decreased knowledge of use of DME;Decreased safety awareness;Decreased knowledge of precautions;Pain;Decreased cognition       PT Treatment Interventions DME instruction;Gait training;Stair training;Functional mobility training;Therapeutic activities;Therapeutic exercise;Balance training;Patient/family education    PT Goals (Current goals can be found in the Care Plan section)  Acute Rehab PT Goals Patient Stated Goal: get a cocktail PT Goal Formulation: With patient Time For Goal Achievement: 12/14/20 Potential to Achieve Goals: Good    Frequency Min 2X/week   Barriers to discharge        Co-evaluation               AM-PAC PT "6 Clicks" Mobility  Outcome Measure Help needed turning from your back to your side while in a flat bed without using bedrails?: None Help needed moving from lying on your back to sitting on the side of a flat bed without using bedrails?: None Help needed moving to and from a bed to a chair (including a wheelchair)?: A Little Help needed standing up from a chair using your arms (e.g., wheelchair or bedside chair)?: A Little Help needed to walk in hospital room?: A Little Help needed climbing 3-5 steps with a railing? : A Little 6 Click Score: 20    End of Session Equipment Utilized During Treatment: Gait belt Activity Tolerance: Patient tolerated  treatment well Patient left: in bed;with call Renzulli/phone within reach;with bed alarm set Nurse Communication: Mobility status PT Visit Diagnosis: Muscle weakness (generalized) (M62.81);Difficulty in walking, not elsewhere classified (R26.2)    Time: 8472-0721 PT Time Calculation (min) (ACUTE ONLY): 17 min   Charges:   PT Evaluation $PT Eval Moderate Complexity: 1 Mod PT Treatments $Therapeutic Activity: 8-22 mins       Winola Drum H. Owens Shark, PT, DPT, NCS 11/30/20, 4:38 PM [phone]

## 2020-12-01 LAB — CBC
HCT: 41.4 % (ref 36.0–46.0)
Hemoglobin: 14.1 g/dL (ref 12.0–15.0)
MCH: 34.8 pg — ABNORMAL HIGH (ref 26.0–34.0)
MCHC: 34.1 g/dL (ref 30.0–36.0)
MCV: 102.2 fL — ABNORMAL HIGH (ref 80.0–100.0)
Platelets: 228 10*3/uL (ref 150–400)
RBC: 4.05 MIL/uL (ref 3.87–5.11)
RDW: 15.6 % — ABNORMAL HIGH (ref 11.5–15.5)
WBC: 18.5 10*3/uL — ABNORMAL HIGH (ref 4.0–10.5)
nRBC: 0 % (ref 0.0–0.2)

## 2020-12-01 LAB — SARS CORONAVIRUS 2 (TAT 6-24 HRS): SARS Coronavirus 2: NEGATIVE

## 2020-12-01 LAB — BASIC METABOLIC PANEL
Anion gap: 5 (ref 5–15)
BUN: 41 mg/dL — ABNORMAL HIGH (ref 8–23)
CO2: 25 mmol/L (ref 22–32)
Calcium: 8.9 mg/dL (ref 8.9–10.3)
Chloride: 105 mmol/L (ref 98–111)
Creatinine, Ser: 0.91 mg/dL (ref 0.44–1.00)
GFR, Estimated: >60 mL/min (ref >60)
Glucose, Bld: 124 mg/dL — ABNORMAL HIGH (ref 70–99)
Potassium: 4.7 mmol/L (ref 3.5–5.1)
Sodium: 135 mmol/L (ref 135–145)

## 2020-12-01 LAB — PHOSPHORUS: Phosphorus: 2.6 mg/dL (ref 2.5–4.6)

## 2020-12-01 LAB — MAGNESIUM: Magnesium: 2 mg/dL (ref 1.7–2.4)

## 2020-12-01 MED ORDER — LUBIPROSTONE 24 MCG PO CAPS: 24.0000 ug | ORAL_CAPSULE | Freq: Two times a day (BID) | ORAL | 0 refills | Status: AC

## 2020-12-01 MED ORDER — CEFDINIR 300 MG PO CAPS: 300.0000 mg | ORAL_CAPSULE | Freq: Two times a day (BID) | ORAL | 0 refills | Status: AC

## 2020-12-01 NOTE — Plan of Care (Signed)
  Problem: Education: Goal: Knowledge of General Education information will improve Description: Including pain rating scale, medication(s)/side effects and non-pharmacologic comfort measures 12/01/2020 1522 by Trula Slade, RN Outcome: Adequate for Discharge 12/01/2020 1458 by Trula Slade, RN Outcome: Adequate for Discharge   Problem: Health Behavior/Discharge Planning: Goal: Ability to manage health-related needs will improve 12/01/2020 1522 by Trula Slade, RN Outcome: Adequate for Discharge 12/01/2020 1458 by Trula Slade, RN Outcome: Adequate for Discharge   Problem: Clinical Measurements: Goal: Ability to maintain clinical measurements within normal limits will improve 12/01/2020 1522 by Trula Slade, RN Outcome: Adequate for Discharge 12/01/2020 1458 by Trula Slade, RN Outcome: Adequate for Discharge Goal: Will remain free from infection 12/01/2020 1522 by Trula Slade, RN Outcome: Adequate for Discharge 12/01/2020 1458 by Trula Slade, RN Outcome: Adequate for Discharge Goal: Diagnostic test results will improve 12/01/2020 1522 by Trula Slade, RN Outcome: Adequate for Discharge 12/01/2020 1458 by Trula Slade, RN Outcome: Adequate for Discharge Goal: Respiratory complications will improve 12/01/2020 1522 by Trula Slade, RN Outcome: Adequate for Discharge 12/01/2020 1458 by Trula Slade, RN Outcome: Adequate for Discharge Goal: Cardiovascular complication will be avoided 12/01/2020 1522 by Trula Slade, RN Outcome: Adequate for Discharge 12/01/2020 1458 by Trula Slade, RN Outcome: Adequate for Discharge   Problem: Activity: Goal: Risk for activity intolerance will decrease 12/01/2020 1522 by Trula Slade, RN Outcome: Adequate for Discharge 12/01/2020 1458 by Trula Slade, RN Outcome: Adequate for Discharge   Problem: Nutrition: Goal: Adequate nutrition will be maintained 12/01/2020 1522 by Trula Slade,  RN Outcome: Adequate for Discharge 12/01/2020 1458 by Trula Slade, RN Outcome: Adequate for Discharge   Problem: Coping: Goal: Level of anxiety will decrease 12/01/2020 1522 by Trula Slade, RN Outcome: Adequate for Discharge 12/01/2020 1458 by Trula Slade, RN Outcome: Adequate for Discharge

## 2020-12-01 NOTE — Plan of Care (Signed)

## 2020-12-01 NOTE — Discharge Summary (Signed)
Physician Discharge Summary  Hayla Hinger TFT:[id number] DOB: 07-Dec-1934 DOA: 11/29/2020  [facility]P: [facility]  Admit date: 11/29/2020 Discharge date: 12/01/2020  Admitted From: Home with HH and 24 hour caregiver Disposition:  Home with HH and 24 hour caregiver  Recommendations for Outpatient Follow-up:  Follow up with Rheumatologist Dr. Posey Pronto in 1-2 weeks  Home Health: Continue PT Equipment/Devices: None  Discharge Condition: Stable Code Status:   Code Status: Partial Code Diet recommendation:  Diet Order             Diet - low sodium heart healthy           Diet Heart Room service appropriate? Yes; Fluid consistency: Thin  Diet effective now                    Brief/Interim Summary:  86 year old female with PMH of Chronic Dementia, HTN, HL, RA (on MTX), Chronic Afib (on Eliquis), and Physical Deconditioning with Recurrent Falls who presents to the ED on 7/6 with right hip pain.  CT showed no fracture, moderate osteoarthritis and moderate iliopsoas bursitis.  On 3/18, patient was admitted after a mechanical fall and found to have a left femoral neck fracture s/p 3/19 pinning procedure (Dr. Sabra Heck).  Acute Right Hip Pain - secondary to Moderate Osteoarthritis / Hip Joint Degenerative Disease: There does not appear to be a septic joint.  CT of the Hip showed no fracture or acute abnormality. - Patient received steroids on admission - which were discontinued.  She is already on MTX and does not need steroids. - Follow up with Rheumatologist Dr. Posey Pronto outpatient for management of pain. - Family has 24 hour supervision and Melvina set up - she is ok to discharge home.   RLQ pain secondary to Iliopsosas Bursitis: CT of abdomen shows periumbilical hernia and no bowel obstruction and CT of hip shows small amount of fluid in right hip. - Pain resolved with conservative management.   Uncomplicated Cystitis: UA shows moderate leuks, nitrites, and 21-50 WBCs. - Patient received 3 days  of IV antibiotics.  She was discharged on 4 days of Cefdinir 300 m g BID to complete 7 day course.   Hypomagnesium, replaced   Macrocytosis - due to CTD: - Monitor.   Rheumatoid Arthritis: - Continue home meds.   Chronic Lung Disease: - Inhalers PRN.   Atrial Fibrillation: - Diltiazem for rate control. - Eliquis for anticoagulation.   Extensive Atherosclerotic Disease: - Eliquis and Statin.   Cholecystectomy Spine Surgery at L4-L5    Discharge Diagnoses:  Principal Problem:   Sepsis ([place]) Active Problems:   HTN (hypertension)   Acute lower UTI   Atrial fibrillation with RVR (H[facility])   Right hip pain    Consults: None  Subjective: Patient was discharged in stable condition.  Discharge Exam: Vitals:   12/01/20 0825 12/01/20 1530  BP: 121/76 114/60  Pulse: 71 77  Resp: 17 16  Temp: (!) 97.4 F (36.3 C) 97.8 F (36.6 C)  SpO2: 97% 95%   General: Pt is alert, awake, not in acute distress Cardiovascular: RRR, S1/S2 +, no rubs, no gallops Respiratory: CTA bilaterally, no wheezing, no rhonchi Abdominal: Soft, NT, ND, bowel sounds + Extremities: no edema, no cyanosis  Discharge Instructions  Discharge Instructions     Ambulatory referral to Rheumatology   Complete by: As directed    Diet - low sodium heart healthy   Complete by: As directed    Discharge instructions   Complete by:  As directed    Please take Cefdinir 300 mg twice daily for 4 days to complete treatment of urinary tract infection.  Please take Amitiza 24 mcg twice daily for chronic constipation until you have regular bowel movements.  Stop taking medication if you experience diarrhea.  Please follow up with your regular Rheumatologist Dr. Posey Pronto regarding management of Right Hip Pain.  The pain is due to Right Hip Osteoarthritis Pain and Iliopsoas Bursitis.  Please continue Physical Therapy at home as tolerated.   Increase activity slowly   Complete by: As directed       Allergies as of  12/01/2020   No Known Allergies      Medication List     TAKE these medications    acetaminophen 325 MG tablet Commonly known as: TYLENOL Take 2 tablets (650 mg total) by mouth every 6 (six) hours as needed for mild pain or moderate pain (pain score 1-3 or temp > 100.5).   apixaban 2.5 MG Tabs tablet Commonly known as: ELIQUIS Take 2.5 mg by mouth 2 (two) times daily.   cefdinir 300 MG capsule Commonly known as: OMNICEF Take 1 capsule (300 mg total) by mouth 2 (two) times daily for 4 days.   cholecalciferol 1000 units tablet Commonly known as: VITAMIN D Take 1,000 Units by mouth daily.   diltiazem 180 MG 24 hr capsule Commonly known as: TIAZAC Take 180 mg by mouth daily.   ferrous sulfate 325 (65 FE) MG tablet Take 1 tablet (325 mg total) by mouth daily with breakfast.   folic acid 1 MG tablet Commonly known as: FOLVITE Take 1 mg by mouth daily.   HYDROcodone-acetaminophen 7.5-325 MG tablet Commonly known as: NORCO Take 1 tablet by mouth every 6 (six) hours as needed for severe pain (pain score 7-10).   lubiprostone 24 MCG capsule Commonly known as: Amitiza Take 1 capsule (24 mcg total) by mouth 2 (two) times daily with a meal for 7 days.   methotrexate 2.5 MG tablet Commonly known as: RHEUMATREX Take 7 tablets by mouth every Tuesday.   mirtazapine 15 MG tablet Commonly known as: REMERON Take 15 mg by mouth at bedtime.   multivitamin-lutein Caps capsule Take 1 capsule by mouth 2 (two) times daily.   ramipril 5 MG capsule Commonly known as: ALTACE Take 5 mg by mouth daily.   traZODone 50 MG tablet Commonly known as: DESYREL Take 0.5 tablets (25 mg total) by mouth at bedtime as needed for sleep.   vitamin B-12 1000 MCG tablet Commonly known as: CYANOCOBALAMIN Take 1,000 mcg by mouth daily.        No Known Allergies  The results of significant diagnostics from this hospitalization (including imaging, microbiology, ancillary and laboratory) are  listed below for reference.    Microbiology: Recent Results (from the past 240 hour(s))  Culture, blood (routine x 2)     Status: None (Preliminary result)   Collection Time: 11/29/20  3:51 PM   Specimen: BLOOD  Result Value Ref Range Status   Specimen Description BLOOD BLOOD LEFT ARM  Final   Special Requests   Final    BOTTLES DRAWN AEROBIC AND ANAEROBIC Blood Culture adequate volume   Culture   Final    NO GROWTH 2 DAYS Performed at [facility], [address] [place], [place] [address]    Report Status PENDING  Incomplete  Culture, blood (routine x 2)     Status: None (Preliminary result)   Collection Time: 11/29/20  3:51 PM  Specimen: BLOOD  Result Value Ref Range Status   Specimen Description BLOOD BLOOD LEFT WRIST  Final   Special Requests   Final    BOTTLES DRAWN AEROBIC AND ANAEROBIC Blood Culture adequate volume   Culture   Final    NO GROWTH 2 DAYS Performed at [facility], [place], [place] [address]    Report Status PENDING  Incomplete  SARS CORONAVIRUS 2 (TAT 6-24 HRS) Nasopharyngeal Nasopharyngeal Swab     Status: None   Collection Time: 11/30/20  9:10 AM   Specimen: Nasopharyngeal Swab  Result Value Ref Range Status   SARS Coronavirus 2 NEGATIVE NEGATIVE Final    Comment: (NOTE) SARS-CoV-2 target nucleic acids are NOT DETECTED.  The SARS-CoV-2 RNA is generally detectable in upper and lower respiratory specimens during the acute phase of infection. Negative results do not preclude SARS-CoV-2 infection, do not rule out co-infections with other pathogens, and should not be used as the sole basis for treatment or other patient management decisions. Negative results must be combined with clinical observations, patient history, and epidemiological information. The expected result is Negative.  Fact Sheet for Patients: SugarRoll.be  Fact Sheet for Healthcare  Providers: https://www.woods-mathews.com/  This test is not yet approved or cleared by the [place] FDA and  has been authorized for detection and/or diagnosis of SARS-CoV-2 by FDA under an Emergency Use Authorization (EUA). This EUA will remain  in effect (meaning this test can be used) for the duration of the COVID-19 declaration under Se ction 564(b)(1) of the Act, 21 U.S.C. section 360bbb-3(b)(1), unless the authorization is terminated or revoked sooner.  Performed at [facility], [place] [address], [place], [place] [address]     Procedures/Studies: CT ABDOMEN PELVIS WO CONTRAST  Result Date: 11/29/2020 CLINICAL DATA:  RLQ abdominal pain EXAM: CT ABDOMEN AND PELVIS WITHOUT CONTRAST TECHNIQUE: Multidetector CT imaging of the abdomen and pelvis was performed following the standard protocol without IV contrast. COMPARISON:  None. FINDINGS: Lower chest: Bilateral lower lung scarring and fibrotic change. Markedly dilated right atrium. Mitral annular calcifications. Hepatobiliary: Mild periportal edema likely due to congestion. No focal liver lesion. Prior cholecystectomy. Pancreas: Unremarkable. No pancreatic ductal dilatation or surrounding inflammatory changes. Spleen: Normal in size without focal abnormality. Adrenals/Urinary Tract: Nodular left adrenal thickening with density less than 10 ounce 40 units measuring 1.2 cm, cystically likely to be an adenoma. No hydronephrosis. There is a left lower pole hypodense lesion which is likely a cyst. The bladder is moderately distended but unremarkable. Stomach/Bowel: The stomach is within normal limits. There is no evidence of bowel obstruction. There is extensive colonic diverticulosis. There is no evidence of appendicitis. Vascular/Lymphatic: Extensive aorto bi-iliac atherosclerotic calcifications. No AAA. There is no lymphadenopathy. Reproductive: Endometrial thickening. Other: There is a periumbilical hernia containing a long  segment of transverse colon, which has extensive diverticuli. Musculoskeletal: Prior left femoral neck fixation. There is no acute osseous abnormality. There is moderate right hip osteoarthritis. Trace anterolisthesis at L4-L5. There are broad-based disc bulges notable throughout the lumbar spine with multilevel facet arthritis. This likely results in varying degrees of mild to moderate spinal canal and neural foraminal narrowing. IMPRESSION: No acute abdominopelvic abnormality. Extensive colonic diverticulosis.  No evidence of diverticulitis. Periumbilical hernia containing a long segment of transverse colon without evidence of obstruction. Evidence of endometrial thickening. Correlate with any history of abnormal uterine bleeding and recommend non-emergent pelvic ultrasound Markedly dilated right atrium. Electronically Signed   By: Maurine Simmering  On: 11/29/2020 14:50   CT Hip Right Wo Contrast  Result Date: 11/29/2020 CLINICAL DATA:  Right hip pain for the past month.  No injury. EXAM: CT OF THE RIGHT HIP WITHOUT CONTRAST TECHNIQUE: Multidetector CT imaging of the right hip was performed according to the standard protocol. Multiplanar CT image reconstructions were also generated. COMPARISON:  Right hip x-rays from same day. MRI right hip dated February 19, 2019. FINDINGS: Bones/Joint/Cartilage No fracture or dislocation. Unchanged moderate right hip osteoarthritis. Unchanged small right hip joint effusion. Ligaments Ligaments are suboptimally evaluated by CT. Muscles and Tendons Grossly intact. New small amount of fluid in the right iliopsoas bursa (series 4, image 38). Unchanged right gluteus minimus muscle atrophy. Soft tissue No fluid collection or hematoma.  No soft tissue mass. IMPRESSION: 1.  No acute osseous abnormality. 2. New mild right iliopsoas bursitis. 3. Unchanged moderate right hip osteoarthritis with small joint effusion. Electronically Signed   By: Titus Dubin M.D.   On: 11/29/2020 14:16    DG Hip Unilat W or Wo Pelvis 2-3 Views Right  Result Date: 11/29/2020 CLINICAL DATA:  Right hip pain.  No known injury. EXAM: DG HIP (WITH OR WITHOUT PELVIS) 2-3V RIGHT COMPARISON:  Plain films right hip 08/24/2019. FINDINGS: There is no acute bony or joint abnormality. Moderate to moderately severe right hip osteoarthritis is unchanged in appearance. The patient has undergone fixation of a left subcapital fracture since the prior study. No focal bony lesion. Large appearing colonic stool burden is partially imaged. IMPRESSION: No acute abnormality. No change in right hip osteoarthritis. Status post fixation of a left subcapital fracture. Electronically Signed   By: Inge Rise M.D.   On: 11/29/2020 12:12    Labs: BNP (last 3 results) No results for input(s): BNP in the last 8760 hours. Basic Metabolic Panel: Recent Labs  Lab 11/29/20 1321 11/30/20 0609 12/01/20 0651  NA 135 137 135  K 4.1 5.0 4.7  CL 105 106 105  CO2 22 24 25   GLUCOSE 119* 133* 124*  BUN 29* 25* 41*  CREATININE 0.91 0.87 0.91  CALCIUM 9.2 8.6* 8.9  MG  --  1.6* 2.0  PHOS  --  3.4 2.6    CBC: Recent Labs  Lab 11/29/20 1321 11/30/20 0609 12/01/20 0651  WBC 16.1* 9.5 18.5*  NEUTROABS 12.5*  --   --   HGB 14.4 13.9 14.1  HCT 43.3 41.6 41.4  MCV 105.6* 104.3* 102.2*  PLT 177 191 228    CBG: Recent Labs  Lab 11/30/20 2044  GLUCAP 140*   Urinalysis    Component Value Date/Time   COLORURINE YELLOW (A) 11/29/2020 1321   APPEARANCEUR HAZY (A) 11/29/2020 1321   LABSPEC 1.025 11/29/2020 1321   PHURINE 5.0 11/29/2020 1321   GLUCOSEU NEGATIVE 11/29/2020 1321   HGBUR MODERATE (A) 11/29/2020 1321   BILIRUBINUR NEGATIVE 11/29/2020 1321   KETONESUR NEGATIVE 11/29/2020 1321   PROTEINUR NEGATIVE 11/29/2020 1321   NITRITE POSITIVE (A) 11/29/2020 1321   LEUKOCYTESUR MODERATE (A) 11/29/2020 1321   Sepsis Labs Invalid input(s): PROCALCITONIN,  WBC,  LACTICIDVEN Microbiology Recent Results (from the  past 240 hour(s))  Culture, blood (routine x 2)     Status: None (Preliminary result)   Collection Time: 11/29/20  3:51 PM   Specimen: BLOOD  Result Value Ref Range Status   Specimen Description BLOOD BLOOD LEFT ARM  Final   Special Requests   Final    BOTTLES DRAWN AEROBIC AND ANAEROBIC Blood Culture adequate volume  Culture   Final    NO GROWTH 2 DAYS Performed at [facility], [address] [place], [place] [address]    Report Status PENDING  Incomplete  Culture, blood (routine x 2)     Status: None (Preliminary result)   Collection Time: 11/29/20  3:51 PM   Specimen: BLOOD  Result Value Ref Range Status   Specimen Description BLOOD BLOOD LEFT WRIST  Final   Special Requests   Final    BOTTLES DRAWN AEROBIC AND ANAEROBIC Blood Culture adequate volume   Culture   Final    NO GROWTH 2 DAYS Performed at [facility], [place], [place] [address]    Report Status PENDING  Incomplete  SARS CORONAVIRUS 2 (TAT 6-24 HRS) Nasopharyngeal Nasopharyngeal Swab     Status: None   Collection Time: 11/30/20  9:10 AM   Specimen: Nasopharyngeal Swab  Result Value Ref Range Status   SARS Coronavirus 2 NEGATIVE NEGATIVE Final    Comment: (NOTE) SARS-CoV-2 target nucleic acids are NOT DETECTED.  The SARS-CoV-2 RNA is generally detectable in upper and lower respiratory specimens during the acute phase of infection. Negative results do not preclude SARS-CoV-2 infection, do not rule out co-infections with other pathogens, and should not be used as the sole basis for treatment or other patient management decisions. Negative results must be combined with clinical observations, patient history, and epidemiological information. The expected result is Negative.  Fact Sheet for Patients: SugarRoll.be  Fact Sheet for Healthcare Providers: https://www.woods-mathews.com/  This test is not yet approved or cleared by the  [place] FDA and  has been authorized for detection and/or diagnosis of SARS-CoV-2 by FDA under an Emergency Use Authorization (EUA). This EUA will remain  in effect (meaning this test can be used) for the duration of the COVID-19 declaration under Se ction 564(b)(1) of the Act, 21 U.S.C. section 360bbb-3(b)(1), unless the authorization is terminated or revoked sooner.  Performed at [facility], [place] [address], [place], [place] [address]      Time coordinating discharge: > 30 minutes  SIGNED: George Hugh, MD  [facility] 12/01/2020, 7:06 PM  If 7PM-7AM, please contact night-coverage [email or web address]

## 2020-12-01 NOTE — Progress Notes (Signed)
Occupational Therapy Treatment Patient Details Name: Angela Leonard MRN: [id number] DOB: 18-May-1935 Today's Date: 12/01/2020    History of present illness Angela Leonard is a 86 y.o. female with medical history significant of hypertension, hyperlipidemia, atrial fibrillation on Eliquis, mild dementia with sundowning phenomena, rheumatoid arthritis on methotrexate, hx L hip pinning after fall (3/22), who presents RLQ abdominal pain and R hip pain and trouble putting weight on R leg.  Admitted for management of sepsis related to UTI and R hip pain.  Imaging significant for R iliopsoas bursitis and small joint effusion; ortho consult pending.   OT comments  Angela Leonard was seen for OT tx this date. Pt received semi-supine in bed, finishing breakfast. Pt endorses mild pain in her R hip, and remains pleasantly confused t/o session. She requires consistent re-direction to time of day and mildly perseverates on the fact that it is morning, not evening. She performs bed/functional mobility with intermittent MIN A and cueing for safety. Pt denies use of AE for functional mobility at baseline. Educated on safe use of RW for improved safety and functional independence during transfers. Falls prevention education provided t/o session. Pt return verbalizes understanding, but continues to benefit from ongoing education 2/2 baseline hx of dementia. Pt performs seated UB grooming (face washing) with set-up assist.Pt agreeable to OT tx session. Pt making good progress toward goals. Pt continues to benefit from skilled OT services to maximize return to PLOF and minimize risk of future falls, injury, caregiver burden, and readmission. Will continue to follow POC. Discharge recommendation remains appropriate.    Follow Up Recommendations  Home health OT;Supervision/Assistance - 24 hour    Equipment Recommendations  None recommended by OT    Recommendations for Other Services      Precautions / Restrictions  Precautions Precautions: Fall Restrictions Weight Bearing Restrictions: No       Mobility Bed Mobility Overal bed mobility: Needs Assistance Bed Mobility: Supine to Sit;Sit to Supine     Supine to sit: Min assist     General bed mobility comments: Min A to advance hips toward EOB this date.    Transfers Overall transfer level: Needs assistance Equipment used: Rolling walker (2 wheeled) Transfers: Sit to/from Stand Sit to Stand: Min guard         General transfer comment: limited awareness/integration of cuing for hand placement; assist for lift off, balance    Balance Overall balance assessment: Needs assistance Sitting-balance support: No upper extremity supported;Feet supported Sitting balance-[name] Scale: Good Sitting balance - Comments: steady static sitting, reaching within BOS.   Standing balance support: Single extremity supported;Bilateral upper extremity supported;During functional activity Standing balance-[name] Scale: Fair Standing balance comment: Poor safety awareness, tends to reach for objects in room for UE support rather than AE. Easily re-directable.                           ADL either performed or assessed with clinical judgement   ADL Overall ADL's : Needs assistance/impaired                                       General ADL Comments: Pt performs bed/functional mobility with intermittent MIN A and cueing for safety. Pt denies use of AE for functional mobility at baseline. Educated on safe use of RW for improved safety and functional independence during transfers. Pt return verbalizes understanding, but continues  to benefit from ongoing education 2/2 baseline hx of dementia. Pt performs seated UB grooming (face washing) with set-up assist. She remains pleasantly confused t/o session.     Vision Baseline Vision/History: Wears glasses Patient Visual Report: No change from baseline     Perception     Praxis       Cognition Arousal/Alertness: Awake/alert Behavior During Therapy: WFL for tasks assessed/performed Overall Cognitive Status: History of cognitive impairments - at baseline                                 General Comments: pt pleasantly confused, follows commands well, oriented to self; able to effectively make needs known.        Exercises Other Exercises Other Exercises: OT facilitates bed/functional mobility, seated UB grooming tasks, and falls prevention education as described above (see ADL section for additional detail).   Shoulder Instructions       General Comments      Pertinent Vitals/ Pain       Faces Pain Scale: Hurts a little bit Pain Location: R hip with movement/WBing Pain Descriptors / Indicators: Guarding;Grimacing Pain Intervention(s): Limited activity within patient's tolerance;Monitored during session;Repositioned  Home Living                                          Prior Functioning/Environment              Frequency  Min 1X/week        Progress Toward Goals  OT Goals(current goals can now be found in the care plan section)  Progress towards OT goals: Progressing toward goals  Acute Rehab OT Goals Patient Stated Goal: get a cocktail OT Goal Formulation: With patient Time For Goal Achievement: 12/14/20 Potential to Achieve Goals: Good  Plan Discharge plan remains appropriate;Frequency remains appropriate    Co-evaluation                 AM-PAC OT "6 Clicks" Daily Activity     Outcome Measure   Help from another person eating meals?: None Help from another person taking care of personal grooming?: A Little Help from another person toileting, which includes using toliet, bedpan, or urinal?: A Little Help from another person bathing (including washing, rinsing, drying)?: A Little Help from another person to put on and taking off regular upper body clothing?: A Little Help from another person to  put on and taking off regular lower body clothing?: A Little 6 Click Score: 19    End of Session Equipment Utilized During Treatment: Gait belt;Rolling walker  OT Visit Diagnosis: Other abnormalities of gait and mobility (R26.89);Pain Pain - Right/Left: Right Pain - part of body: Hip   Activity Tolerance Patient tolerated treatment well   Patient Left in bed;with call Mau/phone within reach;with bed alarm set   Nurse Communication Mobility status        Time: 2025-4270 OT Time Calculation (min): 24 min  Charges: OT General Charges $OT Visit: 1 Visit OT Treatments $Self Care/Home Management : 23-37 mins  Shara Blazing, M.S., OTR/L Ascom: [phone] 12/01/20, 11:53 AM

## 2020-12-01 NOTE — Care Management Important Message (Signed)
Important Message  Patient Details  Name: Angela Leonard MRN: [id number] Date of Birth: 01/06/1935   Medicare Important Message Given:  N/A - LOS <3 / Initial given by admissions     Juliann Pulse A Bricen Victory 12/01/2020, 8:51 AM

## 2020-12-04 LAB — CULTURE, BLOOD (ROUTINE X 2)
Culture: NO GROWTH
Culture: NO GROWTH
Special Requests: ADEQUATE
Special Requests: ADEQUATE

## 2020-12-18 ENCOUNTER — Other Ambulatory Visit: Payer: Self-pay

## 2020-12-18 ENCOUNTER — Emergency Department: Payer: Medicare Other

## 2020-12-18 ENCOUNTER — Emergency Department
Admission: EM | Admit: 2020-12-18 | Discharge: 2020-12-18 | Disposition: A | Discharge status: Home / Self Care | Payer: Medicare Other | Attending: Emergency Medicine | Admitting: Emergency Medicine

## 2020-12-18 ENCOUNTER — Encounter: Payer: Self-pay | Admitting: *Deleted

## 2020-12-18 DIAGNOSIS — F0390 Unspecified dementia without behavioral disturbance: Secondary | ICD-10-CM | POA: Insufficient documentation

## 2020-12-18 DIAGNOSIS — S0191XD Laceration without foreign body of unspecified part of head, subsequent encounter: Secondary | ICD-10-CM | POA: Insufficient documentation

## 2020-12-18 DIAGNOSIS — I4891 Unspecified atrial fibrillation: Secondary | ICD-10-CM | POA: Diagnosis not present

## 2020-12-18 DIAGNOSIS — W19XXXD Unspecified fall, subsequent encounter: Secondary | ICD-10-CM | POA: Insufficient documentation

## 2020-12-18 DIAGNOSIS — Z4800 Encounter for change or removal of nonsurgical wound dressing: Secondary | ICD-10-CM | POA: Insufficient documentation

## 2020-12-18 DIAGNOSIS — W19XXXA Unspecified fall, initial encounter: Secondary | ICD-10-CM

## 2020-12-18 DIAGNOSIS — I10 Essential (primary) hypertension: Secondary | ICD-10-CM | POA: Insufficient documentation

## 2020-12-18 DIAGNOSIS — S0990XA Unspecified injury of head, initial encounter: Secondary | ICD-10-CM | POA: Diagnosis present

## 2020-12-18 DIAGNOSIS — Z7901 Long term (current) use of anticoagulants: Secondary | ICD-10-CM | POA: Diagnosis not present

## 2020-12-18 DIAGNOSIS — Z5321 Procedure and treatment not carried out due to patient leaving prior to being seen by health care provider: Secondary | ICD-10-CM | POA: Insufficient documentation

## 2020-12-18 DIAGNOSIS — S0083XA Contusion of other part of head, initial encounter: Secondary | ICD-10-CM

## 2020-12-18 DIAGNOSIS — W1839XA Other fall on same level, initial encounter: Secondary | ICD-10-CM | POA: Diagnosis not present

## 2020-12-18 DIAGNOSIS — Z87891 Personal history of nicotine dependence: Secondary | ICD-10-CM | POA: Diagnosis not present

## 2020-12-18 DIAGNOSIS — Z79899 Other long term (current) drug therapy: Secondary | ICD-10-CM | POA: Diagnosis not present

## 2020-12-18 DIAGNOSIS — S01111A Laceration without foreign body of right eyelid and periocular area, initial encounter: Secondary | ICD-10-CM

## 2020-12-18 MED ORDER — LIDOCAINE HCL (PF) 1 % IJ SOLN
INTRAMUSCULAR | Status: AC
  Filled 2020-12-18: qty 5

## 2020-12-18 MED ORDER — LIDOCAINE HCL (PF) 1 % IJ SOLN
5.0000 mL | Freq: Once | INTRAMUSCULAR | Status: AC
  Administered 2020-12-18: 5 mL via INTRADERMAL
  Filled 2020-12-18: qty 5

## 2020-12-18 NOTE — ED Triage Notes (Signed)
Pt comes into the ED via POV with her home caregiver c/o mechanical fall.  PT presents with hematoma and laceration to the right side of her forehead.  PT denies any LOC but is currently on Eliquis.  Pt in NAD at this time and is acting WNL of her baseline.  Pt has all bleeding under control at this time.  Pt denies any neck pain and denies any nausea.  PT still able to eat her breakfast and take all morning meds and denies any dizziness.

## 2020-12-18 NOTE — ED Triage Notes (Signed)
Pt brought in by caregiver.  Pt was seen here earlier today for a laceration   pt has bleeding from wound.  Pt is on blood thinners.  Pt alert  speech clear.

## 2020-12-18 NOTE — ED Provider Notes (Signed)
[facility] Emergency Department Provider Note   ____________________________________________   Event Date/Time   First MD Initiated Contact with Patient 12/18/20 1137     (approximate)  I have reviewed the triage vital signs and the nursing notes.   HISTORY  Chief Complaint Fall    HPI Angela Leonard is a 86 y.o. female who presents after a fall complaining of a contusion and laceration to the right eyebrow  LOCATION: Right eyebrow DURATION: 2 hours prior to arrival TIMING: Stable since onset SEVERITY: Mild QUALITY: Fall with laceration CONTEXT: Patient states she lost her footing and fell onto the floor MODIFYING FACTORS: Any palpation worsens this pain and has no relieving factors ASSOCIATED SYMPTOMS: Associated headache   Per medical record review, history noncontributory          Past Medical History:  Diagnosis Date   HLD (hyperlipidemia)    Hypertension     Patient Active Problem List   Diagnosis Date Noted   Right hip pain 11/29/2020   Atrial fibrillation with RVR (HCC)    Hypotension    Mass of skin of head    Dementia without behavioral disturbance ([place])    Closed hip fracture requiring operative repair, left, sequela 08/11/2020   Dehydration 08/25/2019   Acute lower UTI 08/24/2019   Fall at home, initial encounter 08/24/2019   Hypokalemia 08/24/2019   RA (rheumatoid arthritis) ([place]) 08/24/2019   Fall 08/24/2019   Facial laceration    Chronic atrial fibrillation, unspecified ([place]) 02/19/2019   Sepsis ([place]) 08/03/2018   Atypical pneumonia 08/03/2018   Arthritis of right hip 08/03/2018   HTN (hypertension) 08/03/2018   HLD (hyperlipidemia) 08/03/2018   AKI (acute kidney injury) ([place]) 09/15/2017   Symptomatic cholelithiasis 10/13/2014    Past Surgical History:  Procedure Laterality Date   BACK SURGERY     CHOLECYSTECTOMY N/A 10/14/2014   Procedure: LAPAROSCOPIC CHOLECYSTECTOMY;  Surgeon: Marlyce Huge, MD;   Location: [facility] ORS;  Service: General;  Laterality: N/A;   COLON SURGERY     HIP PINNING,CANNULATED Left 08/12/2020   Procedure: CANNULATED HIP PINNING;  Surgeon: Earnestine Leys, MD;  Location: [facility] ORS;  Service: Orthopedics;  Laterality: Left;    Prior to Admission medications   Medication Sig Start Date End Date Taking? Authorizing Provider  acetaminophen (TYLENOL) 325 MG tablet Take 2 tablets (650 mg total) by mouth every 6 (six) hours as needed for mild pain or moderate pain (pain score 1-3 or temp > 100.5). 08/15/20   Loletha Grayer, MD  apixaban (ELIQUIS) 2.5 MG TABS tablet Take 2.5 mg by mouth 2 (two) times daily.    [provider]  cholecalciferol (VITAMIN D) 1000 units tablet Take 1,000 Units by mouth daily.    [provider]  diltiazem (TIAZAC) 180 MG 24 hr capsule Take 180 mg by mouth daily. 11/06/20   [provider]  ferrous sulfate 325 (65 FE) MG tablet Take 1 tablet (325 mg total) by mouth daily with breakfast. 08/15/20   Loletha Grayer, MD  folic acid (FOLVITE) 1 MG tablet Take 1 mg by mouth daily.    [provider]  HYDROcodone-acetaminophen (NORCO) 7.5-325 MG tablet Take 1 tablet by mouth every 6 (six) hours as needed for severe pain (pain score 7-10). 08/15/20   Loletha Grayer, MD  methotrexate (RHEUMATREX) 2.5 MG tablet Take 7 tablets by mouth every Tuesday.     [provider]  mirtazapine (REMERON) 15 MG tablet Take 15 mg by mouth at bedtime. 07/14/20  [provider]  multivitamin-lutein (OCUVITE-LUTEIN) CAPS capsule Take 1 capsule by mouth 2 (two) times daily. 08/15/20   Loletha Grayer, MD  ramipril (ALTACE) 5 MG capsule Take 5 mg by mouth daily. 10/11/20   [provider]  traZODone (DESYREL) 50 MG tablet Take 0.5 tablets (25 mg total) by mouth at bedtime as needed for sleep. 08/15/20   Loletha Grayer, MD  vitamin B-12 (CYANOCOBALAMIN) 1000 MCG tablet Take 1,000 mcg by mouth daily.    [provider]    Allergies Patient has no known allergies.  Family History  Problem Relation Age of Onset   Alzheimer's disease Mother    Parkinson's disease Mother    Cancer Father    Heart disease Sister    Parkinson's disease Brother    Heart disease Brother     Social History Social History   Tobacco Use   Smoking status: Former   Smokeless tobacco: Never  Substance Use Topics   Alcohol use: No   Drug use: No    Review of Systems Constitutional: No fever/chills Eyes: No visual changes. ENT: No sore throat. Cardiovascular: Denies chest pain. Respiratory: Denies shortness of breath. Gastrointestinal: No abdominal pain.  No nausea, no vomiting.  No diarrhea. Genitourinary: Negative for dysuria. Musculoskeletal: Negative for acute arthralgias Skin: Negative for rash.  Endorses laceration to right eyebrow Neurological: Negative for headaches, weakness/numbness/paresthesias in any extremity Psychiatric: Negative for suicidal ideation/homicidal ideation   ____________________________________________   PHYSICAL EXAM:  VITAL SIGNS: ED Triage Vitals  Enc Vitals Group     BP 12/18/20 0854 (!) 139/99     Pulse Rate 12/18/20 0854 (!) 105     Resp 12/18/20 0854 17     Temp 12/18/20 0854 97.8 F (36.6 C)     Temp src --      SpO2 12/18/20 0854 95 %     Weight 12/18/20 0855 164 lb 14.5 oz (74.8 kg)     Height 12/18/20 0855 5\' 5"  (1.651 m)     Head Circumference --      Peak Flow --      Pain Score 12/18/20 0855 0     Pain Loc --      Pain Edu? --      Excl. in [place]? --    Constitutional: Alert and oriented. Well appearing and in no acute distress. Eyes: Conjunctivae are normal. PERRL. Head: Laceration underlying contusion to right eyebrow Nose: No congestion/rhinnorhea. Mouth/Throat: Mucous membranes are moist. Neck: No stridor Cardiovascular: Grossly normal heart sounds.  Good peripheral circulation. Respiratory: Normal respiratory effort.  No  retractions. Gastrointestinal: Soft and nontender. No distention. Musculoskeletal: No obvious deformities Neurologic:  Normal speech and language. No gross focal neurologic deficits are appreciated. Skin:  Skin is warm and dry. No rash noted.  2.5 cm linear horizontal laceration to the right lateral eyebrow Psychiatric: Mood and affect are normal. Speech and behavior are normal.  RADIOLOGY  ED MD interpretation: CT of the head without contrast shows a right frontal scalp laceration and small hematoma.  No evidence of  intracerebral hemorrhage, obvious masses, or significant edema  Official radiology report(s): CT Head Wo Contrast  Result Date: 12/18/2020 CLINICAL DATA:  Complains of mechanical fall with hematoma and laceration to right side of forehead EXAM: CT HEAD WITHOUT CONTRAST TECHNIQUE: Contiguous axial images were obtained from the base of the skull through the vertex without intravenous contrast. COMPARISON:  09/25/2019 FINDINGS: Brain: No evidence of acute infarction, hemorrhage, hydrocephalus, extra-axial collection or mass  lesion/mass effect. Prominence of the sulci and ventricles compatible with brain atrophy. There is marked diffuse low-attenuation within the subcortical and periventricular white matter compatible with chronic microvascular disease. Vascular: No hyperdense vessel or unexpected calcification. Skull: Normal. Negative for fracture or focal lesion. Sinuses/Orbits: No acute finding. Other: Right frontal scalp laceration and small hematoma. IMPRESSION: 1. No acute intracranial abnormalities. 2. Chronic small vessel ischemic disease and brain atrophy. 3. Right frontal scalp laceration and small hematoma. Electronically Signed   By: Kerby Moors M.D.   On: 12/18/2020 09:45    ____________________________________________   PROCEDURES  Procedure(s) performed (including Critical Care):  .1-3 Lead EKG Interpretation  Date/Time: 12/18/2020 1:27 PM Performed by: Naaman Plummer, MD Authorized by: Naaman Plummer, MD     Interpretation: normal     ECG rate:  78   ECG rate assessment: normal     Rhythm: sinus rhythm     Ectopy: none     Conduction: normal     ____________________________________________   INITIAL IMPRESSION / ASSESSMENT AND PLAN / ED COURSE  As part of my medical decision making, I reviewed the following data within the electronic medical record, if available:  Nursing notes reviewed and incorporated, Labs reviewed, EKG interpreted, Old chart reviewed, Radiograph reviewed and Notes from prior ED visits reviewed and incorporated        Patient presenting with head trauma.  Patient's neurological exam was non-focal and unremarkable.  [place] Head CT Rule was applied and patient did not fall into the low risk category so a head CT was obtained.  This showed no significant findings.  At this time, it is felt that the most likely explanation for the patient's symptoms is concussion.   I also considered SAH, SDH, Epidural Hematoma, IPH, skull fracture, migraine but this appears less likely considering the data gathered thus far.   Patient provided with laceration repair and concussion precautions.   Patient remained stable and neurologically intact while in the emergency department.  Discussed warning signs that would prompt return to ED.  Head trauma handout was provided.  Discussed in detail concussion management.  No sports or strenuous activity until symptoms free.  Return to emergency department urgently if new or worsening symptoms develop.    Impression:  Concussion Right eyebrow laceration Right eyebrow contusion  Plan  Discharge from ED Tylenol for pain control. Avoid aspirin, NSAIDs, or other blood thinners. Advised patient on supportive measures for cognitive rest - avoid use of cognitive function for at least 24 hours.  This means no tv, books, texting, computers, etc. Limit visitors to the house.  Head trauma instructions  provided in discharge instructions Instructed Pt to monitor for neurologic symptoms, severe HA, change in mental status, seizures, loss of conciousness. Instructed Pt to f/up w/ PCP in 3 days or ETC should symptoms worsen or not improve. Pt verbally expressed understanding and all questions were addressed to Pt's satisfaction.      ____________________________________________   FINAL CLINICAL IMPRESSION(S) / ED DIAGNOSES  Final diagnoses:  Fall, initial encounter  Contusion of face, initial encounter  Eyebrow laceration, right, initial encounter     ED Discharge Orders     None        Note:  This document was prepared using Dragon voice recognition software and may include unintentional dictation errors.    Naaman Plummer, MD 12/18/20 1328

## 2020-12-19 ENCOUNTER — Other Ambulatory Visit: Payer: Self-pay

## 2020-12-19 ENCOUNTER — Emergency Department
Admission: EM | Admit: 2020-12-19 | Discharge: 2020-12-19 | Disposition: A | Discharge status: Home / Self Care | Payer: Medicare Other | Attending: Student in an Organized Health Care Education/Training Program | Admitting: Student in an Organized Health Care Education/Training Program

## 2020-12-19 ENCOUNTER — Emergency Department
Admission: EM | Admit: 2020-12-19 | Discharge: 2020-12-19 | Disposition: A | Discharge status: Home / Self Care | Payer: Medicare Other | Source: Home / Self Care

## 2020-12-19 DIAGNOSIS — F0390 Unspecified dementia without behavioral disturbance: Secondary | ICD-10-CM | POA: Diagnosis not present

## 2020-12-19 DIAGNOSIS — S01111A Laceration without foreign body of right eyelid and periocular area, initial encounter: Secondary | ICD-10-CM

## 2020-12-19 DIAGNOSIS — Z87891 Personal history of nicotine dependence: Secondary | ICD-10-CM | POA: Insufficient documentation

## 2020-12-19 DIAGNOSIS — I4891 Unspecified atrial fibrillation: Secondary | ICD-10-CM | POA: Insufficient documentation

## 2020-12-19 DIAGNOSIS — Z79899 Other long term (current) drug therapy: Secondary | ICD-10-CM | POA: Insufficient documentation

## 2020-12-19 DIAGNOSIS — I10 Essential (primary) hypertension: Secondary | ICD-10-CM | POA: Insufficient documentation

## 2020-12-19 DIAGNOSIS — W19XXXA Unspecified fall, initial encounter: Secondary | ICD-10-CM | POA: Insufficient documentation

## 2020-12-19 DIAGNOSIS — Z7901 Long term (current) use of anticoagulants: Secondary | ICD-10-CM | POA: Diagnosis not present

## 2020-12-19 DIAGNOSIS — S0591XA Unspecified injury of right eye and orbit, initial encounter: Secondary | ICD-10-CM | POA: Diagnosis present

## 2020-12-19 LAB — CBC
HCT: 41.8 % (ref 36.0–46.0)
Hemoglobin: 13.8 g/dL (ref 12.0–15.0)
MCH: 34.9 pg — ABNORMAL HIGH (ref 26.0–34.0)
MCHC: 33 g/dL (ref 30.0–36.0)
MCV: 105.8 fL — ABNORMAL HIGH (ref 80.0–100.0)
Platelets: 240 10*3/uL (ref 150–400)
RBC: 3.95 MIL/uL (ref 3.87–5.11)
RDW: 15.7 % — ABNORMAL HIGH (ref 11.5–15.5)
WBC: 11.1 10*3/uL — ABNORMAL HIGH (ref 4.0–10.5)
nRBC: 0 % (ref 0.0–0.2)

## 2020-12-19 LAB — BASIC METABOLIC PANEL
Anion gap: 6 (ref 5–15)
BUN: 25 mg/dL — ABNORMAL HIGH (ref 8–23)
CO2: 25 mmol/L (ref 22–32)
Calcium: 9.2 mg/dL (ref 8.9–10.3)
Chloride: 105 mmol/L (ref 98–111)
Creatinine, Ser: 1.01 mg/dL — ABNORMAL HIGH (ref 0.44–1.00)
GFR, Estimated: 55 mL/min — ABNORMAL LOW (ref >60)
Glucose, Bld: 153 mg/dL — ABNORMAL HIGH (ref 70–99)
Potassium: 4.3 mmol/L (ref 3.5–5.1)
Sodium: 136 mmol/L (ref 135–145)

## 2020-12-19 MED ORDER — LIDOCAINE-EPINEPHRINE 2 %-1:100000 IJ SOLN
20.0000 mL | Freq: Once | INTRAMUSCULAR | Status: AC
  Administered 2020-12-19: 20 mL
  Filled 2020-12-19: qty 1

## 2020-12-19 NOTE — ED Notes (Signed)
Non-adherent pad and kerlix placed over wound.

## 2020-12-19 NOTE — ED Provider Notes (Signed)
[facility] Emergency Department Provider Note    Event Date/Time   First MD Initiated Contact with Patient 12/19/20 1254     (approximate)  I have reviewed the triage vital signs and the nursing notes.   HISTORY  Chief Complaint wound bleeding    HPI Angela Leonard is a 86 y.o. female who presents to the ER for bleeding from right eyebrow wound she was seen yesterday and laceration repair performed.  She is on Eliquis and has had persistent oozing and bleeding since yesterday.  No other new complaints no interval injury.  Past Medical History:  Diagnosis Date   HLD (hyperlipidemia)    Hypertension    Family History  Problem Relation Age of Onset   Alzheimer's disease Mother    Parkinson's disease Mother    Cancer Father    Heart disease Sister    Parkinson's disease Brother    Heart disease Brother    Past Surgical History:  Procedure Laterality Date   BACK SURGERY     CHOLECYSTECTOMY N/A 10/14/2014   Procedure: LAPAROSCOPIC CHOLECYSTECTOMY;  Surgeon: Marlyce Huge, MD;  Location: [facility] ORS;  Service: General;  Laterality: N/A;   COLON SURGERY     HIP PINNING,CANNULATED Left 08/12/2020   Procedure: CANNULATED HIP PINNING;  Surgeon: Earnestine Leys, MD;  Location: [facility] ORS;  Service: Orthopedics;  Laterality: Left;   Patient Active Problem List   Diagnosis Date Noted   Right hip pain 11/29/2020   Atrial fibrillation with RVR (HCC)    Hypotension    Mass of skin of head    Dementia without behavioral disturbance ([place])    Closed hip fracture requiring operative repair, left, sequela 08/11/2020   Dehydration 08/25/2019   Acute lower UTI 08/24/2019   Fall at home, initial encounter 08/24/2019   Hypokalemia 08/24/2019   RA (rheumatoid arthritis) ([place]) 08/24/2019   Fall 08/24/2019   Facial laceration    Chronic atrial fibrillation, unspecified ([place]) 02/19/2019   Sepsis ([place]) 08/03/2018   Atypical pneumonia 08/03/2018   Arthritis of  right hip 08/03/2018   HTN (hypertension) 08/03/2018   HLD (hyperlipidemia) 08/03/2018   AKI (acute kidney injury) ([place]) 09/15/2017   Symptomatic cholelithiasis 10/13/2014      Prior to Admission medications   Medication Sig Start Date End Date Taking? Authorizing Provider  acetaminophen (TYLENOL) 325 MG tablet Take 2 tablets (650 mg total) by mouth every 6 (six) hours as needed for mild pain or moderate pain (pain score 1-3 or temp > 100.5). 08/15/20   Loletha Grayer, MD  apixaban (ELIQUIS) 2.5 MG TABS tablet Take 2.5 mg by mouth 2 (two) times daily.    [provider]  cholecalciferol (VITAMIN D) 1000 units tablet Take 1,000 Units by mouth daily.    [provider]  diltiazem (TIAZAC) 180 MG 24 hr capsule Take 180 mg by mouth daily. 11/06/20   [provider]  ferrous sulfate 325 (65 FE) MG tablet Take 1 tablet (325 mg total) by mouth daily with breakfast. 08/15/20   Loletha Grayer, MD  folic acid (FOLVITE) 1 MG tablet Take 1 mg by mouth daily.    [provider]  HYDROcodone-acetaminophen (NORCO) 7.5-325 MG tablet Take 1 tablet by mouth every 6 (six) hours as needed for severe pain (pain score 7-10). 08/15/20   Loletha Grayer, MD  methotrexate (RHEUMATREX) 2.5 MG tablet Take 7 tablets by mouth every Tuesday.     [provider]  mirtazapine (REMERON) 15 MG tablet Take 15 mg  by mouth at bedtime. 07/14/20   [provider]  multivitamin-lutein (OCUVITE-LUTEIN) CAPS capsule Take 1 capsule by mouth 2 (two) times daily. 08/15/20   Loletha Grayer, MD  ramipril (ALTACE) 5 MG capsule Take 5 mg by mouth daily. 10/11/20   [provider]  traZODone (DESYREL) 50 MG tablet Take 0.5 tablets (25 mg total) by mouth at bedtime as needed for sleep. 08/15/20   Loletha Grayer, MD  vitamin B-12 (CYANOCOBALAMIN) 1000 MCG tablet Take 1,000 mcg by mouth daily.    [provider]    Allergies Patient has no known allergies.    Social  History Social History   Tobacco Use   Smoking status: Former   Smokeless tobacco: Never  Substance Use Topics   Alcohol use: No   Drug use: No    Review of Systems Patient denies headaches, rhinorrhea, blurry vision, numbness, shortness of breath, chest pain, edema, cough, abdominal pain, nausea, vomiting, diarrhea, dysuria, fevers, rashes or hallucinations unless otherwise stated above in HPI. ____________________________________________   PHYSICAL EXAM:  VITAL SIGNS: Vitals:   12/19/20 1249 12/19/20 1330  BP: 92/64 (!) 91/59  Pulse: 99 100  Resp:  16  Temp:    SpO2: 95% 96%    Constitutional: Alert and oriented. Well appearing and in no acute distress. Eyes: Conjunctivae are normal.  Head: Atraumatic. Nose: No congestion/rhinnorhea. Mouth/Throat: Mucous membranes are moist.   Neck: Painless ROM.  Cardiovascular:   Good peripheral circulation. Respiratory: Normal respiratory effort.  No retractions.  Gastrointestinal: Soft and nontender.  Musculoskeletal: No lower extremity tenderness .  No joint effusions. Neurologic:  Normal speech and language. No gross focal neurologic deficits are appreciated.  Skin:  Skin is warm, dry and intact. No rash noted. Psychiatric: Mood and affect are normal. Speech and behavior are normal.  ____________________________________________   LABS (all labs ordered are listed, but only abnormal results are displayed)  Results for orders placed or performed during the hospital encounter of 12/19/20 (from the past 24 hour(s))  CBC     Status: Abnormal   Collection Time: 12/19/20 12:15 PM  Result Value Ref Range   WBC 11.1 (H) 4.0 - 10.5 K/uL   RBC 3.95 3.87 - 5.11 MIL/uL   Hemoglobin 13.8 12.0 - 15.0 g/dL   HCT 41.8 36.0 - 46.0 %   MCV 105.8 (H) 80.0 - 100.0 fL   MCH 34.9 (H) 26.0 - 34.0 pg   MCHC 33.0 30.0 - 36.0 g/dL   RDW 15.7 (H) 11.5 - 15.5 %   Platelets 240 150 - 400 K/uL   nRBC 0.0 0.0 - 0.2 %  Basic metabolic panel      Status: Abnormal   Collection Time: 12/19/20 12:15 PM  Result Value Ref Range   Sodium 136 135 - 145 mmol/L   Potassium 4.3 3.5 - 5.1 mmol/L   Chloride 105 98 - 111 mmol/L   CO2 25 22 - 32 mmol/L   Glucose, Bld 153 (H) 70 - 99 mg/dL   BUN 25 (H) 8 - 23 mg/dL   Creatinine, Ser 1.01 (H) 0.44 - 1.00 mg/dL   Calcium 9.2 8.9 - 10.3 mg/dL   GFR, Estimated 55 (L) >60 mL/min   Anion gap 6 5 - 15   ____________________________________________  EKG____________________________________________  RADIOLOGY   ____________________________________________   PROCEDURES  Procedure(s) performed:  Marland KitchenMarland KitchenLaceration Repair  Date/Time: 12/19/2020 1:41 PM Performed by: Merlyn Lot, MD Authorized by: Merlyn Lot, MD   Consent:    Consent obtained:  Verbal  Consent given by:  Patient   Risks discussed:  Infection, pain, retained foreign body, poor cosmetic result and poor wound healing Anesthesia:    Anesthesia method:  Local infiltration   Local anesthetic:  Lidocaine 1% w/o epi Laceration details:    Location:  Face   Face location:  R eyebrow   Length (cm):  0.5   Depth (mm):  0.5 Exploration:    Hemostasis achieved with:  Direct pressure   Contaminated: no   Treatment:    Area cleansed with:  Saline   Amount of cleaning:  Standard   Irrigation solution:  Sterile saline   Visualized foreign bodies/material removed: no   Skin repair:    Repair method:  Sutures   Suture size:  6-0   Suture material:  Nylon   Suture technique:  Figure eight Approximation:    Approximation:  Close Repair type:    Repair type:  Simple Post-procedure details:    Dressing:  Sterile dressing   Procedure completion:  Tolerated well, no immediate complications    Critical Care performed: no ____________________________________________   INITIAL IMPRESSION / ASSESSMENT AND PLAN / ED COURSE  Pertinent labs & imaging results that were available during my care of the patient were reviewed  by me and considered in my medical decision making (see chart for details).   DDX: Laceration, arterial bleed, abrasion  Angela Leonard is a 86 y.o. who presents to the ED with presentation as described above.  Patient with persistent oozing from the lateral inferior most aspect of the lack repair yesterday.  Patient prepped and after lidocaine with epi had small residual defect that was source of persistent oozing figure-of-eight stitch placed with hemostasis controlled.  At her baseline.  Does appear stable appropriate for outpatient follow-up.   The patient was evaluated in Emergency Department today for the symptoms described in the history of present illness. He/she was evaluated in the context of the global COVID-19 pandemic, which necessitated consideration that the patient might be at risk for infection with the SARS-CoV-2 virus that causes COVID-19. Institutional protocols and algorithms that pertain to the evaluation of patients at risk for COVID-19 are in a state of rapid change based on information released by regulatory bodies including the CDC and federal and state organizations. These policies and algorithms were followed during the patient's care in the ED.    ____________________________________________   FINAL CLINICAL IMPRESSION(S) / ED DIAGNOSES  Final diagnoses:  Laceration of right eyebrow, initial encounter      NEW MEDICATIONS STARTED DURING THIS VISIT:  New Prescriptions   No medications on file     Note:  This document was prepared using Dragon voice recognition software and may include unintentional dictation errors.     Merlyn Lot, MD 12/19/20 1345

## 2020-12-19 NOTE — ED Triage Notes (Signed)
Pt comes into the ED via EMS from home with c/o bleeding from a suture wound on the forehead , pt was seen here yesterday after a fall and had sutures placed, states the wound has been bleeding since around 5am, clean bandages in place on arrival from where the first responder placed. Pt is alert and sitting up in the chair at present. Pt is on eliquis

## 2020-12-19 NOTE — ED Notes (Signed)
No answer when called from lobby 

## 2020-12-19 NOTE — ED Notes (Signed)
No answer when called from lobby; no answer when phone # listed in chart called

## 2020-12-27 ENCOUNTER — Other Ambulatory Visit: Payer: Self-pay

## 2020-12-27 ENCOUNTER — Emergency Department
Admission: EM | Admit: 2020-12-27 | Discharge: 2020-12-27 | Disposition: A | Discharge status: Home / Self Care | Payer: Medicare Other | Attending: Emergency Medicine | Admitting: Emergency Medicine

## 2020-12-27 ENCOUNTER — Encounter: Payer: Self-pay | Admitting: Emergency Medicine

## 2020-12-27 DIAGNOSIS — I4891 Unspecified atrial fibrillation: Secondary | ICD-10-CM | POA: Diagnosis not present

## 2020-12-27 DIAGNOSIS — W19XXXD Unspecified fall, subsequent encounter: Secondary | ICD-10-CM | POA: Diagnosis not present

## 2020-12-27 DIAGNOSIS — Z87891 Personal history of nicotine dependence: Secondary | ICD-10-CM | POA: Diagnosis not present

## 2020-12-27 DIAGNOSIS — F0390 Unspecified dementia without behavioral disturbance: Secondary | ICD-10-CM | POA: Diagnosis not present

## 2020-12-27 DIAGNOSIS — Z4802 Encounter for removal of sutures: Secondary | ICD-10-CM | POA: Diagnosis not present

## 2020-12-27 DIAGNOSIS — Z79899 Other long term (current) drug therapy: Secondary | ICD-10-CM | POA: Insufficient documentation

## 2020-12-27 DIAGNOSIS — I10 Essential (primary) hypertension: Secondary | ICD-10-CM | POA: Insufficient documentation

## 2020-12-27 DIAGNOSIS — S0591XD Unspecified injury of right eye and orbit, subsequent encounter: Secondary | ICD-10-CM | POA: Diagnosis present

## 2020-12-27 DIAGNOSIS — Z7901 Long term (current) use of anticoagulants: Secondary | ICD-10-CM | POA: Insufficient documentation

## 2020-12-27 DIAGNOSIS — S01111D Laceration without foreign body of right eyelid and periocular area, subsequent encounter: Secondary | ICD-10-CM | POA: Diagnosis not present

## 2020-12-27 NOTE — ED Provider Notes (Signed)
[facility] Emergency Department Provider Note  ____________________________________________   Event Date/Time   First MD Initiated Contact with Patient 12/27/20 1153     (approximate)  I have reviewed the triage vital signs and the nursing notes.   HISTORY  Chief Complaint Suture / Staple Removal   HPI Angela Leonard is a 86 y.o. female with a past medical history of HTN and HDL as well as recent fall sustained laceration over right eyebrow on 7/25 seen the following day on 7/26 for some persistent oozing with placement of a nonabsorbable suture in figure-of-eight presents accompanied by caregiver for stitch removal.  Patient states he has no interim falls or injuries, headache, vision changes, fevers, cough, chest pain shortness of breath or any other acute sick symptoms.  No other acute concerns per caregiver at bedside.  No subsequent bleeding.         Past Medical History:  Diagnosis Date   HLD (hyperlipidemia)    Hypertension     Patient Active Problem List   Diagnosis Date Noted   Right hip pain 11/29/2020   Atrial fibrillation with RVR (HCC)    Hypotension    Mass of skin of head    Dementia without behavioral disturbance ([place])    Closed hip fracture requiring operative repair, left, sequela 08/11/2020   Dehydration 08/25/2019   Acute lower UTI 08/24/2019   Fall at home, initial encounter 08/24/2019   Hypokalemia 08/24/2019   RA (rheumatoid arthritis) ([place]) 08/24/2019   Fall 08/24/2019   Facial laceration    Chronic atrial fibrillation, unspecified ([place]) 02/19/2019   Sepsis ([place]) 08/03/2018   Atypical pneumonia 08/03/2018   Arthritis of right hip 08/03/2018   HTN (hypertension) 08/03/2018   HLD (hyperlipidemia) 08/03/2018   AKI (acute kidney injury) ([place]) 09/15/2017   Symptomatic cholelithiasis 10/13/2014    Past Surgical History:  Procedure Laterality Date   BACK SURGERY     CHOLECYSTECTOMY N/A 10/14/2014   Procedure:  LAPAROSCOPIC CHOLECYSTECTOMY;  Surgeon: Marlyce Huge, MD;  Location: [facility] ORS;  Service: General;  Laterality: N/A;   COLON SURGERY     HIP PINNING,CANNULATED Left 08/12/2020   Procedure: CANNULATED HIP PINNING;  Surgeon: Earnestine Leys, MD;  Location: [facility] ORS;  Service: Orthopedics;  Laterality: Left;    Prior to Admission medications   Medication Sig Start Date End Date Taking? Authorizing Provider  acetaminophen (TYLENOL) 325 MG tablet Take 2 tablets (650 mg total) by mouth every 6 (six) hours as needed for mild pain or moderate pain (pain score 1-3 or temp > 100.5). 08/15/20   Loletha Grayer, MD  apixaban (ELIQUIS) 2.5 MG TABS tablet Take 2.5 mg by mouth 2 (two) times daily.    [provider]  cholecalciferol (VITAMIN D) 1000 units tablet Take 1,000 Units by mouth daily.    [provider]  diltiazem (TIAZAC) 180 MG 24 hr capsule Take 180 mg by mouth daily. 11/06/20   [provider]  ferrous sulfate 325 (65 FE) MG tablet Take 1 tablet (325 mg total) by mouth daily with breakfast. 08/15/20   Loletha Grayer, MD  folic acid (FOLVITE) 1 MG tablet Take 1 mg by mouth daily.    [provider]  HYDROcodone-acetaminophen (NORCO) 7.5-325 MG tablet Take 1 tablet by mouth every 6 (six) hours as needed for severe pain (pain score 7-10). 08/15/20   Loletha Grayer, MD  methotrexate (RHEUMATREX) 2.5 MG tablet Take 7 tablets by mouth every Tuesday.     [provider]  mirtazapine (REMERON) 15 MG tablet Take 15 mg by mouth at bedtime. 07/14/20   [provider]  multivitamin-lutein (OCUVITE-LUTEIN) CAPS capsule Take 1 capsule by mouth 2 (two) times daily. 08/15/20   Loletha Grayer, MD  ramipril (ALTACE) 5 MG capsule Take 5 mg by mouth daily. 10/11/20   [provider]  traZODone (DESYREL) 50 MG tablet Take 0.5 tablets (25 mg total) by mouth at bedtime as needed for sleep. 08/15/20   Loletha Grayer, MD  vitamin B-12 (CYANOCOBALAMIN)  1000 MCG tablet Take 1,000 mcg by mouth daily.    [provider]    Allergies Patient has no known allergies.  Family History  Problem Relation Age of Onset   Alzheimer's disease Mother    Parkinson's disease Mother    Cancer Father    Heart disease Sister    Parkinson's disease Brother    Heart disease Brother     Social History Social History   Tobacco Use   Smoking status: Former   Smokeless tobacco: Never  Substance Use Topics   Alcohol use: No   Drug use: No    Review of Systems  Review of Systems  Constitutional:  Negative for chills and fever.  HENT:  Negative for sore throat.   Eyes:  Negative for pain.  Respiratory:  Negative for cough and stridor.   Cardiovascular:  Negative for chest pain.  Gastrointestinal:  Negative for vomiting.  Genitourinary:  Negative for dysuria.  Musculoskeletal:  Negative for myalgias.  Skin:  Negative for rash.  Neurological:  Negative for seizures, loss of consciousness and headaches.  Psychiatric/Behavioral:  Negative for suicidal ideas.   All other systems reviewed and are negative.    ____________________________________________   PHYSICAL EXAM:  VITAL SIGNS: ED Triage Vitals  Enc Vitals Group     BP --      Pulse Rate 12/27/20 1133 (!) 108     Resp 12/27/20 1133 16     Temp 12/27/20 1133 98.6 F (37 C)     Temp Source 12/27/20 1133 Oral     SpO2 12/27/20 1133 95 %     Weight 12/27/20 1131 163 lb 2.3 oz (74 kg)     Height 12/27/20 1131 5\' 5"  (1.651 m)     Head Circumference --      Peak Flow --      Pain Score 12/27/20 1131 0     Pain Loc --      Pain Edu? --      Excl. in [place]? --    Vitals:   12/27/20 1133  Pulse: (!) 108  Resp: 16  Temp: 98.6 F (37 C)  SpO2: 95%   Physical Exam Vitals and nursing note reviewed.  Constitutional:      General: She is not in acute distress.    Appearance: She is well-developed.  HENT:     Head: Normocephalic and atraumatic.     Right Ear: External ear  normal.     Left Ear: External ear normal.     Nose: Nose normal.     Mouth/Throat:     Mouth: Mucous membranes are moist.  Eyes:     Conjunctiva/sclera: Conjunctivae normal.  Cardiovascular:     Rate and Rhythm: Normal rate and regular rhythm.     Pulses: Normal pulses.     Heart sounds: No murmur heard. Pulmonary:     Effort: Pulmonary effort is normal. No respiratory distress.  Abdominal:     Palpations: Abdomen is soft.  Tenderness: There is no abdominal tenderness.  Musculoskeletal:     Cervical back: Neck supple.     Right lower leg: No edema.     Left lower leg: No edema.  Skin:    General: Skin is warm and dry.     Capillary Refill: Capillary refill takes less than 2 seconds.  Neurological:     Mental Status: She is alert and oriented to person, place, and time.  Psychiatric:        Mood and Affect: Mood normal.    Laceration of right eyebrow appears to be healing appropriately.  There are some surrounding ecchymosis also.  Healing appropriately.  PERRLA.  EOMI.  Some absorbable sutures are visible as well as glue mixed in with a scab.  1 black nonabsorbable suture visualized. ____________________________________________   LABS (all labs ordered are listed, but only abnormal results are displayed)  Labs Reviewed - No data to display ____________________________________________  EKG  ____________________________________________  RADIOLOGY  ED MD interpretation:    Official radiology report(s): No results found.  ____________________________________________   PROCEDURES  Procedure(s) performed (including Critical Care):  .Suture Removal  Date/Time: 12/27/2020 12:27 PM Performed by: Lucrezia Starch, MD Authorized by: Lucrezia Starch, MD   Consent:    Consent obtained:  Verbal   Risks discussed:  Pain and bleeding   Alternatives discussed:  No treatment Universal protocol:    Patient identity confirmed:  Verbally with patient Location:     Location:  Head/neck   Head/neck location:  Eyelid Procedure details:    Wound appearance:  No signs of infection and clean   Number of sutures removed:  1 Post-procedure details:    Procedure completion:  Tolerated well, no immediate complications   ____________________________________________   INITIAL IMPRESSION / ASSESSMENT AND PLAN / ED COURSE      Presents with above to history exam for suture removal placed as noted above after initial visit but normal surgical site did not stem oozing.  Cystoscopy tachycardic with otherwise stable vital signs on arrival.  She denies any acute complaints and wound appears to be healing appropriately.  Suture removed per procedure note above.  Given persistent glue and possibility of nonabsorbable suture stuck in glue and around scab did recommend she follow-up with her PCP for additional wound check.  Caregiver and patient are amenable with plan.Discharged in stable condition.   ____________________________________________   FINAL CLINICAL IMPRESSION(S) / ED DIAGNOSES  Final diagnoses:  Visit for suture removal    Medications - No data to display   ED Discharge Orders     None        Note:  This document was prepared using Dragon voice recognition software and may include unintentional dictation errors.    Lucrezia Starch, MD 12/27/20 1228

## 2020-12-27 NOTE — ED Triage Notes (Signed)
Pt to ED via POV with caregiver for suture removal, pt is in NAD.

## 2021-02-13 ENCOUNTER — Other Ambulatory Visit: Payer: Self-pay | Admitting: Family Medicine

## 2021-02-13 DIAGNOSIS — Z780 Asymptomatic menopausal state: Secondary | ICD-10-CM

## 2021-08-30 ENCOUNTER — Other Ambulatory Visit: Payer: Self-pay

## 2021-08-30 ENCOUNTER — Emergency Department: Payer: Medicare Other

## 2021-08-30 ENCOUNTER — Inpatient Hospital Stay
Admission: EM | Admit: 2021-08-30 | Discharge: 2021-09-10 | DRG: 189 | Disposition: A | Discharge status: Skilled Nursing Facility | Payer: Medicare Other | Attending: Internal Medicine | Admitting: Internal Medicine

## 2021-08-30 ENCOUNTER — Observation Stay: Payer: Medicare Other

## 2021-08-30 DIAGNOSIS — M1611 Unilateral primary osteoarthritis, right hip: Secondary | ICD-10-CM | POA: Diagnosis present

## 2021-08-30 DIAGNOSIS — N390 Urinary tract infection, site not specified: Secondary | ICD-10-CM | POA: Diagnosis present

## 2021-08-30 DIAGNOSIS — Z9049 Acquired absence of other specified parts of digestive tract: Secondary | ICD-10-CM

## 2021-08-30 DIAGNOSIS — I482 Chronic atrial fibrillation, unspecified: Secondary | ICD-10-CM | POA: Diagnosis present

## 2021-08-30 DIAGNOSIS — E43 Unspecified severe protein-calorie malnutrition: Secondary | ICD-10-CM | POA: Insufficient documentation

## 2021-08-30 DIAGNOSIS — R0602 Shortness of breath: Secondary | ICD-10-CM | POA: Diagnosis not present

## 2021-08-30 DIAGNOSIS — F0390 Unspecified dementia without behavioral disturbance: Secondary | ICD-10-CM | POA: Diagnosis present

## 2021-08-30 DIAGNOSIS — J439 Emphysema, unspecified: Secondary | ICD-10-CM | POA: Diagnosis present

## 2021-08-30 DIAGNOSIS — Z87891 Personal history of nicotine dependence: Secondary | ICD-10-CM

## 2021-08-30 DIAGNOSIS — C4490 Unspecified malignant neoplasm of skin, unspecified: Secondary | ICD-10-CM | POA: Diagnosis present

## 2021-08-30 DIAGNOSIS — Z7901 Long term (current) use of anticoagulants: Secondary | ICD-10-CM

## 2021-08-30 DIAGNOSIS — I1310 Hypertensive heart and chronic kidney disease without heart failure, with stage 1 through stage 4 chronic kidney disease, or unspecified chronic kidney disease: Secondary | ICD-10-CM | POA: Diagnosis present

## 2021-08-30 DIAGNOSIS — J9601 Acute respiratory failure with hypoxia: Secondary | ICD-10-CM | POA: Diagnosis not present

## 2021-08-30 DIAGNOSIS — Z8249 Family history of ischemic heart disease and other diseases of the circulatory system: Secondary | ICD-10-CM

## 2021-08-30 DIAGNOSIS — D72829 Elevated white blood cell count, unspecified: Secondary | ICD-10-CM | POA: Diagnosis present

## 2021-08-30 DIAGNOSIS — R8281 Pyuria: Secondary | ICD-10-CM | POA: Insufficient documentation

## 2021-08-30 DIAGNOSIS — R531 Weakness: Secondary | ICD-10-CM

## 2021-08-30 DIAGNOSIS — R296 Repeated falls: Secondary | ICD-10-CM

## 2021-08-30 DIAGNOSIS — M069 Rheumatoid arthritis, unspecified: Secondary | ICD-10-CM | POA: Diagnosis present

## 2021-08-30 DIAGNOSIS — I10 Essential (primary) hypertension: Secondary | ICD-10-CM | POA: Diagnosis present

## 2021-08-30 DIAGNOSIS — Z681 Body mass index (BMI) 19 or less, adult: Secondary | ICD-10-CM

## 2021-08-30 DIAGNOSIS — Z79899 Other long term (current) drug therapy: Secondary | ICD-10-CM

## 2021-08-30 DIAGNOSIS — Z85828 Personal history of other malignant neoplasm of skin: Secondary | ICD-10-CM

## 2021-08-30 DIAGNOSIS — Z8616 Personal history of COVID-19: Secondary | ICD-10-CM | POA: Diagnosis present

## 2021-08-30 DIAGNOSIS — R0902 Hypoxemia: Principal | ICD-10-CM

## 2021-08-30 DIAGNOSIS — U071 COVID-19: Secondary | ICD-10-CM | POA: Diagnosis present

## 2021-08-30 DIAGNOSIS — N1831 Chronic kidney disease, stage 3a: Secondary | ICD-10-CM | POA: Diagnosis present

## 2021-08-30 DIAGNOSIS — E785 Hyperlipidemia, unspecified: Secondary | ICD-10-CM | POA: Diagnosis present

## 2021-08-30 LAB — URINALYSIS, ROUTINE W REFLEX MICROSCOPIC
Bilirubin Urine: NEGATIVE
Glucose, UA: NEGATIVE mg/dL
Ketones, ur: 5 mg/dL — AB
Nitrite: POSITIVE — AB
Protein, ur: 30 mg/dL — AB
Specific Gravity, Urine: 1.028 (ref 1.005–1.030)
WBC, UA: >50 WBC/hpf — ABNORMAL HIGH (ref 0–5)
pH: 5 (ref 5.0–8.0)

## 2021-08-30 LAB — HEPATIC FUNCTION PANEL
ALT: 19 U/L (ref 0–44)
AST: 43 U/L — ABNORMAL HIGH (ref 15–41)
Albumin: 2.7 g/dL — ABNORMAL LOW (ref 3.5–5.0)
Alkaline Phosphatase: 94 U/L (ref 38–126)
Bilirubin, Direct: 0.2 mg/dL (ref 0.0–0.2)
Indirect Bilirubin: 0.9 mg/dL (ref 0.3–0.9)
Total Bilirubin: 1.1 mg/dL (ref 0.3–1.2)
Total Protein: 8.4 g/dL — ABNORMAL HIGH (ref 6.5–8.1)

## 2021-08-30 LAB — BASIC METABOLIC PANEL
Anion gap: 13 (ref 5–15)
BUN: 27 mg/dL — ABNORMAL HIGH (ref 8–23)
CO2: 20 mmol/L — ABNORMAL LOW (ref 22–32)
Calcium: 9.3 mg/dL (ref 8.9–10.3)
Chloride: 102 mmol/L (ref 98–111)
Creatinine, Ser: 0.97 mg/dL (ref 0.44–1.00)
GFR, Estimated: 58 mL/min — ABNORMAL LOW (ref >60)
Glucose, Bld: 117 mg/dL — ABNORMAL HIGH (ref 70–99)
Potassium: 3.6 mmol/L (ref 3.5–5.1)
Sodium: 135 mmol/L (ref 135–145)

## 2021-08-30 LAB — CBC
HCT: 47.9 % — ABNORMAL HIGH (ref 36.0–46.0)
Hemoglobin: 15.7 g/dL — ABNORMAL HIGH (ref 12.0–15.0)
MCH: 33.8 pg (ref 26.0–34.0)
MCHC: 32.8 g/dL (ref 30.0–36.0)
MCV: 103 fL — ABNORMAL HIGH (ref 80.0–100.0)
Platelets: 267 10*3/uL (ref 150–400)
RBC: 4.65 MIL/uL (ref 3.87–5.11)
RDW: 14.2 % (ref 11.5–15.5)
WBC: 10.7 10*3/uL — ABNORMAL HIGH (ref 4.0–10.5)
nRBC: 0 % (ref 0.0–0.2)

## 2021-08-30 LAB — PROCALCITONIN: Procalcitonin: <0.1 ng/mL

## 2021-08-30 LAB — LACTIC ACID, PLASMA
Lactic Acid, Venous: 1.2 mmol/L (ref 0.5–1.9)
Lactic Acid, Venous: 1.4 mmol/L (ref 0.5–1.9)

## 2021-08-30 LAB — D-DIMER, QUANTITATIVE: D-Dimer, Quant: 5.76 ug/mL-FEU — ABNORMAL HIGH (ref 0.00–0.50)

## 2021-08-30 LAB — RESP PANEL BY RT-PCR (FLU A&B, COVID) ARPGX2
Influenza A by PCR: NEGATIVE
Influenza B by PCR: NEGATIVE
SARS Coronavirus 2 by RT PCR: POSITIVE — AB

## 2021-08-30 LAB — TSH: TSH: 2.146 u[IU]/mL (ref 0.350–4.500)

## 2021-08-30 LAB — TROPONIN I (HIGH SENSITIVITY)
Troponin I (High Sensitivity): 14 ng/L (ref <18)
Troponin I (High Sensitivity): 12 ng/L (ref <18)

## 2021-08-30 LAB — BRAIN NATRIURETIC PEPTIDE: B Natriuretic Peptide: 129.9 pg/mL — ABNORMAL HIGH (ref 0.0–100.0)

## 2021-08-30 MED ORDER — APIXABAN 2.5 MG PO TABS
2.5000 mg | ORAL_TABLET | Freq: Two times a day (BID) | ORAL | Status: DC
  Administered 2021-08-30: 2.5 mg via ORAL
  Filled 2021-08-30: qty 1

## 2021-08-30 MED ORDER — VITAMIN B-12 1000 MCG PO TABS
1000.0000 ug | ORAL_TABLET | Freq: Every day | ORAL | Status: DC
  Administered 2021-08-31: 1000 ug via ORAL
  Filled 2021-08-30: qty 1

## 2021-08-30 MED ORDER — METHOTREXATE 2.5 MG PO TABS
17.5000 mg | ORAL_TABLET | ORAL | Status: DC
  Administered 2021-09-04: 17.5 mg via ORAL
  Filled 2021-08-30: qty 7

## 2021-08-30 MED ORDER — SODIUM CHLORIDE 0.9 % IV SOLN: INTRAVENOUS | Status: AC

## 2021-08-30 MED ORDER — APIXABAN 2.5 MG PO TABS
2.5000 mg | ORAL_TABLET | Freq: Once | ORAL | Status: AC
  Administered 2021-08-30: 2.5 mg via ORAL
  Filled 2021-08-30: qty 1

## 2021-08-30 MED ORDER — ONDANSETRON HCL 4 MG PO TABS: 4.0000 mg | ORAL_TABLET | Freq: Four times a day (QID) | ORAL | Status: DC | PRN

## 2021-08-30 MED ORDER — APIXABAN 2.5 MG PO TABS
2.5000 mg | ORAL_TABLET | Freq: Two times a day (BID) | ORAL | Status: DC
  Administered 2021-08-31: 2.5 mg via ORAL
  Filled 2021-08-30: qty 1

## 2021-08-30 MED ORDER — DILTIAZEM HCL ER BEADS 180 MG PO CP24
180.0000 mg | ORAL_CAPSULE | Freq: Every day | ORAL | Status: DC
Start: 2021-08-31 — End: 2021-08-31

## 2021-08-30 MED ORDER — TRAZODONE HCL 50 MG PO TABS
25.0000 mg | ORAL_TABLET | Freq: Every evening | ORAL | Status: DC | PRN
  Administered 2021-08-30 – 2021-09-09 (×6): 25 mg via ORAL
  Filled 2021-08-30 (×6): qty 1

## 2021-08-30 MED ORDER — VITAMIN D 25 MCG (1000 UNIT) PO TABS
1000.0000 [IU] | ORAL_TABLET | Freq: Every day | ORAL | Status: DC
  Administered 2021-08-31 – 2021-09-09 (×9): 1000 [IU] via ORAL
  Filled 2021-08-30 (×9): qty 1

## 2021-08-30 MED ORDER — OCUVITE-LUTEIN PO CAPS
1.0000 | ORAL_CAPSULE | Freq: Two times a day (BID) | ORAL | Status: DC
  Administered 2021-08-30 – 2021-09-09 (×19): 1 via ORAL
  Filled 2021-08-30 (×24): qty 1

## 2021-08-30 MED ORDER — RAMIPRIL 5 MG PO CAPS: 5.0000 mg | ORAL_CAPSULE | Freq: Every day | ORAL | Status: DC

## 2021-08-30 MED ORDER — HYDROCODONE-ACETAMINOPHEN 7.5-325 MG PO TABS
1.0000 | ORAL_TABLET | Freq: Four times a day (QID) | ORAL | Status: DC | PRN
Start: 2021-08-30 — End: 2021-09-10
  Administered 2021-09-02 – 2021-09-09 (×4): 1 via ORAL
  Filled 2021-08-30 (×4): qty 1

## 2021-08-30 MED ORDER — HYDRALAZINE HCL 20 MG/ML IJ SOLN: 5.0000 mg | Freq: Four times a day (QID) | INTRAMUSCULAR | Status: AC | PRN

## 2021-08-30 MED ORDER — FERROUS SULFATE 325 (65 FE) MG PO TABS
325.0000 mg | ORAL_TABLET | Freq: Every day | ORAL | Status: DC
  Administered 2021-08-31 – 2021-09-09 (×9): 325 mg via ORAL
  Filled 2021-08-30 (×10): qty 1

## 2021-08-30 MED ORDER — MIRTAZAPINE 15 MG PO TABS
15.0000 mg | ORAL_TABLET | Freq: Every day | ORAL | Status: DC
  Administered 2021-08-30: 15 mg via ORAL
  Filled 2021-08-30: qty 1

## 2021-08-30 MED ORDER — ACETAMINOPHEN 325 MG PO TABS: 650.0000 mg | ORAL_TABLET | Freq: Four times a day (QID) | ORAL | Status: DC | PRN

## 2021-08-30 MED ORDER — FOLIC ACID 1 MG PO TABS
1.0000 mg | ORAL_TABLET | Freq: Every day | ORAL | Status: DC
  Administered 2021-08-31 – 2021-09-09 (×9): 1 mg via ORAL
  Filled 2021-08-30 (×10): qty 1

## 2021-08-30 MED ORDER — IOHEXOL 350 MG/ML SOLN
80.0000 mL | Freq: Once | INTRAVENOUS | Status: AC | PRN
Start: 2021-08-30 — End: 2021-08-30
  Administered 2021-08-30: 80 mL via INTRAVENOUS

## 2021-08-30 MED ORDER — SODIUM CHLORIDE 0.9 % IV SOLN
1.0000 g | INTRAVENOUS | Status: DC
  Administered 2021-08-31 – 2021-09-02 (×4): 1 g via INTRAVENOUS
  Filled 2021-08-30 (×3): qty 10
  Filled 2021-08-30: qty 1

## 2021-08-30 MED ORDER — ONDANSETRON HCL 4 MG/2ML IJ SOLN: 4.0000 mg | Freq: Four times a day (QID) | INTRAMUSCULAR | Status: DC | PRN

## 2021-08-30 MED ORDER — ACETAMINOPHEN 650 MG RE SUPP: 650.0000 mg | Freq: Four times a day (QID) | RECTAL | Status: DC | PRN

## 2021-08-30 NOTE — ED Notes (Signed)
Informed RN bed assigned 

## 2021-08-30 NOTE — Assessment & Plan Note (Deleted)
-   Presumed UTI, present on admission ?- With family reported change in mental status/confusion ?- Status post Foley placement in the emergency room as UA was not able to be obtained ?- Ceftriaxone 1 g IV daily ordered ?

## 2021-08-30 NOTE — Assessment & Plan Note (Addendum)
Per H&P: "Daughter states that patient is currently taking Eliquis 5 mg p.o. once daily ?- I have discussed with daughter, daughter states that previously patient was taking Eliquis 5 mg p.o. twice daily, however she recently changed doctors and was advised by the new doctor to take 5 mg p.o. once daily only ?- Per current recommendation, I have changed Eliquis 5 mg p.o. to twice daily" ? ?

## 2021-08-30 NOTE — Assessment & Plan Note (Addendum)
Continue home methotrexate 2.5 mg tablet every Tuesday ?Rollow-up with outpatient rheumatologist ?

## 2021-08-30 NOTE — Assessment & Plan Note (Addendum)
Status post excision at dermatology office and is currently on 5-FU cream daily -resumed ?

## 2021-08-30 NOTE — ED Notes (Signed)
Pt up to ambulate with walker. Pt stated she felt very short of breath and sats dropped to 86%. Pt assisted back to bed and sats went up to 94% on room air.  ?

## 2021-08-30 NOTE — Assessment & Plan Note (Addendum)
Not on statin 

## 2021-08-30 NOTE — H&P (Addendum)
History and Physical   Angela Leonard MVH:[id number] DOB: 1934/12/24 DOA: 08/30/2021  PCP: [facility]  Outpatient Specialists: Dr. Gerrie Nordmann, [facility] Patient coming from: Home  I have personally briefly reviewed patient's old medical records in [facility] EMR.  Chief Concern: Shortness of breath  HPI: Angela Leonard is an 87 year old female with history of rheumatoid arthritis seropositive on methotrexate, insomnia, CKD stage IIIa, osteoarthritis, hypertension, atrial fibrillation on Eliquis, hyperlipidemia, depression, who presents emergency department for chief concerns of shortness of breath.  Initial vitals in the emergency department showed temperature of 97.5, respiration rate of 23, heart rate 92, blood pressure 110/79, SPO2 of 93% on room air.  Serum sodium 135, potassium 3.6, chloride of 102, bicarb of 20, BUN of 27, serum creatinine of 0.97, GFR 58, nonfasting blood glucose 117.  WBC 10.7, hemoglobin 15.7, platelets of 267.  UA has been ordered and pending collection.  High sensitive troponin was 14.  Lactic acid was 1.2.  ED treatment: None  At bedside she was able to tell me her full name, states her age is 87, states that she is at home, and the current year is 2003.  She denies being in any pain, she denies dysuria, hematuria, chest pain.  She states she has been having difficulty breathing for several months now.  Daughter states that patient fell about three times in the last week. Patient has been having labored breathing and care giver advised family to check her spO2 both at rest and with ambulation. At rest, her SpO2 was 93% and ambulation to and from the restroom, 88% on room air.   Daughter reports this has never happened before. Daughter reports increase urination in last week.   Daughter reports that since falling on Thursday, she fell on her hip, she has been as active as normal and increased laying int he bed.   Social history: She  lives at home with daughter, who is also patient's only child. Former tobacco user, quit 20-25 years ago. She denies etoh and recreational drug use.  Vaccination history: she is vaccinated for covid, two doses and not vaccinated for influenza   ROS: Unable to complete as patient has dementia  ED Course: Discussed with emergency medicine provider, patient requiring hospitalization for chief concerns of shortness of breath with reported hypoxia at home.  Assessment/Plan  Principal Problem:   Acute respiratory failure with hypoxia (HCC) Active Problems:   Arthritis of right hip   HTN (hypertension)   HLD (hyperlipidemia)   Chronic atrial fibrillation, unspecified (HCC)   Acute lower UTI   RA (rheumatoid arthritis) (HCC)   Dementia without behavioral disturbance (HCC)   Shortness of breath   History of COVID-19   Pyuria   Skin cancer   Leukocytosis   Assessment and Plan:  * Acute respiratory failure with hypoxia (HCC) - Etiology work-up in progress - Check procalcitonin, B1, B12, TSH - Continue to follow on second high-sensitivity troponin, second lactic acid - Given that patient is likely underdosing her Eliquis, check D-dimer - If D-dimer is positive we will order ultrasound of the lower extremity and a CTA to assess for pulmonary embolism - TOC consulted for DME/oxygen tank on discharge for use with exertion to maintain SPO2 greater than 92%  Leukocytosis - Mild - Presumed secondary to UTI, treat as above - CBC in the a.m.  Skin cancer - Status post removal at dermatology office and is currently on 5-FU cream daily - I have discussed with  pharmacy, we do not have this medication on formulary - I have advised daughter that she will need to bring the cream to the hospital so that we can relabel the medication for patient use  Pyuria - Presumed UTI, present on admission - With family reported change in mental status/confusion - Status post Foley placement in the emergency  room as UA was not able to be obtained - Ceftriaxone 1 g IV daily ordered  History of COVID-19 - History of COVID-19 infection in March 2023  RA (rheumatoid arthritis) (HCC) - Patient is on methotrexate 2.5 mg tablet every Tuesday - Continue follow-up with outpatient rheumatologist  Chronic atrial fibrillation, unspecified (HCC) - Daughter states that patient is currently taking Eliquis 5 mg p.o. once daily - I have discussed with daughter, daughter states that previously patient was taking Eliquis 5 mg p.o. twice daily, however she recently changed doctors and was advised by the new doctor to take 5 mg p.o. once daily only - Per current recommendation, I have changed Eliquis 5 mg p.o. to twice daily  HLD (hyperlipidemia) - Currently not taking statin medication  HTN (hypertension) - Outpatient medications: Diltiazem 180 milligram daily, ramipril 10 mg daily resumed - A.m. team to complete med reconciliation  AM team to complete med reconciliation.  Addendum at approximately 2300: D-dimer markedly elevated to 5.76.  Given patient has been underdosing Eliquis and family reported hypoxia, ultrasound of the lower extremity and CTA of the chest has been ordered to assess for DVT and PE, respectively. - Sodium chloride IVF at 125 mL/h ordered for 16 hours for prophylactic prevention of contrast-induced nephropathy - BMP in the a.m. - Sent secure message to nursing staff updating patient's plan  Chart reviewed.   DVT prophylaxis: Apixaban Code Status: full code  Diet: Heart healthy Family Communication: Updated daughter Angela Leonard about at [phone] Disposition Plan: Pending clinical course Consults called: None at this time Admission status: Telemetry cardiac, observation  Past Medical History:  Diagnosis Date   HLD (hyperlipidemia)    Hypertension    Past Surgical History:  Procedure Laterality Date   BACK SURGERY     CHOLECYSTECTOMY N/A 10/14/2014   Procedure:  LAPAROSCOPIC CHOLECYSTECTOMY;  Surgeon: Ida Rogue, MD;  Location: [facility] ORS;  Service: General;  Laterality: N/A;   COLON SURGERY     HIP PINNING,CANNULATED Left 08/12/2020   Procedure: CANNULATED HIP PINNING;  Surgeon: Deeann Saint, MD;  Location: [facility] ORS;  Service: Orthopedics;  Laterality: Left;   Social History:  reports that she has quit smoking. She has never used smokeless tobacco. She reports that she does not drink alcohol and does not use drugs.  No Known Allergies Family History  Problem Relation Age of Onset   Alzheimer's disease Mother    Parkinson's disease Mother    Cancer Father    Heart disease Sister    Parkinson's disease Brother    Heart disease Brother    Family history: Family history reviewed and not pertinent.  Prior to Admission medications   Medication Sig Start Date End Date Taking? Authorizing Provider  acetaminophen (TYLENOL) 325 MG tablet Take 2 tablets (650 mg total) by mouth every 6 (six) hours as needed for mild pain or moderate pain (pain score 1-3 or temp > 100.5). 08/15/20   Alford Highland, MD  apixaban (ELIQUIS) 2.5 MG TABS tablet Take 2.5 mg by mouth 2 (two) times daily.    [provider]  cholecalciferol (VITAMIN D) 1000 units tablet Take 1,000 Units by mouth daily.  [provider]  diltiazem (TIAZAC) 180 MG 24 hr capsule Take 180 mg by mouth daily. 11/06/20   [provider]  ferrous sulfate 325 (65 FE) MG tablet Take 1 tablet (325 mg total) by mouth daily with breakfast. 08/15/20   Alford Highland, MD  folic acid (FOLVITE) 1 MG tablet Take 1 mg by mouth daily.    [provider]  HYDROcodone-acetaminophen (NORCO) 7.5-325 MG tablet Take 1 tablet by mouth every 6 (six) hours as needed for severe pain (pain score 7-10). 08/15/20   Alford Highland, MD  methotrexate (RHEUMATREX) 2.5 MG tablet Take 7 tablets by mouth every Tuesday.     [provider]  mirtazapine (REMERON) 15 MG tablet Take  15 mg by mouth at bedtime. 07/14/20   [provider]  multivitamin-lutein (OCUVITE-LUTEIN) CAPS capsule Take 1 capsule by mouth 2 (two) times daily. 08/15/20   Alford Highland, MD  ramipril (ALTACE) 5 MG capsule Take 5 mg by mouth daily. 10/11/20   [provider]  traZODone (DESYREL) 50 MG tablet Take 0.5 tablets (25 mg total) by mouth at bedtime as needed for sleep. 08/15/20   Alford Highland, MD  vitamin B-12 (CYANOCOBALAMIN) 1000 MCG tablet Take 1,000 mcg by mouth daily.    [provider]   Physical Exam: Vitals:   08/30/21 1632 08/30/21 1830 08/30/21 1915 08/30/21 2011  BP:  (!) 129/96 117/74 (!) 86/73  Pulse: 66 75 68 65  Resp: 17 (!) 24 20 15   Temp:    98.3 F (36.8 C)  TempSrc:      SpO2: 94% 98% 95% 98%  Weight:      Height:       Constitutional: appears age-appropriate, frail, NAD, calm, comfortable Eyes: PERRL, lids and conjunctivae normal ENMT: Mucous membranes are moist. Posterior pharynx clear of any exudate or lesions. Age-appropriate dentition.  Mild hearing loss bilaterally Neck: normal, supple, no masses, no thyromegaly Respiratory: clear to auscultation bilaterally, no wheezing, no crackles. Normal respiratory effort. No accessory muscle use.  Cardiovascular: Regular rate and rhythm, no murmurs / rubs / gallops. No extremity edema. 2+ pedal pulses. No carotid bruits.  Abdomen: no tenderness, no masses palpated, no hepatosplenomegaly. Bowel sounds positive.  Musculoskeletal: no clubbing / cyanosis. No joint deformity upper and lower extremities. Good ROM, no contractures, no atrophy. Normal muscle tone.  Skin: no rashes, lesions, ulcers. No induration Neurologic: Sensation intact. Strength 5/5 in all 4.  Psychiatric: Normal judgment and insight. Alert and oriented x self and age somewhat. Normal mood.   EKG: independently reviewed, showing atrial fibrillation with rate of 98, QTc 485  Chest x-ray on Admission: I personally reviewed and I  agree with radiologist reading as below.  DG Pelvis 1-2 Views  Result Date: 08/30/2021 CLINICAL DATA:  Pelvic pain after fall. EXAM: PELVIS - 1-2 VIEW COMPARISON:  None. FINDINGS: There is no evidence of acute pelvic fracture or diastasis. No pelvic bone lesions are seen. Status post surgical fixation of old proximal left femoral fracture. Moderate degenerative joint disease of right hip is noted. IMPRESSION: No acute abnormality seen in the pelvis. Electronically Signed   By: Lupita Raider M.D.   On: 08/30/2021 16:24   CT HEAD WO CONTRAST ( )  Addendum Date: 08/30/2021   ADDENDUM REPORT: 08/30/2021 16:18 ADDENDUM: With additional review, old fractures of the RIGHT vertebral arch of C4 are identified. These appear corticated and are unchanged from the previous exam. No new fractures identified. Findings called to Greig Right PA on  08/30/2021 at 1617 hours. Electronically Signed   By: Ulyses Southward M.D.   On: 08/30/2021 16:18   Result Date: 08/30/2021 CLINICAL DATA:  Neck trauma, desaturation with activity, had COVID-19 1 month ago, history hypertension, atrial fibrillation, dementia EXAM: CT HEAD WITHOUT CONTRAST CT CERVICAL SPINE WITHOUT CONTRAST TECHNIQUE: Multidetector CT imaging of the head and cervical spine was performed following the standard protocol without intravenous contrast. Multiplanar CT image reconstructions of the cervical spine were also generated. RADIATION DOSE REDUCTION: This exam was performed according to the departmental dose-optimization program which includes automated exposure control, adjustment of the mA and/or kV according to patient size and/or use of iterative reconstruction technique. COMPARISON:  CT head 12/18/2020, CT cervical spine 08/24/2019 FINDINGS: CT HEAD FINDINGS Brain: Generalized atrophy. Ex vacuo dilatation of the ventricular system similar to prior exam. No midline shift or mass effect. Small vessel chronic ischemic changes of deep cerebral white matter. No  intracranial hemorrhage, mass lesion, or evidence of acute infarction. No extra-axial fluid collections. Vascular: Atherosclerotic calcification of internal carotid arteries at skull base Skull: Demineralized but intact Sinuses/Orbits: Clear Other: N/A CT CERVICAL SPINE FINDINGS Alignment: Mild anterolisthesis C2-C3 and C7-T1 with mild retrolisthesis of C4-C5 and C5-C6 unchanged. Remaining alignments normal. Skull base and vertebrae: Osseous demineralization. Skull base intact. Vertebral body heights maintained. Multilevel disc space narrowing and endplate spur formation. Multilevel facet degenerative changes. No fracture, subluxation, or bone destruction. Soft tissues and spinal canal: Atherosclerotic calcifications at the carotid bifurcations and proximal great vessels. Prevertebral soft tissues normal thickness. No other soft tissue abnormalities. Disc levels: Multilevel endplate spur formation and scattered retrolisthesis with AP narrowing of spinal canal at C4-C5, multifactorial. Few scattered bulging discs. Upper chest: Emphysematous changes at apices Other: N/A IMPRESSION: Atrophy with small vessel chronic ischemic changes of deep cerebral white matter. No acute intracranial abnormalities. Multilevel degenerative disc and facet disease changes of the cervical spine. No acute cervical spine abnormalities. Electronically Signed: By: Ulyses Southward M.D. On: 08/30/2021 15:53   CT Cervical Spine Wo Contrast  Addendum Date: 08/30/2021   ADDENDUM REPORT: 08/30/2021 16:18 ADDENDUM: With additional review, old fractures of the RIGHT vertebral arch of C4 are identified. These appear corticated and are unchanged from the previous exam. No new fractures identified. Findings called to Greig Right PA on 08/30/2021 at 1617 hours. Electronically Signed   By: Ulyses Southward M.D.   On: 08/30/2021 16:18   Result Date: 08/30/2021 CLINICAL DATA:  Neck trauma, desaturation with activity, had COVID-19 1 month ago, history  hypertension, atrial fibrillation, dementia EXAM: CT HEAD WITHOUT CONTRAST CT CERVICAL SPINE WITHOUT CONTRAST TECHNIQUE: Multidetector CT imaging of the head and cervical spine was performed following the standard protocol without intravenous contrast. Multiplanar CT image reconstructions of the cervical spine were also generated. RADIATION DOSE REDUCTION: This exam was performed according to the departmental dose-optimization program which includes automated exposure control, adjustment of the mA and/or kV according to patient size and/or use of iterative reconstruction technique. COMPARISON:  CT head 12/18/2020, CT cervical spine 08/24/2019 FINDINGS: CT HEAD FINDINGS Brain: Generalized atrophy. Ex vacuo dilatation of the ventricular system similar to prior exam. No midline shift or mass effect. Small vessel chronic ischemic changes of deep cerebral white matter. No intracranial hemorrhage, mass lesion, or evidence of acute infarction. No extra-axial fluid collections. Vascular: Atherosclerotic calcification of internal carotid arteries at skull base Skull: Demineralized but intact Sinuses/Orbits: Clear Other: N/A CT CERVICAL SPINE FINDINGS Alignment: Mild anterolisthesis C2-C3 and C7-T1 with mild retrolisthesis of C4-C5  and C5-C6 unchanged. Remaining alignments normal. Skull base and vertebrae: Osseous demineralization. Skull base intact. Vertebral body heights maintained. Multilevel disc space narrowing and endplate spur formation. Multilevel facet degenerative changes. No fracture, subluxation, or bone destruction. Soft tissues and spinal canal: Atherosclerotic calcifications at the carotid bifurcations and proximal great vessels. Prevertebral soft tissues normal thickness. No other soft tissue abnormalities. Disc levels: Multilevel endplate spur formation and scattered retrolisthesis with AP narrowing of spinal canal at C4-C5, multifactorial. Few scattered bulging discs. Upper chest: Emphysematous changes at  apices Other: N/A IMPRESSION: Atrophy with small vessel chronic ischemic changes of deep cerebral white matter. No acute intracranial abnormalities. Multilevel degenerative disc and facet disease changes of the cervical spine. No acute cervical spine abnormalities. Electronically Signed: By: Ulyses Southward M.D. On: 08/30/2021 15:53   DG Chest Portable 1 View  Result Date: 08/30/2021 CLINICAL DATA:  Shortness of breath.  COVID 1 month ago. EXAM: PORTABLE CHEST 1 VIEW COMPARISON:  08/11/2020 FINDINGS: Stable upper normal heart size. Rounded left hilar density may be related to vascular structures and patient rotation, but was not seen on prior exam. Otherwise unchanged mediastinal contours with atherosclerosis. Chronic interstitial coarsening and interstitial thickening is stable in appearance from prior exam, right suprahilar predominant. There is no superimposed acute airspace disease. No evidence of pulmonary edema. No pleural fluid or pneumothorax. The bones are under mineralized. IMPRESSION: 1. Chronic interstitial coarsening and interstitial thickening, possible chronic interstitial lung disease. 2. Rounded left hilar density may be related to vascular structures and patient rotation, but was not seen on prior exam. Recommend follow-up PA and lateral views when patient is able. Electronically Signed   By: Narda Rutherford M.D.   On: 08/30/2021 15:32   DG Femur Min 2 Views Left  Result Date: 08/30/2021 CLINICAL DATA:  Left leg pain after fall. EXAM: LEFT FEMUR 2 VIEWS COMPARISON:  None. FINDINGS: Status post surgical internal fixation of old proximal left femoral fracture. No acute fracture or dislocation is noted. IMPRESSION: No acute abnormality seen in the left femur. Electronically Signed   By: Lupita Raider M.D.   On: 08/30/2021 16:26   DG Femur Min 2 Views Right  Result Date: 08/30/2021 CLINICAL DATA:  Right leg pain after fall. EXAM: RIGHT FEMUR 2 VIEWS COMPARISON:  None. FINDINGS: There is no  evidence of fracture or other focal bone lesions. Soft tissues are unremarkable. IMPRESSION: Negative. Electronically Signed   By: Lupita Raider M.D.   On: 08/30/2021 16:25    Labs on Admission: I have personally reviewed following labs  CBC: Recent Labs  Lab 08/30/21 1450  WBC 10.7*  HGB 15.7*  HCT 47.9*  MCV 103.0*  PLT 267   Basic Metabolic Panel: Recent Labs  Lab 08/30/21 1450  NA 135  K 3.6  CL 102  CO2 20*  GLUCOSE 117*  BUN 27*  CREATININE 0.97  CALCIUM 9.3   GFR: Estimated Creatinine Clearance: 38.8 mL/min (by C-G formula based on SCr of 0.97 mg/dL).  Liver Function Tests: Recent Labs  Lab 08/30/21 1448  AST 43*  ALT 19  ALKPHOS 94  BILITOT 1.1  PROT 8.4*  ALBUMIN 2.7*   Urine analysis:    Component Value Date/Time   COLORURINE AMBER (A) 08/30/2021 1823   APPEARANCEUR CLOUDY (A) 08/30/2021 1823   LABSPEC 1.028 08/30/2021 1823   PHURINE 5.0 08/30/2021 1823   GLUCOSEU NEGATIVE 08/30/2021 1823   HGBUR SMALL (A) 08/30/2021 1823   BILIRUBINUR NEGATIVE 08/30/2021 1823   KETONESUR  5 (A) 08/30/2021 1823   PROTEINUR 30 (A) 08/30/2021 1823   NITRITE POSITIVE (A) 08/30/2021 1823   LEUKOCYTESUR MODERATE (A) 08/30/2021 1823   Dr. Sedalia Muta [facility]  If 7PM-7AM, please contact overnight-coverage provider If 7AM-7PM, please contact day coverage provider [email or web address]  08/30/2021, 11:14 PM

## 2021-08-30 NOTE — ED Notes (Signed)
Per family pt takes eliquis and has fallen in the past week. Per family pt fell 4/1 and hurt her left hip but pt was not seen for any injuries and pt could walk after.  ?

## 2021-08-30 NOTE — ED Provider Notes (Signed)
? ?[facility] ?Provider Note ? ? ? Event Date/Time  ? First MD Initiated Contact with Patient 08/30/21 1535   ?  (approximate) ? ? ?History  ? ?Shortness of Breath ? ? ?HPI ? ?Angela Leonard is a 87 y.o. female with history of atrial fibrillation, recent COVID infection 1 month ago, hypertension presents to the emergency department with shortness of breath.  Patient's family states that her oxygen has been dropping on ambulation down below 90%, states it was 88% on room air and her heart rate also elevated.  Oxygen on room air at rest has been running around 93%.  The patient is denying chest pain.  They states she is a little more altered than normal.  Also states that her urine smells very strong.  Denies fever, chills.  Patient has also had multiple falls this week.  She is on a blood thinner. ? ?  ? ? ?Physical Exam  ? ?Triage Vital Signs: ?ED Triage Vitals  ?Enc Vitals Group  ?   BP 08/30/21 1440 118/67  ?   Pulse Rate 08/30/21 1440 (!) 102  ?   Resp 08/30/21 1440 20  ?   Temp 08/30/21 1440 (!) 97.5 ?F (36.4 ?C)  ?   Temp Source 08/30/21 1440 Oral  ?   SpO2 08/30/21 1440 92 %  ?   Weight 08/30/21 1441 132 lb (59.9 kg)  ?   Height 08/30/21 1441 5\' 5"  (1.651 m)  ?   Head Circumference --   ?   Peak Flow --   ?   Pain Score 08/30/21 1441 0  ?   Pain Loc --   ?   Pain Edu? --   ?   Excl. in [place]? --   ? ? ?Most recent vital signs: ?Vitals:  ? 08/30/21 1630 08/30/21 1632  ?BP: 121/66   ?Pulse: (!) 42 66  ?Resp: (!) 23 17  ?Temp:    ?SpO2: 94% 94%  ? ? ? ?General: Awake, no distress.   ?CV:  Good peripheral perfusion. regular rate and irregular rhythm, patient is in atrial fibrillation ?Resp:  Normal effort. Lungs CTA ?Abd:  No distention.   ?Other:    ? ? ?ED Results / Procedures / Treatments  ? ?Labs ?(all labs ordered are listed, but only abnormal results are displayed) ?Labs Reviewed  ?BASIC METABOLIC PANEL - Abnormal; Notable for the following components:  ?    Result Value  ? CO2 20 (*)   ?  Glucose, Bld 117 (*)   ? BUN 27 (*)   ? GFR, Estimated 58 (*)   ? All other components within normal limits  ?CBC - Abnormal; Notable for the following components:  ? WBC 10.7 (*)   ? Hemoglobin 15.7 (*)   ? HCT 47.9 (*)   ? MCV 103.0 (*)   ? All other components within normal limits  ?URINALYSIS, ROUTINE W REFLEX MICROSCOPIC - Abnormal; Notable for the following components:  ? Color, Urine AMBER (*)   ? APPearance CLOUDY (*)   ? Hgb urine dipstick SMALL (*)   ? Ketones, ur 5 (*)   ? Protein, ur 30 (*)   ? Nitrite POSITIVE (*)   ? Leukocytes,Ua MODERATE (*)   ? WBC, UA >50 (*)   ? Bacteria, UA FEW (*)   ? All other components within normal limits  ?HEPATIC FUNCTION PANEL - Abnormal; Notable for the following components:  ? Total Protein 8.4 (*)   ? Albumin 2.7 (*)   ?  AST 43 (*)   ? All other components within normal limits  ?LACTIC ACID, PLASMA  ?LACTIC ACID, PLASMA  ?BASIC METABOLIC PANEL  ?CBC  ?TSH  ?VITAMIN B12  ?VITAMIN B1  ?PROCALCITONIN  ?CBG MONITORING, ED  ?TROPONIN I (HIGH SENSITIVITY)  ?TROPONIN I (HIGH SENSITIVITY)  ? ? ? ?EKG ? ?EKG ? ? ?RADIOLOGY ?CT of the head C-spine ?Chest x-ray, x-ray of the pelvis, x-ray of the right and left femurs ? ? ? ?PROCEDURES: ? ? ?Procedures ? ? ?MEDICATIONS ORDERED IN ED: ?Medications  ?HYDROcodone-acetaminophen (NORCO) 7.5-325 MG per tablet 1 tablet (has no administration in time range)  ?mirtazapine (REMERON) tablet 15 mg (has no administration in time range)  ?traZODone (DESYREL) tablet 25 mg (has no administration in time range)  ?apixaban (ELIQUIS) tablet 2.5 mg (has no administration in time range)  ?methotrexate (RHEUMATREX) tablet 17.5 mg (has no administration in time range)  ?ferrous sulfate tablet 325 mg (has no administration in time range)  ?folic acid (FOLVITE) tablet 1 mg (has no administration in time range)  ?vitamin B-12 (CYANOCOBALAMIN) tablet 1,000 mcg (has no administration in time range)  ?cholecalciferol (VITAMIN D3) tablet 1,000 Units (has no  administration in time range)  ?multivitamin-lutein (OCUVITE-LUTEIN) capsule 1 capsule (has no administration in time range)  ?acetaminophen (TYLENOL) tablet 650 mg (has no administration in time range)  ?  Or  ?acetaminophen (TYLENOL) suppository 650 mg (has no administration in time range)  ?ondansetron (ZOFRAN) tablet 4 mg (has no administration in time range)  ?  Or  ?ondansetron (ZOFRAN) injection 4 mg (has no administration in time range)  ? ? ? ?IMPRESSION / MDM / ASSESSMENT AND PLAN / ED COURSE  ?I reviewed the triage vital signs and the nursing notes. ?             ?               ? ?Differential diagnosis includes, but is not limited to, subdural, SAH, CVA, PE, CAP, MI, NSTEMI, pelvic fracture, sepsis ? ?Labs and imaging ordered. ? ?At this time patient's labs are actually reassuring, her CBC has a minimal bump in the WBC of 56.3, basic metabolic panel continues to show decreased GFR which is in her normal trend, hepatic function panel has slight elevations but is in her normal trend, her troponin is normal, and her lactic acid is normal ? ?At this time I feel that a MI, NSTEMI, or sepsis is less likely than UTI or PE.  However since the patient is anticoagulated suspicion for PE would also be low.   ? ?Patient's oxygen dropped to 87% on room air while ambulating.  Placed patient on 1 L 02 nasal cannula. ? ?urinalysis has returned as infected.  Has nitrites, moderate leuks greater than 50 WBCs few bacteria ? ?Consult to hospitalist.  Dr. Tobie Poet is admitting patient.  Patient is agreeable to the admission.  Family is aware. ? ? ? ? ? ?  ? ? ?FINAL CLINICAL IMPRESSION(S) / ED DIAGNOSES  ? ?Final diagnoses:  ?Hypoxia  ?Acute UTI  ? ? ? ?Rx / DC Orders  ? ?ED Discharge Orders   ? ? None  ? ?  ? ? ? ?Note:  This document was prepared using Dragon voice recognition software and may include unintentional dictation errors. ? ?  ?Versie Starks, PA-C ?08/30/21 1913 ? ?  ?Nance Pear, MD ?08/30/21 2016 ? ?

## 2021-08-30 NOTE — Assessment & Plan Note (Addendum)
Resumed on home Cardizem CD1 80 mg daily.  Ramipril is on hold, per pharmacy this has not been filled recently.  Will resume ramipril if BP remains elevated. ?

## 2021-08-30 NOTE — Assessment & Plan Note (Addendum)
POA, resolved.  Sats stable on room air. ?Patient noted to have oxygen desaturation to 88% on room air at home, reporting couple months history of shortness of breath.  CTA chest was negative for PE.  Lower extremity Doppler ultrasound negative for DVT bilaterally.  ? ?CTA chest did show a focal mass in the left lower lobe up to 3.1 cm max diameter. ?-- 65-month follow-up CT recommended if high risk or if findings would change management ?-- Discussed with daughter, she indicated if this were felt to be malignancy they would not likely pursue aggressive treatment. ?

## 2021-08-30 NOTE — Hospital Course (Addendum)
[name] is an 87 year old female with history of  recent COVID infection 1 month ago, rheumatoid arthritis seropositive on methotrexate, insomnia, CKD stage IIIa, osteoarthritis, history squamous cell carcinoma of skin, hypertension, atrial fibrillation on Eliquis, hyperlipidemia, depression, who presented to the ED on 08/30/2021 with shortness of breath with oxygen desaturation to 88% at home, multiple falls at home in the past week. ? ?Initial vitals in the emergency department showed temperature of 97.5, respiration rate of 23, heart rate 92, blood pressure 110/79, SPO2 of 93% on room air. ? ?UA consistent with infection.  Started on empiric Rocephin pending urine culture results.  Urine culture returned with multiple species.  ? ?Hypoxia - resolved quickly, on room air and stable.  She reported dyspnea ongoing for months.  CTA chest ruled out PE, but showed emphysema, and a focal mass vs consolidation in the left lower lobe.  ? ?

## 2021-08-30 NOTE — ED Triage Notes (Signed)
Pt with O2 of 93% on room air but per family when pt does any activity, her sats drop to 88% on room air and her HR went up. Pt does not wear O2. Family noticed this yesterday as well. Pt had covid about a month ago.  ?

## 2021-08-30 NOTE — Assessment & Plan Note (Addendum)
Initial WBC 10.7, improved to 9.3. ?Suspected due to UTI. ?Monitor CBC ?

## 2021-08-30 NOTE — Assessment & Plan Note (Addendum)
Recent history of COVID-19 infection in March 2023.  Still testing positive this admission.  Asymptomatic other than the shortness of breath she mentioned on admission.   ?

## 2021-08-31 ENCOUNTER — Observation Stay: Payer: Medicare Other

## 2021-08-31 DIAGNOSIS — Z681 Body mass index (BMI) 19 or less, adult: Secondary | ICD-10-CM | POA: Diagnosis not present

## 2021-08-31 DIAGNOSIS — R296 Repeated falls: Secondary | ICD-10-CM | POA: Diagnosis present

## 2021-08-31 DIAGNOSIS — Z9049 Acquired absence of other specified parts of digestive tract: Secondary | ICD-10-CM | POA: Diagnosis not present

## 2021-08-31 DIAGNOSIS — Z8249 Family history of ischemic heart disease and other diseases of the circulatory system: Secondary | ICD-10-CM | POA: Diagnosis not present

## 2021-08-31 DIAGNOSIS — C4490 Unspecified malignant neoplasm of skin, unspecified: Secondary | ICD-10-CM | POA: Diagnosis present

## 2021-08-31 DIAGNOSIS — I1310 Hypertensive heart and chronic kidney disease without heart failure, with stage 1 through stage 4 chronic kidney disease, or unspecified chronic kidney disease: Secondary | ICD-10-CM | POA: Diagnosis present

## 2021-08-31 DIAGNOSIS — I482 Chronic atrial fibrillation, unspecified: Secondary | ICD-10-CM | POA: Diagnosis present

## 2021-08-31 DIAGNOSIS — Z79899 Other long term (current) drug therapy: Secondary | ICD-10-CM | POA: Diagnosis not present

## 2021-08-31 DIAGNOSIS — N1831 Chronic kidney disease, stage 3a: Secondary | ICD-10-CM | POA: Diagnosis present

## 2021-08-31 DIAGNOSIS — M069 Rheumatoid arthritis, unspecified: Secondary | ICD-10-CM | POA: Diagnosis present

## 2021-08-31 DIAGNOSIS — Z7901 Long term (current) use of anticoagulants: Secondary | ICD-10-CM | POA: Diagnosis not present

## 2021-08-31 DIAGNOSIS — M1611 Unilateral primary osteoarthritis, right hip: Secondary | ICD-10-CM | POA: Diagnosis present

## 2021-08-31 DIAGNOSIS — R0602 Shortness of breath: Secondary | ICD-10-CM | POA: Diagnosis present

## 2021-08-31 DIAGNOSIS — J439 Emphysema, unspecified: Secondary | ICD-10-CM | POA: Diagnosis present

## 2021-08-31 DIAGNOSIS — N390 Urinary tract infection, site not specified: Secondary | ICD-10-CM | POA: Diagnosis present

## 2021-08-31 DIAGNOSIS — U071 COVID-19: Secondary | ICD-10-CM | POA: Diagnosis present

## 2021-08-31 DIAGNOSIS — J9601 Acute respiratory failure with hypoxia: Secondary | ICD-10-CM | POA: Diagnosis present

## 2021-08-31 DIAGNOSIS — Z85828 Personal history of other malignant neoplasm of skin: Secondary | ICD-10-CM | POA: Diagnosis not present

## 2021-08-31 DIAGNOSIS — E43 Unspecified severe protein-calorie malnutrition: Secondary | ICD-10-CM | POA: Diagnosis present

## 2021-08-31 DIAGNOSIS — F0390 Unspecified dementia without behavioral disturbance: Secondary | ICD-10-CM | POA: Diagnosis present

## 2021-08-31 DIAGNOSIS — E785 Hyperlipidemia, unspecified: Secondary | ICD-10-CM | POA: Diagnosis present

## 2021-08-31 DIAGNOSIS — Z87891 Personal history of nicotine dependence: Secondary | ICD-10-CM | POA: Diagnosis not present

## 2021-08-31 LAB — BASIC METABOLIC PANEL
Anion gap: 10 (ref 5–15)
BUN: 22 mg/dL (ref 8–23)
CO2: 22 mmol/L (ref 22–32)
Calcium: 8.6 mg/dL — ABNORMAL LOW (ref 8.9–10.3)
Chloride: 107 mmol/L (ref 98–111)
Creatinine, Ser: 0.78 mg/dL (ref 0.44–1.00)
GFR, Estimated: >60 mL/min (ref >60)
Glucose, Bld: 77 mg/dL (ref 70–99)
Potassium: 3.2 mmol/L — ABNORMAL LOW (ref 3.5–5.1)
Sodium: 139 mmol/L (ref 135–145)

## 2021-08-31 LAB — CBC
HCT: 45.3 % (ref 36.0–46.0)
Hemoglobin: 15 g/dL (ref 12.0–15.0)
MCH: 34.3 pg — ABNORMAL HIGH (ref 26.0–34.0)
MCHC: 33.1 g/dL (ref 30.0–36.0)
MCV: 103.7 fL — ABNORMAL HIGH (ref 80.0–100.0)
Platelets: 241 10*3/uL (ref 150–400)
RBC: 4.37 MIL/uL (ref 3.87–5.11)
RDW: 14.2 % (ref 11.5–15.5)
WBC: 9.3 10*3/uL (ref 4.0–10.5)
nRBC: 0 % (ref 0.0–0.2)

## 2021-08-31 LAB — VITAMIN B12: Vitamin B-12: 1720 pg/mL — ABNORMAL HIGH (ref 180–914)

## 2021-08-31 MED ORDER — FLUOROURACIL 5 % EX CREA
TOPICAL_CREAM | Freq: Two times a day (BID) | CUTANEOUS | Status: DC
  Administered 2021-09-02: 1 via TOPICAL
  Filled 2021-08-31: qty 1

## 2021-08-31 MED ORDER — APIXABAN 5 MG PO TABS
5.0000 mg | ORAL_TABLET | Freq: Two times a day (BID) | ORAL | Status: DC
  Administered 2021-08-31 – 2021-09-09 (×18): 5 mg via ORAL
  Filled 2021-08-31 (×19): qty 1

## 2021-08-31 MED ORDER — ACETAMINOPHEN 650 MG RE SUPP: 650.0000 mg | Freq: Four times a day (QID) | RECTAL | Status: AC | PRN

## 2021-08-31 MED ORDER — ENSURE ENLIVE PO LIQD
237.0000 mL | Freq: Two times a day (BID) | ORAL | Status: DC
  Administered 2021-09-01 – 2021-09-04 (×4): 237 mL via ORAL

## 2021-08-31 MED ORDER — ACETAMINOPHEN 325 MG PO TABS: 650.0000 mg | ORAL_TABLET | Freq: Four times a day (QID) | ORAL | Status: AC | PRN

## 2021-08-31 MED ORDER — POTASSIUM CHLORIDE CRYS ER 20 MEQ PO TBCR
40.0000 meq | EXTENDED_RELEASE_TABLET | Freq: Once | ORAL | Status: AC
  Administered 2021-08-31: 40 meq via ORAL
  Filled 2021-08-31: qty 2

## 2021-08-31 NOTE — Progress Notes (Signed)
Initial Nutrition Assessment ? ?DOCUMENTATION CODES:  ? ?Not applicable ? ?INTERVENTION:  ? ?-Liberalize diet to regular ?-MVI with minerals daily ?-Ensure Enlive po BID, each supplement provides 350 kcal and 20 grams of protein.  ? ?NUTRITION DIAGNOSIS:  ? ?Increased nutrient needs related to acute illness as evidenced by estimated needs. ? ?GOAL:  ? ?Patient will meet greater than or equal to 90% of their needs ? ?MONITOR:  ? ?PO intake, Supplement acceptance, Labs, Weight trends, Skin, I & O's ? ?REASON FOR ASSESSMENT:  ? ?Malnutrition Screening Tool ?  ? ?ASSESSMENT:  ? ?[name] is an 86 year old female with history of rheumatoid arthritis seropositive on methotrexate, insomnia, CKD stage IIIa, osteoarthritis, hypertension, atrial fibrillation on Eliquis, hyperlipidemia, depression, who presents emergency department for chief concerns of shortness of breath. ? ?Pt admitted with acute respiratory failure with hypoxia.  ? ?Reviewed I/O's: +415 ml x 24 hours ?  ?Pt unavailable at time of visit. RD out of room and no family members/ caregivers present at time of visit. RD unable to obtain further nutrition-related history or complete nutrition-focused physical exam at this time.   ? ?Pt currently on a heart healthy diet. No meal completions available to assess at this time. RD will liberalize diet to increase variety of meal selections in attempt to optimize oral intake.  ? ?Reviewed wt hx; pt has experienced a 19.1% wt loss over the past 6 months, which is significant for time frame.  ? ?Pt at high risk for malnutrition, but unable to identify at this time. Pt would greatly benefit from addition of oral nutrition supplements.  ? ?Medications reviewed and include vitamin D3, ferrous sulfate, folic acid, remeron, vitamin B-12, and 0.9% sodium chloride infusion @ 125 ml/hr.  ? ?Labs reviewed: K: 3.2.  CBGS: 140. ? ?Diet Order:   ?Diet Order   ? ?       ?  Diet Heart Room service appropriate? Yes; Fluid  consistency: Thin  Diet effective now       ?  ? ?  ?  ? ?  ? ? ?EDUCATION NEEDS:  ? ?No education needs have been identified at this time ? ?Skin:  Skin Assessment: Reviewed RN Assessment ? ?Last BM:  08/30/21 ? ?Height:  ? ?Ht Readings from Last 1 Encounters:  ?08/30/21 5\' 5"  (1.651 m)  ? ? ?Weight:  ? ?Wt Readings from Last 1 Encounters:  ?08/30/21 59.9 kg  ? ? ?Ideal Body Weight:  56.8 kg ? ?BMI:  Body mass index is 21.97 kg/m?. ? ?Estimated Nutritional Needs:  ? ?Kcal:  1500-1700 ? ?Protein:  85-100 grams ? ?Fluid:  > 1.5 L ? ? ? ?Loistine Chance, RD, LDN, CDCES ?Registered Dietitian II ?Certified Diabetes Care and Education Specialist ?Please refer to [facility] for RD and/or RD on-call/weekend/after hours pager  ?

## 2021-08-31 NOTE — Assessment & Plan Note (Addendum)
On Rocephin, day 3.  ?Urine culture with multiple species. ?Will not recollect given already on antibiotics and clinically improving.  Complete 5-day course. ? ?Family reported change in mental status/confusion. Status post Foley placement in ED. ?

## 2021-08-31 NOTE — Progress Notes (Signed)
?Progress Note ? ? ?Patient: Angela Leonard MOQ:[id number] DOB: May 06, 1935 DOA: 08/30/2021     0 ?DOS: the patient was seen and examined on 08/31/2021 ?  ?Brief hospital course: ?[name] is an 87 year old female with history of rheumatoid arthritis seropositive on methotrexate, insomnia, CKD stage IIIa, osteoarthritis, history squamous cell carcinoma of skin, hypertension, atrial fibrillation on Eliquis, hyperlipidemia, depression, who presented to the ED on 08/30/2021 with shortness of breath with oxygen desaturation to 88% at home, multiple falls at home in the past week. ? ?Initial vitals in the emergency department showed temperature of 97.5, respiration rate of 23, heart rate 92, blood pressure 110/79, SPO2 of 93% on room air. ? ?UA consistent with infection.  Started on empiric Rocephin pending urine culture results.   ? ?Hypoxia - resolved quickly, on room air today (4/7).  ?She reported dyspnea ongoing for months.  CTA chest ruled out PE, but showed emphysema, and a focal mass vs consolidation in the left lower lobe.  ? ? ?Assessment and Plan: ?* Acute respiratory failure with hypoxia (HCC) ?- Etiology work-up in progress ?- Check procalcitonin, B1, B12, TSH ?- Continue to follow on second high-sensitivity troponin, second lactic acid ?- Given that patient is likely underdosing her Eliquis, check D-dimer ?- If D-dimer is positive we will order ultrasound of the lower extremity and a CTA to assess for pulmonary embolism ?- TOC consulted for DME/oxygen tank on discharge for use with exertion to maintain SPO2 greater than 92% ? ?Arthritis of right hip ?Analgesia PRN. ?Mobilize and OOB as tolerated. ? ?Acute lower UTI ?On Rocephin pending culture results.  ? ?Family reported change in mental status/confusion. Status post Foley placement in ED. ? ?Leukocytosis ?- Mild ?- Presumed secondary to UTI, treat as above ?- CBC in the a.m. ? ?Chronic atrial fibrillation, unspecified ([place]) ?- Daughter states that patient  is currently taking Eliquis 5 mg p.o. once daily ?- I have discussed with daughter, daughter states that previously patient was taking Eliquis 5 mg p.o. twice daily, however she recently changed doctors and was advised by the new doctor to take 5 mg p.o. once daily only ?- Per current recommendation, I have changed Eliquis 5 mg p.o. to twice daily ? ?Skin cancer ?- Status post removal at dermatology office and is currently on 5-FU cream daily ?- I have discussed with pharmacy, we do not have this medication on formulary ?- I have advised daughter that she will need to bring the cream to the hospital so that we can relabel the medication for patient use ? ?History of COVID-19 ?- History of COVID-19 infection in March 2023 ? ?RA (rheumatoid arthritis) ([place]) ?- Patient is on methotrexate 2.5 mg tablet every Tuesday ?- Continue follow-up with outpatient rheumatologist ? ?HLD (hyperlipidemia) ?- Currently not taking statin medication ? ?HTN (hypertension) ?- Outpatient medications: Diltiazem 180 milligram daily, ramipril 10 mg daily resumed ?- A.m. team to complete med reconciliation ? ? ? ? ?  ? ?Subjective: Pt awake eating breakfast when seen this AM.  Reports feeling okay and denies feeling sick, feverish or chilled.  Denies CP or SOB. ? ?Physical Exam: ?Vitals:  ? 08/30/21 2011 08/31/21 3546 08/31/21 1629 08/31/21 1950  ?BP: (!) 86/73 (!) 144/86 (!) 142/113 133/88  ?Pulse: 65 91 92 100  ?Resp: 15 16 16 17   ?Temp: 98.3 ?F (36.8 ?C) 97.9 ?F (36.6 ?C) (!) 97.4 ?F (36.3 ?C) 98.2 ?F (36.8 ?C)  ?TempSrc:      ?SpO2: 98% 95%  99% 98%  ?Weight:      ?Height:      ? ?General exam: awake, alert, no acute distress, frail ?HEENT:gauze and bandaid over anterior upper left scalp overlying site of skin cancer excision, moist mucus membranes, hearing grossly normal  ?Respiratory system: CTAB, no wheezes, rales or rhonchi, normal respiratory effort. ?Cardiovascular system: normal S1/S2,  RRR, no pedal edema.   ?Gastrointestinal  system: soft, NT ?Central nervous system: no gross focal neurologic deficits, normal speech ?Extremities: moves all , no edema, normal tone ?Skin: dry, intact, normal temperature ?Psychiatry: normal mood, congruent affect, judgement and insight appear normal ? ? ?Data Reviewed: ? ?Labs notable for  K 3.2, Ca 8.6, B12 elevated 1720, MCV 103.7, normal procal, Covid positive (infection last month) ? ?Family Communication: none at bedside, will attempt to call ? ?Disposition: ?Status is: Inpatient ?Remains inpatient appropriate because: on empiric antibiotics pending urine culture ? ? Planned Discharge Destination: Home ? ? ? ?Time spent: 35 minutes ? ?Author: ?Ezekiel Slocumb, DO ?08/31/2021 8:30 PM ? ?For on call review [email or web address].  ?

## 2021-08-31 NOTE — Evaluation (Addendum)
Occupational Therapy Evaluation ?Patient Details ?Name: Angela Leonard ?MRN: [id number] ?DOB: 07-Dec-1934 ?Today's Date: 08/31/2021 ? ? ?History of Present Illness Angela Leonard is a 87 y.o. female with history of atrial fibrillation, recent COVID infection 1 month ago, hypertension presents to the emergency department with shortness of breath and multiple falls this week.  ? ?Clinical Impression ?  ?Angela Leonard was seen for OT evaluation this date. Per chart, prior to hospital admission, pt was ambulatory with RW, endorses falls hx. Pt lives with daughter in home with ramped entrance per chart review. Pt presents to acute OT demonstrating impaired ADL performance and functional mobility 2/2 decreased activity tolerance, poor safety awareness, and functional strength/ROM/balance deficits. Oriented to self and location as hospital - when asked city/date pt states "why does it matter." Pt currently requires MAX A don B socks at bed level. MOD A exit R side of bed, upon sitting EOB pt became fearful of falling and develops heavy posterior lean - attempted to assist into upright posture and pt reports increased anxiousness stating "I will fall if I sit up." Unable to complete seated grooming tasks as pt requires BUE support static sitting.  ? ?Sit>sup at flat bed with increased time and rail use only - no physical assist. Expect pt mobility is limited by cognition this date. MIN A L+R rolling to replace wet chux. MAX A pericare at bed level. Pt would benefit from skilled OT to address noted impairments and functional limitations (see below for any additional details). Upon hospital discharge, recommend STR however if family able to provide physical assist and 24/7 care pt may improve in familiar environment and be safe for d/c home with hospital bed.   ? ?Recommendations for follow up therapy are one component of a multi-disciplinary discharge planning process, led by the attending physician.  Recommendations may be updated based  on patient status, additional functional criteria and insurance authorization.  ? ?Follow Up Recommendations ? Skilled nursing-short term rehab (<3 hours/day) (may return home with 24/7 and hospital bed)  ?  ?Assistance Recommended at Discharge Frequent or constant Supervision/Assistance  ?Patient can return home with the following A lot of help with walking and/or transfers;A lot of help with bathing/dressing/bathroom ? ?  ?Functional Status Assessment ? Patient has had a recent decline in their functional status and demonstrates the ability to make significant improvements in function in a reasonable and predictable amount of time.  ?Equipment Recommendations ? Hospital bed  ?  ?Recommendations for Other Services   ? ? ?  ?Precautions / Restrictions Precautions ?Precautions: Fall ?Restrictions ?Weight Bearing Restrictions: No  ? ?  ? ?Mobility Bed Mobility ?Overal bed mobility: Needs Assistance ?Bed Mobility: Supine to Sit, Sit to Supine, Rolling ?Rolling: Min assist ?  ?Supine to sit: Mod assist ?Sit to supine: Min guard ?  ?  ?  ? ?Transfers ?  ?  ?  ?  ?  ?  ?  ?  ?  ?General transfer comment: poor sitting tolerance and pt fearful of standing attempts ?  ? ?  ?Balance Overall balance assessment: Needs assistance ?Sitting-balance support: Bilateral upper extremity supported, Feet supported ?Sitting balance-[name] Scale: Poor ?Sitting balance - Comments: initial CGA decreasing to MOD A ?Postural control: Posterior lean ?  ?  ?  ?  ?  ?  ?  ?  ?  ?  ?  ?  ?  ?  ?   ? ?ADL either performed or assessed with clinical judgement  ? ?ADL Overall  ADL's : Needs assistance/impaired ?  ?  ?  ?  ?  ?  ?  ?  ?  ?  ?  ?  ?  ?  ?  ?  ?  ?  ?  ?General ADL Comments: MAX A don B socks at bed level. MAX A hair brushing seated EOB - pt requires BUE support static sitting. MAX A pericare at bed level  ? ? ? ? ?Pertinent Vitals/Pain Pain Assessment ?Pain Assessment: No/denies pain  ? ? ? ?Hand Dominance Right ?  ?Extremity/Trunk  Assessment Upper Extremity Assessment ?Upper Extremity Assessment: Generalized weakness ?  ?Lower Extremity Assessment ?Lower Extremity Assessment: Generalized weakness ?  ?  ?  ?Communication Communication ?Communication: HOH ?  ?Cognition Arousal/Alertness: Awake/alert ?Behavior During Therapy: Anxious ?Overall Cognitive Status: No family/caregiver present to determine baseline cognitive functioning ?  ?  ?  ?  ?  ?  ?  ?  ?  ?  ?  ?  ?  ?  ?  ?  ?General Comments: oriented to self and location as hospital, when asked city/date pt states "why does it matter." Upon sitting EOB pt became fearful of falling ?  ?  ? ?Home Living Family/patient expects to be discharged to:: Private residence ?Living Arrangements: Children ?Available Help at Discharge: Family ?Type of Home: House ?Home Access: Stairs to enter;Ramped entrance ?  ?  ?Home Layout: One level ?  ?  ?Bathroom Shower/Tub: Tub/shower unit;Walk-in shower ?  ?Bathroom Toilet: Standard ?  ?  ?  ?  ?Additional Comments: setup per chart as pt unrelieable historian ?  ? ?  ?Prior Functioning/Environment Prior Level of Function : Needs assist ?  ?  ?  ?  ?  ?  ?Mobility Comments: per chart pt ambulatory with RW however falls hx (pt reports ~3 recent falls). ?  ?  ? ?  ?  ?OT Problem List: Decreased strength;Decreased range of motion;Decreased activity tolerance;Impaired balance (sitting and/or standing);Decreased safety awareness ?  ?   ?OT Treatment/Interventions: Self-care/ADL training;Therapeutic exercise;Energy conservation;DME and/or AE instruction;Therapeutic activities;Patient/family education;Balance training  ?  ?OT Goals(Current goals can be found in the care plan section) Acute Rehab OT Goals ?Patient Stated Goal: to be left alone ?OT Goal Formulation: With patient ?Time For Goal Achievement: 09/14/21 ?Potential to Achieve Goals: Good ?ADL Goals ?Pt Will Perform Grooming: sitting;with min guard assist ?Pt Will Perform Lower Body Dressing: with min  assist;sitting/lateral leans ?Pt Will Transfer to Toilet: with min assist;bedside commode;stand pivot transfer  ?OT Frequency: Min 2X/week ?  ? ?Co-evaluation   ?  ?  ?  ?  ? ?  ?AM-PAC OT "6 Clicks" Daily Activity     ?Outcome Measure Help from another person eating meals?: A Little ?Help from another person taking care of personal grooming?: A Little ?Help from another person toileting, which includes using toliet, bedpan, or urinal?: A Lot ?Help from another person bathing (including washing, rinsing, drying)?: A Lot ?Help from another person to put on and taking off regular upper body clothing?: A Little ?Help from another person to put on and taking off regular lower body clothing?: A Lot ?6 Click Score: 15 ?  ?End of Session   ? ?Activity Tolerance: Patient limited by fatigue ?Patient left: in bed;with call Peter/phone within reach;with bed alarm set ? ?OT Visit Diagnosis: Other abnormalities of gait and mobility (R26.89);Muscle weakness (generalized) (M62.81)  ?              ?Time:  5868-2574 ?OT Time Calculation (min): 14 min ?Charges:  OT General Charges ?$OT Visit: 1 Visit ?OT Evaluation ?$OT Eval Low Complexity: 1 Low ? ?Dessie Coma, M.S. OTR/L  ?08/31/21, 3:20 PM  ?ascom [phone] ? ?

## 2021-08-31 NOTE — Plan of Care (Signed)
°  Problem: Coping: °Goal: Level of anxiety will decrease °Outcome: Progressing °  °

## 2021-08-31 NOTE — Progress Notes (Signed)
PT Cancellation Note ? ?Patient Details ?Name: Angela Leonard ?MRN: [id number] ?DOB: 09/17/1934 ? ? ?Cancelled Treatment:    Reason Eval/Treat Not Completed: Patient not medically ready Due to patient awaiting imaging for a DVT, will hold on PT at this time, and will re-attempt at a later time/date as available and patient medically appropriate for PT. Thank you! ? ?Iva Boop, PT  ?08/31/21. 10:33 AM ? ?

## 2021-08-31 NOTE — Assessment & Plan Note (Signed)
Analgesia PRN. ?Mobilize and OOB as tolerated. ?

## 2021-09-01 DIAGNOSIS — R531 Weakness: Secondary | ICD-10-CM

## 2021-09-01 DIAGNOSIS — R296 Repeated falls: Secondary | ICD-10-CM

## 2021-09-01 DIAGNOSIS — J9601 Acute respiratory failure with hypoxia: Secondary | ICD-10-CM | POA: Diagnosis not present

## 2021-09-01 LAB — BASIC METABOLIC PANEL
Anion gap: 7 (ref 5–15)
BUN: 17 mg/dL (ref 8–23)
CO2: 22 mmol/L (ref 22–32)
Calcium: 8.9 mg/dL (ref 8.9–10.3)
Chloride: 107 mmol/L (ref 98–111)
Creatinine, Ser: 0.7 mg/dL (ref 0.44–1.00)
GFR, Estimated: >60 mL/min (ref >60)
Glucose, Bld: 100 mg/dL — ABNORMAL HIGH (ref 70–99)
Potassium: 3.7 mmol/L (ref 3.5–5.1)
Sodium: 136 mmol/L (ref 135–145)

## 2021-09-01 LAB — MAGNESIUM: Magnesium: 1.5 mg/dL — ABNORMAL LOW (ref 1.7–2.4)

## 2021-09-01 LAB — GLUCOSE, CAPILLARY: Glucose-Capillary: 183 mg/dL — ABNORMAL HIGH (ref 70–99)

## 2021-09-01 LAB — URINE CULTURE

## 2021-09-01 MED ORDER — DILTIAZEM HCL ER COATED BEADS 180 MG PO CP24
180.0000 mg | ORAL_CAPSULE | Freq: Every day | ORAL | Status: DC
  Administered 2021-09-01 – 2021-09-09 (×8): 180 mg via ORAL
  Filled 2021-09-01 (×9): qty 1

## 2021-09-01 MED ORDER — MAGNESIUM SULFATE 2 GM/50ML IV SOLN
2.0000 g | Freq: Once | INTRAVENOUS | Status: AC
  Administered 2021-09-01: 2 g via INTRAVENOUS
  Filled 2021-09-01: qty 50

## 2021-09-01 NOTE — [place] FL2 (Signed)
?[place] MEDICAID FL2 LEVEL OF CARE SCREENING TOOL  ?  ? ?IDENTIFICATION  ?Patient Name: ?Angela Leonard Birthdate: 03/04/1935 Sex: female Admission Date (Current Location): ?08/30/2021  ?[place] and [place] Number: ? [place] ?  Facility and Address:  ?[facility], [address] [place], [place] [address] ?     Provider Number: ?[id number]  ?Attending Physician Name and Address:  ?Ezekiel Slocumb, DO ? Relative Name and Phone Number:  ?Ethington,Jennifer (Daughter)   [phone] [facility]) ?   ?Current Level of Care: ?Hospital Recommended Level of Care: ?[place] Prior Approval Number: ?[id number] A ? ?Date Approved/Denied: ?02/21/19 PASRR Number: ?[id number] A ? ?Discharge Plan: ?SNF ?  ? ?Current Diagnoses: ?Patient Active Problem List  ? Diagnosis Date Noted  ? Shortness of breath 08/30/2021  ? Acute respiratory failure with hypoxia ([place]) 08/30/2021  ? History of COVID-19 08/30/2021  ? Pyuria 08/30/2021  ? Skin cancer 08/30/2021  ? Leukocytosis 08/30/2021  ? Right hip pain 11/29/2020  ? Atrial fibrillation with RVR ([place])   ? Hypotension   ? Mass of skin of head   ? Dementia without behavioral disturbance ([place])   ? Closed hip fracture requiring operative repair, left, sequela 08/11/2020  ? Dehydration 08/25/2019  ? Acute lower UTI 08/24/2019  ? Fall at home, initial encounter 08/24/2019  ? Hypokalemia 08/24/2019  ? RA (rheumatoid arthritis) ([place]) 08/24/2019  ? Fall 08/24/2019  ? Facial laceration   ? Chronic atrial fibrillation, unspecified ([place]) 02/19/2019  ? Sepsis ([place]) 08/03/2018  ? Atypical pneumonia 08/03/2018  ? Arthritis of right hip 08/03/2018  ? HTN (hypertension) 08/03/2018  ? HLD (hyperlipidemia) 08/03/2018  ? AKI (acute kidney injury) ([place]) 09/15/2017  ? Symptomatic cholelithiasis 10/13/2014  ? ? ?Orientation RESPIRATION BLADDER Height & Weight   ?  ?Self, Place ? Normal External catheter Weight: 59.9 kg ?Height:  5\' 5"  (165.1 cm)  ?BEHAVIORAL SYMPTOMS/MOOD  NEUROLOGICAL BOWEL NUTRITION STATUS  ?    Incontinent, Continent Diet (WIth supplementals, see dietician note.)  ?AMBULATORY STATUS COMMUNICATION OF NEEDS Skin   ?Extensive Assist Verbally (Memory and Cognitive Impairment) Other (Comment), Skin abrasions (Upper Left Face with draining wound with dressing changes.) ?  ?  ?  ?    ?     ?     ? ? ?Personal Care Assistance Level of Assistance  ?Bathing, Feeding, Dressing Bathing Assistance: Maximum assistance ?Feeding assistance: Limited assistance ?Dressing Assistance: Maximum assistance ?Total Care Assistance: Maximum assistance  ? ?Functional Limitations Info  ?    ?  ?   ? ? ?SPECIAL CARE FACTORS FREQUENCY  ?PT (By licensed PT), OT (By licensed OT)   ?  ?PT Frequency: 5x/week ?OT Frequency: 5x/week ?  ?  ?  ?   ? ? ?Contractures Contractures Info: Not present  ? ? ?Additional Factors Info  ?Isolation Precautions, Allergies Code Status Info: Full Code ?Allergies Info: No Known Allergies, please verify in chart. ?  ?  ?Isolation Precautions Info: History of COVID-19 infection in March 2023 and was Covid + 08/30/21 admission testing. ?   ? ?Current Medications (09/01/2021):  This is the current hospital active medication list ?Current Facility-Administered Medications  ?Medication Dose Route Frequency Provider Last Rate Last Admin  ? acetaminophen (TYLENOL) tablet 650 mg  650 mg Oral Q6H PRN Oswald Hillock, RPH      ? Or  ? acetaminophen (TYLENOL) suppository 650 mg  650 mg Rectal Q6H PRN Oswald Hillock, RPH      ? apixaban Arne [place]) tablet  5 mg  5 mg Oral BID Oswald Hillock, RPH   5 mg at 09/01/21 7494  ? cefTRIAXone (ROCEPHIN) 1 g in sodium chloride 0.9 % 100 mL IVPB  1 g Intravenous Q24H Cox, Amy N, DO 200 mL/hr at 08/31/21 2235 1 g at 08/31/21 2235  ? cholecalciferol (VITAMIN D3) tablet 1,000 Units  1,000 Units Oral Daily Cox, Amy N, DO   1,000 Units at 09/01/21 4967  ? diltiazem (CARDIZEM CD) 24 hr capsule 180 mg  180 mg Oral Daily Nicole Kindred A, DO   180 mg at  09/01/21 5916  ? feeding supplement (ENSURE ENLIVE / ENSURE PLUS) liquid 237 mL  237 mL Oral BID BM Nicole Kindred A, DO   237 mL at 09/01/21 3846  ? ferrous sulfate tablet 325 mg  325 mg Oral Q breakfast Cox, Amy N, DO   325 mg at 09/01/21 6599  ? fluorouracil (EFUDEX) 5 % cream   Topical BID Oswald Hillock, [facility]   Given at 09/01/21 3570  ? folic acid (FOLVITE) tablet 1 mg  1 mg Oral Daily Cox, Amy N, DO   1 mg at 09/01/21 1779  ? hydrALAZINE (APRESOLINE) injection 5 mg  5 mg Intravenous Q6H PRN Cox, Amy N, DO      ? HYDROcodone-acetaminophen (NORCO) 7.5-325 MG per tablet 1 tablet  1 tablet Oral Q6H PRN Cox, Amy N, DO      ? [START ON 09/04/2021] methotrexate (RHEUMATREX) tablet 17.5 mg  17.5 mg Oral Q Tue Cox, Amy N, DO      ? multivitamin-lutein (OCUVITE-LUTEIN) capsule 1 capsule  1 capsule Oral BID Cox, Amy N, DO   1 capsule at 09/01/21 3903  ? ondansetron (ZOFRAN) tablet 4 mg  4 mg Oral Q6H PRN Cox, Amy N, DO      ? Or  ? ondansetron (ZOFRAN) injection 4 mg  4 mg Intravenous Q6H PRN Cox, Amy N, DO      ? traZODone (DESYREL) tablet 25 mg  25 mg Oral QHS PRN Cox, Amy N, DO   25 mg at 08/30/21 2154  ? ? ? ?Discharge Medications: ?Please see discharge summary for a list of discharge medications. ? ?Relevant Imaging Results: ? ?Relevant Lab Results: ? ? ?Additional Information ?SS #: [id number] ? ?Izola Price, RN ? ? ? ? ?
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
## 2021-09-01 NOTE — Evaluation (Signed)
Physical Therapy Evaluation ?Patient Details ?Name: Angela Leonard ?MRN: [id number] ?DOB: [birth year] ?Today's Date: 09/01/2021 ? ?History of Present Illness ? Angela Leonard is a 87 y.o. female with history of atrial fibrillation, recent COVID infection 1 month ago, hypertension presents to the emergency department with shortness of breath and multiple falls this week.  ? ?Clinical Impression ?  ? Physical Therapy Evaluation completed this date. Due to patient being a poor historian due to cognitive deficits, spoke with patient's daughter for home set up and PLOF. Patient lives in a 1 story home with her daughter, and has a ramped entrance to enter. Patient reported no pain throughout session, however is very fearful of falling due to 3 falls within the last 3 weeks per patient's daughter. Patient does ambulate with a 2 wheeled walker, however over the past couple weeks has not been ambulating as much due to fear. Patient currently required Mod A+2 for supine to sit bed mobility, and Max A to perform stand pivot transfer from EOB to recliner. Patient performed 2 sit to stands from EOB. On first attempt patient completed Mod A +2 with RW, however could only tolerate standing for ~30 seconds before needing to sit. Second attempt was completed at Max A to perform stand pivot transfer. Patient was left in recliner with all needs in reach. Patient would continue to benefit from skilled physical therapy in order to optimize patience safety, strength, and independence with ADLs. Recommend STR upon discharge from acute hospitalization.   ? ?Recommendations for follow up therapy are one component of a multi-disciplinary discharge planning process, led by the attending physician.  Recommendations may be updated based on patient status, additional functional criteria and insurance authorization. ? ?Follow Up Recommendations Skilled nursing-short term rehab (<3 hours/day) ? ?  ?Assistance Recommended at Discharge Frequent or constant  Supervision/Assistance  ?Patient can return home with the following ? A lot of help with walking and/or transfers;A lot of help with bathing/dressing/bathroom;Assist for transportation;Help with stairs or ramp for entrance;Direct supervision/assist for financial management;Direct supervision/assist for medications management ? ?  ?Equipment Recommendations Other (comment) (TBD to next level of care)  ?Recommendations for Other Services ?    ?  ?Functional Status Assessment Patient has had a recent decline in their functional status and demonstrates the ability to make significant improvements in function in a reasonable and predictable amount of time.  ? ?  ?Precautions / Restrictions Precautions ?Precautions: Fall ?Restrictions ?Weight Bearing Restrictions: No  ? ?  ? ?Mobility ? Bed Mobility ?Overal bed mobility: Needs Assistance ?Bed Mobility: Supine to Sit ?  ?  ?Supine to sit: Mod assist, +2 for safety/equipment ?  ?  ?  ?Patient Response: Anxious ? ?Transfers ?Overall transfer level: Needs assistance ?  ?Transfers: Sit to/from Stand, Bed to chair/wheelchair/BSC ?Sit to Stand: Mod assist, +2 physical assistance (x1 attempt with RW, X1 attempt Max A) ?Stand pivot transfers: Max assist ?  ?  ?  ?  ?General transfer comment: poor sitting tolerance and pt fearful of standing attempts ?  ? ?Ambulation/Gait ?Ambulation/Gait assistance:  (deferred patient unable at this time) ?  ?  ?  ?  ?  ?  ?  ? ?Stairs ?  ?  ?  ?  ?  ? ?Wheelchair Mobility ?  ? ?Modified Rankin (Stroke Patients Only) ?  ? ?  ? ?Balance Overall balance assessment: Needs assistance ?Sitting-balance support: Bilateral upper extremity supported, Feet supported ?Sitting balance-[name] Scale: Poor ?Sitting balance - Comments: initial CGA-Min A progressed  to SBA ?Postural control: Posterior lean ?  ?Standing balance-[name] Scale: Poor ?  ?  ?  ?  ?  ?  ?  ?  ?  ?  ?  ?  ?   ? ? ? ?Pertinent Vitals/Pain Pain Assessment ?Pain Assessment: No/denies pain   ? ? ?Home Living Family/patient expects to be discharged to:: Private residence ?Living Arrangements: Children ?Available Help at Discharge: Family ?Type of Home: House ?Home Access: Ramped entrance ?Entrance Stairs-Rails: None ?  ?  ?Home Layout: One level ?Home Equipment: Conservation officer, nature (2 wheels) ?Additional Comments: setup per family member as pt unrelieable historian  ?  ?Prior Function Prior Level of Function : Needs assist ?  ?  ?  ?  ?  ?  ?Mobility Comments: per chart pt ambulatory with RW however falls hx (pt reports ~3 recent falls). ?ADLs Comments: Patient has home aid that comes from 9am-2pm, and then someone else comes in the evening to assist with bathing, dressing, etc. ?  ? ? ?Hand Dominance  ? Dominant Hand: Right ? ?  ?Extremity/Trunk Assessment  ? Upper Extremity Assessment ?Upper Extremity Assessment: Generalized weakness ?  ? ?Lower Extremity Assessment ?Lower Extremity Assessment: Generalized weakness (at least 2/5 strength bilaterally, able to glide BLEs across the bed with cueing however unable to perform a SLR) ?  ? ?   ?Communication  ? Communication: HOH  ?Cognition Arousal/Alertness: Awake/alert ?Behavior During Therapy: Anxious ?Overall Cognitive Status: History of cognitive impairments - at baseline ?  ?  ?  ?  ?  ?  ?  ?  ?  ?  ?  ?  ?  ?  ?  ?  ?General Comments: oriented to self and location ?  ?  ? ?  ?General Comments   ? ?  ?Exercises Other Exercises ?Other Exercises: patient educated on role of PT in acute setting, fall risk, d/c recommendations  ? ?Assessment/Plan  ?  ?PT Assessment Patient needs continued PT services  ?PT Problem List Decreased strength;Decreased balance;Pain;Decreased activity tolerance;Decreased mobility;Decreased safety awareness;Decreased skin integrity;Decreased cognition;Decreased knowledge of use of DME ? ?   ?  ?PT Treatment Interventions DME instruction;Functional mobility training;Balance training;Gait training;Therapeutic activities;Therapeutic  exercise   ? ?PT Goals (Current goals can be found in the Care Plan section)  ?Acute Rehab PT Goals ?PT Goal Formulation: Patient unable to participate in goal setting ?Time For Goal Achievement: 09/15/21 ?Potential to Achieve Goals: Fair ? ?  ?Frequency Min 2X/week ?  ? ? ?Co-evaluation   ?  ?  ?  ?  ? ? ?  ?AM-PAC PT "6 Clicks" Mobility  ?Outcome Measure Help needed turning from your back to your side while in a flat bed without using bedrails?: A Lot ?Help needed moving from lying on your back to sitting on the side of a flat bed without using bedrails?: A Lot ?Help needed moving to and from a bed to a chair (including a wheelchair)?: A Lot ?Help needed standing up from a chair using your arms (e.g., wheelchair or bedside chair)?: A Lot ?Help needed to walk in hospital room?: Total ?Help needed climbing 3-5 steps with a railing? : Total ?6 Click Score: 10 ? ?  ?End of Session Equipment Utilized During Treatment: Gait belt ?Activity Tolerance: Patient tolerated treatment well;Other (comment) (limited by fear) ?Patient left: in chair;with chair alarm set;with call [place]/phone within reach ?Nurse Communication: Mobility status ?PT Visit Diagnosis: Unsteadiness on feet (R26.81);Muscle weakness (generalized) (M62.81);Difficulty in walking, not elsewhere classified (  R26.2) ?  ? ?Time: 1206-1218 ?PT Time Calculation (min) (ACUTE ONLY): 12 min ? ? ?Charges:   PT Evaluation ?$PT Eval Moderate Complexity: 1 Mod ?  ?  ?   ? ? ?Iva Boop, PT  ?09/01/21. 12:43 PM ? ? ?

## 2021-09-01 NOTE — Assessment & Plan Note (Addendum)
With baseline frailty, cognitive deficits, Covid infection last month.  See generalized weakness. ?Fall precautions. ?
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
## 2021-09-01 NOTE — Assessment & Plan Note (Signed)
Mg 1.5 -replacing with 2 g IV Mg- sulfate. ?Monitor mag replace as needed ?

## 2021-09-01 NOTE — Assessment & Plan Note (Signed)
Likely in part due to recent COVID infection, also has a mass seen on CT in her left lower lobe which may contribute. ?Improved. ?

## 2021-09-01 NOTE — Assessment & Plan Note (Signed)
Delirium precautions 

## 2021-09-01 NOTE — Assessment & Plan Note (Signed)
With multiple falls at home in the week prior to admission.   ?PT/OT recommend SNF, daughter agreeable.  TOC following for placement.  ?Fall precautions.  ?

## 2021-09-02 DIAGNOSIS — J9601 Acute respiratory failure with hypoxia: Secondary | ICD-10-CM | POA: Diagnosis not present

## 2021-09-02 LAB — BASIC METABOLIC PANEL
Anion gap: 7 (ref 5–15)
BUN: 16 mg/dL (ref 8–23)
CO2: 22 mmol/L (ref 22–32)
Calcium: 8.7 mg/dL — ABNORMAL LOW (ref 8.9–10.3)
Chloride: 106 mmol/L (ref 98–111)
Creatinine, Ser: 0.7 mg/dL (ref 0.44–1.00)
GFR, Estimated: >60 mL/min (ref >60)
Glucose, Bld: 107 mg/dL — ABNORMAL HIGH (ref 70–99)
Potassium: 3.6 mmol/L (ref 3.5–5.1)
Sodium: 135 mmol/L (ref 135–145)

## 2021-09-02 LAB — MAGNESIUM: Magnesium: 1.8 mg/dL (ref 1.7–2.4)
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
## 2021-09-02 NOTE — TOC Progression Note (Signed)
Transition of Care (TOC) - Progression Note  ? ? ?Patient Details  ?Name: Angela Leonard ?MRN: [id number] ?Date of Birth: [birth year] ? ?Transition of Care (TOC) CM/SW Contact  ?Alberteen Sam, LCSW ?Phone Number: ?09/02/2021, 10:04 AM ? ?Clinical Narrative:    ? ? ?Pending bed offers at this time, potential barriers in offers includes holiday weekend.  ?  ?  ? ?Expected Discharge Plan and Services ?  ?  ?  ?  ?  ?                ?  ?  ?  ?  ?  ?  ?  ?  ?  ?  ? ? ?Social Determinants of Health (SDOH) Interventions ?  ? ?Readmission Risk Interventions ? ?  08/13/2020  ?  1:17 PM  ?Readmission Risk Prevention Plan  ?Transportation Screening Complete  ?PCP or Specialist Appt within 3-5 Days Complete  ?[place] or Home Care Consult Complete  ?Social Work Consult for [place] Planning/Counseling Complete  ?Palliative Care Screening Not Applicable  ?Medication Review Press photographer) Complete  ? ? ?

## 2021-09-03 DIAGNOSIS — J9601 Acute respiratory failure with hypoxia: Secondary | ICD-10-CM | POA: Diagnosis not present

## 2021-09-03 LAB — VITAMIN B1: Vitamin B1 (Thiamine): 196.3 nmol/L (ref 66.5–200.0)

## 2021-09-03 NOTE — TOC Progression Note (Signed)
Transition of Care (TOC) - Progression Note  ? ? ?Patient Details  ?Name: Angela Leonard ?MRN: [id number] ?Date of Birth: 02/04/1935 ? ?Transition of Care (TOC) CM/SW Contact  ?Alberteen Sam, LCSW ?Phone Number: ?09/03/2021, 2:39 PM ? ?Clinical Narrative:    ? ?CSW provided bed offers of [facility] and [facility] to patient's daughter [name].  ? ?No preference per [name].  ? ?Both facilities are still requiring 10 day quarantine period from positive covid test, so they can accept patient on 4/16.  ? ?Per daughter [name] patient test positive 6 weeks ago however it was a home test and they have no documentation of this.  ? ?MD informed of above.  ? ? ?  ?  ? ?Expected Discharge Plan and Services ?  ?  ?  ?  ?  ?                ?  ?  ?  ?  ?  ?  ?  ?  ?  ?  ? ? ?Social Determinants of Health (SDOH) Interventions ?  ? ?Readmission Risk Interventions ? ?  08/13/2020  ?  1:17 PM  ?Readmission Risk Prevention Plan  ?Transportation Screening Complete  ?PCP or Specialist Appt within 3-5 Days Complete  ?[place] or Home Care Consult Complete  ?Social Work Consult for [place] Planning/Counseling Complete  ?Palliative Care Screening Not Applicable  ?Medication Review Press photographer) Complete  ? ? ?
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
## 2021-09-03 NOTE — Care Management Important Message (Signed)
Important Message ? ?Patient Details  ?Name: Angela Leonard ?MRN: [id number] ?Date of Birth: 11-23-1934 ? ? ?Medicare Important Message Given:  Yes ? ? ? ? ?Dannette Barbara ?09/03/2021, 12:08 PM ?

## 2021-09-03 NOTE — Plan of Care (Signed)

## 2021-09-03 NOTE — Progress Notes (Signed)
Pt daughter called and a PW has been placed on the account hindering her from being able to inquire about pt's status. She is the only contact for the pt outside of her caregiver Amada Jupiter and no one has a password nor have they wanted to have a password placed on the account. Daughter Angela Leonard will come by tomorrow 4/10 to show her ID and remove password from the account.  ?

## 2021-09-04 ENCOUNTER — Inpatient Hospital Stay: Payer: Medicare Other

## 2021-09-04 DIAGNOSIS — E43 Unspecified severe protein-calorie malnutrition: Secondary | ICD-10-CM | POA: Insufficient documentation

## 2021-09-04 DIAGNOSIS — J9601 Acute respiratory failure with hypoxia: Secondary | ICD-10-CM | POA: Diagnosis not present

## 2021-09-04 MED ORDER — ENSURE ENLIVE PO LIQD
237.0000 mL | Freq: Three times a day (TID) | ORAL | Status: DC
  Administered 2021-09-05 – 2021-09-09 (×10): 237 mL via ORAL

## 2021-09-04 MED ORDER — PROSOURCE PLUS PO LIQD
30.0000 mL | Freq: Two times a day (BID) | ORAL | Status: DC
  Administered 2021-09-05 – 2021-09-09 (×5): 30 mL via ORAL
  Filled 2021-09-04 (×12): qty 30

## 2021-09-04 NOTE — Progress Notes (Signed)
Nutrition Follow-up ? ?DOCUMENTATION CODES:  ? ?Severe malnutrition in context of chronic illness ? ?INTERVENTION:  ? ?-Ensure Enlive po to TID, each supplement provides 350 kcal and 20 grams of protein ?-30 ml Prosource Plus BID, each supplement provides 100 kals and 15 grams protein ?-MVI with minerals daily ? ?NUTRITION DIAGNOSIS:  ? ?Severe Malnutrition related to chronic illness (rheumatoid arthritis) as evidenced by moderate fat depletion, severe fat depletion, moderate muscle depletion, severe muscle depletion. ? ?Ongoing ? ?GOAL:  ? ?Patient will meet greater than or equal to 90% of their needs ? ?Progressing  ? ?MONITOR:  ? ?PO intake, Supplement acceptance, Labs, Weight trends, Skin, I & O's ? ?REASON FOR ASSESSMENT:  ? ?Malnutrition Screening Tool ?  ? ?ASSESSMENT:  ? ?[name] is an 87 year old female with history of rheumatoid arthritis seropositive on methotrexate, insomnia, CKD stage IIIa, osteoarthritis, hypertension, atrial fibrillation on Eliquis, hyperlipidemia, depression, who presents emergency department for chief concerns of shortness of breath. ? ?Reviewed I/O's: +347 ml x 24 hours and -1.1 L since admission ? ?UOP: 350 ml x 24 hours ? ?Pt sleeping at time of visit. She aroused briefly when RD called her name, but unable to provide history. Case discussed with nurse tech, pt has not eaten today due to lethargy. Noted meal completions 0-50%.  ? ?Per TOC notes, plan to d/c to SNF once medically stable.  ?  ?Reviewed wt hx; pt has experienced a 19.1% wt loss over the past 6 months, which is significant for time frame.  ? ?Medications reviewed and include vitamin D3, ferrous sulfate, folic acid, and methotrexate.  ? ?Labs reviewed: CBGS: 183. ? ?NUTRITION - FOCUSED PHYSICAL EXAM: ? ?Flowsheet Row Most Recent Value  ?Orbital Region Moderate depletion  ?Upper Arm Region Severe depletion  ?Thoracic and Lumbar Region Moderate depletion  ?Buccal Region Severe depletion  ?Temple Region Severe  depletion  ?Clavicle Bone Region Severe depletion  ?Clavicle and Acromion Bone Region Severe depletion  ?Scapular Bone Region Severe depletion  ?Dorsal Hand Moderate depletion  ?Patellar Region Mild depletion  ?Anterior Thigh Region Mild depletion  ?Posterior Calf Region Mild depletion  ?Edema (RD Assessment) Mild  ?Hair Reviewed  ?Eyes Reviewed  ?Mouth Reviewed  ?Skin Reviewed  ?Nails Reviewed  ? ?  ? ? ?Diet Order:   ?Diet Order   ? ?       ?  Diet regular Room service appropriate? Yes; Fluid consistency: Thin  Diet effective now       ?  ? ?  ?  ? ?  ? ? ?EDUCATION NEEDS:  ? ?No education needs have been identified at this time ? ?Skin:  Skin Assessment: Skin Integrity Issues: ?Skin Integrity Issues:: Other (Comment) ?Other: wound to upper face ? ?Last BM:  09/03/21 ? ?Height:  ? ?Ht Readings from Last 1 Encounters:  ?08/30/21 5\' 5"  (1.651 m)  ? ? ?Weight:  ? ?Wt Readings from Last 1 Encounters:  ?08/30/21 59.9 kg  ? ? ?Ideal Body Weight:  56.8 kg ? ?BMI:  Body mass index is 21.97 kg/m?. ? ?Estimated Nutritional Needs:  ? ?Kcal:  1500-1700 ? ?Protein:  85-100 grams ? ?Fluid:  > 1.5 L ? ? ? ?Loistine Chance, RD, LDN, CDCES ?Registered Dietitian II ?Certified Diabetes Care and Education Specialist ?Please refer to [facility] for RD and/or RD on-call/weekend/after hours pager  ?

## 2021-09-04 NOTE — Progress Notes (Signed)
Occupational Therapy Treatment ?Patient Details ?Name: Angela Leonard ?MRN: [id number] ?DOB: August 29, 1934 ?Today's Date: 09/04/2021 ? ? ?History of present illness Angela Leonard is a 87 y.o. female with history of atrial fibrillation, recent COVID infection 1 month ago, hypertension presents to the emergency department with shortness of breath and multiple falls this week. ?  ?OT comments ? Angela Leonard was seen for OT treatment on this date. Upon arrival to room pt reclined in bed, family at bed side requesting to encourage mobility. Noted to have rolled compression sock - requires MAX A doff at bed level. SETUP self-drinking at bed level. Pt assisted into sitting with MOD A, tolerates ~2 min with fair balance however increased agitation - swats therapist away and returns to bed with SUPERVISION. Progress limited by cognition.Will continue to follow POC. Discharge recommendation remains appropriate.   ? ?Recommendations for follow up therapy are one component of a multi-disciplinary discharge planning process, led by the attending physician.  Recommendations may be updated based on patient status, additional functional criteria and insurance authorization. ?   ?Follow Up Recommendations ? Skilled nursing-short term rehab (<3 hours/day)  ?  ?Assistance Recommended at Discharge Frequent or constant Supervision/Assistance  ?Patient can return home with the following ? A lot of help with walking and/or transfers;A lot of help with bathing/dressing/bathroom ?  ?Equipment Recommendations ? Hospital bed  ?  ?Recommendations for Other Services   ? ?  ?Precautions / Restrictions Precautions ?Precautions: Fall ?Restrictions ?Weight Bearing Restrictions: No  ? ? ?  ? ?Mobility Bed Mobility ?Overal bed mobility: Needs Assistance ?Bed Mobility: Supine to Sit, Sit to Supine ?  ?  ?Supine to sit: Mod assist ?Sit to supine: Supervision ?  ?  ?  ? ?Transfers ?  ?  ?  ?  ?  ?  ?  ?  ?  ?  ?  ?  ?Balance Overall balance assessment: Needs  assistance ?Sitting-balance support: Feet supported, Single extremity supported ?Sitting balance-[name] Scale: Fair ?  ?  ?  ?  ?  ?  ?  ?  ?  ?  ?  ?  ?  ?  ?  ?  ?   ? ?ADL either performed or assessed with clinical judgement  ? ?ADL Overall ADL's : Needs assistance/impaired ?  ?  ?  ?  ?  ?  ?  ?  ?  ?  ?  ?  ?  ?  ?  ?  ?  ?  ?  ?General ADL Comments: MAX A doff B socks at bed level. SETUP self-drinking at bed level ?  ? ? ? ?Cognition Arousal/Alertness: Awake/alert ?Behavior During Therapy: Anxious ?Overall Cognitive Status: History of cognitive impairments - at baseline ?  ?  ?  ?  ?  ?  ?  ?  ?  ?  ?  ?  ?  ?  ?  ?  ?General Comments: oriented to self only ?  ?  ?   ?   ?   ?   ? ? ?Pertinent Vitals/ Pain       Pain Assessment ?Pain Assessment: Faces ?Faces Pain Scale: Hurts even more ?Pain Location: B ankles ?Pain Descriptors / Indicators: Dull, Discomfort, Grimacing, Crying ?Pain Intervention(s): Limited activity within patient's tolerance, Repositioned ? ? ?Frequency ? Min 2X/week  ? ? ? ? ?  ?Progress Toward Goals ? ?OT Goals(current goals can now be found in the care plan section) ? Progress towards OT goals: Progressing toward  goals ? ?Acute Rehab OT Goals ?Patient Stated Goal: to go home ?OT Goal Formulation: With patient ?Time For Goal Achievement: 09/14/21 ?Potential to Achieve Goals: Good ?ADL Goals ?Pt Will Perform Grooming: sitting;with min guard assist ?Pt Will Perform Lower Body Dressing: with min assist;sitting/lateral leans ?Pt Will Transfer to Toilet: with min assist;bedside commode;stand pivot transfer  ?Plan Discharge plan remains appropriate;Frequency remains appropriate   ? ?Co-evaluation ? ? ?   ?  ?  ?  ?  ? ?  ?AM-PAC OT "6 Clicks" Daily Activity     ?Outcome Measure ? ? Help from another person eating meals?: A Little ?Help from another person taking care of personal grooming?: A Little ?Help from another person toileting, which includes using toliet, bedpan, or urinal?: A Lot ?Help  from another person bathing (including washing, rinsing, drying)?: A Lot ?Help from another person to put on and taking off regular upper body clothing?: A Little ?Help from another person to put on and taking off regular lower body clothing?: A Lot ?6 Click Score: 15 ? ?  ?End of Session   ? ?OT Visit Diagnosis: Other abnormalities of gait and mobility (R26.89);Muscle weakness (generalized) (M62.81) ?  ?Activity Tolerance Treatment limited secondary to agitation ?  ?Patient Left in bed;with call [place]/phone within reach;with bed alarm set;with family/visitor present ?  ?Nurse Communication Mobility status ?  ? ?   ? ?Time: 0375-4360 ?OT Time Calculation (min): 24 min ? ?Charges: OT General Charges ?$OT Visit: 1 Visit ?OT Treatments ?$Self Care/Home Management : 23-37 mins ? ?Dessie Coma, M.S. OTR/L  ?09/04/21, 4:37 PM  ?ascom [phone] ? ?

## 2021-09-04 NOTE — Progress Notes (Signed)
?Progress Note ? ? ?Patient: Angela Leonard XIP:[id number] DOB: 1934-06-27 DOA: 08/30/2021     4 ?DOS: the patient was seen and examined on 09/04/2021 ?  ?Brief hospital course: ?[name] is an 87 year old female with history of  recent COVID infection 1 month ago, rheumatoid arthritis seropositive on methotrexate, insomnia, CKD stage IIIa, osteoarthritis, history squamous cell carcinoma of skin, hypertension, atrial fibrillation on Eliquis, hyperlipidemia, depression, who presented to the ED on 08/30/2021 with shortness of breath with oxygen desaturation to 88% at home, multiple falls at home in the past week. ? ?Initial vitals in the emergency department showed temperature of 97.5, respiration rate of 23, heart rate 92, blood pressure 110/79, SPO2 of 93% on room air. ? ?UA consistent with infection.  Started on empiric Rocephin pending urine culture results.  Urine culture returned with multiple species.  ? ?Hypoxia - resolved quickly, on room air and stable.  She reported dyspnea ongoing for months.  CTA chest ruled out PE, but showed emphysema, and a focal mass vs consolidation in the left lower lobe.  ? ? ?Assessment and Plan: ?* Acute respiratory failure with hypoxia (HCC) ?POA, resolved.  Sats stable on room air. ?Patient noted to have oxygen desaturation to 88% on room air at home, reporting couple months history of shortness of breath.  CTA chest was negative for PE.  Lower extremity Doppler ultrasound negative for DVT bilaterally.  ? ?CTA chest did show a focal mass in the left lower lobe up to 3.1 cm max diameter. ?-- 49-month follow-up CT recommended if high risk or if findings would change management ?-- Discussed with daughter, she indicated if this were felt to be malignancy they would not likely pursue aggressive treatment. ? ?Arthritis of right hip ?Analgesia PRN. ?Mobilize and OOB as tolerated. ? ?Acute lower UTI ?On Rocephin, day 3.  ?Urine culture with multiple species. ?Will not recollect given  already on antibiotics and clinically improving.  Complete 5-day course. ? ?Family reported change in mental status/confusion. Status post Foley placement in ED. ? ?Leukocytosis ?Initial WBC 10.7, improved to 9.3. ?Suspected due to UTI. ?Monitor CBC ? ?Chronic atrial fibrillation, unspecified ([place]) ?Per H&P: "Daughter states that patient is currently taking Eliquis 5 mg p.o. once daily ?- I have discussed with daughter, daughter states that previously patient was taking Eliquis 5 mg p.o. twice daily, however she recently changed doctors and was advised by the new doctor to take 5 mg p.o. once daily only ?- Per current recommendation, I have changed Eliquis 5 mg p.o. to twice daily" ? ? ?Hypomagnesemia ?Mg 1.5 -replacing with 2 g IV Mg- sulfate. ?Monitor mag replace as needed ? ?Generalized weakness ?With multiple falls at home in the week prior to admission.   ?PT/OT recommend SNF, daughter agreeable.  TOC following for placement.  ?Fall precautions.  ? ?Recurrent falls ?With baseline frailty, cognitive deficits, Covid infection last month.  See generalized weakness. ?Fall precautions. ? ?Skin cancer ?Status post excision at dermatology office and is currently on 5-FU cream daily -resumed ? ?History of COVID-19 ?Recent history of COVID-19 infection in March 2023.  Still testing positive this admission.  Asymptomatic other than the shortness of breath she mentioned on admission.   ? ?Shortness of breath ?Likely in part due to recent COVID infection, also has a mass seen on CT in her left lower lobe which may contribute. ?Improved. ? ?Dementia without behavioral disturbance ([place]) ?Delirium precautions ? ?RA (rheumatoid arthritis) ([place]) ?Continue home methotrexate 2.5 mg tablet  every Tuesday ?Rollow-up with outpatient rheumatologist ? ?HLD (hyperlipidemia) ?Not on statin ? ?HTN (hypertension) ?Resumed on home Cardizem CD1 80 mg daily.  Ramipril is on hold, per pharmacy this has not been filled recently.  Will resume  ramipril if BP remains elevated. ? ? ? ? ?  ? ?Subjective: Patient was sleeping when seen today, daughter at bedside.  No acute events reported,  Pt wakes but does not open eyes, says she is tired.  Denies pain or other acute complaints.  ? ?Physical Exam: ?Vitals:  ? 09/04/21 0442 09/04/21 0839 09/04/21 1236 09/04/21 1621  ?BP: 130/78 121/83 130/67 (!) 113/56  ?Pulse: 79 76 (!) 55 80  ?Resp: 17 16 16 16   ?Temp: 98.2 ?F (36.8 ?C) 97.6 ?F (36.4 ?C) 97.6 ?F (36.4 ?C) 98.4 ?F (36.9 ?C)  ?TempSrc:      ?SpO2: 92% 96% 95% 97%  ?Weight:      ?Height:      ? ?General exam: Sleeping comfortably but responds, keeps eyes closed, no acute distress ?Respiratory system: CTAB, no wheezes, rales or rhonchi, normal respiratory effort. ?Cardiovascular system: normal S1/S2, RRR, trace bilateral lower extremity edema.   ?Central nervous system: Exam limited by somnolence. no gross focal neurologic deficits ?Extremities: moves all, no edema, normal tone ?Skin: dry, intact, normal temperature ? ? ? ?Data Reviewed: ?No new labs for today.  ? ?Family Communication: Daughter Angela Leonard at bedside this AM on rounds. ? ?Disposition: ?Status is: Inpatient ?Remains inpatient appropriate because: delayed d/c to SNF until 4/16 due to Covid+ on admission. ? ?Pt had Covid last month, but home test with no documentation of initial positive test date. ? ? ? Planned Discharge Destination: Skilled nursing facility ? ? ? ?Time spent: 25 minutes  ? ?Author: ?Ezekiel Slocumb, DO ?09/04/2021 5:00 PM ? ?For on call review [email or web address].  ?

## 2021-09-04 NOTE — Progress Notes (Signed)
Physical Therapy Treatment ?Patient Details ?Name: Angela Leonard ?MRN: [id number] ?DOB: 1934/10/04 ?Today's Date: 09/04/2021 ? ? ?History of Present Illness Angela Leonard is a 87 y.o. female with history of atrial fibrillation, recent COVID infection 1 month ago, hypertension presents to the emergency department with shortness of breath and multiple falls this week. ? ?  ?PT Comments  ? ? Physical Therapy Treatment completed this date. Patient tolerated session well and was agreeable to treatment. Upon arrival into room patient was supine in bed with family present. No pain reported throughout session. Patient required Max A to complete supine to sit transfer. Once sitting EOB patient became resistant to additional EOB/OOB activity, and completed sit to supine at supervision. Patient required continuous max encouragement from [place] and daughter to continue with therapy.  Patient eventually agreed to 3 bed level exercises, "and then ill be done". Patient completed SLR and heel slides bilaterally with minimal assistance for increased ROM, and completed multiple ankles pumps. Patient was left in bed with all needs met and family present. Patient would continue to benefit from skilled physical therapy in order to optimize patient's safety, independence, and strength, as well as decrease caregiver burden at home. Continue to recommend STR upon discharge from acute hospitalization.  ?  ?Recommendations for follow up therapy are one component of a multi-disciplinary discharge planning process, led by the attending physician.  Recommendations may be updated based on patient status, additional functional criteria and insurance authorization. ? ?Follow Up Recommendations ? Skilled nursing-short term rehab (<3 hours/day) ?  ?  ?Assistance Recommended at Discharge Frequent or constant Supervision/Assistance  ?Patient can return home with the following A lot of help with walking and/or transfers;A lot of help with  bathing/dressing/bathroom;Assist for transportation;Help with stairs or ramp for entrance;Direct supervision/assist for financial management;Direct supervision/assist for medications management ?  ?Equipment Recommendations ? Other (comment) (TBD at next level of care)  ?  ?Recommendations for Other Services   ? ? ?  ?Precautions / Restrictions Precautions ?Precautions: Fall ?Restrictions ?Weight Bearing Restrictions: No  ?  ? ?Mobility ? Bed Mobility ?Overal bed mobility: Needs Assistance ?Bed Mobility: Supine to Sit ?  ?  ?Supine to sit: Max assist ?Sit to supine: Supervision ?  ?General bed mobility comments: Patient became resistant to sitting EOB and completed sit to supine at supervision ?  ? ?Transfers ?  ?  ?  ?  ?  ?  ?  ?  ?  ?  ?  ? ?Ambulation/Gait ?  ?  ?  ?  ?  ?  ?  ?  ? ? ?Stairs ?  ?  ?  ?  ?  ? ? ?Wheelchair Mobility ?  ? ?Modified Rankin (Stroke Patients Only) ?  ? ? ?  ?Balance Overall balance assessment: Needs assistance ?Sitting-balance support: Bilateral upper extremity supported, Feet supported ?Sitting balance-[name] Scale: Poor ?  ?  ?  ?  ?  ?  ?  ?  ?  ?  ?  ?  ?  ?  ?  ?  ?  ? ?  ?Cognition Arousal/Alertness: Awake/alert ?Behavior During Therapy: Anxious (mildly agitated) ?Overall Cognitive Status: History of cognitive impairments - at baseline ?  ?  ?  ?  ?  ?  ?  ?  ?  ?  ?  ?  ?  ?  ?  ?  ?General Comments: oriented to self and location ?  ?  ? ?  ?Exercises General Exercises - Lower Extremity ?Ankle  Circles/Pumps: Supine, 20 reps, AROM, Both ?Heel Slides: Supine, AAROM, 10 reps, Both ?Straight Leg Raises: Supine, AAROM, 10 reps, Both ? ?  ?General Comments   ?  ?  ? ?Pertinent Vitals/Pain Pain Assessment ?Pain Assessment: No/denies pain ?Pain Intervention(s): Monitored during session  ? ? ?Home Living   ?  ?  ?  ?  ?  ?  ?  ?  ?  ?   ?  ?Prior Function    ?  ?  ?   ? ?PT Goals (current goals can now be found in the care plan section) Acute Rehab PT Goals ?PT Goal Formulation: Patient  unable to participate in goal setting ?Time For Goal Achievement: 09/15/21 ?Potential to Achieve Goals: Fair ?Progress towards PT goals: Progressing toward goals ? ?  ?Frequency ? ? ? Min 2X/week ? ? ? ?  ?PT Plan Current plan remains appropriate  ? ? ?Co-evaluation   ?  ?  ?  ?  ? ?  ?AM-PAC PT "6 Clicks" Mobility   ?Outcome Measure ? Help needed turning from your back to your side while in a flat bed without using bedrails?: A Lot ?Help needed moving from lying on your back to sitting on the side of a flat bed without using bedrails?: A Lot ?Help needed moving to and from a bed to a chair (including a wheelchair)?: A Lot ?Help needed standing up from a chair using your arms (e.g., wheelchair or bedside chair)?: A Lot ?Help needed to walk in hospital room?: Total ?Help needed climbing 3-5 steps with a railing? : Total ?6 Click Score: 10 ? ?  ?End of Session Equipment Utilized During Treatment: Gait belt ?Activity Tolerance: Patient tolerated treatment well ?Patient left: in bed;with call [place]/phone within reach;with bed alarm set;with family/visitor present ?Nurse Communication: Mobility status ?PT Visit Diagnosis: Unsteadiness on feet (R26.81);Muscle weakness (generalized) (M62.81);Difficulty in walking, not elsewhere classified (R26.2) ?  ? ? ?Time: 7530-0511 ?PT Time Calculation (min) (ACUTE ONLY): 18 min ? ?Charges:  $Therapeutic Exercise: 8-22 mins          ?          ? ?Iva Boop, PT  ?09/04/21. 1:50 PM ? ? ?

## 2021-09-05 DIAGNOSIS — J9601 Acute respiratory failure with hypoxia: Secondary | ICD-10-CM | POA: Diagnosis not present

## 2021-09-05 LAB — CBC
HCT: 41.8 % (ref 36.0–46.0)
Hemoglobin: 13.9 g/dL (ref 12.0–15.0)
MCH: 34.4 pg — ABNORMAL HIGH (ref 26.0–34.0)
MCHC: 33.3 g/dL (ref 30.0–36.0)
MCV: 103.5 fL — ABNORMAL HIGH (ref 80.0–100.0)
Platelets: 241 10*3/uL (ref 150–400)
RBC: 4.04 MIL/uL (ref 3.87–5.11)
RDW: 13.5 % (ref 11.5–15.5)
WBC: 10.8 10*3/uL — ABNORMAL HIGH (ref 4.0–10.5)
nRBC: 0 % (ref 0.0–0.2)

## 2021-09-05 NOTE — Progress Notes (Signed)
?PROGRESS NOTE ? ?Angela Leonard RWE:[id number] DOB: Jul 21, 1934 DOA: 08/30/2021 ?PCP: [facility] [place] ? ? LOS: 5 days  ? ?Brief Narrative / Interim history: ?Angela Leonard is an 87 year old female with history of  recent COVID infection 1 month ago, rheumatoid arthritis seropositive on methotrexate, insomnia, CKD stage IIIa, osteoarthritis, history squamous cell carcinoma of skin, hypertension, atrial fibrillation on Eliquis, hyperlipidemia, depression, who presented to the ED on 08/30/2021 with shortness of breath with oxygen desaturation to 88% at home, multiple falls at home in the past week. UA consistent with infection.  Started on empiric Rocephin pending urine culture results.  Urine culture returned with multiple species. Hypoxia - resolved quickly, on room air and stable.  She reported dyspnea ongoing for months.  CTA chest ruled out PE, but showed emphysema, and a focal mass vs consolidation in the left lower lobe.  ? ?Subjective / 24h Interval events: ?Keeps her eyes closed, no complaints. ? ?Assesement and Plan: ?Principal Problem: ?  Acute respiratory failure with hypoxia ([place]) ?Active Problems: ?  Arthritis of right hip ?  Acute lower UTI ?  Leukocytosis ?  Chronic atrial fibrillation, unspecified ([place]) ?  HTN (hypertension) ?  HLD (hyperlipidemia) ?  RA (rheumatoid arthritis) ([place]) ?  Dementia without behavioral disturbance ([place]) ?  Shortness of breath ?  History of COVID-19 ?  Skin cancer ?  Recurrent falls ?  Generalized weakness ?  Hypomagnesemia ?  Protein-calorie malnutrition, severe ? ? ?Assessment and Plan: ?Principal problem ?Acute respiratory failure with hypoxia (HCC) -POA, resolved.  Sats stable on room air. ?Patient noted to have oxygen desaturation to 88% on room air at home, reporting couple months history of shortness of breath.  CTA chest was negative for PE.  Lower extremity Doppler ultrasound negative for DVT bilaterally. CTA chest did show a focal mass in the left lower lobe  up to 3.1 cm max diameter. 49-month follow-up CT recommended if high risk or if findings would change management. Discussed with daughter, she indicated if this were felt to be malignancy they would not likely pursue aggressive treatment. ? ?Arthritis of right hip -Analgesia PRN. ?Acute lower UTI -completed Rocephin ?Leukocytosis -Initial WBC 10.7, improved to 9.3. Suspected due to UTI. ?Chronic atrial fibrillation, unspecified ([place]) -continue Eliquis ?Hypomagnesemia -monitor and replete ?Generalized weakness -With multiple falls at home in the week prior to admission.   ?PT/OT recommend SNF, daughter agreeable.   ?Recurrent falls -With baseline frailty, cognitive deficits, Covid infection last month.  See generalized weakness. ?Skin cancer -Status post excision at dermatology office and is currently on 5-FU cream daily -resumed ?History of COVID-19-Recent history of COVID-19 infection in March 2023.  Still testing positive this admission.  Asymptomatic other than the shortness of breath she mentioned on admission.   ?Shortness of breath -Likely in part due to recent COVID infection, also has a mass seen on CT in her left lower lobe which may contribute. Improved. ?Dementia without behavioral disturbance ([place]) -Delirium precautions ?RA (rheumatoid arthritis) (HCC) -Continue home methotrexate 2.5 mg tablet every  ?HLD (hyperlipidemia) -Not on statin ?HTN (hypertension) -continue diltiazem ? ?Scheduled Meds: ? (feeding supplement) PROSource Plus  30 mL Oral BID BM  ? apixaban  5 mg Oral BID  ? cholecalciferol  1,000 Units Oral Daily  ? diltiazem  180 mg Oral Daily  ? feeding supplement  237 mL Oral TID BM  ? ferrous sulfate  325 mg Oral Q breakfast  ? fluorouracil   Topical BID  ? folic  acid  1 mg Oral Daily  ? methotrexate  17.5 mg Oral Q Tue  ? multivitamin-lutein  1 capsule Oral BID  ? ?Continuous Infusions: ?PRN Meds:.HYDROcodone-acetaminophen, ondansetron **OR** ondansetron (ZOFRAN) IV, traZODone ? ?Diet Orders  (From admission, onward)  ? ?  Start     Ordered  ? 08/31/21 1214  Diet regular Room service appropriate? Yes; Fluid consistency: Thin  Diet effective now       ?Question Answer Comment  ?Room service appropriate? Yes   ?Fluid consistency: Thin   ?  ? 08/31/21 1214  ? ?  ?  ? ?  ? ? ?DVT prophylaxis: Place TED hose Start: 08/30/21 1836 ?apixaban (ELIQUIS) tablet 5 mg  ? ?Lab Results  ?Component Value Date  ? PLT 241 09/05/2021  ? ? ?  Code Status: Full Code ? ?Family Communication: daughter at bedside  ? ?Status is: Inpatient ? ?Remains inpatient appropriate because: Awaiting placement ? ?Level of care: Med-Surg ? ?Consultants:  ?none ? ?Procedures:  ?none ? ?Microbiology  ?none ? ?Antimicrobials: ?none  ? ? ?Objective: ?Vitals:  ? 09/04/21 1621 09/04/21 2009 09/05/21 0519 09/05/21 0837  ?BP: (!) 113/56 113/71 113/64 122/79  ?Pulse: 80 76 90 78  ?Resp: 16 16 17 17   ?Temp: 98.4 ?F (36.9 ?C) 98 ?F (36.7 ?C) 97.9 ?F (36.6 ?C) 97.6 ?F (36.4 ?C)  ?TempSrc:  Oral Oral Oral  ?SpO2: 97% 97% 94% 96%  ?Weight:      ?Height:      ? ? ?Intake/Output Summary (Last 24 hours) at 09/05/2021 1345 ?Last data filed at 09/05/2021 2947 ?Gross per 24 hour  ?Intake 236 ml  ?Output 600 ml  ?Net -364 ml  ? ?Wt Readings from Last 3 Encounters:  ?08/30/21 59.9 kg  ?12/27/20 74 kg  ?12/19/20 74.4 kg  ? ? ?Examination: ? ?Constitutional: NAD ?Eyes: no scleral icterus ?ENMT: Mucous membranes are moist.  ?Neck: normal, supple ?Respiratory: clear to auscultation bilaterally, no wheezing, no crackles.  ?Cardiovascular: Regular rate and rhythm, no murmurs / rubs / gallops. ?Abdomen: non distended, no tenderness. Bowel sounds positive.  ?Musculoskeletal: no clubbing / cyanosis.  ?Skin: no rashes ?Neurologic: non focal  ? ?Data Reviewed: I have independently reviewed following labs and imaging studies ? ?CBC ?Recent Labs  ?Lab 08/30/21 ?1450 08/31/21 ?0435 09/05/21 ?0420  ?WBC 10.7* 9.3 10.8*  ?HGB 15.7* 15.0 13.9  ?HCT 47.9* 45.3 41.8  ?PLT 267 241 241   ?MCV 103.0* 103.7* 103.5*  ?MCH 33.8 34.3* 34.4*  ?MCHC 32.8 33.1 33.3  ?RDW 14.2 14.2 13.5  ? ? ?Recent Labs  ?Lab 08/30/21 ?1448 08/30/21 ?1450 08/30/21 ?1633 08/30/21 ?1909 08/31/21 ?0435 09/01/21 ?6546 09/02/21 ?5035  ?NA  --  135  --   --  139 136 135  ?K  --  3.6  --   --  3.2* 3.7 3.6  ?CL  --  102  --   --  107 107 106  ?CO2  --  20*  --   --  22 22 22   ?GLUCOSE  --  117*  --   --  77 100* 107*  ?BUN  --  27*  --   --  22 17 16   ?CREATININE  --  0.97  --   --  0.78 0.70 0.70  ?CALCIUM  --  9.3  --   --  8.6* 8.9 8.7*  ?AST 43*  --   --   --   --   --   --   ?  ALT 19  --   --   --   --   --   --   ?ALKPHOS 94  --   --   --   --   --   --   ?BILITOT 1.1  --   --   --   --   --   --   ?ALBUMIN 2.7*  --   --   --   --   --   --   ?MG  --   --   --   --   --  1.5* 1.8  ?DDIMER  --  5.76*  --   --   --   --   --   ?PROCALCITON  --   --   --  <0.10  --   --   --   ?LATICACIDVEN  --   --  1.2 1.4  --   --   --   ?TSH  --   --   --  2.146  --   --   --   ?BNP 129.9*  --   --   --   --   --   --   ? ? ?------------------------------------------------------------------------------------------------------------------ ?No results for input(s): CHOL, HDL, LDLCALC, TRIG, CHOLHDL, LDLDIRECT in the last 72 hours. ? ?No results found for: HGBA1C ?------------------------------------------------------------------------------------------------------------------ ?No results for input(s): TSH, T4TOTAL, T3FREE, THYROIDAB in the last 72 hours. ? ?Invalid input(s): FREET3 ? ?Cardiac Enzymes ?No results for input(s): CKMB, TROPONINI, MYOGLOBIN in the last 168 hours. ? ?Invalid input(s): CK ?------------------------------------------------------------------------------------------------------------------ ?   ?Component Value Date/Time  ? BNP 129.9 (H) 08/30/2021 1448  ? ? ?CBG: ?Recent Labs  ?Lab 09/01/21 ?1113  ?GLUCAP 183*  ? ? ?Recent Results (from the past 240 hour(s))  ?Urine Culture     Status: Abnormal  ? Collection Time:  08/30/21  6:23 PM  ? Specimen: Urine, Random  ?Result Value Ref Range Status  ? Specimen Description   Final  ?  URINE, RANDOM ?Performed at [facility], [address], [place], [place] [address]

## 2021-09-05 NOTE — Progress Notes (Signed)
PT Cancellation Note ? ?Patient Details ?Name: Angela Leonard ?MRN: [id number] ?DOB: [birth year] ? ? ?Cancelled Treatment:    Reason Eval/Treat Not Completed: Patient declined, no reason specified. Patient pleasant but declined any PT. Daughter at bedside. Will continue to attempt as appropriate.   ? ? ?Radford Pease ?09/05/2021, 3:42 PM ?

## 2021-09-05 NOTE — Plan of Care (Signed)

## 2021-09-05 NOTE — TOC Progression Note (Signed)
Transition of Care (TOC) - Progression Note  ? ? ?Patient Details  ?Name: Angela Leonard ?MRN: [id number] ?Date of Birth: 1934/10/12 ? ?Transition of Care (TOC) CM/SW Contact  ?Pete Pelt, RN ?Phone Number: ?09/05/2021, 12:48 PM ? ?Clinical Narrative:   RNCM called daughter and daughter was not available to issue decision regarding which SNF was decided. ? ? ? ?  ?  ? ?Expected Discharge Plan and Services ?  ?  ?  ?  ?  ?                ?  ?  ?  ?  ?  ?  ?  ?  ?  ?  ? ? ?Social Determinants of Health (SDOH) Interventions ?  ? ?Readmission Risk Interventions ? ?  08/13/2020  ?  1:17 PM  ?Readmission Risk Prevention Plan  ?Transportation Screening Complete  ?PCP or Specialist Appt within 3-5 Days Complete  ?[place] or Home Care Consult Complete  ?Social Work Consult for [place] Planning/Counseling Complete  ?Palliative Care Screening Not Applicable  ?Medication Review Press photographer) Complete  ? ? ?

## 2021-09-05 NOTE — Progress Notes (Signed)
Patient requires verbal guidance when taking vital signs. Will educate tech. She moves a lot during bp reading which causes a high initial bp and does not close mouth completely for oral temp causing a low temp.  ?

## 2021-09-06 DIAGNOSIS — J9601 Acute respiratory failure with hypoxia: Secondary | ICD-10-CM | POA: Diagnosis not present

## 2021-09-06 NOTE — TOC Progression Note (Signed)
Transition of Care (TOC) - Progression Note  ? ? ?Patient Details  ?Name: Angela Leonard ?MRN: [id number] ?Date of Birth: Mar 01, 1935 ? ?Transition of Care (TOC) CM/SW Contact  ?Pete Pelt, RN ?Phone Number: ?09/06/2021, 3:54 PM ? ?Clinical Narrative:  Patient's daughter chose [facility], Magda Paganini aware.  Will need to wait until 4/17 for discharge.  Care team aware.  ? ? ? ?  ?  ? ?Expected Discharge Plan and Services ?  ?  ?  ?  ?  ?                ?  ?  ?  ?  ?  ?  ?  ?  ?  ?  ? ? ?Social Determinants of Health (SDOH) Interventions ?  ? ?Readmission Risk Interventions ? ?  08/13/2020  ?  1:17 PM  ?Readmission Risk Prevention Plan  ?Transportation Screening Complete  ?PCP or Specialist Appt within 3-5 Days Complete  ?[place] or Home Care Consult Complete  ?Social Work Consult for [place] Planning/Counseling Complete  ?Palliative Care Screening Not Applicable  ?Medication Review Press photographer) Complete  ? ? ?

## 2021-09-06 NOTE — Progress Notes (Signed)
PT Cancellation Note ? ?Patient Details ?Name: Sariya Trickey ?MRN: [id number] ?DOB: [birth year] ? ? ?Cancelled Treatment:    Reason Eval/Treat Not Completed: Fatigue/lethargy limiting ability to participate. Patient sleeping soundly, daughter at bedside. Will continue to re-attempt as appropriate.  ? ? ?Sharea Guinther ?09/06/2021, 1:22 PM ?

## 2021-09-06 NOTE — Progress Notes (Signed)
OT Cancellation Note ? ?Patient Details ?Name: Kathya Wilz ?MRN: [id number] ?DOB: 04/18/1935 ? ? ?Cancelled Treatment:    Reason Eval/Treat Not Completed: Patient declined, no reason specified. Pt sleeping soundly, daughter present in room. Daughter reports pt slept very poorly last night and has been sleepy all day today. Requests that OT try again tomorrow. ? ?Josiah Lobo, PhD, MS, OTR/L ?09/06/21, 3:02 PM ? ?

## 2021-09-06 NOTE — Progress Notes (Signed)
?PROGRESS NOTE ? ?Angela Leonard KZS:[id number] DOB: [birth year] DOA: 08/30/2021 ?[facility]P: [facility] [place] ? ? LOS: 6 days  ? ?Brief Narrative / Interim history: ?Angela Leonard is an 87 year old female with history of  recent COVID infection 1 month ago, rheumatoid arthritis seropositive on methotrexate, insomnia, CKD stage IIIa, osteoarthritis, history squamous cell carcinoma of skin, hypertension, atrial fibrillation on Eliquis, hyperlipidemia, depression, who presented to the ED on 08/30/2021 with shortness of breath with oxygen desaturation to 88% at home, multiple falls at home in the past week. UA consistent with infection.  Started on empiric Rocephin pending urine culture results.  Urine culture returned with multiple species. Hypoxia - resolved quickly, on room air and stable.  She reported dyspnea ongoing for months.  CTA chest ruled out PE, but showed emphysema, and a focal mass vs consolidation in the left lower lobe.  ? ?Subjective / 24h Interval events: ?No significant overnight events.  Appears comfortable this morning.  Resting ? ?Assesement and Plan: ?Principal Problem: ?  Acute respiratory failure with hypoxia (HCC) ?Active Problems: ?  Arthritis of right hip ?  Acute lower UTI ?  Leukocytosis ?  Chronic atrial fibrillation, unspecified ([place]) ?  HTN (hypertension) ?  HLD (hyperlipidemia) ?  RA (rheumatoid arthritis) ([place]) ?  Dementia without behavioral disturbance ([place]) ?  Shortness of breath ?  History of COVID-19 ?  Skin cancer ?  Recurrent falls ?  Generalized weakness ?  Hypomagnesemia ?  Protein-calorie malnutrition, severe ? ? ?Assessment and Plan: ?Principal problem ?Acute respiratory failure with hypoxia (HCC) -POA, resolved.  Sats stable on room air. ?Patient noted to have oxygen desaturation to 88% on room air at home, reporting couple months history of shortness of breath.  CTA chest was negative for PE.  Lower extremity Doppler ultrasound negative for DVT bilaterally. CTA chest did  show a focal mass in the left lower lobe up to 3.1 cm max diameter. 36-month follow-up CT recommended if high risk or if findings would change management. Discussed with daughter, she indicated if this were felt to be malignancy they would not likely pursue aggressive treatment. ? ?Arthritis of right hip -Analgesia PRN. ?Acute lower UTI -completed Rocephin ?Leukocytosis -Initial WBC 10.7, improved to 9.3. Suspected due to UTI. ?Chronic atrial fibrillation, unspecified ([place]) -continue Eliquis ?Hypomagnesemia -monitor and replete ?Generalized weakness -With multiple falls at home in the week prior to admission.   ?PT/OT recommend SNF, daughter agreeable.   ?Recurrent falls -With baseline frailty, cognitive deficits, Covid infection last month.  See generalized weakness. ?Skin cancer -Status post excision at dermatology office and is currently on 5-FU cream daily -resumed ?History of COVID-19-Recent history of COVID-19 infection in March 2023.  Still testing positive this admission.  Asymptomatic other than the shortness of breath she mentioned on admission.   ?Shortness of breath -Likely in part due to recent COVID infection, also has a mass seen on CT in her left lower lobe which may contribute. Improved. ?Dementia without behavioral disturbance ([place]) -Delirium precautions ?RA (rheumatoid arthritis) (HCC) -Continue home methotrexate 2.5 mg tablet every  ?HLD (hyperlipidemia) -Not on statin ?HTN (hypertension) -continue diltiazem ? ?Scheduled Meds: ? (feeding supplement) PROSource Plus  30 mL Oral BID BM  ? apixaban  5 mg Oral BID  ? cholecalciferol  1,000 Units Oral Daily  ? diltiazem  180 mg Oral Daily  ? feeding supplement  237 mL Oral TID BM  ? ferrous sulfate  325 mg Oral Q breakfast  ? fluorouracil  Topical BID  ? folic acid  1 mg Oral Daily  ? methotrexate  17.5 mg Oral Q Tue  ? multivitamin-lutein  1 capsule Oral BID  ? ?Continuous Infusions: ?PRN Meds:.HYDROcodone-acetaminophen, ondansetron **OR** ondansetron  (ZOFRAN) IV, traZODone ? ?Diet Orders (From admission, onward)  ? ?  Start     Ordered  ? 08/31/21 1214  Diet regular Room service appropriate? Yes; Fluid consistency: Thin  Diet effective now       ?Question Answer Comment  ?Room service appropriate? Yes   ?Fluid consistency: Thin   ?  ? 08/31/21 1214  ? ?  ?  ? ?  ? ? ?DVT prophylaxis: Place TED hose Start: 08/30/21 1836 ?apixaban (ELIQUIS) tablet 5 mg  ? ?Lab Results  ?Component Value Date  ? PLT 241 09/05/2021  ? ? ?  Code Status: Full Code ? ?Family Communication: daughter at bedside  ? ?Status is: Inpatient ? ?Remains inpatient appropriate because: Awaiting placement ? ?Level of care: Med-Surg ? ?Consultants:  ?none ? ?Procedures:  ?none ? ?Microbiology  ?none ? ?Antimicrobials: ?none  ? ? ?Objective: ?Vitals:  ? 09/05/21 0519 09/05/21 0837 09/05/21 1950 09/06/21 0803  ?BP: 113/64 122/79 104/68 91/65  ?Pulse: 90 78 91 71  ?Resp: 17 17 18 19   ?Temp: 97.9 ?F (36.6 ?C) 97.6 ?F (36.4 ?C) 98 ?F (36.7 ?C) 98.1 ?F (36.7 ?C)  ?TempSrc: Oral Oral    ?SpO2: 94% 96% 96% 97%  ?Weight:      ?Height:      ? ?No intake or output data in the 24 hours ending 09/06/21 0807 ? ?Wt Readings from Last 3 Encounters:  ?08/30/21 59.9 kg  ?12/27/20 74 kg  ?12/19/20 74.4 kg  ? ? ?Examination: ? ?Constitutional: NAD ?Respiratory: CTA ?Cardiovascular: RRR. ? ?Data Reviewed: I have independently reviewed following labs and imaging studies ? ?CBC ?Recent Labs  ?Lab 08/30/21 ?1450 08/31/21 ?0435 09/05/21 ?0420  ?WBC 10.7* 9.3 10.8*  ?HGB 15.7* 15.0 13.9  ?HCT 47.9* 45.3 41.8  ?PLT 267 241 241  ?MCV 103.0* 103.7* 103.5*  ?MCH 33.8 34.3* 34.4*  ?MCHC 32.8 33.1 33.3  ?RDW 14.2 14.2 13.5  ? ? ? ?Recent Labs  ?Lab 08/30/21 ?1448 08/30/21 ?1450 08/30/21 ?1633 08/30/21 ?1909 08/31/21 ?0435 09/01/21 ?7096 09/02/21 ?2836  ?NA  --  135  --   --  139 136 135  ?K  --  3.6  --   --  3.2* 3.7 3.6  ?CL  --  102  --   --  107 107 106  ?CO2  --  20*  --   --  22 22 22   ?GLUCOSE  --  117*  --   --  77 100*  107*  ?BUN  --  27*  --   --  22 17 16   ?CREATININE  --  0.97  --   --  0.78 0.70 0.70  ?CALCIUM  --  9.3  --   --  8.6* 8.9 8.7*  ?AST 43*  --   --   --   --   --   --   ?ALT 19  --   --   --   --   --   --   ?ALKPHOS 94  --   --   --   --   --   --   ?BILITOT 1.1  --   --   --   --   --   --   ?ALBUMIN 2.7*  --   --   --   --   --   --   ?  MG  --   --   --   --   --  1.5* 1.8  ?DDIMER  --  5.76*  --   --   --   --   --   ?PROCALCITON  --   --   --  <0.10  --   --   --   ?LATICACIDVEN  --   --  1.2 1.4  --   --   --   ?TSH  --   --   --  2.146  --   --   --   ?BNP 129.9*  --   --   --   --   --   --   ? ? ? ?------------------------------------------------------------------------------------------------------------------ ?No results for input(s): CHOL, HDL, LDLCALC, TRIG, CHOLHDL, LDLDIRECT in the last 72 hours. ? ?No results found for: HGBA1C ?------------------------------------------------------------------------------------------------------------------ ?No results for input(s): TSH, T4TOTAL, T3FREE, THYROIDAB in the last 72 hours. ? ?Invalid input(s): FREET3 ? ?Cardiac Enzymes ?No results for input(s): CKMB, TROPONINI, MYOGLOBIN in the last 168 hours. ? ?Invalid input(s): CK ?------------------------------------------------------------------------------------------------------------------ ?   ?Component Value Date/Time  ? BNP 129.9 (H) 08/30/2021 1448  ? ? ?CBG: ?Recent Labs  ?Lab 09/01/21 ?1113  ?GLUCAP 183*  ? ? ? ?Recent Results (from the past 240 hour(s))  ?Urine Culture     Status: Abnormal  ? Collection Time: 08/30/21  6:23 PM  ? Specimen: Urine, Random  ?Result Value Ref Range Status  ? Specimen Description   Final  ?  URINE, RANDOM ?Performed at [facility], [address] [place], [place] [address] ?  ? Special Requests   Final  ?  NONE ?Performed at [facility], [address] [place], [place] [address] ?  ? Culture MULTIPLE SPECIES PRESENT, SUGGEST RECOLLECTION (A)  Final  ?  Report Status 09/01/2021 FINAL  Final  ?Resp Panel by RT-PCR (Flu A&B, Covid) Nasopharyngeal Swab     Status: Abnormal  ? Collection Time: 08/30/21  7:35 PM  ? Specimen: Nasopharyngeal Swab; Nasopharyngeal

## 2021-09-06 NOTE — Progress Notes (Signed)
Patient refused to ambulate last night. During rounds, found patient scratching into the wound on her head. Educated patient about the risk of a serious infection from placing her hands where there is no skin. Patient continued to place hands into wound stating that she couldn't help it and that it was itchy. After cleaning and changing dressing, I added the ACE wrap from extra protection to wound bed.  ?

## 2021-09-07 DIAGNOSIS — J9601 Acute respiratory failure with hypoxia: Secondary | ICD-10-CM | POA: Diagnosis not present

## 2021-09-07 NOTE — Progress Notes (Signed)
Occupational Therapy Treatment ?Patient Details ?Name: Angela Leonard ?MRN: [id number] ?DOB: 03-Aug-1934 ?Today's Date: 09/07/2021 ? ? ?History of present illness Angela Leonard is a 87 y.o. female with history of atrial fibrillation, recent COVID infection 1 month ago, hypertension presents to the emergency department with shortness of breath and multiple falls this week. ?  ?OT comments ? Angela Leonard was seen for OT treatment on this date. Upon arrival to room pt reclined in bed, found with gown doffed and drink spilled on blankets. Pt requires MAX A doff B socks at bed level. SETUP self-drinking at bed level. MIN A sup>long sitting at bed level and MAX A apply lotion to back in long sitting at pt request. MIN A don gown long sitting. Initiated bed level therex, pt tolerates 2 reps of BLE heel slides prior to refusing further attempts. Encouraged continued hydration. Will continue to follow POC. Discharge recommendation remains appropriate.  ?  ? ?Recommendations for follow up therapy are one component of a multi-disciplinary discharge planning process, led by the attending physician.  Recommendations may be updated based on patient status, additional functional criteria and insurance authorization. ?   ?Follow Up Recommendations ? Skilled nursing-short term rehab (<3 hours/day)  ?  ?Assistance Recommended at Discharge Frequent or constant Supervision/Assistance  ?Patient can return home with the following ? A lot of help with walking and/or transfers;A lot of help with bathing/dressing/bathroom ?  ?Equipment Recommendations ? Hospital bed  ?  ?Recommendations for Other Services   ? ?  ?Precautions / Restrictions Precautions ?Precautions: Fall ?Restrictions ?Weight Bearing Restrictions: No  ? ? ?  ? ?Mobility Bed Mobility ?Overal bed mobility: Needs Assistance ?  ?  ?  ?  ?  ?  ?General bed mobility comments: MIN A sup<>long sitting ?  ? ? ?  ?  ?  ?  ?  ?  ?  ?  ?  ?   ? ?ADL either performed or assessed with clinical  judgement  ? ?ADL Overall ADL's : Needs assistance/impaired ?  ?  ?  ?  ?  ?  ?  ?  ?  ?  ?  ?  ?  ?  ?  ?  ?  ?  ?  ?General ADL Comments: MAX A doff B socks at bed level. SETUP self-drinking at bed level MAX A apply lotion to back in long sitting. MIN A don gown long sitting ?  ? ? ? ?Cognition Arousal/Alertness: Awake/alert ?Behavior During Therapy: Anxious ?Overall Cognitive Status: History of cognitive impairments - at baseline ?  ?  ?  ?  ?  ?  ?  ?  ?  ?  ?  ?  ?  ?  ?  ?  ?General Comments: oriented to self only ?  ?  ?   ?   ?   ?   ? ? ?Pertinent Vitals/ Pain       Pain Assessment ?Pain Assessment: Faces ?Faces Pain Scale: Hurts little more ?Pain Location: R calf to touch ?Pain Descriptors / Indicators: Discomfort, Grimacing, Crying ?Pain Intervention(s): Limited activity within patient's tolerance, Repositioned ? ? ?Frequency ? Min 2X/week  ? ? ? ? ?  ?Progress Toward Goals ? ?OT Goals(current goals can now be found in the care plan section) ? Progress towards OT goals: Progressing toward goals ? ?Acute Rehab OT Goals ?Patient Stated Goal: to go home ?OT Goal Formulation: With patient ?Time For Goal Achievement: 09/14/21 ?Potential to Achieve Goals: Good ?ADL Goals ?  Pt Will Perform Grooming: sitting;with min guard assist ?Pt Will Perform Lower Body Dressing: with min assist;sitting/lateral leans ?Pt Will Transfer to Toilet: with min assist;bedside commode;stand pivot transfer  ?Plan Discharge plan remains appropriate;Frequency remains appropriate   ? ?Co-evaluation ? ? ?   ?  ?  ?  ?  ? ?  ?AM-PAC OT "6 Clicks" Daily Activity     ?Outcome Measure ? ? Help from another person eating meals?: A Little ?Help from another person taking care of personal grooming?: A Little ?Help from another person toileting, which includes using toliet, bedpan, or urinal?: A Lot ?Help from another person bathing (including washing, rinsing, drying)?: A Lot ?Help from another person to put on and taking off regular upper body  clothing?: A Little ?Help from another person to put on and taking off regular lower body clothing?: A Lot ?6 Click Score: 15 ? ?  ?End of Session   ? ?OT Visit Diagnosis: Other abnormalities of gait and mobility (R26.89);Muscle weakness (generalized) (M62.81) ?  ?Activity Tolerance Patient limited by fatigue ?  ?Patient Left in bed;with call C[place]/phone within reach;with bed alarm set ?  ?Nurse Communication   ?  ? ?   ? ?Time: 9357-0177 ?OT Time Calculation (min): 11 min ? ?Charges: OT General Charges ?$OT Visit: 1 Visit ?OT Treatments ?$Self Care/Home Management : 8-22 mins ? ?Dessie Coma, M.S. OTR/L  ?09/07/21, 2:45 PM  ?ascom [phone] ? ?

## 2021-09-07 NOTE — Progress Notes (Signed)
PT Cancellation Note ? ?Patient Details ?Name: Angela Leonard ?MRN: [id number] ?DOB: 1934/06/15 ? ? ?Cancelled Treatment:    Reason Eval/Treat Not Completed: Fatigue/lethargy limiting ability to participate Due to patient being tired from OT treatment, and patient's inability to remain awake, will re-attempt at a later time/date as available and patient medically appropriate for PT. Thank you! ? ? ?Iva Boop, PT  ?09/07/21. 3:03 PM ? ?

## 2021-09-07 NOTE — Progress Notes (Signed)
?PROGRESS NOTE ? ?Angela Leonard MEQ:[id number] DOB: 1934/09/15 DOA: 08/30/2021 ?PCP: [facility] [place] ? ? LOS: 7 days  ? ?Brief Narrative / Interim history: ?Angela Leonard is an 87 year old female with history of  recent COVID infection 1 month ago, rheumatoid arthritis seropositive on methotrexate, insomnia, CKD stage IIIa, osteoarthritis, history squamous cell carcinoma of skin, hypertension, atrial fibrillation on Eliquis, hyperlipidemia, depression, who presented to the ED on 08/30/2021 with shortness of breath with oxygen desaturation to 88% at home, multiple falls at home in the past week. UA consistent with infection.  Started on empiric Rocephin pending urine culture results.  Urine culture returned with multiple species. Hypoxia - resolved quickly, on room air and stable.  She reported dyspnea ongoing for months.  CTA chest ruled out PE, but showed emphysema, and a focal mass vs consolidation in the left lower lobe.  ? ?Subjective / 24h Interval events: ?No complaints, denies any chest pain, denies any shortness of breath.  No abdominal pain, no nausea or vomiting. ? ?Assesement and Plan: ?Principal Problem: ?  Acute respiratory failure with hypoxia ([place]) ?Active Problems: ?  Arthritis of right hip ?  Acute lower UTI ?  Leukocytosis ?  Chronic atrial fibrillation, unspecified ([place]) ?  HTN (hypertension) ?  HLD (hyperlipidemia) ?  RA (rheumatoid arthritis) ([place]) ?  Dementia without behavioral disturbance ([place]) ?  Shortness of breath ?  History of COVID-19 ?  Skin cancer ?  Recurrent falls ?  Generalized weakness ?  Hypomagnesemia ?  Protein-calorie malnutrition, severe ? ? ?Assessment and Plan: ?Principal problem ?Acute respiratory failure with hypoxia (HCC) -POA, resolved.  Sats stable on room air. ?Patient noted to have oxygen desaturation to 88% on room air at home, reporting couple months history of shortness of breath.  CTA chest was negative for PE.  Lower extremity Doppler ultrasound negative  for DVT bilaterally. CTA chest did show a focal mass in the left lower lobe up to 3.1 cm max diameter. 36-month follow-up CT recommended if high risk or if findings would change management. Discussed with daughter, she indicated if this were felt to be malignancy they would not likely pursue aggressive treatment. ? ?Arthritis of right hip -Analgesia PRN. ?Acute lower UTI -completed Rocephin ?Leukocytosis -Initial WBC 10.7, improved to 9.3. Suspected due to UTI. ?Chronic atrial fibrillation, unspecified ([place]) -continue Eliquis ?Hypomagnesemia -monitor and replete ?Generalized weakness -With multiple falls at home in the week prior to admission.   ?PT/OT recommend SNF, daughter agreeable.   ?Recurrent falls -With baseline frailty, cognitive deficits, Covid infection last month.  See generalized weakness. ?Skin cancer -Status post excision at dermatology office and is currently on 5-FU cream daily -resumed ?History of COVID-19-Recent history of COVID-19 infection in March 2023.  Still testing positive this admission.  Asymptomatic other than the shortness of breath she mentioned on admission.   ?Shortness of breath -Likely in part due to recent COVID infection, also has a mass seen on CT in her left lower lobe which may contribute. Improved. ?Dementia without behavioral disturbance ([place]) -Delirium precautions ?RA (rheumatoid arthritis) (HCC) -Continue home methotrexate 2.5 mg tablet every  ?HLD (hyperlipidemia) -Not on statin ?HTN (hypertension) -continue diltiazem ? ?Scheduled Meds: ? (feeding supplement) PROSource Plus  30 mL Oral BID BM  ? apixaban  5 mg Oral BID  ? cholecalciferol  1,000 Units Oral Daily  ? diltiazem  180 mg Oral Daily  ? feeding supplement  237 mL Oral TID BM  ? ferrous sulfate  325 mg  Oral Q breakfast  ? fluorouracil   Topical BID  ? folic acid  1 mg Oral Daily  ? methotrexate  17.5 mg Oral Q Tue  ? multivitamin-lutein  1 capsule Oral BID  ? ?Continuous Infusions: ?PRN  Meds:.HYDROcodone-acetaminophen, ondansetron **OR** ondansetron (ZOFRAN) IV, traZODone ? ?Diet Orders (From admission, onward)  ? ?  Start     Ordered  ? 08/31/21 1214  Diet regular Room service appropriate? Yes; Fluid consistency: Thin  Diet effective now       ?Question Answer Comment  ?Room service appropriate? Yes   ?Fluid consistency: Thin   ?  ? 08/31/21 1214  ? ?  ?  ? ?  ? ? ?DVT prophylaxis: Place TED hose Start: 08/30/21 1836 ?apixaban (ELIQUIS) tablet 5 mg  ? ?Lab Results  ?Component Value Date  ? PLT 241 09/05/2021  ? ? ?  Code Status: Full Code ? ?Family Communication: daughter at bedside  ? ?Status is: Inpatient ? ?Remains inpatient appropriate because: Awaiting placement, plan for Monday 4/17 ? ?Level of care: Med-Surg ? ?Consultants:  ?none ? ?Procedures:  ?none ? ?Microbiology  ?none ? ?Antimicrobials: ?none  ? ? ?Objective: ?Vitals:  ? 09/06/21 1607 09/06/21 2015 09/07/21 0423 09/07/21 0740  ?BP: 124/86 118/75 113/72 (!) 155/134  ?Pulse: 90 100 (!) 108 (!) 110  ?Resp: 18 18 18 16   ?Temp: 98.2 ?F (36.8 ?C) 98 ?F (36.7 ?C) 98.4 ?F (36.9 ?C) 98.3 ?F (36.8 ?C)  ?TempSrc:  Oral    ?SpO2: 95% 95% 93% 90%  ?Weight:      ?Height:      ? ? ?Intake/Output Summary (Last 24 hours) at 09/07/2021 0752 ?Last data filed at 09/07/2021 0424 ?Gross per 24 hour  ?Intake --  ?Output 1275 ml  ?Net -1275 ml  ? ? ?Wt Readings from Last 3 Encounters:  ?08/30/21 59.9 kg  ?12/27/20 74 kg  ?12/19/20 74.4 kg  ? ? ?Examination: ? ?Constitutional: NAD ?Respiratory: CTA ?Cardiovascular: Regular rate and rhythm. ? ?Data Reviewed: I have independently reviewed following labs and imaging studies ? ?CBC ?Recent Labs  ?Lab 09/05/21 ?0420  ?WBC 10.8*  ?HGB 13.9  ?HCT 41.8  ?PLT 241  ?MCV 103.5*  ?MCH 34.4*  ?MCHC 33.3  ?RDW 13.5  ? ? ? ?Recent Labs  ?Lab 09/01/21 ?0488 09/02/21 ?8916  ?NA 136 135  ?K 3.7 3.6  ?CL 107 106  ?CO2 22 22  ?GLUCOSE 100* 107*  ?BUN 17 16  ?CREATININE 0.70 0.70  ?CALCIUM 8.9 8.7*  ?MG 1.5* 1.8   ? ? ? ?------------------------------------------------------------------------------------------------------------------ ?No results for input(s): CHOL, HDL, LDLCALC, TRIG, CHOLHDL, LDLDIRECT in the last 72 hours. ? ?No results found for: HGBA1C ?------------------------------------------------------------------------------------------------------------------ ?No results for input(s): TSH, T4TOTAL, T3FREE, THYROIDAB in the last 72 hours. ? ?Invalid input(s): FREET3 ? ?Cardiac Enzymes ?No results for input(s): CKMB, TROPONINI, MYOGLOBIN in the last 168 hours. ? ?Invalid input(s): CK ?------------------------------------------------------------------------------------------------------------------ ?   ?Component Value Date/Time  ? BNP 129.9 (H) 08/30/2021 1448  ? ? ?CBG: ?Recent Labs  ?Lab 09/01/21 ?1113  ?GLUCAP 183*  ? ? ? ?Recent Results (from the past 240 hour(s))  ?Urine Culture     Status: Abnormal  ? Collection Time: 08/30/21  6:23 PM  ? Specimen: Urine, Random  ?Result Value Ref Range Status  ? Specimen Description   Final  ?  URINE, RANDOM ?Performed at [facility], [address], [place], [place] [address] ?  ? Special Requests   Final  ?  NONE ?  Performed at [facility], [address] [place], [place] [address] ?  ? Culture MULTIPLE SPECIES PRESENT, SUGGEST RECOLLECTION (A)  Final  ? Report Status 09/01/2021 FINAL  Final  ?Resp Panel by RT-PCR (Flu A&B, Covid) Nasopharyngeal Swab     Status: Abnormal  ? Collection Time: 08/30/21  7:35 PM  ? Specimen: Nasopharyngeal Swab; Nasopharyngeal(NP) swabs in vial transport medium  ?Result Value Ref Range Status  ? SARS Coronavirus 2 by RT PCR POSITIVE (A) NEGATIVE Final  ?  Comment: (NOTE) ?SARS-CoV-2 target nucleic acids are DETECTED. ? ?The SARS-CoV-2 RNA is generally detectable in upper respiratory ?specimens during the acute phase of infection. Positive results are ?indicative of the presence of the identified virus, but do not rule ?out  bacterial infection or co-infection with other pathogens not ?detected by the test. Clinical correlation with patient history and ?other diagnostic information is necessary to determine patient ?infection status. The expected result is Negative. ?

## 2021-09-08 DIAGNOSIS — J9601 Acute respiratory failure with hypoxia: Secondary | ICD-10-CM | POA: Diagnosis not present

## 2021-09-08 LAB — BASIC METABOLIC PANEL
Anion gap: 9 (ref 5–15)
BUN: 27 mg/dL — ABNORMAL HIGH (ref 8–23)
CO2: 25 mmol/L (ref 22–32)
Calcium: 8.9 mg/dL (ref 8.9–10.3)
Chloride: 97 mmol/L — ABNORMAL LOW (ref 98–111)
Creatinine, Ser: 0.92 mg/dL (ref 0.44–1.00)
GFR, Estimated: >60 mL/min (ref >60)
Glucose, Bld: 91 mg/dL (ref 70–99)
Potassium: 4.3 mmol/L (ref 3.5–5.1)
Sodium: 131 mmol/L — ABNORMAL LOW (ref 135–145)

## 2021-09-08 LAB — CBC
HCT: 40.7 % (ref 36.0–46.0)
Hemoglobin: 13.6 g/dL (ref 12.0–15.0)
MCH: 34.1 pg — ABNORMAL HIGH (ref 26.0–34.0)
MCHC: 33.4 g/dL (ref 30.0–36.0)
MCV: 102 fL — ABNORMAL HIGH (ref 80.0–100.0)
Platelets: 244 10*3/uL (ref 150–400)
RBC: 3.99 MIL/uL (ref 3.87–5.11)
RDW: 13.7 % (ref 11.5–15.5)
WBC: 10.9 10*3/uL — ABNORMAL HIGH (ref 4.0–10.5)
nRBC: 0 % (ref 0.0–0.2)

## 2021-09-08 LAB — CREATININE, SERUM
Creatinine, Ser: 0.81 mg/dL (ref 0.44–1.00)
GFR, Estimated: >60 mL/min (ref >60)

## 2021-09-08 NOTE — Progress Notes (Signed)
?PROGRESS NOTE ? ?Angela Leonard JQB:[id number] DOB: [birth year] DOA: 08/30/2021 ?[facility]P: [facility] [place] ? ? LOS: 8 days  ? ?Brief Narrative / Interim history: ?[name] is an 87 year old female with history of  recent COVID infection 1 month ago, rheumatoid arthritis seropositive on methotrexate, insomnia, CKD stage IIIa, osteoarthritis, history squamous cell carcinoma of skin, hypertension, atrial fibrillation on Eliquis, hyperlipidemia, depression, who presented to the ED on 08/30/2021 with shortness of breath with oxygen desaturation to 88% at home, multiple falls at home in the past week. UA consistent with infection.  Started on empiric Rocephin pending urine culture results.  Urine culture returned with multiple species. Hypoxia - resolved quickly, on room air and stable.  She reported dyspnea ongoing for months.  CTA chest ruled out PE, but showed emphysema, and a focal mass vs consolidation in the left lower lobe.  ? ?Subjective / 24h Interval events: ?No complaints. ? ?Assesement and Plan: ?Principal Problem: ?  Acute respiratory failure with hypoxia ([place]) ?Active Problems: ?  Arthritis of right hip ?  Acute lower UTI ?  Leukocytosis ?  Chronic atrial fibrillation, unspecified ([place]) ?  HTN (hypertension) ?  HLD (hyperlipidemia) ?  RA (rheumatoid arthritis) ([place]) ?  Dementia without behavioral disturbance ([place]) ?  Shortness of breath ?  History of COVID-19 ?  Skin cancer ?  Recurrent falls ?  Generalized weakness ?  Hypomagnesemia ?  Protein-calorie malnutrition, severe ? ? ?Assessment and Plan: ?Principal problem ?Acute respiratory failure with hypoxia (HCC) -POA, resolved.  Sats stable on room air. ?Patient noted to have oxygen desaturation to 88% on room air at home, reporting couple months history of shortness of breath.  CTA chest was negative for PE.  Lower extremity Doppler ultrasound negative for DVT bilaterally. CTA chest did show a focal mass in the left lower lobe up to 3.1 cm max  diameter. 65-month follow-up CT recommended if high risk or if findings would change management. Discussed with daughter, she indicated if this were felt to be malignancy they would not likely pursue aggressive treatment. ? ?Arthritis of right hip -Analgesia PRN. ?Acute lower UTI -completed Rocephin ?Leukocytosis -Initial WBC 10.7, improved to 9.3. Suspected due to UTI. ?Chronic atrial fibrillation, unspecified ([place]) -continue Eliquis ?Hypomagnesemia -monitor and replete ?Generalized weakness -With multiple falls at home in the week prior to admission.   ?PT/OT recommend SNF, daughter agreeable.   ?Recurrent falls -With baseline frailty, cognitive deficits, Covid infection last month.  See generalized weakness. ?Skin cancer -Status post excision at dermatology office and is currently on 5-FU cream daily -resumed ?History of COVID-19-Recent history of COVID-19 infection in March 2023.  Still testing positive this admission.  Asymptomatic other than the shortness of breath she mentioned on admission.   ?Shortness of breath -Likely in part due to recent COVID infection, also has a mass seen on CT in her left lower lobe which may contribute. Improved. ?Dementia without behavioral disturbance ([place]) -Delirium precautions ?RA (rheumatoid arthritis) (HCC) -Continue home methotrexate 2.5 mg tablet every  ?HLD (hyperlipidemia) -Not on statin ?HTN (hypertension) -continue diltiazem ? ?Scheduled Meds: ? (feeding supplement) PROSource Plus  30 mL Oral BID BM  ? apixaban  5 mg Oral BID  ? cholecalciferol  1,000 Units Oral Daily  ? diltiazem  180 mg Oral Daily  ? feeding supplement  237 mL Oral TID BM  ? ferrous sulfate  325 mg Oral Q breakfast  ? fluorouracil   Topical BID  ? folic acid  1 mg  Oral Daily  ? methotrexate  17.5 mg Oral Q Tue  ? multivitamin-lutein  1 capsule Oral BID  ? ?Continuous Infusions: ?PRN Meds:.HYDROcodone-acetaminophen, ondansetron **OR** ondansetron (ZOFRAN) IV, traZODone ? ?Diet Orders (From admission,  onward)  ? ?  Start     Ordered  ? 08/31/21 1214  Diet regular Room service appropriate? Yes; Fluid consistency: Thin  Diet effective now       ?Question Answer Comment  ?Room service appropriate? Yes   ?Fluid consistency: Thin   ?  ? 08/31/21 1214  ? ?  ?  ? ?  ? ? ?DVT prophylaxis: Place TED hose Start: 08/30/21 1836 ?apixaban (ELIQUIS) tablet 5 mg  ? ?Lab Results  ?Component Value Date  ? PLT 241 09/05/2021  ? ? ?  Code Status: Full Code ? ?Family Communication: no family at bedside  ? ?Status is: Inpatient ? ?Remains inpatient appropriate because: Awaiting placement, plan for Monday 4/17 ? ?Level of care: Med-Surg ? ?Consultants:  ?none ? ?Procedures:  ?none ? ?Microbiology  ?none ? ?Antimicrobials: ?none  ? ? ?Objective: ?Vitals:  ? 09/07/21 1135 09/07/21 1548 09/07/21 2054 09/08/21 0451  ?BP: 133/88 104/64 115/72 127/70  ?Pulse: (!) 106 83 71 79  ?Resp: 15 15 18 18   ?Temp: 98 ?F (36.7 ?C) (!) 97.3 ?F (36.3 ?C) 97.8 ?F (36.6 ?C) 97.6 ?F (36.4 ?C)  ?TempSrc:   Oral Oral  ?SpO2: 93% 93% 97% 95%  ?Weight:      ?Height:      ? ? ?Intake/Output Summary (Last 24 hours) at 09/08/2021 0741 ?Last data filed at 09/07/2021 1812 ?Gross per 24 hour  ?Intake --  ?Output 800 ml  ?Net -800 ml  ? ? ?Wt Readings from Last 3 Encounters:  ?08/30/21 59.9 kg  ?12/27/20 74 kg  ?12/19/20 74.4 kg  ? ? ?Examination: ? ?Constitutional: nad ?Respiratory: cta ?Cardiovascular: rrr ? ?Data Reviewed: I have independently reviewed following labs and imaging studies ? ?CBC ?Recent Labs  ?Lab 09/05/21 ?0420  ?WBC 10.8*  ?HGB 13.9  ?HCT 41.8  ?PLT 241  ?MCV 103.5*  ?MCH 34.4*  ?MCHC 33.3  ?RDW 13.5  ? ? ? ?Recent Labs  ?Lab 09/02/21 ?8921 09/08/21 ?0629  ?NA 135  --   ?K 3.6  --   ?CL 106  --   ?CO2 22  --   ?GLUCOSE 107*  --   ?BUN 16  --   ?CREATININE 0.70 0.81  ?CALCIUM 8.7*  --   ?MG 1.8  --   ? ? ? ?------------------------------------------------------------------------------------------------------------------ ?No results for input(s): CHOL,  HDL, LDLCALC, TRIG, CHOLHDL, LDLDIRECT in the last 72 hours. ? ?No results found for: HGBA1C ?------------------------------------------------------------------------------------------------------------------ ?No results for input(s): TSH, T4TOTAL, T3FREE, THYROIDAB in the last 72 hours. ? ?Invalid input(s): FREET3 ? ?Cardiac Enzymes ?No results for input(s): CKMB, TROPONINI, MYOGLOBIN in the last 168 hours. ? ?Invalid input(s): CK ?------------------------------------------------------------------------------------------------------------------ ?   ?Component Value Date/Time  ? BNP 129.9 (H) 08/30/2021 1448  ? ? ?CBG: ?Recent Labs  ?Lab 09/01/21 ?1113  ?GLUCAP 183*  ? ? ? ?Recent Results (from the past 240 hour(s))  ?Urine Culture     Status: Abnormal  ? Collection Time: 08/30/21  6:23 PM  ? Specimen: Urine, Random  ?Result Value Ref Range Status  ? Specimen Description   Final  ?  URINE, RANDOM ?Performed at [facility], [address], [place], [place] [address] ?  ? Special Requests   Final  ?  NONE ?Performed at [facility], [address], [place], [place] [address] ?  ? Culture MULTIPLE SPECIES PRESENT, SUGGEST RECOLLECTION (A)  Final  ? Report Status 09/01/2021 FINAL  Final  ?Resp Panel by RT-PCR (Flu A&B, Covid) Nasopharyngeal Swab     Status: Abnormal  ? Collection Time: 08/30/21  7:35 PM  ? Specimen: Nasopharyngeal Swab; Nasopharyngeal(NP) swabs in vial transport medium  ?Result Value Ref Range Status  ? SARS Coronavirus 2 by RT PCR POSITIVE (A) NEGATIVE Final  ?  Comment: (NOTE) ?SARS-CoV-2 target nucleic acids are DETECTED. ? ?The SARS-CoV-2 RNA is generally detectable in upper respiratory ?specimens during the acute phase of infection. Positive results are ?indicative of the presence of the identified virus, but do not rule ?out bacterial infection or co-infection with other pathogens not ?detected by the test. Clinical correlation with patient history and ?other diagnostic  information is necessary to determine patient ?infection status. The expected result is Negative. ? ?Fact Sheet for Patients: ?EntrepreneurPulse.com.au ? ?Fact Sheet for Healthcare Providers: ?http

## 2021-09-08 NOTE — Progress Notes (Signed)
Physical Therapy Treatment ?Patient Details ?Name: Angela Leonard ?MRN: [id number] ?DOB: 08-Nov-1934 ?Today's Date: 09/08/2021 ? ? ?History of Present Illness Angela Leonard is a 87 y.o. female with history of atrial fibrillation, recent COVID infection 1 month ago, hypertension presents to the emergency department with shortness of breath and multiple falls this week. ? ?  ?PT Comments  ? ? Patient received in bed, she is initially declining therapy, but then found that her bed was soaking wet, gown wet, so she is agreeable to getting up to recliner to have bed changed. Patient is min A for supine to sit. Cues needed for scooting out to edge of bed. Min A for sit to stand and to take a few steps from bed to recliner. Patient pleasant this session. She will continue to benefit from skilled PT while here/as able to improve strength and functional independence.  ?     ?Recommendations for follow up therapy are one component of a multi-disciplinary discharge planning process, led by the attending physician.  Recommendations may be updated based on patient status, additional functional criteria and insurance authorization. ? ?Follow Up Recommendations ? Skilled nursing-short term rehab (<3 hours/day) ?  ?  ?Assistance Recommended at Discharge Frequent or constant Supervision/Assistance  ?Patient can return home with the following A lot of help with bathing/dressing/bathroom;Assist for transportation;Help with stairs or ramp for entrance;Direct supervision/assist for financial management;Direct supervision/assist for medications management;A little help with walking and/or transfers ?  ?Equipment Recommendations ? Rolling walker (2 wheels)  ?  ?Recommendations for Other Services   ? ? ?  ?Precautions / Restrictions Precautions ?Precautions: Fall ?Restrictions ?Weight Bearing Restrictions: No  ?  ? ?Mobility ? Bed Mobility ?Overal bed mobility: Modified Independent ?Bed Mobility: Supine to Sit ?  ?  ?Supine to sit:  Supervision ?  ?  ?General bed mobility comments: Cues needed to scoot to edge of bed. No physical assist needed. ?  ? ?Transfers ?Overall transfer level: Needs assistance ?Equipment used: Rolling walker (2 wheels) ?Transfers: Sit to/from Stand, Bed to chair/wheelchair/BSC ?Sit to Stand: Min assist ?  ?Step pivot transfers: Min assist ?  ?  ?  ?General transfer comment: patient able to stand and take a few steps from bed to recliner ?  ? ?Ambulation/Gait ?Ambulation/Gait assistance: Min assist ?Gait Distance (Feet): 4 Feet ?Assistive device: Rolling walker (2 wheels) ?Gait Pattern/deviations: Step-to pattern, Decreased step length - right, Decreased step length - left ?Gait velocity: decr ?  ?  ?General Gait Details: good ability with movement today. No real physical assist needed just for safety ? ? ?Stairs ?  ?  ?  ?  ?  ? ? ?Wheelchair Mobility ?  ? ?Modified Rankin (Stroke Patients Only) ?  ? ? ?  ?Balance Overall balance assessment: Needs assistance ?Sitting-balance support: Feet supported ?Sitting balance-[name] Scale: Fair ?Sitting balance - Comments: sba ?  ?Standing balance support: Bilateral upper extremity supported, During functional activity, Reliant on assistive device for balance ?Standing balance-[name] Scale: Fair ?Standing balance comment: min guard for moving from bed to recliner ?  ?  ?  ?  ?  ?  ?  ?  ?  ?  ?  ?  ? ?  ?Cognition Arousal/Alertness: Awake/alert ?Behavior During Therapy: [facility] for tasks assessed/performed ?Overall Cognitive Status: History of cognitive impairments - at baseline ?Area of Impairment: Awareness, Problem solving ?  ?  ?  ?  ?  ?  ?  ?  ?  ?  ?  ?  ?  ?  ?  ?  ?  ?  ? ?  ?  Exercises   ? ?  ?General Comments   ?  ?  ? ?Pertinent Vitals/Pain Pain Assessment ?Pain Assessment: No/denies pain  ? ? ?Home Living   ?  ?  ?  ?  ?  ?  ?  ?  ?  ?   ?  ?Prior Function    ?  ?  ?   ? ?PT Goals (current goals can now be found in the care plan section) Acute Rehab PT Goals ?PT Goal  Formulation: Patient unable to participate in goal setting ?Time For Goal Achievement: 09/15/21 ?Potential to Achieve Goals: Fair ?Progress towards PT goals: Progressing toward goals ? ?  ?Frequency ? ? ? Min 2X/week ? ? ? ?  ?PT Plan Current plan remains appropriate  ? ? ?Co-evaluation   ?  ?  ?  ?  ? ?  ?AM-PAC PT "6 Clicks" Mobility   ?Outcome Measure ? Help needed turning from your back to your side while in a flat bed without using bedrails?: A Lot ?Help needed moving from lying on your back to sitting on the side of a flat bed without using bedrails?: A Lot ?Help needed moving to and from a bed to a chair (including a wheelchair)?: A Little ?Help needed standing up from a chair using your arms (e.g., wheelchair or bedside chair)?: A Little ?Help needed to walk in hospital room?: A Lot ?Help needed climbing 3-5 steps with a railing? : A Lot ?6 Click Score: 14 ? ?  ?End of Session Equipment Utilized During Treatment: Gait belt ?Activity Tolerance: Patient tolerated treatment well ?Patient left: in chair;with call [place]/phone within reach;with chair alarm set ?Nurse Communication: Mobility status (bed was soaking wet, gown as well. changed gown and stripped bed) ?PT Visit Diagnosis: Unsteadiness on feet (R26.81);Muscle weakness (generalized) (M62.81);Difficulty in walking, not elsewhere classified (R26.2) ?  ? ? ?Time: 2542-7062 ?PT Time Calculation (min) (ACUTE ONLY): 19 min ? ?Charges:  $Therapeutic Activity: 8-22 mins          ?          ? ?Amanda Cockayne, PT, GCS ?09/08/21,2:56 PM ? ? ?

## 2021-09-09 DIAGNOSIS — J9601 Acute respiratory failure with hypoxia: Secondary | ICD-10-CM | POA: Diagnosis not present

## 2021-09-09 NOTE — Plan of Care (Signed)
  Problem: Health Behavior/Discharge Planning: Goal: Ability to manage health-related needs will improve Outcome: Progressing   Problem: Clinical Measurements: Goal: Ability to maintain clinical measurements within normal limits will improve Outcome: Progressing   

## 2021-09-09 NOTE — Progress Notes (Addendum)
?PROGRESS NOTE ? ?Angela Leonard KDX:[id number] DOB: 30-Jun-1934 DOA: 08/30/2021 ?PCP: [facility] [place] ? ? LOS: 9 days  ? ?Brief Narrative / Interim history: ?[name] is an 87 year old female with history of  recent COVID infection 1 month ago, rheumatoid arthritis seropositive on methotrexate, insomnia, CKD stage IIIa, osteoarthritis, history squamous cell carcinoma of skin, hypertension, atrial fibrillation on Eliquis, hyperlipidemia, depression, who presented to the ED on 08/30/2021 with shortness of breath with oxygen desaturation to 88% at home, multiple falls at home in the past week. UA consistent with infection, and completed a course of Rocephin.  Hypoxia resolved, she is improved and currently awaiting SNF which is planned for Monday 4/17 ? ?Subjective / 24h Interval events: ?No complaints.  Self removed bandage on her forehead lesion and picks at it ? ?Assesement and Plan: ?Principal Problem: ?  Acute respiratory failure with hypoxia (HCC) ?Active Problems: ?  Arthritis of right hip ?  Acute lower UTI ?  Leukocytosis ?  Chronic atrial fibrillation, unspecified ([place]) ?  HTN (hypertension) ?  HLD (hyperlipidemia) ?  RA (rheumatoid arthritis) ([place]) ?  Dementia without behavioral disturbance ([place]) ?  Shortness of breath ?  History of COVID-19 ?  Skin cancer ?  Recurrent falls ?  Generalized weakness ?  Hypomagnesemia ?  Protein-calorie malnutrition, severe ? ? ?Assessment and Plan: ?Principal problem ?Acute respiratory failure with hypoxia (HCC) -POA, resolved.  Sats stable on room air. ?Patient noted to have oxygen desaturation to 88% on room air at home, reporting couple months history of shortness of breath.  CTA chest was negative for PE.  Lower extremity Doppler ultrasound negative for DVT bilaterally. CTA chest did show a focal mass in the left lower lobe up to 3.1 cm max diameter. 71-month follow-up CT recommended if high risk or if findings would change management. Discussed with daughter,  she indicated if this were felt to be malignancy they would not likely pursue aggressive treatment. ? ?Arthritis of right hip -Analgesia PRN. ?Acute lower UTI -completed Rocephin ?Leukocytosis -Initial WBC 10.7, improved to 9.3. Suspected due to UTI. ?Chronic atrial fibrillation, unspecified ([place]) -continue Eliquis ?Hypomagnesemia -monitor and replete ?Generalized weakness -With multiple falls at home in the week prior to admission.  PT/OT recommend SNF, daughter agreeable.  Plan to go on 4/17 ?Recurrent falls -With baseline frailty, cognitive deficits, Covid infection last month.  See generalized weakness. ?Skin cancer -Status post excision at dermatology office and is currently on 5-FU cream daily -continue.  Patient keeps removing the dressing on her forehead and picking at her removal site ?History of COVID-19-Recent history of COVID-19 infection in March 2023.  Still testing positive this admission.  Asymptomatic other than the shortness of breath she mentioned on admission.   ?Shortness of breath -Likely in part due to recent COVID infection, also has a mass seen on CT in her left lower lobe which may contribute. Improved. ?Dementia without behavioral disturbance ([place]) -Delirium precautions ?RA (rheumatoid arthritis) (HCC) -Continue home methotrexate  ?HLD (hyperlipidemia) -Not on statin ?HTN (hypertension) -continue diltiazem ? ?Scheduled Meds: ? (feeding supplement) PROSource Plus  30 mL Oral BID BM  ? apixaban  5 mg Oral BID  ? cholecalciferol  1,000 Units Oral Daily  ? diltiazem  180 mg Oral Daily  ? feeding supplement  237 mL Oral TID BM  ? ferrous sulfate  325 mg Oral Q breakfast  ? fluorouracil   Topical BID  ? folic acid  1 mg Oral Daily  ? methotrexate  17.5 mg Oral Q Tue  ? multivitamin-lutein  1 capsule Oral BID  ? ?Continuous Infusions: ?PRN Meds:.HYDROcodone-acetaminophen, ondansetron **OR** ondansetron (ZOFRAN) IV, traZODone ? ?Diet Orders (From admission, onward)  ? ?  Start     Ordered  ?  08/31/21 1214  Diet regular Room service appropriate? Yes; Fluid consistency: Thin  Diet effective now       ?Question Answer Comment  ?Room service appropriate? Yes   ?Fluid consistency: Thin   ?  ? 08/31/21 1214  ? ?  ?  ? ?  ? ? ?DVT prophylaxis: Place TED hose Start: 08/30/21 1836 ?apixaban (ELIQUIS) tablet 5 mg  ? ?Lab Results  ?Component Value Date  ? PLT 244 09/08/2021  ? ? ?  Code Status: Full Code ? ?Family Communication: no family at bedside  ? ?Status is: Inpatient ? ?Remains inpatient appropriate because: Awaiting placement, plan for Monday 4/17 ? ?Level of care: Med-Surg ? ?Consultants:  ?none ? ?Procedures:  ?none ? ?Microbiology  ?none ? ?Antimicrobials: ?none  ? ? ?Objective: ?Vitals:  ? 09/08/21 1613 09/08/21 2121 09/09/21 0258 09/09/21 0955  ?BP: 109/62 (!) 112/57 105/66 124/72  ?Pulse: 77 77 92 100  ?Resp: 17 18 17 16   ?Temp: 97.9 ?F (36.6 ?C) 97.9 ?F (36.6 ?C) 98 ?F (36.7 ?C) 98 ?F (36.7 ?C)  ?TempSrc:  Oral Oral   ?SpO2: 93% 97% 93% 94%  ?Weight:   51.6 kg   ?Height:      ? ? ?Intake/Output Summary (Last 24 hours) at 09/09/2021 1007 ?Last data filed at 09/09/2021 0000 ?Gross per 24 hour  ?Intake 200 ml  ?Output --  ?Net 200 ml  ? ? ?Wt Readings from Last 3 Encounters:  ?09/09/21 51.6 kg  ?12/27/20 74 kg  ?12/19/20 74.4 kg  ? ? ?Examination: ? ?Constitutional: nad ?Respiratory: cta ?Cardiovascular: rrr ? ?Data Reviewed: I have independently reviewed following labs and imaging studies ? ?CBC ?Recent Labs  ?Lab 09/05/21 ?0420 09/08/21 ?6389  ?WBC 10.8* 10.9*  ?HGB 13.9 13.6  ?HCT 41.8 40.7  ?PLT 241 244  ?MCV 103.5* 102.0*  ?MCH 34.4* 34.1*  ?MCHC 33.3 33.4  ?RDW 13.5 13.7  ? ? ? ?Recent Labs  ?Lab 09/08/21 ?0629 09/08/21 ?3734  ?NA  --  131*  ?K  --  4.3  ?CL  --  97*  ?CO2  --  25  ?GLUCOSE  --  91  ?BUN  --  27*  ?CREATININE 0.81 0.92  ?CALCIUM  --  8.9  ? ? ? ?------------------------------------------------------------------------------------------------------------------ ?No results for  input(s): CHOL, HDL, LDLCALC, TRIG, CHOLHDL, LDLDIRECT in the last 72 hours. ? ?No results found for: HGBA1C ?------------------------------------------------------------------------------------------------------------------ ?No results for input(s): TSH, T4TOTAL, T3FREE, THYROIDAB in the last 72 hours. ? ?Invalid input(s): FREET3 ? ?Cardiac Enzymes ?No results for input(s): CKMB, TROPONINI, MYOGLOBIN in the last 168 hours. ? ?Invalid input(s): CK ?------------------------------------------------------------------------------------------------------------------ ?   ?Component Value Date/Time  ? BNP 129.9 (H) 08/30/2021 1448  ? ? ?CBG: ?No results for input(s): GLUCAP in the last 168 hours. ? ? ?Recent Results (from the past 240 hour(s))  ?Urine Culture     Status: Abnormal  ? Collection Time: 08/30/21  6:23 PM  ? Specimen: Urine, Random  ?Result Value Ref Range Status  ? Specimen Description   Final  ?  URINE, RANDOM ?Performed at [facility], [address], [place], [place] [address] ?  ? Special Requests   Final  ?  NONE ?Performed at [facility], [place]  [address], [place], [place] [address] ?  ? Culture MULTIPLE SPECIES PRESENT, SUGGEST RECOLLECTION (A)  Final  ? Report Status 09/01/2021 FINAL  Final  ?Resp Panel by RT-PCR (Flu A&B, Covid) Nasopharyngeal Swab     Status: Abnormal  ? Collection Time: 08/30/21  7:35 PM  ? Specimen: Nasopharyngeal Swab; Nasopharyngeal(NP) swabs in vial transport medium  ?Result Value Ref Range Status  ? SARS Coronavirus 2 by RT PCR POSITIVE (A) NEGATIVE Final  ?  Comment: (NOTE) ?SARS-CoV-2 target nucleic acids are DETECTED. ? ?The SARS-CoV-2 RNA is generally detectable in upper respiratory ?specimens during the acute phase of infection. Positive results are ?indicative of the presence of the identified virus, but do not rule ?out bacterial infection or co-infection with other pathogens not ?detected by the test. Clinical correlation with patient history  and ?other diagnostic information is necessary to determine patient ?infection status. The expected result is Negative. ? ?Fact Sheet for Patients: ?EntrepreneurPulse.com.au ? ?Fact Sheet for Healthcare

## 2021-09-10 DIAGNOSIS — N390 Urinary tract infection, site not specified: Secondary | ICD-10-CM

## 2021-09-10 DIAGNOSIS — J9601 Acute respiratory failure with hypoxia: Secondary | ICD-10-CM | POA: Diagnosis not present

## 2021-09-10 MED ORDER — APIXABAN 5 MG PO TABS
5.0000 mg | ORAL_TABLET | Freq: Two times a day (BID) | ORAL | Status: AC
Start: 2021-09-10 — End: ?

## 2021-09-10 MED ORDER — HYDROCODONE-ACETAMINOPHEN 7.5-325 MG PO TABS: 1.0000 | ORAL_TABLET | Freq: Four times a day (QID) | ORAL | 0 refills | Status: DC | PRN

## 2021-09-10 NOTE — TOC Progression Note (Signed)
Transition of Care (TOC) - Progression Note  ? ? ?Patient Details  ?Name: Sarahmarie Leavey ?MRN: [id number] ?Date of Birth: 04-10-1935 ? ?Transition of Care (TOC) CM/SW Contact  ?Pete Pelt, RN ?Phone Number: ?09/10/2021, 10:14 AM ? ?Clinical Narrative:  Patient's daughter was initially uncertain about transfer, but agreed to [facility].  Family asked for no sugar in her diet, MD added this to discharge summary.  Patient will go to [facility] today via EMS ? ? ? ?  ?  ? ?Expected Discharge Plan and Services ?  ?  ?  ?  ?  ?Expected Discharge Date: 09/10/21               ?  ?  ?  ?  ?  ?  ?  ?  ?  ?  ? ? ?Social Determinants of Health (SDOH) Interventions ?  ? ?Readmission Risk Interventions ? ?  08/13/2020  ?  1:17 PM  ?Readmission Risk Prevention Plan  ?Transportation Screening Complete  ?PCP or Specialist Appt within 3-5 Days Complete  ?[place] or Home Care Consult Complete  ?Social Work Consult for [place] Planning/Counseling Complete  ?Palliative Care Screening Not Applicable  ?Medication Review Press photographer) Complete  ? ? ?

## 2021-09-10 NOTE — Discharge Summary (Addendum)
? ?Physician Discharge Summary  ?Angela Leonard SJG:[id number] DOB: 24-Oct-1934 DOA: 08/30/2021 ? ?PCP: [facility] [place] ? ?Admit date: 08/30/2021 ?Discharge date: 09/10/2021 ? ?Admitted From: home ?Disposition:  SNF ? ?Recommendations for Outpatient Follow-up:  ?Follow up with PCP in 1-2 weeks ?Please obtain BMP/CBC in one week ?Please NO SUGAR in her diet ? ?Home Health: none ?Equipment/Devices: none ? ?Discharge Condition: stable ?CODE STATUS: Full code ?Diet recommendation: regular ? ?HPI: Per admitting MD, ?[name] is an 87 year old female with history of rheumatoid arthritis seropositive on methotrexate, insomnia, CKD stage IIIa, osteoarthritis, hypertension, atrial fibrillation on Eliquis, hyperlipidemia, depression, who presents emergency department for chief concerns of shortness of breath. Initial vitals in the emergency department showed temperature of 97.5, respiration rate of 23, heart rate 92, blood pressure 110/79, SPO2 of 93% on room air. Serum sodium 135, potassium 3.6, chloride of 102, bicarb of 20, BUN of 27, serum creatinine of 0.97, GFR 58, nonfasting blood glucose 117. WBC 10.7, hemoglobin 15.7, platelets of 267. UA has been ordered and pending collection.  High sensitive troponin was 14.  Lactic acid was 1.2. At bedside she was able to tell me her full name, states her age is 87, states that she is at home, and the current year is 2003. She denies being in any pain, she denies dysuria, hematuria, chest pain.  She states she has been having difficulty breathing for several months now. Daughter states that patient fell about three times in the last week. Patient has been having labored breathing and care giver advised family to check her spO2 both at rest and with ambulation. At rest, her SpO2 was 93% and ambulation to and from the restroom, 88% on room air. Daughter reports this has never happened before. Daughter reports increase urination in last week. Daughter reports that since  falling on Thursday, she fell on her hip, she has been as active as normal and increased laying int he bed.  ? ?Hospital Course / Discharge diagnoses: ?Principal Problem: ?  Acute respiratory failure with hypoxia ([place]) ?Active Problems: ?  Arthritis of right hip ?  Acute lower UTI ?  Leukocytosis ?  Chronic atrial fibrillation, unspecified ([place]) ?  HTN (hypertension) ?  HLD (hyperlipidemia) ?  RA (rheumatoid arthritis) ([place]) ?  Dementia without behavioral disturbance ([place]) ?  Shortness of breath ?  History of COVID-19 ?  Skin cancer ?  Recurrent falls ?  Generalized weakness ?  Hypomagnesemia ?  Protein-calorie malnutrition, severe ? ? ?Assessment and Plan: ?Principal problem ?Acute respiratory failure with hypoxia (HCC) -POA, resolved.  Sats stable on room air.Patient noted to have oxygen desaturation to 88% on room air at home, reporting couple months history of shortness of breath.  CTA chest was negative for PE.  Lower extremity Doppler ultrasound negative for DVT bilaterally. CTA chest did show a focal mass in the left lower lobe up to 3.1 cm max diameter. 22-month follow-up CT recommended if high risk or if findings would change management. Discussed with daughter, she indicated if this were felt to be malignancy they would not likely pursue aggressive treatment. ?  ?Arthritis of right hip -Analgesia PRN. ?Acute lower UTI -completed Rocephin ?Leukocytosis -Suspected due to UTI. ?Chronic atrial fibrillation, unspecified ([place]) -continue Eliquis, diltiazem ?Hypomagnesemia -monitor and replete ?Generalized weakness -With multiple falls at home in the week prior to admission.  PT/OT recommend SNF ?Recurrent falls -With baseline frailty, cognitive deficits, Covid infection last month.  See generalized weakness. ?Skin cancer -Status post  excision at dermatology office and is currently on 5-FU cream daily -continue. Outpatient follow up ?History of COVID-19-Recent history of COVID-19 infection in March 2023.  Still  testing positive this admission.  Asymptomatic other than the shortness of breath she mentioned on admission.   ?Shortness of breath -Likely in part due to recent COVID infection, also has a mass seen on CT in her left lower lobe which may contribute. Improved. ?Dementia without behavioral disturbance ([place]) -Delirium precautions ?RA (rheumatoid arthritis) (HCC) -Continue home methotrexate  ?HLD (hyperlipidemia) -Not on statin ?HTN (hypertension) -continue diltiazem ? ? ?Sepsis ruled out ? ? ?Discharge Instructions ? ? ?Allergies as of 09/10/2021   ?No Known Allergies ?  ? ?  ?Medication List  ?  ? ?STOP taking these medications   ? ?mirtazapine 15 MG tablet ?Commonly known as: REMERON ?  ?ramipril 5 MG capsule ?Commonly known as: ALTACE ?  ? ?  ? ?TAKE these medications   ? ?acetaminophen 325 MG tablet ?Commonly known as: TYLENOL ?Take 2 tablets (650 mg total) by mouth every 6 (six) hours as needed for mild pain or moderate pain (pain score 1-3 or temp > 100.5). ?  ?apixaban 5 MG Tabs tablet ?Commonly known as: ELIQUIS ?Take 1 tablet (5 mg total) by mouth 2 (two) times daily. ?What changed: when to take this ?  ?cholecalciferol 1000 units tablet ?Commonly known as: VITAMIN D ?Take 1,000 Units by mouth daily. ?  ?diltiazem 180 MG 24 hr capsule ?Commonly known as: TIAZAC ?Take 180 mg by mouth daily. ?  ?ferrous sulfate 325 (65 FE) MG tablet ?Take 1 tablet (325 mg total) by mouth daily with breakfast. ?  ?fluorouracil 5 % cream ?Commonly known as: EFUDEX ?Apply topically 2 (two) times daily. ?  ?folic acid 1 MG tablet ?Commonly known as: FOLVITE ?Take 1 mg by mouth daily. ?  ?HYDROcodone-acetaminophen 7.5-325 MG tablet ?Commonly known as: NORCO ?Take 1 tablet by mouth every 6 (six) hours as needed for severe pain (pain score 7-10). ?  ?methotrexate 2.5 MG tablet ?Commonly known as: RHEUMATREX ?Take 7 tablets by mouth every Tuesday. ?  ?multivitamin-lutein Caps capsule ?Take 1 capsule by mouth 2 (two) times daily. ?   ?traZODone 50 MG tablet ?Commonly known as: DESYREL ?Take 0.5 tablets (25 mg total) by mouth at bedtime as needed for sleep. ?  ?vitamin B-12 1000 MCG tablet ?Commonly known as: CYANOCOBALAMIN ?Take 1,000 mcg by mouth daily. ?  ? ?  ? ? Contact information for after-discharge care   ? ? Destination   ? ? [facility] .   ?Service: Skilled Nursing ?Contact information: ?[address] ?[place] ?[phone] ? ?  ?  ? ?  ?  ? ?  ?  ? ?  ? ? ?Consultations: ?none ? ?Procedures/Studies: ? ?DG Pelvis 1-2 Views ? ?Result Date: 08/30/2021 ?CLINICAL DATA:  Pelvic pain after fall. EXAM: PELVIS - 1-2 VIEW COMPARISON:  None. FINDINGS: There is no evidence of acute pelvic fracture or diastasis. No pelvic bone lesions are seen. Status post surgical fixation of old proximal left femoral fracture. Moderate degenerative joint disease of right hip is noted. IMPRESSION: No acute abnormality seen in the pelvis. Electronically Signed   By: Marijo Conception M.D.   On: 08/30/2021 16:24  ? ?CT HEAD WO CONTRAST (5MM) ? ?Addendum Date: 08/30/2021   ?ADDENDUM REPORT: 08/30/2021 16:18 ADDENDUM: With additional review, old fractures of the  RIGHT vertebral arch of C4 are identified. These appear corticated and are unchanged from the previous exam. No new fractures identified. Findings called to Ashok Cordia PA on 08/30/2021 at 1617 hours. Electronically Signed   By: Lavonia Dana M.D.   On: 08/30/2021 16:18  ? ?Result Date: 08/30/2021 ?CLINICAL DATA:  Neck trauma, desaturation with activity, had COVID-19 1 month ago, history hypertension, atrial fibrillation, dementia EXAM: CT HEAD WITHOUT CONTRAST CT CERVICAL SPINE WITHOUT CONTRAST TECHNIQUE: Multidetector CT imaging of the head and cervical spine was performed following the standard protocol without intravenous contrast. Multiplanar CT image reconstructions of the cervical spine were  also generated. RADIATION DOSE REDUCTION: This exam was performed according to the departmental dose-optimization program which includes automated exposure control, adjustment of the mA and/or kV according to pa

## 2021-09-10 NOTE — Care Management Important Message (Signed)
Important Message ? ?Patient Details  ?Name: Angela Leonard ?MRN: [id number] ?Date of Birth: [birth year] ? ? ?Medicare Important Message Given:  Yes ? ? ? ? ?Dannette Barbara ?09/10/2021, 1:05 PM ?

## 2021-11-05 ENCOUNTER — Emergency Department: Payer: Medicare Other

## 2021-11-05 ENCOUNTER — Other Ambulatory Visit: Payer: Self-pay

## 2021-11-05 ENCOUNTER — Encounter: Payer: Self-pay | Admitting: Emergency Medicine

## 2021-11-05 ENCOUNTER — Inpatient Hospital Stay
Admission: EM | Admit: 2021-11-05 | Discharge: 2021-11-08 | DRG: 871 | Disposition: A | Discharge status: Home Health | Payer: Medicare Other | Attending: Internal Medicine | Admitting: Internal Medicine

## 2021-11-05 DIAGNOSIS — I10 Essential (primary) hypertension: Secondary | ICD-10-CM | POA: Diagnosis present

## 2021-11-05 DIAGNOSIS — Z7901 Long term (current) use of anticoagulants: Secondary | ICD-10-CM

## 2021-11-05 DIAGNOSIS — E785 Hyperlipidemia, unspecified: Secondary | ICD-10-CM | POA: Diagnosis present

## 2021-11-05 DIAGNOSIS — F0390 Unspecified dementia without behavioral disturbance: Secondary | ICD-10-CM | POA: Diagnosis present

## 2021-11-05 DIAGNOSIS — Z8249 Family history of ischemic heart disease and other diseases of the circulatory system: Secondary | ICD-10-CM | POA: Diagnosis not present

## 2021-11-05 DIAGNOSIS — C787 Secondary malignant neoplasm of liver and intrahepatic bile duct: Secondary | ICD-10-CM | POA: Diagnosis present

## 2021-11-05 DIAGNOSIS — Z809 Family history of malignant neoplasm, unspecified: Secondary | ICD-10-CM | POA: Diagnosis not present

## 2021-11-05 DIAGNOSIS — E43 Unspecified severe protein-calorie malnutrition: Secondary | ICD-10-CM | POA: Diagnosis present

## 2021-11-05 DIAGNOSIS — M069 Rheumatoid arthritis, unspecified: Secondary | ICD-10-CM | POA: Diagnosis present

## 2021-11-05 DIAGNOSIS — Z515 Encounter for palliative care: Secondary | ICD-10-CM | POA: Diagnosis not present

## 2021-11-05 DIAGNOSIS — J9601 Acute respiratory failure with hypoxia: Secondary | ICD-10-CM | POA: Diagnosis present

## 2021-11-05 DIAGNOSIS — A419 Sepsis, unspecified organism: Secondary | ICD-10-CM | POA: Diagnosis present

## 2021-11-05 DIAGNOSIS — E8809 Other disorders of plasma-protein metabolism, not elsewhere classified: Secondary | ICD-10-CM | POA: Diagnosis present

## 2021-11-05 DIAGNOSIS — J8410 Pulmonary fibrosis, unspecified: Secondary | ICD-10-CM | POA: Diagnosis present

## 2021-11-05 DIAGNOSIS — E860 Dehydration: Secondary | ICD-10-CM | POA: Diagnosis present

## 2021-11-05 DIAGNOSIS — I480 Paroxysmal atrial fibrillation: Secondary | ICD-10-CM | POA: Diagnosis present

## 2021-11-05 DIAGNOSIS — J189 Pneumonia, unspecified organism: Secondary | ICD-10-CM | POA: Diagnosis present

## 2021-11-05 DIAGNOSIS — Z79631 Long term (current) use of antimetabolite agent: Secondary | ICD-10-CM

## 2021-11-05 DIAGNOSIS — Z66 Do not resuscitate: Secondary | ICD-10-CM | POA: Diagnosis present

## 2021-11-05 DIAGNOSIS — Z79899 Other long term (current) drug therapy: Secondary | ICD-10-CM | POA: Diagnosis not present

## 2021-11-05 DIAGNOSIS — Z87891 Personal history of nicotine dependence: Secondary | ICD-10-CM

## 2021-11-05 DIAGNOSIS — Z681 Body mass index (BMI) 19 or less, adult: Secondary | ICD-10-CM

## 2021-11-05 DIAGNOSIS — Z20822 Contact with and (suspected) exposure to covid-19: Secondary | ICD-10-CM | POA: Diagnosis present

## 2021-11-05 DIAGNOSIS — C3432 Malignant neoplasm of lower lobe, left bronchus or lung: Secondary | ICD-10-CM | POA: Diagnosis present

## 2021-11-05 DIAGNOSIS — Z820 Family history of epilepsy and other diseases of the nervous system: Secondary | ICD-10-CM

## 2021-11-05 DIAGNOSIS — R1310 Dysphagia, unspecified: Secondary | ICD-10-CM | POA: Diagnosis present

## 2021-11-05 LAB — BLOOD GAS, VENOUS
Acid-Base Excess: 4 mmol/L — ABNORMAL HIGH (ref 0.0–2.0)
Bicarbonate: 29.8 mmol/L — ABNORMAL HIGH (ref 20.0–28.0)
O2 Saturation: 52.3 %
Patient temperature: 37
pCO2, Ven: 47 mmHg (ref 44–60)
pH, Ven: 7.41 (ref 7.25–7.43)
pO2, Ven: 33 mmHg (ref 32–45)

## 2021-11-05 LAB — CBC WITH DIFFERENTIAL/PLATELET
Abs Immature Granulocytes: 0.09 10*3/uL — ABNORMAL HIGH (ref 0.00–0.07)
Basophils Absolute: 0 10*3/uL (ref 0.0–0.1)
Basophils Relative: 0 %
Eosinophils Absolute: 0.1 10*3/uL (ref 0.0–0.5)
Eosinophils Relative: 1 %
HCT: 41.4 % (ref 36.0–46.0)
Hemoglobin: 13.1 g/dL (ref 12.0–15.0)
Immature Granulocytes: 1 %
Lymphocytes Relative: 5 %
Lymphs Abs: 0.9 10*3/uL (ref 0.7–4.0)
MCH: 33 pg (ref 26.0–34.0)
MCHC: 31.6 g/dL (ref 30.0–36.0)
MCV: 104.3 fL — ABNORMAL HIGH (ref 80.0–100.0)
Monocytes Absolute: 1.1 10*3/uL — ABNORMAL HIGH (ref 0.1–1.0)
Monocytes Relative: 6 %
Neutro Abs: 15.5 10*3/uL — ABNORMAL HIGH (ref 1.7–7.7)
Neutrophils Relative %: 87 %
Platelets: 181 10*3/uL (ref 150–400)
RBC: 3.97 MIL/uL (ref 3.87–5.11)
RDW: 16.3 % — ABNORMAL HIGH (ref 11.5–15.5)
WBC: 17.7 10*3/uL — ABNORMAL HIGH (ref 4.0–10.5)
nRBC: 0.1 % (ref 0.0–0.2)

## 2021-11-05 LAB — COMPREHENSIVE METABOLIC PANEL
ALT: 19 U/L (ref 0–44)
AST: 48 U/L — ABNORMAL HIGH (ref 15–41)
Albumin: 2.1 g/dL — ABNORMAL LOW (ref 3.5–5.0)
Alkaline Phosphatase: 104 U/L (ref 38–126)
Anion gap: 5 (ref 5–15)
BUN: 34 mg/dL — ABNORMAL HIGH (ref 8–23)
CO2: 26 mmol/L (ref 22–32)
Calcium: 8.9 mg/dL (ref 8.9–10.3)
Chloride: 111 mmol/L (ref 98–111)
Creatinine, Ser: 0.83 mg/dL (ref 0.44–1.00)
GFR, Estimated: >60 mL/min (ref >60)
Glucose, Bld: 110 mg/dL — ABNORMAL HIGH (ref 70–99)
Potassium: 3.6 mmol/L (ref 3.5–5.1)
Sodium: 142 mmol/L (ref 135–145)
Total Bilirubin: 1.2 mg/dL (ref 0.3–1.2)
Total Protein: 7.5 g/dL (ref 6.5–8.1)

## 2021-11-05 LAB — URINALYSIS, ROUTINE W REFLEX MICROSCOPIC
Bilirubin Urine: NEGATIVE
Glucose, UA: NEGATIVE mg/dL
Ketones, ur: NEGATIVE mg/dL
Leukocytes,Ua: NEGATIVE
Nitrite: POSITIVE — AB
Protein, ur: 30 mg/dL — AB
Specific Gravity, Urine: 1.027 (ref 1.005–1.030)
pH: 5 (ref 5.0–8.0)

## 2021-11-05 LAB — LACTIC ACID, PLASMA
Lactic Acid, Venous: 1.4 mmol/L (ref 0.5–1.9)
Lactic Acid, Venous: 1.4 mmol/L (ref 0.5–1.9)

## 2021-11-05 LAB — TROPONIN I (HIGH SENSITIVITY)
Troponin I (High Sensitivity): 25 ng/L — ABNORMAL HIGH (ref <18)
Troponin I (High Sensitivity): 18 ng/L — ABNORMAL HIGH (ref <18)

## 2021-11-05 LAB — PROCALCITONIN: Procalcitonin: 4.38 ng/mL

## 2021-11-05 LAB — SARS CORONAVIRUS 2 BY RT PCR: SARS Coronavirus 2 by RT PCR: NEGATIVE

## 2021-11-05 LAB — BRAIN NATRIURETIC PEPTIDE: B Natriuretic Peptide: 440.2 pg/mL — ABNORMAL HIGH (ref 0.0–100.0)

## 2021-11-05 MED ORDER — SODIUM CHLORIDE 0.9 % IV SOLN: INTRAVENOUS | Status: AC

## 2021-11-05 MED ORDER — SODIUM CHLORIDE 0.9 % IV SOLN
500.0000 mg | INTRAVENOUS | Status: DC
  Administered 2021-11-06 – 2021-11-07 (×2): 500 mg via INTRAVENOUS
  Filled 2021-11-05: qty 5
  Filled 2021-11-05: qty 500
  Filled 2021-11-05: qty 5

## 2021-11-05 MED ORDER — SODIUM CHLORIDE 0.9 % IV BOLUS
1000.0000 mL | Freq: Once | INTRAVENOUS | Status: AC
  Administered 2021-11-05: 1000 mL via INTRAVENOUS

## 2021-11-05 MED ORDER — SODIUM CHLORIDE 0.9 % IV SOLN
500.0000 mg | Freq: Once | INTRAVENOUS | Status: AC
  Administered 2021-11-05: 500 mg via INTRAVENOUS
  Filled 2021-11-05: qty 5

## 2021-11-05 MED ORDER — SODIUM CHLORIDE 0.9 % IV SOLN
2.0000 g | Freq: Once | INTRAVENOUS | Status: AC
  Administered 2021-11-05: 2 g via INTRAVENOUS
  Filled 2021-11-05: qty 20

## 2021-11-05 MED ORDER — IOHEXOL 300 MG/ML  SOLN
75.0000 mL | Freq: Once | INTRAMUSCULAR | Status: AC | PRN
  Administered 2021-11-05: 75 mL via INTRAVENOUS

## 2021-11-05 MED ORDER — APIXABAN 2.5 MG PO TABS
2.5000 mg | ORAL_TABLET | Freq: Two times a day (BID) | ORAL | Status: DC
  Administered 2021-11-06 – 2021-11-08 (×5): 2.5 mg via ORAL
  Filled 2021-11-05 (×5): qty 1

## 2021-11-05 MED ORDER — SODIUM CHLORIDE 0.9 % IV SOLN
2.0000 g | INTRAVENOUS | Status: DC
  Administered 2021-11-06 – 2021-11-07 (×2): 2 g via INTRAVENOUS
  Filled 2021-11-05: qty 2
  Filled 2021-11-05: qty 20
  Filled 2021-11-05: qty 2

## 2021-11-05 NOTE — ED Notes (Signed)
Patient transported to PCU, room 238 on monitor with RN, stable at transfer

## 2021-11-05 NOTE — ED Provider Notes (Signed)
[facility] Provider Note    Event Date/Time   First MD Initiated Contact with Patient 11/05/21 1106     (approximate)   History   Shortness of Breath   HPI  Angela Leonard is a 87 y.o. female past medical history of RA on methotrexate, A-fib on Eliquis hyperlipidemia CKD depression who presents with shortness of breath.  Patient presents from home.  Apparently she was found and was quite dyspneic.  She was satting in the mid 70s for EMS.  On arrival she is satting 88% on room air.  Patient tells me she has been feeling fatigued with some difficulty breathing.  History is somewhat limited as patient is somewhat somnolent.  Does endorse chest pain "all over" denies cough fevers chills.  No nausea vomiting or abdominal pain.  No lower extremity swelling     Past Medical History:  Diagnosis Date   HLD (hyperlipidemia)    Hypertension     Patient Active Problem List   Diagnosis Date Noted   Protein-calorie malnutrition, severe 09/04/2021   Recurrent falls 09/01/2021   Generalized weakness 09/01/2021   Hypomagnesemia 09/01/2021   Shortness of breath 08/30/2021   Acute respiratory failure with hypoxia ([place]) 08/30/2021   History of COVID-19 08/30/2021   Pyuria 08/30/2021   Skin cancer 08/30/2021   Leukocytosis 08/30/2021   Right hip pain 11/29/2020   Atrial fibrillation with RVR (HCC)    Hypotension    Mass of skin of head    Dementia without behavioral disturbance ([place])    Closed hip fracture requiring operative repair, left, sequela 08/11/2020   Dehydration 08/25/2019   Acute lower UTI 08/24/2019   Fall at home, initial encounter 08/24/2019   Hypokalemia 08/24/2019   RA (rheumatoid arthritis) ([place]) 08/24/2019   Fall 08/24/2019   Facial laceration    Chronic atrial fibrillation, unspecified ([place]) 02/19/2019   Sepsis ([place]) 08/03/2018   Atypical pneumonia 08/03/2018   Arthritis of right hip 08/03/2018   HTN (hypertension) 08/03/2018   HLD  (hyperlipidemia) 08/03/2018   AKI (acute kidney injury) ([place]) 09/15/2017   Symptomatic cholelithiasis 10/13/2014     Physical Exam  Triage Vital Signs: ED Triage Vitals [11/05/21 1113]  Enc Vitals Group     BP 108/81     Pulse Rate 89     Resp (!) 33     Temp 97.6 F (36.4 C)     Temp Source Oral     SpO2 (!) 88 %     Weight      Height      Head Circumference      Peak Flow      Pain Score      Pain Loc      Pain Edu?      Excl. in [place]?     Most recent vital signs: Vitals:   11/05/21 1400 11/05/21 1430  BP: 99/63 98/65  Pulse: (!) 106 90  Resp: 19 (!) 24  Temp:    SpO2: 97% 97%     General: Awake, no distress.  CV:  Good peripheral perfusion.  No peripheral edema Resp:  Normal effort.  No increased work of breathing, crackles primarily at the right base Abd:  No distention.  Mild tenderness throughout soft Neuro:             Awake, Alert, patient intermittently following commands and able to provide history but then seems to stare off and requires frequent redirection moves all extremities Other:  ED Results / Procedures / Treatments  Labs (all labs ordered are listed, but only abnormal results are displayed) Labs Reviewed  COMPREHENSIVE METABOLIC PANEL - Abnormal; Notable for the following components:      Result Value   Glucose, Bld 110 (*)    BUN 34 (*)    Albumin 2.1 (*)    AST 48 (*)    All other components within normal limits  CBC WITH DIFFERENTIAL/PLATELET - Abnormal; Notable for the following components:   WBC 17.7 (*)    MCV 104.3 (*)    RDW 16.3 (*)    Neutro Abs 15.5 (*)    Monocytes Absolute 1.1 (*)    Abs Immature Granulocytes 0.09 (*)    All other components within normal limits  BRAIN NATRIURETIC PEPTIDE - Abnormal; Notable for the following components:   B Natriuretic Peptide 440.2 (*)    All other components within normal limits  BLOOD GAS, VENOUS - Abnormal; Notable for the following components:   Bicarbonate 29.8 (*)     Acid-Base Excess 4.0 (*)    All other components within normal limits  URINALYSIS, ROUTINE W REFLEX MICROSCOPIC - Abnormal; Notable for the following components:   Color, Urine AMBER (*)    APPearance HAZY (*)    Hgb urine dipstick SMALL (*)    Protein, ur 30 (*)    Nitrite POSITIVE (*)    Bacteria, UA MANY (*)    All other components within normal limits  TROPONIN I (HIGH SENSITIVITY) - Abnormal; Notable for the following components:   Troponin I (High Sensitivity) 25 (*)    All other components within normal limits  CULTURE, BLOOD (ROUTINE X 2)  CULTURE, BLOOD (ROUTINE X 2)  SARS CORONAVIRUS 2 BY RT PCR  LACTIC ACID, PLASMA  PROCALCITONIN  LACTIC ACID, PLASMA  TROPONIN I (HIGH SENSITIVITY)     EKG  EKG reviewed and interpreted by myself shows atrial fibrillation that is rate controlled there is right axis deviation poor R wave progression low voltage   RADIOLOGY    PROCEDURES:  Critical Care performed: Yes, see critical care procedure note(s)  .1-3 Lead EKG Interpretation  Performed by: Rada Hay, MD Authorized by: Rada Hay, MD     Interpretation: abnormal     ECG rate assessment: normal     Rhythm: atrial fibrillation     Ectopy: none     Conduction: normal     The patient is on the cardiac monitor to evaluate for evidence of arrhythmia and/or significant heart rate changes.   MEDICATIONS ORDERED IN ED: Medications  cefTRIAXone (ROCEPHIN) 2 g in sodium chloride 0.9 % 100 mL IVPB (2 g Intravenous New Bag/Given 11/05/21 1440)  azithromycin (ZITHROMAX) 500 mg in sodium chloride 0.9 % 250 mL IVPB (has no administration in time range)  sodium chloride 0.9 % bolus 1,000 mL (0 mLs Intravenous Stopped 11/05/21 1439)  iohexol (OMNIPAQUE) 300 MG/ML solution 75 mL (75 mLs Intravenous Contrast Given 11/05/21 1409)     IMPRESSION / MDM / ASSESSMENT AND PLAN / ED COURSE  I reviewed the triage vital signs and the nursing notes.                               Patient's presentation is most consistent with acute presentation with potential threat to life or bodily function.  Differential diagnosis includes, but is not limited to, pneumonia, pulmonary embolism, pleural effusion, CHF, pulmonary fibrosis  Is a 87 year old female presents with shortness of breath.  Apparently she was found dyspneic and was hypoxic with EMS.  She was satting in the mid 70s, arrival to the ED satting 88% on room air so placed on 2 L nasal cannula.  Patient is somewhat somnolent and she is mildly tachypneic she is able to provide some history but then seems to be staring off blankly not providing answers to questions consistently.  Does endorse chest pain denies abdominal pain.  Does not appear fluid overloaded.  I hear crackles primarily at the right base.  Her EKG shows A-fib that is rate controlled she is on Eliquis.  Blood pressure on the softer side.  We will give a fluid bolus, send labs CT head and chest x-ray.  Anticipate admission given her new oxygen requirement.  Patient's chest x-ray looks like interstitial edema versus pneumonitis.  She has a leukocytosis of 17 lactate is normal.  Patient meeting sepsis criteria will send blood cultures.  We will send Pro-Cal.  BNP is also elevated unclear if this is pulmonary edema versus infection.  20 mildly elevated at 25 as well.  Patient compliant with Eliquis I think PE is less likely.  Blood gases reassuring.  She received a liter of fluid blood pressure still somewhat soft but MAP over 65.  Given potential pulmonary edema will defer further fluids at this time.  On reassessment patient is grimacing when palpating her abdomen so we will obtain a CT of her abdomen pelvis with contrast while she is there we will also obtain a CT of the chest to further evaluate whether this is pneumonia versus fluid.  CT chest does show some enlargement of subpleural mass seen several months ago, and likely metastatic disease in the liver.  No  other acute findings.  Patient is UA is nitrate positive although only 0-5 white cells likely does not represent a UTI.  She was mildly hypotensive but on reassessment maps over 65.  Overall unclear what is driving her hypoxic respiratory failure suspect interstitial fibrosis as well as new lung mass and does look like she likely has infection as well. Will di/w hospitalist for admission.   FINAL CLINICAL IMPRESSION(S) / ED DIAGNOSES   Final diagnoses:  Acute respiratory failure with hypoxia (HCC)  Sepsis, due to unspecified organism, unspecified whether acute organ dysfunction present [facility])     Rx / DC Orders   ED Discharge Orders     None        Note:  This document was prepared using Dragon voice recognition software and may include unintentional dictation errors.   Rada Hay, MD 11/05/21 [phone]

## 2021-11-05 NOTE — H&P (Signed)
History and Physical    Angela Leonard DGL:[id number] DOB: 05-07-1935 DOA: 11/05/2021  PCP: [facility] [place]  Patient coming from: home  I have personally briefly reviewed patient's old medical records in [place]  Chief Complaint: hypoxia  HPI: Angela Leonard is a 87 y.o. female with medical history significant of rheumatoid on methotrexate, hypertension, atrial fibrillation on Eliquis, pulm fibrosis who presented from home after [facility] nurse found her with O2 sat of 72% on RA.  Pt refused to open her eyes and wouldn't or couldn't participate in history taking.  History obtained from ED notes and from daughter.  Per ems, home health nurse found pt SOB am PTA with a O2 sat of 72% RA.  Daughter reported pt has been sleeping a lot and not eating and drinking much.  No noted fever at home.  Dyspnea has been progressive, and pt had a similar hospitalization back in April 2023 during which pt was found to have a focal mass in the left lower lobe.  Looking back in imaging records, pt has had finding of pulmonary fibrosis since at least 2020.  ED Course: initial vitals: afebrile, pulse 89, BP 108/81, RR 33, sating 88% on room air and was put on 2L [place].  Labs notable for WBC 17.7, BUN 34, BNP 440, trop 25 and 18, procal 4.38.  VBG unremarkable.  CT c/a/p showed Emphysema as well as some degree of pulmonary fibrosis, slight interval enlargement of the LLL mass, and enlarged pretracheal lymph nodes and liver lesions consistent with metastatic disease.  Pt received NS 1L, ceftriaxone and azithromycin in the ED before admission.  Assessment/Plan Principal Problem:   Acute hypoxemic respiratory failure ([place])  # Acute hypoxemic respiratory failure --needed 2L O2 on presentation.  Likely 2/2 chronic likely progressive pulm fibrosis, and likely superimposed PNA or pneumonitis.  Has likely lung cancer though the lung mass is less likely to be cause of pt's hypoxia. Plan: --treat for PNA --Continue  supplemental O2 to keep sats >=90%, wean as tolerated  # Sepsis 2/2 pneumonia --increased RR, leukocytosis.  Procal elevated with hypoxia.  Likely source pneumonia. --started on ceftriaxone and azithro in the ED. Plan: --cont ceftriaxone and azithro for a 5-day course  # Likely lung cancer with mets to liver --daughter does not want to pursue aggressive cancer treatment, but would like to talk with cancer doctor. Plan: --oncology consult, Dr. Janese Banks to see --palliative care consult  # Hypotension --BP soft even after 1L IVF.  Cr 0.83 but BUN elevated at 34, likely indicates dehydration.  Daughter confirmed pt has had poor oral intake recently. Plan: --hold home BP meds --NS@50  for 20 hours  # Somnolence  # Baseline dementia --daughter reported pt has been sleeping a lot recently, and not eating drinking much. Plan: --keep NPO for now due to somnolence and unsafe to swallow  # RA --hold home methotrexate for now  # Paroxysmal Afib on Eliquis --hold home cardizem due to low BP --cont home Eliquis  # Hypoalbuminemia  # Severe malnutrition --albumin 2.1.     DVT prophylaxis: Lovenox SQ Code Status: DNR confirmed with daughter Family Communication: updated daughter on the phone on admission  Disposition Plan: undetermined  Consults called: oncology Level of care: Progressive   Review of Systems: As per HPI otherwise complete review of systems negative.   Past Medical History:  Diagnosis Date   HLD (hyperlipidemia)    Hypertension     Past Surgical History:  Procedure Laterality Date  BACK SURGERY     CHOLECYSTECTOMY N/A 10/14/2014   Procedure: LAPAROSCOPIC CHOLECYSTECTOMY;  Surgeon: Marlyce Huge, MD;  Location: [facility] ORS;  Service: General;  Laterality: N/A;   COLON SURGERY     HIP PINNING,CANNULATED Left 08/12/2020   Procedure: CANNULATED HIP PINNING;  Surgeon: Earnestine Leys, MD;  Location: [facility] ORS;  Service: Orthopedics;  Laterality: Left;      reports that she has quit smoking. She has never used smokeless tobacco. She reports that she does not drink alcohol and does not use drugs.  No Known Allergies  Family History  Problem Relation Age of Onset   Alzheimer's disease Mother    Parkinson's disease Mother    Cancer Father    Heart disease Sister    Parkinson's disease Brother    Heart disease Brother     Prior to Admission medications   Medication Sig Start Date End Date Taking? Authorizing Provider  acetaminophen (TYLENOL) 325 MG tablet Take 2 tablets (650 mg total) by mouth every 6 (six) hours as needed for mild pain or moderate pain (pain score 1-3 or temp > 100.5). 08/15/20  Yes Wieting, Richard, MD  apixaban (ELIQUIS) 5 MG TABS tablet Take 1 tablet (5 mg total) by mouth 2 (two) times daily. 09/10/21  Yes Gherghe, Vella Redhead, MD  CARDIZEM CD 180 MG 24 hr capsule Take 180 mg by mouth daily. 09/11/21  Yes [provider]  cholecalciferol (VITAMIN D) 1000 units tablet Take 1,000 Units by mouth daily.   Yes [provider]  fluorouracil (EFUDEX) 5 % cream Apply topically 2 (two) times daily.   Yes [provider]  folic acid (FOLVITE) 1 MG tablet Take 1 mg by mouth daily.   Yes [provider]  multivitamin-lutein (OCUVITE-LUTEIN) CAPS capsule Take 1 capsule by mouth 2 (two) times daily. 08/15/20  Yes Wieting, Richard, MD  Omega-3 Fatty Acids (FISH OIL) 1000 MG CAPS Take 1 capsule by mouth 2 (two) times daily. 09/11/21  Yes [provider]  ramipril (ALTACE) 10 MG capsule Take 10 mg by mouth daily.   Yes [provider]  senna (SENOKOT) 8.6 MG TABS tablet Take 2 tablets by mouth at bedtime. 09/11/21  Yes [provider]  traZODone (DESYREL) 50 MG tablet Take 0.5 tablets (25 mg total) by mouth at bedtime as needed for sleep. 08/15/20  Yes Wieting, Richard, MD  vitamin B-12 (CYANOCOBALAMIN) 1000 MCG tablet Take 1,000 mcg by mouth daily.   Yes [provider]   diltiazem (TIAZAC) 180 MG 24 hr capsule Take 180 mg by mouth daily. Patient not taking: Reported on 08/31/2021 11/06/20   [provider]  ferrous sulfate 325 (65 FE) MG tablet Take 1 tablet (325 mg total) by mouth daily with breakfast. Patient not taking: Reported on 11/05/2021 08/15/20   Loletha Grayer, MD  HYDROcodone-acetaminophen (NORCO) 7.5-325 MG tablet Take 1 tablet by mouth every 6 (six) hours as needed for severe pain (pain score 7-10). Patient not taking: Reported on 11/05/2021 09/10/21   Caren Griffins, MD  methotrexate (RHEUMATREX) 2.5 MG tablet Take 7 tablets by mouth every Tuesday.     [provider]    Physical Exam: Vitals:   11/05/21 1545 11/05/21 1600 11/05/21 1615 11/05/21 1630  BP: (!) 99/59 (!) 82/63 (!) 80/62 (!) 91/58  Pulse: 89 85 90 79  Resp: (!) 25 (!) 26 (!) 30 (!) 28  Temp:      TempSrc:      SpO2: 100% 97% 97% 97%  Weight:      Height:        Constitutional: NAD, kept eyes closed, refused to respond, temporal wasting CV: No cyanosis.   RESP: normal respiratory effort, on 2L Extremities: No effusions, edema in BLE SKIN: warm, dry   Labs on Admission: I have personally reviewed following labs and imaging studies  CBC: Recent Labs  Lab 11/05/21 1113  WBC 17.7*  NEUTROABS 15.5*  HGB 13.1  HCT 41.4  MCV 104.3*  PLT 299   Basic Metabolic Panel: Recent Labs  Lab 11/05/21 1113  NA 142  K 3.6  CL 111  CO2 26  GLUCOSE 110*  BUN 34*  CREATININE 0.83  CALCIUM 8.9   GFR: Estimated Creatinine Clearance: 40.6 mL/min (by C-G formula based on SCr of 0.83 mg/dL). Liver Function Tests: Recent Labs  Lab 11/05/21 1113  AST 48*  ALT 19  ALKPHOS 104  BILITOT 1.2  PROT 7.5  ALBUMIN 2.1*   No results for input(s): "LIPASE", "AMYLASE" in the last 168 hours. No results for input(s): "AMMONIA" in the last 168 hours. Coagulation Profile: No results for input(s): "INR", "PROTIME" in the last 168 hours. Cardiac Enzymes: No  results for input(s): "CKTOTAL", "CKMB", "CKMBINDEX", "TROPONINI" in the last 168 hours. BNP (last 3 results) No results for input(s): "PROBNP" in the last 8760 hours. HbA1C: No results for input(s): "HGBA1C" in the last 72 hours. CBG: No results for input(s): "GLUCAP" in the last 168 hours. Lipid Profile: No results for input(s): "CHOL", "HDL", "LDLCALC", "TRIG", "CHOLHDL", "LDLDIRECT" in the last 72 hours. Thyroid Function Tests: No results for input(s): "TSH", "T4TOTAL", "FREET4", "T3FREE", "THYROIDAB" in the last 72 hours. Anemia Panel: No results for input(s): "VITAMINB12", "FOLATE", "FERRITIN", "TIBC", "IRON", "RETICCTPCT" in the last 72 hours. Urine analysis:    Component Value Date/Time   COLORURINE AMBER (A) 11/05/2021 1407   APPEARANCEUR HAZY (A) 11/05/2021 1407   LABSPEC 1.027 11/05/2021 1407   PHURINE 5.0 11/05/2021 1407   GLUCOSEU NEGATIVE 11/05/2021 1407   HGBUR SMALL (A) 11/05/2021 1407   BILIRUBINUR NEGATIVE 11/05/2021 1407   KETONESUR NEGATIVE 11/05/2021 1407   PROTEINUR 30 (A) 11/05/2021 1407   NITRITE POSITIVE (A) 11/05/2021 1407   LEUKOCYTESUR NEGATIVE 11/05/2021 1407    Radiological Exams on Admission: CT CHEST ABDOMEN PELVIS W CONTRAST  Result Date: 11/05/2021 CLINICAL DATA:  Sepsis, shortness of breath, hypoxia * Tracking Code: BO * EXAM: CT CHEST, ABDOMEN, AND PELVIS WITH CONTRAST TECHNIQUE: Multidetector CT imaging of the chest, abdomen and pelvis was performed following the standard protocol during bolus administration of intravenous contrast. RADIATION DOSE REDUCTION: This exam was performed according to the departmental dose-optimization program which includes automated exposure control, adjustment of the mA and/or kV according to patient size and/or use of iterative reconstruction technique. CONTRAST:  73mL OMNIPAQUE IOHEXOL 300 MG/ML  SOLN COMPARISON:  CT chest angiogram, 08/30/2021, CT abdomen pelvis, 11/29/2020, MR abdomen, 10/13/2014 FINDINGS: CT CHEST  FINDINGS Cardiovascular: Aortic atherosclerosis. Cardiomegaly. Three-vessel coronary artery calcifications. No pericardial effusion. Mediastinum/Nodes: Mildly enlarged pretracheal lymph nodes measuring up to 1.5 x 1.2 cm (series 3, image 22). Thyroid gland, trachea, and esophagus demonstrate no significant findings. Lungs/Pleura: Slight interval enlargement of a subpleural mass of the superior segment left lower lobe, measuring 2.9 x 2.8 cm, previously 2.7 x 2.4 cm when measured similarly (series 5, image 61). Underlying moderate to severe emphysema as well as some degree of pulmonary fibrosis, featuring irregular peripheral interstitial opacity, septal thickening, and areas of subpleural bronchiolectasis, without clear apical to  basal gradient. No pleural effusion or pneumothorax. Musculoskeletal: No chest wall mass or suspicious osseous lesions identified. Unchanged wedge deformity of the T8 vertebral body (series 7, image 55). CT ABDOMEN PELVIS FINDINGS Hepatobiliary: Multiple rim hypoenhancing lesions throughout the liver, largest of the anterior liver dome, hepatic segment VIII, measuring 6.8 x 4.0 cm (series 3, image 48). Additional index lesion of the left lobe of the liver, hepatic segment II, measures 1.8 x 1.6 cm (series 3, image 53). No gallstones, gallbladder wall thickening, or biliary dilatation. Pancreas: Unremarkable. No pancreatic ductal dilatation or surrounding inflammatory changes. Spleen: Normal in size without significant abnormality. Adrenals/Urinary Tract: Stable, benign left adrenal adenoma measuring 1.5 x 1.4 cm (series 3, image 57). Kidneys are normal, without renal calculi, solid lesion, or hydronephrosis. Bladder is unremarkable. Stomach/Bowel: Stomach is within normal limits. Appendix is not clearly visualized. No evidence of bowel wall thickening, distention, or inflammatory changes. Status post rectosigmoid colon resection and reanastomosis. Pancolonic diverticulosis.  Vascular/Lymphatic: Aortic atherosclerosis. 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 No enlarged abdominal or pelvic lymph nodes. Reproductive: No mass or other abnormality. Other: Midline ventral hernia containing a nonobstructed loop of transverse colon (series 3, image 85). No ascites. Musculoskeletal: No acute osseous findings. IMPRESSION: 1. Slight interval enlargement of a subpleural mass of the superior segment left lower lobe, findings most consistent with enlarging primary lung malignancy. 2. Mildly enlarged pretracheal lymph nodes, highly suspicious for nodal metastatic disease 3. Multiple rim hypoenhancing lesions throughout the liver, consistent with hepatic metastatic disease. 4. Emphysema as well as some degree of pulmonary fibrosis, featuring irregular peripheral interstitial opacity, septal thickening, and areas of subpleural bronchiolectasis, without clear apical to basal gradient. If characterized by ATS pulmonary fibrosis criteria on this non tailored examination, findings are in a probable UIP pattern. 5. Left adrenal adenoma, stable since at least 2016 and benign. 6. Status post rectosigmoid colon resection and reanastomosis. Pancolonic diverticulosis. 7. Cardiomegaly and coronary artery disease. Aortic Atherosclerosis (ICD10-I70.0) and Emphysema (ICD10-J43.9). Electronically Signed   By: Delanna Ahmadi M.D.   On: 11/05/2021 14:38   CT HEAD WO CONTRAST (5MM)  Result Date: 11/05/2021 CLINICAL DATA:  Altered mental status EXAM: CT HEAD WITHOUT CONTRAST TECHNIQUE: Contiguous axial images were obtained from the base of the skull through the vertex without intravenous contrast. RADIATION DOSE REDUCTION: This exam was performed according to the departmental dose-optimization program which includes automated exposure control, adjustment of the mA and/or kV according to patient size and/or use of iterative reconstruction technique. COMPARISON:   08/30/2021 FINDINGS: Brain: No acute intracranial findings are seen. There are no signs of bleeding within the cranium. Cortical sulci are prominent. There is decreased density in the periventricular and subcortical white matter. There is prominence of third and both lateral ventricles. Vascular: There are scattered arterial calcifications. Skull: Unremarkable. Sinuses/Orbits: Unremarkable. Other: None. IMPRESSION: No acute intracranial findings are seen in noncontrast CT brain. Atrophy. Small-vessel disease. Electronically Signed   By: Elmer Picker M.D.   On: 11/05/2021 12:22   DG Chest Portable 1 View  Result Date: 11/05/2021 CLINICAL DATA:  Shortness of breath.  Hypoxia. EXAM: PORTABLE CHEST 1 VIEW COMPARISON:  08/30/2021 FINDINGS: Stable cardiomegaly. Aortic atherosclerotic calcification incidentally noted. Changes of chronic pulmonary fibrosis are again noted. Increased interstitial prominence is seen bilaterally, which may be due to superimposed interstitial edema or pneumonitis. Increased size of masslike opacity is seen in the left perihilar region measuring approximately 4 x 2 cm,  suspicious for neoplasm. IMPRESSION: Chronic pulmonary fibrosis. Increased bilateral interstitial prominence, suspicious for superimposed interstitial edema or pneumonitis. Increased size of masslike opacity in left perihilar region, suspicious for carcinoma. Chest CT with contrast is recommended for further evaluation. Stable cardiomegaly. Electronically Signed   By: Marlaine Hind M.D.   On: 11/05/2021 11:59      Enzo Bi MD [facility]  If 7PM-7AM, please contact night-coverage 11/05/2021, 5:38 PM

## 2021-11-05 NOTE — ED Triage Notes (Signed)
Pt ems from home. Per ems home health nurse found pt SOB this am with a o2 sat of 72% RA. Ems placed pt on 4LNC and transported. Pt with hx dementia.

## 2021-11-05 NOTE — ED Notes (Signed)
Report received from Will, RN

## 2021-11-06 DIAGNOSIS — Z515 Encounter for palliative care: Secondary | ICD-10-CM | POA: Diagnosis not present

## 2021-11-06 DIAGNOSIS — J9601 Acute respiratory failure with hypoxia: Secondary | ICD-10-CM | POA: Diagnosis not present

## 2021-11-06 LAB — CBC
HCT: 38.6 % (ref 36.0–46.0)
Hemoglobin: 11.9 g/dL — ABNORMAL LOW (ref 12.0–15.0)
MCH: 32.3 pg (ref 26.0–34.0)
MCHC: 30.8 g/dL (ref 30.0–36.0)
MCV: 104.9 fL — ABNORMAL HIGH (ref 80.0–100.0)
Platelets: 199 10*3/uL (ref 150–400)
RBC: 3.68 MIL/uL — ABNORMAL LOW (ref 3.87–5.11)
RDW: 16.4 % — ABNORMAL HIGH (ref 11.5–15.5)
WBC: 14 10*3/uL — ABNORMAL HIGH (ref 4.0–10.5)
nRBC: 0.2 % (ref 0.0–0.2)

## 2021-11-06 LAB — BASIC METABOLIC PANEL
Anion gap: 5 (ref 5–15)
BUN: 33 mg/dL — ABNORMAL HIGH (ref 8–23)
CO2: 25 mmol/L (ref 22–32)
Calcium: 8.5 mg/dL — ABNORMAL LOW (ref 8.9–10.3)
Chloride: 114 mmol/L — ABNORMAL HIGH (ref 98–111)
Creatinine, Ser: 0.83 mg/dL (ref 0.44–1.00)
GFR, Estimated: >60 mL/min (ref >60)
Glucose, Bld: 76 mg/dL (ref 70–99)
Potassium: 4.1 mmol/L (ref 3.5–5.1)
Sodium: 144 mmol/L (ref 135–145)

## 2021-11-06 LAB — STREP PNEUMONIAE URINARY ANTIGEN: Strep Pneumo Urinary Antigen: NEGATIVE

## 2021-11-06 LAB — MAGNESIUM: Magnesium: 1.9 mg/dL (ref 1.7–2.4)

## 2021-11-06 LAB — PROCALCITONIN: Procalcitonin: 3.05 ng/mL

## 2021-11-06 MED ORDER — IPRATROPIUM-ALBUTEROL 0.5-2.5 (3) MG/3ML IN SOLN
3.0000 mL | Freq: Three times a day (TID) | RESPIRATORY_TRACT | Status: DC
Start: 2021-11-06 — End: 2021-11-08
  Administered 2021-11-06 – 2021-11-08 (×6): 3 mL via RESPIRATORY_TRACT
  Filled 2021-11-06 (×6): qty 3

## 2021-11-06 MED ORDER — IPRATROPIUM-ALBUTEROL 0.5-2.5 (3) MG/3ML IN SOLN
3.0000 mL | Freq: Once | RESPIRATORY_TRACT | Status: AC
  Administered 2021-11-06: 3 mL via RESPIRATORY_TRACT
  Filled 2021-11-06: qty 3

## 2021-11-06 NOTE — Progress Notes (Signed)
pt keeps going back and forth between yellow and red MEWS. currently she is red due to bp 84/59 MAP 67 and respirations 28. still not in distress and is resting at this time. Talking with patients family however and daughter states all of this started around the same time her appetite diminished a few days ago. Concerns for aspiration brought to NP on call at this time. Currently NPO due to lack of diet with orders to recheck BP in 1hour.

## 2021-11-06 NOTE — Progress Notes (Signed)
Family at bedside. 

## 2021-11-06 NOTE — Progress Notes (Signed)
PROGRESS NOTE    Angela Leonard  DTO:[id number] DOB: September 01, 1934 DOA: 11/05/2021 PCP: [facility], [place]  [id number]   Assessment & Plan:   Principal Problem:   Acute hypoxemic respiratory failure [facility]) Active Problems:   Palliative care encounter   Angela Leonard is a 87 y.o. female with medical history significant of rheumatoid arthritis on methotrexate, hypertension, atrial fibrillation on Eliquis, pulm fibrosis who presented from home after Angela Leonard nurse found her with O2 sat of 72% on RA.   # Acute hypoxemic respiratory failure --needed 2L O2 on presentation.  Likely 2/2 chronic likely progressive pulm fibrosis, and likely superimposed PNA or pneumonitis.  Has likely lung cancer though the lung mass is less likely to be cause of pt's hypoxia. Plan: --treat for PNA --will discharge on 2L home O2, ordered.   # Sepsis 2/2 pneumonia --increased RR, leukocytosis.  Procal elevated with hypoxia.  Likely source pneumonia. --started on ceftriaxone and azithro in the ED. Plan: --cont ceftriaxone and azithromycin, can consider discharge on oral abx after 3 days of IV abx   # Likely lung cancer with mets to liver --daughter does not want to pursue aggressive cancer treatment, but would like to talk with cancer doctor. Plan: --oncology consult, Dr. Janese Banks to see --palliative care consult   # Hypotension --BP soft even after 1L IVF.  Cr 0.83 but BUN elevated at 34, likely indicates dehydration.  Daughter confirmed pt has had poor oral intake recently. --s/p gentle MIVF Plan: --hold home BP meds --encourage oral hydration   # Somnolence  # Baseline dementia --daughter reported pt has been sleeping a lot recently, and not eating drinking much. --mental status improved the day after admission   # RA --hold home methotrexate for now   # Paroxysmal Afib on Eliquis --hold home cardizem due to low BP --cont home Eliquis   # Hypoalbuminemia  # Severe malnutrition --albumin 2.1.      DVT prophylaxis: [id number] Code Status: DNR  Family Communication: daughter updated at bedside today Level of care: Progressive Dispo:   The patient is from: home Anticipated d/c is to: home with hospice Anticipated d/c date is: possible tomorrow   Subjective and Interval History:  Pt was alert and calm today.  Was able to eat her meals.  Denied dyspnea.     Objective: Vitals:   11/06/21 1342 11/06/21 1552 11/06/21 1555 11/06/21 1932  BP:  (!) 93/58 (!) 94/59 106/64  Pulse:  86 80 90  Resp:  15  20  Temp:  97.8 F (36.6 C)  98 F (36.7 C)  TempSrc:      SpO2: 98% 99% 99% 100%  Weight:      Height:        Intake/Output Summary (Last 24 hours) at 11/06/2021 2033 Last data filed at 11/06/2021 1500 Gross per 24 hour  Intake 513.87 ml  Output --  Net 513.87 ml   Filed Weights   11/05/21 1122 11/05/21 2359  Weight: 51 kg 49 kg    Examination:   Constitutional: NAD, alert, oriented to person and hospital HEENT: conjunctivae and lids normal, EOMI CV: No cyanosis.   RESP: normal respiratory effort, on 2L Extremities: No effusions, edema in BLE SKIN: warm, dry Neuro: II - XII grossly intact.   Psych: subdued mood and affect.     Data Reviewed: I have personally reviewed following labs and imaging studies  CBC: Recent Labs  Lab 11/05/21 1113 11/06/21 0512  WBC 17.7* 14.0*  NEUTROABS 15.5*  --  HGB 13.1 11.9*  HCT 41.4 38.6  MCV 104.3* 104.9*  PLT 181 741   Basic Metabolic Panel: Recent Labs  Lab 11/05/21 1113 11/06/21 0512  NA 142 144  K 3.6 4.1  CL 111 114*  CO2 26 25  GLUCOSE 110* 76  BUN 34* 33*  CREATININE 0.83 0.83  CALCIUM 8.9 8.5*  MG  --  1.9   GFR: Estimated Creatinine Clearance: 39 mL/min (by C-G formula based on SCr of 0.83 mg/dL). Liver Function Tests: Recent Labs  Lab 11/05/21 1113  AST 48*  ALT 19  ALKPHOS 104  BILITOT 1.2  PROT 7.5  ALBUMIN 2.1*   No results for input(s): "LIPASE", "AMYLASE" in the last 168  hours. No results for input(s): "AMMONIA" in the last 168 hours. Coagulation Profile: No results for input(s): "INR", "PROTIME" in the last 168 hours. Cardiac Enzymes: No results for input(s): "CKTOTAL", "CKMB", "CKMBINDEX", "TROPONINI" in the last 168 hours. BNP (last 3 results) No results for input(s): "PROBNP" in the last 8760 hours. HbA1C: No results for input(s): "HGBA1C" in the last 72 hours. CBG: No results for input(s): "GLUCAP" in the last 168 hours. Lipid Profile: No results for input(s): "CHOL", "HDL", "LDLCALC", "TRIG", "CHOLHDL", "LDLDIRECT" in the last 72 hours. Thyroid Function Tests: No results for input(s): "TSH", "T4TOTAL", "FREET4", "T3FREE", "THYROIDAB" in the last 72 hours. Anemia Panel: No results for input(s): "VITAMINB12", "FOLATE", "FERRITIN", "TIBC", "IRON", "RETICCTPCT" in the last 72 hours. Sepsis Labs: Recent Labs  Lab 11/05/21 1113 11/05/21 1408 11/06/21 0512  PROCALCITON 4.38  --  3.05  LATICACIDVEN 1.4 1.4  --     Recent Results (from the past 240 hour(s))  Blood culture (routine x 2)     Status: None (Preliminary result)   Collection Time: 11/05/21 11:11 AM   Specimen: BLOOD  Result Value Ref Range Status   Specimen Description BLOOD BLOOD RIGHT ARM  Final   Special Requests   Final    BOTTLES DRAWN AEROBIC AND ANAEROBIC Blood Culture adequate volume   Culture   Final    NO GROWTH < 24 HOURS Performed at [facility], [address] [place], [place] [address]    Report Status PENDING  Incomplete  Blood culture (routine x 2)     Status: None (Preliminary result)   Collection Time: 11/05/21 11:11 AM   Specimen: BLOOD  Result Value Ref Range Status   Specimen Description BLOOD LEFT ANTECUBITAL  Final   Special Requests   Final    BOTTLES DRAWN AEROBIC AND ANAEROBIC Blood Culture adequate volume   Culture   Final    NO GROWTH < 24 HOURS Performed at [facility], [address] [place], [place] [address]    Report  Status PENDING  Incomplete  SARS Coronavirus 2 by RT PCR (hospital order, performed in [place] hospital lab) *cepheid single result test* Anterior Nasal Swab     Status: None   Collection Time: 11/05/21  2:08 PM   Specimen: Anterior Nasal Swab  Result Value Ref Range Status   SARS Coronavirus 2 by RT PCR NEGATIVE NEGATIVE Final    Comment: (NOTE) SARS-CoV-2 target nucleic acids are NOT DETECTED.  The SARS-CoV-2 RNA is generally detectable in upper and lower respiratory specimens during the acute phase of infection. The lowest concentration of SARS-CoV-2 viral copies this assay can detect is 250 copies / mL. A negative result does not preclude SARS-CoV-2 infection and should not be used as the sole basis for treatment or other  patient management decisions.  A negative result may occur with improper specimen collection / handling, submission of specimen other than nasopharyngeal swab, presence of viral mutation(s) within the areas targeted by this assay, and inadequate number of viral copies (<250 copies / mL). A negative result must be combined with clinical observations, patient history, and epidemiological information.  Fact Sheet for Patients:   https://www.patel.info/  Fact Sheet for Healthcare Providers: https://hall.com/  This test is not yet approved or  cleared by the [place] FDA and has been authorized for detection and/or diagnosis of SARS-CoV-2 by FDA under an Emergency Use Authorization (EUA).  This EUA will remain in effect (meaning this test can be used) for the duration of the COVID-19 declaration under Section 564(b)(1) of the Act, 21 U.S.C. section 360bbb-3(b)(1), unless the authorization is terminated or revoked sooner.  Performed at [facility], [address] [place], [place] [address]       Radiology Studies: CT CHEST ABDOMEN PELVIS W CONTRAST  Result Date: 11/05/2021 CLINICAL DATA:   Sepsis, shortness of breath, hypoxia * Tracking Code: BO * EXAM: CT CHEST, ABDOMEN, AND PELVIS WITH CONTRAST TECHNIQUE: Multidetector CT imaging of the chest, abdomen and pelvis was performed following the standard protocol during bolus administration of intravenous contrast. RADIATION DOSE REDUCTION: This exam was performed according to the departmental dose-optimization program which includes automated exposure control, adjustment of the mA and/or kV according to patient size and/or use of iterative reconstruction technique. CONTRAST:  57mL OMNIPAQUE IOHEXOL 300 MG/ML  SOLN COMPARISON:  CT chest angiogram, 08/30/2021, CT abdomen pelvis, 11/29/2020, MR abdomen, 10/13/2014 FINDINGS: CT CHEST FINDINGS Cardiovascular: Aortic atherosclerosis. Cardiomegaly. Three-vessel coronary artery calcifications. No pericardial effusion. Mediastinum/Nodes: Mildly enlarged pretracheal lymph nodes measuring up to 1.5 x 1.2 cm (series 3, image 22). Thyroid gland, trachea, and esophagus demonstrate no significant findings. Lungs/Pleura: Slight interval enlargement of a subpleural mass of the superior segment left lower lobe, measuring 2.9 x 2.8 cm, previously 2.7 x 2.4 cm when measured similarly (series 5, image 61). Underlying moderate to severe emphysema as well as some degree of pulmonary fibrosis, featuring irregular peripheral interstitial opacity, septal thickening, and areas of subpleural bronchiolectasis, without clear apical to basal gradient. No pleural effusion or pneumothorax. Musculoskeletal: No chest wall mass or suspicious osseous lesions identified. Unchanged wedge deformity of the T8 vertebral body (series 7, image 55). CT ABDOMEN PELVIS FINDINGS Hepatobiliary: Multiple rim hypoenhancing lesions throughout the liver, largest of the anterior liver dome, hepatic segment VIII, measuring 6.8 x 4.0 cm (series 3, image 48). Additional index lesion of the left lobe of the liver, hepatic segment II, measures 1.8 x 1.6 cm  (series 3, image 53). No gallstones, gallbladder wall thickening, or biliary dilatation. Pancreas: Unremarkable. No pancreatic ductal dilatation or surrounding inflammatory changes. Spleen: Normal in size without significant abnormality. Adrenals/Urinary Tract: Stable, benign left adrenal adenoma measuring 1.5 x 1.4 cm (series 3, image 57). Kidneys are normal, without renal calculi, solid lesion, or hydronephrosis. Bladder is unremarkable. Stomach/Bowel: Stomach is within normal limits. Appendix is not clearly visualized. No evidence of bowel wall thickening, distention, or inflammatory changes. Status post rectosigmoid colon resection and reanastomosis. Pancolonic diverticulosis. Vascular/Lymphatic: Aortic atherosclerosis. 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 No enlarged abdominal or pelvic lymph nodes. Reproductive: No mass or other abnormality. Other: Midline ventral hernia containing a nonobstructed loop of transverse colon (series 3, image 85). No ascites. Musculoskeletal: No acute osseous findings. IMPRESSION: 1. Slight  interval enlargement of a subpleural mass of the superior segment left lower lobe, findings most consistent with enlarging primary lung malignancy. 2. Mildly enlarged pretracheal lymph nodes, highly suspicious for nodal metastatic disease 3. Multiple rim hypoenhancing lesions throughout the liver, consistent with hepatic metastatic disease. 4. Emphysema as well as some degree of pulmonary fibrosis, featuring irregular peripheral interstitial opacity, septal thickening, and areas of subpleural bronchiolectasis, without clear apical to basal gradient. If characterized by ATS pulmonary fibrosis criteria on this non tailored examination, findings are in a probable UIP pattern. 5. Left adrenal adenoma, stable since at least 2016 and benign. 6. Status post rectosigmoid colon resection and reanastomosis. Pancolonic diverticulosis. 7.  Cardiomegaly and coronary artery disease. Aortic Atherosclerosis (ICD10-I70.0) and Emphysema (ICD10-J43.9). Electronically Signed   By: Delanna Ahmadi M.D.   On: 11/05/2021 14:38   CT HEAD WO CONTRAST (5MM)  Result Date: 11/05/2021 CLINICAL DATA:  Altered mental status EXAM: CT HEAD WITHOUT CONTRAST TECHNIQUE: Contiguous axial images were obtained from the base of the skull through the vertex without intravenous contrast. RADIATION DOSE REDUCTION: This exam was performed according to the departmental dose-optimization program which includes automated exposure control, adjustment of the mA and/or kV according to patient size and/or use of iterative reconstruction technique. COMPARISON:  08/30/2021 FINDINGS: Brain: No acute intracranial findings are seen. There are no signs of bleeding within the cranium. Cortical sulci are prominent. There is decreased density in the periventricular and subcortical white matter. There is prominence of third and both lateral ventricles. Vascular: There are scattered arterial calcifications. Skull: Unremarkable. Sinuses/Orbits: Unremarkable. Other: None. IMPRESSION: No acute intracranial findings are seen in noncontrast CT brain. Atrophy. Small-vessel disease. Electronically Signed   By: Elmer Picker M.D.   On: 11/05/2021 12:22   DG Chest Portable 1 View  Result Date: 11/05/2021 CLINICAL DATA:  Shortness of breath.  Hypoxia. EXAM: PORTABLE CHEST 1 VIEW COMPARISON:  08/30/2021 FINDINGS: Stable cardiomegaly. Aortic atherosclerotic calcification incidentally noted. Changes of chronic pulmonary fibrosis are again noted. Increased interstitial prominence is seen bilaterally, which may be due to superimposed interstitial edema or pneumonitis. Increased size of masslike opacity is seen in the left perihilar region measuring approximately 4 x 2 cm, suspicious for neoplasm. IMPRESSION: Chronic pulmonary fibrosis. Increased bilateral interstitial prominence, suspicious for  superimposed interstitial edema or pneumonitis. Increased size of masslike opacity in left perihilar region, suspicious for carcinoma. Chest CT with contrast is recommended for further evaluation. Stable cardiomegaly. Electronically Signed   By: Marlaine Hind M.D.   On: 11/05/2021 11:59     Scheduled Meds:  apixaban  2.5 mg Oral BID   ipratropium-albuterol  3 mL Nebulization TID   Continuous Infusions:  azithromycin (ZITHROMAX) 500 mg in sodium chloride 0.9 % 250 mL IVPB 500 mg (11/06/21 1422)   cefTRIAXone (ROCEPHIN)  IV Stopped (11/06/21 1500)     LOS: 1 day     Enzo Bi, MD [facility] If 7PM-7AM, please contact night-coverage 11/06/2021, 8:33 PM

## 2021-11-06 NOTE — Consult Note (Signed)
[place] at [facility] [phone] [phone]   Name: Angela Leonard Date: 11/06/2021 MRN: [id number]  DOB: [birth year]  Patient Care Team: [facility], [place] as PCP - General    REASON FOR CONSULTATION: Angela Leonard is a 87 y.o. female with multiple medical problems including dementia, RA on methotrexate, hypertension, atrial fibrillation on Eliquis, and pulmonary fibrosis, who was admitted to the hospital on 11/05/2021 with hypoxic respiratory failure.  Patient was started on treatment for possible pneumonia.  CT of the chest, abdomen, and pelvis revealed enlarging subpleural left lower lobe lung mass, pretracheal lymph nodes, and multiple hepatic lesions concerning for metastatic disease.  Palliative care was consulted to help address goals.  SOCIAL HISTORY:     reports that she has quit smoking. She has never used smokeless tobacco. She reports that she does not drink alcohol and does not use drugs.  Patient is widowed.  She lives at home with her daughter.  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  ADVANCE DIRECTIVES:  On file  CODE STATUS: DNR  PAST MEDICAL HISTORY: Past Medical History:  Diagnosis Date   HLD (hyperlipidemia)    Hypertension     PAST SURGICAL HISTORY:  Past Surgical History:  Procedure Laterality Date   BACK SURGERY     CHOLECYSTECTOMY N/A 10/14/2014   Procedure: LAPAROSCOPIC CHOLECYSTECTOMY;  Surgeon: Marlyce Huge, MD;  Location: [facility] ORS;  Service: General;  Laterality: N/A;   COLON SURGERY     HIP PINNING,CANNULATED Left 08/12/2020   Procedure: CANNULATED HIP PINNING;  Surgeon: Earnestine Leys, MD;  Location: [facility] ORS;  Service: Orthopedics;  Laterality: Left;    HEMATOLOGY/ONCOLOGY HISTORY:  Oncology History   No history exists.    ALLERGIES:  has No Known Allergies.  MEDICATIONS:  Current Facility-Administered  Medications  Medication Dose Route Frequency Provider Last Rate Last Admin   0.9 %  sodium chloride infusion   Intravenous Continuous Enzo Bi, MD       apixaban Arne Cleveland) tablet 2.5 mg  2.5 mg Oral BID Enzo Bi, MD   2.5 mg at 11/06/21 1015   azithromycin (ZITHROMAX) 500 mg in sodium chloride 0.9 % 250 mL IVPB  500 mg Intravenous Q24H Enzo Bi, MD       cefTRIAXone (ROCEPHIN) 2 g in sodium chloride 0.9 % 100 mL IVPB  2 g Intravenous Q24H Enzo Bi, MD       ipratropium-albuterol (DUONEB) 0.5-2.5 (3) MG/3ML nebulizer solution 3 mL  3 mL Nebulization TID Enzo Bi, MD        VITAL SIGNS: BP 97/67 (BP Location: Right Arm)   Pulse 93   Temp (!) 97.5 F (36.4 C)   Resp 15   Ht 5' 5"  (1.651 m)   Wt 108 lb 0.4 oz (49 kg)   SpO2 95%   BMI 17.98 kg/m  Filed Weights   11/05/21 1122 11/05/21 2359  Weight: 112 lb 7 oz (51 kg) 108 lb 0.4 oz (49 kg)    Estimated body mass index is 17.98 kg/m as calculated from the following:   Height as of this encounter: 5' 5"  (1.651 m).   Weight as of this encounter: 108 lb 0.4 oz (49 kg).  LABS: CBC:    Component Value Date/Time   WBC 14.0 (H) 11/06/2021 0512   HGB 11.9 (L) 11/06/2021 0512   HCT 38.6 11/06/2021 0512   PLT 199 11/06/2021 0623  MCV 104.9 (H) 11/06/2021 0512   NEUTROABS 15.5 (H) 11/05/2021 1113   LYMPHSABS 0.9 11/05/2021 1113   MONOABS 1.1 (H) 11/05/2021 1113   EOSABS 0.1 11/05/2021 1113   BASOSABS 0.0 11/05/2021 1113   Comprehensive Metabolic Panel:    Component Value Date/Time   NA 144 11/06/2021 0512   K 4.1 11/06/2021 0512   CL 114 (H) 11/06/2021 0512   CO2 25 11/06/2021 0512   BUN 33 (H) 11/06/2021 0512   CREATININE 0.83 11/06/2021 0512   GLUCOSE 76 11/06/2021 0512   CALCIUM 8.5 (L) 11/06/2021 0512   AST 48 (H) 11/05/2021 1113   ALT 19 11/05/2021 1113   ALKPHOS 104 11/05/2021 1113   BILITOT 1.2 11/05/2021 1113   PROT 7.5 11/05/2021 1113   ALBUMIN 2.1 (L) 11/05/2021 1113    RADIOGRAPHIC STUDIES: CT CHEST  ABDOMEN PELVIS W CONTRAST  Result Date: 11/05/2021 CLINICAL DATA:  Sepsis, shortness of breath, hypoxia * Tracking Code: BO * EXAM: CT CHEST, ABDOMEN, AND PELVIS WITH CONTRAST TECHNIQUE: Multidetector CT imaging of the chest, abdomen and pelvis was performed following the standard protocol during bolus administration of intravenous contrast. RADIATION DOSE REDUCTION: This exam was performed according to the departmental dose-optimization program which includes automated exposure control, adjustment of the mA and/or kV according to patient size and/or use of iterative reconstruction technique. CONTRAST:  31m OMNIPAQUE IOHEXOL 300 MG/ML  SOLN COMPARISON:  CT chest angiogram, 08/30/2021, CT abdomen pelvis, 11/29/2020, MR abdomen, 10/13/2014 FINDINGS: CT CHEST FINDINGS Cardiovascular: Aortic atherosclerosis. Cardiomegaly. Three-vessel coronary artery calcifications. No pericardial effusion. Mediastinum/Nodes: Mildly enlarged pretracheal lymph nodes measuring up to 1.5 x 1.2 cm (series 3, image 22). Thyroid gland, trachea, and esophagus demonstrate no significant findings. Lungs/Pleura: Slight interval enlargement of a subpleural mass of the superior segment left lower lobe, measuring 2.9 x 2.8 cm, previously 2.7 x 2.4 cm when measured similarly (series 5, image 61). Underlying moderate to severe emphysema as well as some degree of pulmonary fibrosis, featuring irregular peripheral interstitial opacity, septal thickening, and areas of subpleural bronchiolectasis, without clear apical to basal gradient. No pleural effusion or pneumothorax. Musculoskeletal: No chest wall mass or suspicious osseous lesions identified. Unchanged wedge deformity of the T8 vertebral body (series 7, image 55). CT ABDOMEN PELVIS FINDINGS Hepatobiliary: Multiple rim hypoenhancing lesions throughout the liver, largest of the anterior liver dome, hepatic segment VIII, measuring 6.8 x 4.0 cm (series 3, image 48). Additional index lesion of the  left lobe of the liver, hepatic segment II, measures 1.8 x 1.6 cm (series 3, image 53). No gallstones, gallbladder wall thickening, or biliary dilatation. Pancreas: Unremarkable. No pancreatic ductal dilatation or surrounding inflammatory changes. Spleen: Normal in size without significant abnormality. Adrenals/Urinary Tract: Stable, benign left adrenal adenoma measuring 1.5 x 1.4 cm (series 3, image 57). Kidneys are normal, without renal calculi, solid lesion, or hydronephrosis. Bladder is unremarkable. Stomach/Bowel: Stomach is within normal limits. Appendix is not clearly visualized. No evidence of bowel wall thickening, distention, or inflammatory changes. Status post rectosigmoid colon resection and reanastomosis. Pancolonic diverticulosis. Vascular/Lymphatic: Aortic atherosclerosis. 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 No enlarged abdominal or pelvic lymph nodes. Reproductive: No mass or other abnormality. Other: Midline ventral hernia containing a nonobstructed loop of transverse colon (series 3, image 85). No ascites. Musculoskeletal: No acute osseous findings. IMPRESSION: 1. Slight interval enlargement of a subpleural mass of the superior segment left lower lobe, findings most consistent with enlarging primary lung malignancy. 2.  Mildly enlarged pretracheal lymph nodes, highly suspicious for nodal metastatic disease 3. Multiple rim hypoenhancing lesions throughout the liver, consistent with hepatic metastatic disease. 4. Emphysema as well as some degree of pulmonary fibrosis, featuring irregular peripheral interstitial opacity, septal thickening, and areas of subpleural bronchiolectasis, without clear apical to basal gradient. If characterized by ATS pulmonary fibrosis criteria on this non tailored examination, findings are in a probable UIP pattern. 5. Left adrenal adenoma, stable since at least 2016 and benign. 6. Status post rectosigmoid colon  resection and reanastomosis. Pancolonic diverticulosis. 7. Cardiomegaly and coronary artery disease. Aortic Atherosclerosis (ICD10-I70.0) and Emphysema (ICD10-J43.9). Electronically Signed   By: Delanna Ahmadi M.D.   On: 11/05/2021 14:38   CT HEAD WO CONTRAST (5MM)  Result Date: 11/05/2021 CLINICAL DATA:  Altered mental status EXAM: CT HEAD WITHOUT CONTRAST TECHNIQUE: Contiguous axial images were obtained from the base of the skull through the vertex without intravenous contrast. RADIATION DOSE REDUCTION: This exam was performed according to the departmental dose-optimization program which includes automated exposure control, adjustment of the mA and/or kV according to patient size and/or use of iterative reconstruction technique. COMPARISON:  08/30/2021 FINDINGS: Brain: No acute intracranial findings are seen. There are no signs of bleeding within the cranium. Cortical sulci are prominent. There is decreased density in the periventricular and subcortical white matter. There is prominence of third and both lateral ventricles. Vascular: There are scattered arterial calcifications. Skull: Unremarkable. Sinuses/Orbits: Unremarkable. Other: None. IMPRESSION: No acute intracranial findings are seen in noncontrast CT brain. Atrophy. Small-vessel disease. Electronically Signed   By: Elmer Picker M.D.   On: 11/05/2021 12:22   DG Chest Portable 1 View  Result Date: 11/05/2021 CLINICAL DATA:  Shortness of breath.  Hypoxia. EXAM: PORTABLE CHEST 1 VIEW COMPARISON:  08/30/2021 FINDINGS: Stable cardiomegaly. Aortic atherosclerotic calcification incidentally noted. Changes of chronic pulmonary fibrosis are again noted. Increased interstitial prominence is seen bilaterally, which may be due to superimposed interstitial edema or pneumonitis. Increased size of masslike opacity is seen in the left perihilar region measuring approximately 4 x 2 cm, suspicious for neoplasm. IMPRESSION: Chronic pulmonary fibrosis. Increased  bilateral interstitial prominence, suspicious for superimposed interstitial edema or pneumonitis. Increased size of masslike opacity in left perihilar region, suspicious for carcinoma. Chest CT with contrast is recommended for further evaluation. Stable cardiomegaly. Electronically Signed   By: Marlaine Hind M.D.   On: 11/05/2021 11:59    PERFORMANCE STATUS (ECOG) : 3 - Symptomatic, >50% confined to bed  Review of Systems Unable to provide  Physical Exam General: NAD Pulmonary: Unlabored Extremities: no edema, no joint deformities Skin: no rashes Neurological: Confused  IMPRESSION: I met with patient's daughter who is patient's Advice worker.  At baseline, patient lives at home with her daughter.  Daughter has hired 24/7 caregivers as patient's dementia is fairly advanced.  Patient requires assistance with ADLs including bathing, dressing, and feeding.  Patient is able to verbally communicate but at times does not recognize family.  She ambulates with use of a walker.  Estimated fast score of 6.   Daughter recognizes that imaging appears consistent with an advanced malignancy.  Daughter says that she has seen patient decline recently with poor oral intake and progressive weakness.  She does not feel that patient could or would want to pursue further work-up or cancer treatment.  Instead, she says that she would want to focus on taking patient home and keeping her comfortable until the end of life.  We had a lengthy conversation regarding hospice  care as a way of meeting daughter's goal of comfort.  Patient was enrolled in hospice for about a month in 2021 prior to daughter revoking services.  Daughter says that she felt that hospice was "pushy" and made her feel that patient was imminently dying.  She wants to speak with patient's hired caregivers prior to making a decision on whether to again pursue hospice services.  If daughter opts not to pursue hospice, I would recommend  community palliative care follow patient at home. I would also be happy to follow patient virtually from our clinic.   PLAN: -Best supportive care -Daughter is not interested in pursuing further cancer work-up or treatment -Daughter is thinking about hospice -Will follow  Case and plan discussed with Dr. Janese Banks  Time Total: 60 minutes  Visit consisted of counseling and education dealing with the complex and emotionally intense issues of symptom management and palliative care in the setting of serious and potentially life-threatening illness.Greater than 50%  of this time was spent counseling and coordinating care related to the above assessment and plan.  Signed by: Altha Harm, PhD, NP-C

## 2021-11-06 NOTE — Consult Note (Signed)
Hematology/Oncology Consult note [facility] [phone] [phone]  Patient Care Team: [facility], [place] as PCP - General   Name of the patient: Angela Leonard  [id number]  [birth year]    Reason for consult: concern for lung cancer   Requesting physician: Dr. Billie Ruddy  Date of visit: 11/06/2021    History of presenting illness- Patient is a 87 year old female with a past medical history significant for rheumatoid arthritis on methotrexate hypertension, A-fib on Eliquis, dementia who presented to the hospital with symptoms of acute hypoxic respiratory failure.  She had a CT chest abdomen and pelvis with contrast done which showed enlargement of subpleural mass in the superior segment of the left lower lobe as compared to her prior CT in April 2023.  Multiple rim-enhancing lesions in the liver concerning for metastatic disease.  Changes of emphysema and pulmonary fibrosis.  Oncology consulted for further management.  Patient's daughter is mainly involved in her care and patient lives with her daughter.  She does have some home health aides coming even prior to her hospitalization.  Patient's daughter states that overall her dementia has been getting worse to the point that at times she does not even recognize her own daughter.  She has been getting progressively weaker and appetite has been going down.  Patient requires assistance with her ADLs.  ECOG PS- 3-4  Pain scale- 0   Review of systems- Review of Systems  Unable to perform ROS: Dementia    No Known Allergies  Patient Active Problem List   Diagnosis Date Noted   Palliative care encounter    Acute hypoxemic respiratory failure ([place]) 11/05/2021   Protein-calorie malnutrition, severe 09/04/2021   Recurrent falls 09/01/2021   Generalized weakness 09/01/2021   Hypomagnesemia 09/01/2021   Shortness of breath 08/30/2021   Acute respiratory failure with hypoxia ([place]) 08/30/2021    History of COVID-19 08/30/2021   Pyuria 08/30/2021   Skin cancer 08/30/2021   Leukocytosis 08/30/2021   Right hip pain 11/29/2020   Atrial fibrillation with RVR (HCC)    Hypotension    Mass of skin of head    Dementia without behavioral disturbance ([place])    Closed hip fracture requiring operative repair, left, sequela 08/11/2020   Dehydration 08/25/2019   Acute lower UTI 08/24/2019   Fall at home, initial encounter 08/24/2019   Hypokalemia 08/24/2019   RA (rheumatoid arthritis) ([place]) 08/24/2019   Fall 08/24/2019   Facial laceration    Chronic atrial fibrillation, unspecified ([place]) 02/19/2019   Sepsis ([place]) 08/03/2018   Atypical pneumonia 08/03/2018   Arthritis of right hip 08/03/2018   HTN (hypertension) 08/03/2018   HLD (hyperlipidemia) 08/03/2018   AKI (acute kidney injury) ([place]) 09/15/2017   Symptomatic cholelithiasis 10/13/2014     Past Medical History:  Diagnosis Date   HLD (hyperlipidemia)    Hypertension      Past Surgical History:  Procedure Laterality Date   BACK SURGERY     CHOLECYSTECTOMY N/A 10/14/2014   Procedure: LAPAROSCOPIC CHOLECYSTECTOMY;  Surgeon: Marlyce Huge, MD;  Location: [facility] ORS;  Service: General;  Laterality: N/A;   COLON SURGERY     HIP PINNING,CANNULATED Left 08/12/2020   Procedure: CANNULATED HIP PINNING;  Surgeon: Earnestine Leys, MD;  Location: [facility] ORS;  Service: Orthopedics;  Laterality: Left;    Social History   Socioeconomic History   Marital status: Widowed    Spouse name: Not on file   Number of children: Not on file   Years of education: Not on  file   Highest education level: Not on file  Occupational History   Not on file  Tobacco Use   Smoking status: Former   Smokeless tobacco: Never  Substance and Sexual Activity   Alcohol use: No   Drug use: No   Sexual activity: Not on file  Other Topics Concern   Not on file  Social History Narrative   Not on file   Social Determinants of Health   Financial  Resource Strain: Not on file  Food Insecurity: Not on file  Transportation Needs: Not on file  Physical Activity: Not on file  Stress: Not on file  Social Connections: Not on file  Intimate Partner Violence: Not on file     Family History  Problem Relation Age of Onset   Alzheimer's disease Mother    Parkinson's disease Mother    Cancer Father    Heart disease Sister    Parkinson's disease Brother    Heart disease Brother      Current Facility-Administered Medications:    apixaban (ELIQUIS) tablet 2.5 mg, 2.5 mg, Oral, BID, Enzo Bi, MD, 2.5 mg at 11/06/21 1015   azithromycin (ZITHROMAX) 500 mg in sodium chloride 0.9 % 250 mL IVPB, 500 mg, Intravenous, Q24H, Enzo Bi, MD, Last Rate: 250 mL/hr at 11/06/21 1422, 500 mg at 11/06/21 1422   cefTRIAXone (ROCEPHIN) 2 g in sodium chloride 0.9 % 100 mL IVPB, 2 g, Intravenous, Q24H, Enzo Bi, MD, Stopped at 11/06/21 1500   ipratropium-albuterol (DUONEB) 0.5-2.5 (3) MG/3ML nebulizer solution 3 mL, 3 mL, Nebulization, TID, Enzo Bi, MD, 3 mL at 11/06/21 2036   Physical exam:  Vitals:   11/06/21 1552 11/06/21 1555 11/06/21 1932 11/06/21 2036  BP: (!) 93/58 (!) 94/59 106/64   Pulse: 86 80 90   Resp: 15  20   Temp: 97.8 F (36.6 C)  98 F (36.7 C)   TempSrc:      SpO2: 99% 99% 100% 96%  Weight:      Height:       Physical Exam Constitutional:      General: She is not in acute distress. Cardiovascular:     Rate and Rhythm: Normal rate and regular rhythm.  Pulmonary:     Effort: Pulmonary effort is normal.     Breath sounds: Normal breath sounds.  Abdominal:     General: Bowel sounds are normal.     Palpations: Abdomen is soft.  Skin:    General: Skin is warm and dry.  Neurological:     General: No focal deficit present.     Mental Status: She is alert.           Latest Ref Rng & Units 11/06/2021    5:12 AM  CMP  Glucose 70 - 99 mg/dL 76   BUN 8 - 23 mg/dL 33   Creatinine 0.44 - 1.00 mg/dL 0.83   Sodium 135 -  145 mmol/L 144   Potassium 3.5 - 5.1 mmol/L 4.1   Chloride 98 - 111 mmol/L 114   CO2 22 - 32 mmol/L 25   Calcium 8.9 - 10.3 mg/dL 8.5       Latest Ref Rng & Units 11/06/2021    5:12 AM  CBC  WBC 4.0 - 10.5 K/uL 14.0   Hemoglobin 12.0 - 15.0 g/dL 11.9   Hematocrit 36.0 - 46.0 % 38.6   Platelets 150 - 400 K/uL 199     _0 @  CT CHEST ABDOMEN PELVIS W CONTRAST  Result Date:  11/05/2021 CLINICAL DATA:  Sepsis, shortness of breath, hypoxia * Tracking Code: BO * EXAM: CT CHEST, ABDOMEN, AND PELVIS WITH CONTRAST TECHNIQUE: Multidetector CT imaging of the chest, abdomen and pelvis was performed following the standard protocol during bolus administration of intravenous contrast. RADIATION DOSE REDUCTION: This exam was performed according to the departmental dose-optimization program which includes automated exposure control, adjustment of the mA and/or kV according to patient size and/or use of iterative reconstruction technique. CONTRAST:  56m OMNIPAQUE IOHEXOL 300 MG/ML  SOLN COMPARISON:  CT chest angiogram, 08/30/2021, CT abdomen pelvis, 11/29/2020, MR abdomen, 10/13/2014 FINDINGS: CT CHEST FINDINGS Cardiovascular: Aortic atherosclerosis. Cardiomegaly. Three-vessel coronary artery calcifications. No pericardial effusion. Mediastinum/Nodes: Mildly enlarged pretracheal lymph nodes measuring up to 1.5 x 1.2 cm (series 3, image 22). Thyroid gland, trachea, and esophagus demonstrate no significant findings. Lungs/Pleura: Slight interval enlargement of a subpleural mass of the superior segment left lower lobe, measuring 2.9 x 2.8 cm, previously 2.7 x 2.4 cm when measured similarly (series 5, image 61). Underlying moderate to severe emphysema as well as some degree of pulmonary fibrosis, featuring irregular peripheral interstitial opacity, septal thickening, and areas of subpleural bronchiolectasis, without clear apical to basal gradient. No pleural effusion or pneumothorax. Musculoskeletal: No chest wall  mass or suspicious osseous lesions identified. Unchanged wedge deformity of the T8 vertebral body (series 7, image 55). CT ABDOMEN PELVIS FINDINGS Hepatobiliary: Multiple rim hypoenhancing lesions throughout the liver, largest of the anterior liver dome, hepatic segment VIII, measuring 6.8 x 4.0 cm (series 3, image 48). Additional index lesion of the left lobe of the liver, hepatic segment II, measures 1.8 x 1.6 cm (series 3, image 53). No gallstones, gallbladder wall thickening, or biliary dilatation. Pancreas: Unremarkable. No pancreatic ductal dilatation or surrounding inflammatory changes. Spleen: Normal in size without significant abnormality. Adrenals/Urinary Tract: Stable, benign left adrenal adenoma measuring 1.5 x 1.4 cm (series 3, image 57). Kidneys are normal, without renal calculi, solid lesion, or hydronephrosis. Bladder is unremarkable. Stomach/Bowel: Stomach is within normal limits. Appendix is not clearly visualized. No evidence of bowel wall thickening, distention, or inflammatory changes. Status post rectosigmoid colon resection and reanastomosis. Pancolonic diverticulosis. Vascular/Lymphatic: Aortic atherosclerosis. 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 No enlarged abdominal or pelvic lymph nodes. Reproductive: No mass or other abnormality. Other: Midline ventral hernia containing a nonobstructed loop of transverse colon (series 3, image 85). No ascites. Musculoskeletal: No acute osseous findings. IMPRESSION: 1. Slight interval enlargement of a subpleural mass of the superior segment left lower lobe, findings most consistent with enlarging primary lung malignancy. 2. Mildly enlarged pretracheal lymph nodes, highly suspicious for nodal metastatic disease 3. Multiple rim hypoenhancing lesions throughout the liver, consistent with hepatic metastatic disease. 4. Emphysema as well as some degree of pulmonary fibrosis, featuring irregular  peripheral interstitial opacity, septal thickening, and areas of subpleural bronchiolectasis, without clear apical to basal gradient. If characterized by ATS pulmonary fibrosis criteria on this non tailored examination, findings are in a probable UIP pattern. 5. Left adrenal adenoma, stable since at least 2016 and benign. 6. Status post rectosigmoid colon resection and reanastomosis. Pancolonic diverticulosis. 7. Cardiomegaly and coronary artery disease. Aortic Atherosclerosis (ICD10-I70.0) and Emphysema (ICD10-J43.9). Electronically Signed   By: ADelanna AhmadiM.D.   On: 11/05/2021 14:38   CT HEAD WO CONTRAST (5MM)  Result Date: 11/05/2021 CLINICAL DATA:  Altered mental status EXAM: CT HEAD WITHOUT CONTRAST TECHNIQUE: Contiguous axial images were obtained from the base of the  skull through the vertex without intravenous contrast. RADIATION DOSE REDUCTION: This exam was performed according to the departmental dose-optimization program which includes automated exposure control, adjustment of the mA and/or kV according to patient size and/or use of iterative reconstruction technique. COMPARISON:  08/30/2021 FINDINGS: Brain: No acute intracranial findings are seen. There are no signs of bleeding within the cranium. Cortical sulci are prominent. There is decreased density in the periventricular and subcortical white matter. There is prominence of third and both lateral ventricles. Vascular: There are scattered arterial calcifications. Skull: Unremarkable. Sinuses/Orbits: Unremarkable. Other: None. IMPRESSION: No acute intracranial findings are seen in noncontrast CT brain. Atrophy. Small-vessel disease. Electronically Signed   By: Elmer Picker M.D.   On: 11/05/2021 12:22   DG Chest Portable 1 View  Result Date: 11/05/2021 CLINICAL DATA:  Shortness of breath.  Hypoxia. EXAM: PORTABLE CHEST 1 VIEW COMPARISON:  08/30/2021 FINDINGS: Stable cardiomegaly. Aortic atherosclerotic calcification incidentally noted.  Changes of chronic pulmonary fibrosis are again noted. Increased interstitial prominence is seen bilaterally, which may be due to superimposed interstitial edema or pneumonitis. Increased size of masslike opacity is seen in the left perihilar region measuring approximately 4 x 2 cm, suspicious for neoplasm. IMPRESSION: Chronic pulmonary fibrosis. Increased bilateral interstitial prominence, suspicious for superimposed interstitial edema or pneumonitis. Increased size of masslike opacity in left perihilar region, suspicious for carcinoma. Chest CT with contrast is recommended for further evaluation. Stable cardiomegaly. Electronically Signed   By: Marlaine Hind M.D.   On: 11/05/2021 11:59    Assessment and plan- Patient is a 87 y.o. female with history of advanced dementia, pulmonary fibrosis emphysema not diagnosed with left lower lobe lung mass and liver lesions concerning for liver metastases  I have reviewed CT chest abdomen pelvis images independently and discussed findings with the patient's daughter.  Overall findings are concerning for primary bronchogenic malignancy with liver metastases.  Patient's dementia is slowly progressing and she is dependent on her ADLs.  She sometimes does not recognize her family members.  We discussed if we should pursue further work-up of lung cancer including potential liver biopsy versus focusing on her comfort.  Patient's daughter would not like to proceed with any biopsies at this time.  We discussed different types of lung cancer and briefly management of each type.  I do not think the patient is a candidate for any systemic chemotherapy or even immunotherapy for that matter.  Her overall prognosis is likely less than 6 months and she would be hospice eligible.  However patient's daughter does not feel quite ready for enrolling patient into hospice yet.  NP Altha Harm from palliative care team has also met with her and will be following up with patient's daughter as  an outpatient to see how we can help her upon discharge.  No further oncology input required at this time    Visit Diagnosis Lung mass Liver metastases  Dr. Randa Evens, MD, MPH [facility] [phone] 11/06/2021

## 2021-11-06 NOTE — Progress Notes (Signed)
Patient arrived to unit and her respirations are 39-41 a minute. she is not in distress, on 3L of O2, HR 100 BP 110/70. pt doesn't have any prn breathing treaments. At this time NP on call notified and one time breathing treatment to be given by respiratory.

## 2021-11-07 ENCOUNTER — Other Ambulatory Visit: Payer: Self-pay

## 2021-11-07 DIAGNOSIS — J9601 Acute respiratory failure with hypoxia: Secondary | ICD-10-CM | POA: Diagnosis not present

## 2021-11-07 LAB — CBC
HCT: 34.4 % — ABNORMAL LOW (ref 36.0–46.0)
Hemoglobin: 11 g/dL — ABNORMAL LOW (ref 12.0–15.0)
MCH: 32.7 pg (ref 26.0–34.0)
MCHC: 32 g/dL (ref 30.0–36.0)
MCV: 102.4 fL — ABNORMAL HIGH (ref 80.0–100.0)
Platelets: 197 10*3/uL (ref 150–400)
RBC: 3.36 MIL/uL — ABNORMAL LOW (ref 3.87–5.11)
RDW: 16 % — ABNORMAL HIGH (ref 11.5–15.5)
WBC: 13.6 10*3/uL — ABNORMAL HIGH (ref 4.0–10.5)
nRBC: 0.1 % (ref 0.0–0.2)

## 2021-11-07 LAB — BASIC METABOLIC PANEL
Anion gap: 6 (ref 5–15)
BUN: 30 mg/dL — ABNORMAL HIGH (ref 8–23)
CO2: 22 mmol/L (ref 22–32)
Calcium: 8.4 mg/dL — ABNORMAL LOW (ref 8.9–10.3)
Chloride: 108 mmol/L (ref 98–111)
Creatinine, Ser: 0.52 mg/dL (ref 0.44–1.00)
GFR, Estimated: >60 mL/min (ref >60)
Glucose, Bld: 125 mg/dL — ABNORMAL HIGH (ref 70–99)
Potassium: 3.5 mmol/L (ref 3.5–5.1)
Sodium: 136 mmol/L (ref 135–145)

## 2021-11-07 LAB — MAGNESIUM: Magnesium: 1.7 mg/dL (ref 1.7–2.4)

## 2021-11-07 NOTE — Progress Notes (Signed)
Lab Technician Ranee Gosselin has recollected patient's labs (and tubed Green vial) to retest for any abnormal values, Inquired if patient was on Lactate Ringers. Patient is not on Lactate Ringers this shift

## 2021-11-07 NOTE — Progress Notes (Addendum)
SATURATION QUALIFICATIONS: (This note is used to comply with regulatory documentation for home oxygen)  Patient Saturations on Room Air at Rest = 88%   Patient Saturations on 2 Liters of oxygen = 96%

## 2021-11-07 NOTE — TOC Initial Note (Addendum)
Transition of Care [facility]) - Initial/Assessment Note    Patient Details  Name: Angela Leonard MRN: [id number] Date of Birth: [birth year]  [facility] CM/SW Contact:    Candie Chroman, LCSW Phone Number: 11/07/2021, 11:59 AM  Clinical Narrative:  Readmission prevention screen complete. CSW met with patient. Daughter and caregiver at bedside. CSW introduced role and explained that discharge planning would be discussed. PCP is Carylon Perches, [place] at [facility]. Caregiver transports to appointments. Pharmacy is [facility] on [address] of [address]. No issues obtaining medications. She is active with [facility] for home health PT and OT. She is 24/7 caregivers. Patient has a wheelchair, RW, shower chair, handicapped shower, tempur-pedic bed, and lift chair at home. She is not on oxygen at home. Family requesting a portable tank that provides continuous oxygen. [employer] does not provide this. Left message for [employer] representative to see if they do. No further concerns. CSW encouraged patient, her daughter, and caregiver to contact CSW as needed. CSW will continue to follow patient, daughter, and caregiver for support and facilitate return home when stable, likely tomorrow. Caregiver will drive her home at discharge.                2:04 pm: [employer] only provides pulse oxygen through their portable tanks. [employer] does as well but they have C and D tanks that provide continuous oxygen. A [employer] clinician will come out to the home to review portable oxygen options so they can see what is most comfortable for patient. Explained this to daughter and caregiver and they are agreeable. Ordered through [place]. RN will complete sats test.   Expected Discharge Plan: [place] Barriers to Discharge: Continued Medical Work up   Patient Goals and CMS Choice     Choice offered to / list presented to : NA  Expected Discharge Plan and Services Expected  Discharge Plan: [place] Acute Care Choice: Resumption of Svcs/PTA Provider Living arrangements for the past 2 months: Single Family Home                           HH Arranged: PT, OT HH Agency: [place] Date [facility] Agency Contacted: 11/07/21   Representative spoke with at [place]: Sharmon Revere  Prior Living Arrangements/Services Living arrangements for the past 2 months: [place]   Patient language and need for interpreter reviewed:: Yes Do you feel safe going back to the place where you live?: Yes      Need for Family Participation in Patient Care: Yes (Comment) Care giver support system in place?: Yes (comment) Current home services: DME, Home OT, Home PT, Other (comment) (24/7 caregivers) Criminal Activity/Legal Involvement Pertinent to Current Situation/Hospitalization: No - Comment as needed  Activities of Daily Living Home Assistive Devices/Equipment: None ADL Screening (condition at time of admission) Patient's cognitive ability adequate to safely complete daily activities?: No Is the patient deaf or have difficulty hearing?: Yes Does the patient have difficulty seeing, even when wearing glasses/contacts?: Yes Does the patient have difficulty concentrating, remembering, or making decisions?: Yes Patient able to express need for assistance with ADLs?: Yes Does the patient have difficulty dressing or bathing?: Yes Independently performs ADLs?: Yes (appropriate for developmental age) (Care assistance at home) Does the patient have difficulty walking or climbing stairs?: Yes Weakness of Legs: Both Weakness of Arms/Hands: Both  Permission  Sought/Granted Permission sought to share information with : Facility Sport and exercise psychologist, Family Supports    Share Information with NAME: Ife Vitelli  Permission granted to share info w AGENCY: [place] granted to share info w Relationship:  Daughter  Permission granted to share info w Contact Information: [phone]  Emotional Assessment Appearance:: Appears stated age Attitude/Demeanor/Rapport: Engaged Affect (typically observed): Appropriate, Calm, Pleasant Orientation: : Oriented to Self, Oriented to Place Alcohol / Substance Use: Not Applicable Psych Involvement: No (comment)  Admission diagnosis:  Acute respiratory failure with hypoxia ([place]) [J96.01] Acute hypoxemic respiratory failure ([place]) [J96.01] Sepsis, due to unspecified organism, unspecified whether acute organ dysfunction present [facility]) [A41.9] Patient Active Problem List   Diagnosis Date Noted   Palliative care encounter    Acute hypoxemic respiratory failure ([place]) 11/05/2021   Protein-calorie malnutrition, severe 09/04/2021   Recurrent falls 09/01/2021   Generalized weakness 09/01/2021   Hypomagnesemia 09/01/2021   Shortness of breath 08/30/2021   Acute respiratory failure with hypoxia ([place]) 08/30/2021   History of COVID-19 08/30/2021   Pyuria 08/30/2021   Skin cancer 08/30/2021   Leukocytosis 08/30/2021   Right hip pain 11/29/2020   Atrial fibrillation with RVR ([place])    Hypotension    Mass of skin of head    Dementia without behavioral disturbance ([place])    Closed hip fracture requiring operative repair, left, sequela 08/11/2020   Dehydration 08/25/2019   Acute lower UTI 08/24/2019   Fall at home, initial encounter 08/24/2019   Hypokalemia 08/24/2019   RA (rheumatoid arthritis) ([place]) 08/24/2019   Fall 08/24/2019   Facial laceration    Chronic atrial fibrillation, unspecified ([place]) 02/19/2019   Sepsis ([place]) 08/03/2018   Atypical pneumonia 08/03/2018   Arthritis of right hip 08/03/2018   HTN (hypertension) 08/03/2018   HLD (hyperlipidemia) 08/03/2018   AKI (acute kidney injury) ([place]) 09/15/2017   Symptomatic cholelithiasis 10/13/2014   PCP:  [facility], [place]:   [facility] [place] [place] [address] [address] Phone: [phone] Fax: [phone]     Social Determinants of Health (SDOH) Interventions    Readmission Risk Interventions    11/07/2021   11:52 AM 08/13/2020    1:17 PM  Readmission Risk Prevention Plan  Transportation Screening Complete Complete  PCP or Specialist Appt within 3-5 Days Complete Complete  HRI or [place] Complete Complete  Social Work Consult for [place] Planning/Counseling Complete Complete  Palliative Care Screening Complete Not Applicable  Medication Review Press photographer) Complete Complete

## 2021-11-07 NOTE — Progress Notes (Signed)
PROGRESS NOTE    Angela Leonard  XQJ:[id number] DOB: [birth year] DOA: 11/05/2021 PCP: [facility]    Brief Narrative:  87 y.o. female with medical history significant of rheumatoid arthritis on methotrexate, hypertension, atrial fibrillation on Eliquis, pulm fibrosis who presented from home after [facility] nurse found her with O2 sat of 72% on RA.  Inpatient work-up revealed acute respiratory failure secondary to pneumonia.  More concerning is diagnosis of likely lung cancer with metastasis to liver.  Oncology and palliative care involved.  At this time patient's daughter is not interested in pursuing aggressive medical therapy.  Hospice was discussed as patient is likely appropriate for this disposition however daughter is reluctant to agree at this time due to previous past negative experiences.  Lengthy discussion with daughter and patient at bedside 6/14.  At this time we will plan to monitor in-house for the next 24 hours, ensure resolution of sepsis physiology and discharged home in the care of the family with outpatient palliative care referral  Assessment & Plan:   Principal Problem:   Acute hypoxemic respiratory failure ([place]) Active Problems:   Palliative care encounter  # Acute hypoxemic respiratory failure --needed 2L O2 on presentation.  Likely 2/2 chronic likely progressive pulm fibrosis, and likely superimposed PNA or pneumonitis.  Has likely lung cancer though the lung mass is less likely to be cause of pt's hypoxia. Plan: Treatment for pneumonia as below Discharge home with home oxygen 6/15   # Sepsis 2/2 pneumonia --increased RR, leukocytosis.  Procal elevated with hypoxia.  Likely source pneumonia. --started on ceftriaxone and azithro in the ED. Plan: Continue IV Rocephin and azithromycin for now.  If patient continues to clinically improve consider transition to p.o. antibiotics 6/15 in preparation for discharge home.   # Likely lung cancer with mets to  liver --daughter does not want to pursue aggressive cancer treatment, but would like to talk with cancer doctor. Plan: Oncology has seen patient.  At this time no further work-up or treatment is planned for lung cancer.  Palliative care engaged.  Patient DNR however reluctant for hospice.  We will plan to discharge home 6/15 with outpatient palliative care follow-up   # Hypotension --BP soft even after 1L IVF.  Cr 0.83 but BUN elevated at 34, likely indicates dehydration.  Daughter confirmed pt has had poor oral intake recently. --s/p gentle MIVF Plan: --hold home BP meds --encourage oral hydration   # Somnolence  # Baseline dementia --daughter reported pt has been sleeping a lot recently, and not eating drinking much. --mental status improved the day after admission   # RA --hold home methotrexate for now   # Paroxysmal Afib on Eliquis --hold home cardizem due to low BP --cont home Eliquis   # Hypoalbuminemia  # Severe malnutrition --albumin 2.1.    DVT prophylaxis: Eliquis Code Status: DNR Family Communication: Daughter at bedside 6/14 Disposition Plan: Status is: Inpatient Remains inpatient appropriate because: CAP on IV antibiotics, acute hypoxic respiratory failure   Level of care: Progressive  Consultants:  Oncology Palliative care  Procedures:  None  Antimicrobials: Ceftriaxone Azithromycin   Subjective: Seen and examined.  Appears frail.  Resting in bed.  Does answer questions appropriately.  Objective: Vitals:   11/07/21 0719 11/07/21 0747 11/07/21 1115 11/07/21 1340  BP: 108/74  110/67   Pulse: 95  100   Resp: 19  17   Temp: (!) 97.5 F (36.4 C)  97.9 F (36.6 C)   TempSrc: Oral  Oral  SpO2: 98% 98% 96% 97%  Weight:      Height:        Intake/Output Summary (Last 24 hours) at 11/07/2021 1423 Last data filed at 11/07/2021 1115 Gross per 24 hour  Intake 500.82 ml  Output 550 ml  Net -49.18 ml   Filed Weights   11/05/21 1122 11/05/21  2359  Weight: 51 kg 49 kg    Examination:  General exam: NAD.  Frail-appearing Respiratory system: Coarse crackles bilateral bases.  Normal work of breathing.  2 L Cardiovascular system: S1-S2, regular rate and rhythm, no murmurs, no pedal edema Gastrointestinal system: Thin, soft, NT/ND, normal bowel sounds Central nervous system: Alert, oriented x1, no focal deficits Extremities: Decreased power bilaterally Skin: Thin and pale with no obvious rashes or lesions Psychiatry: Judgement and insight appear impaired. Mood & affect confused.     Data Reviewed: I have personally reviewed following labs and imaging studies  CBC: Recent Labs  Lab 11/05/21 1113 11/06/21 0512 11/07/21 0524  WBC 17.7* 14.0* 13.6*  NEUTROABS 15.5*  --   --   HGB 13.1 11.9* 11.0*  HCT 41.4 38.6 34.4*  MCV 104.3* 104.9* 102.4*  PLT 181 199 782   Basic Metabolic Panel: Recent Labs  Lab 11/05/21 1113 11/06/21 0512 11/07/21 0524  NA 142 144 136  K 3.6 4.1 3.5  CL 111 114* 108  CO2 26 25 22   GLUCOSE 110* 76 125*  BUN 34* 33* 30*  CREATININE 0.83 0.83 0.52  CALCIUM 8.9 8.5* 8.4*  MG  --  1.9 1.7   GFR: Estimated Creatinine Clearance: 40.5 mL/min (by C-G formula based on SCr of 0.52 mg/dL). Liver Function Tests: Recent Labs  Lab 11/05/21 1113  AST 48*  ALT 19  ALKPHOS 104  BILITOT 1.2  PROT 7.5  ALBUMIN 2.1*   No results for input(s): "LIPASE", "AMYLASE" in the last 168 hours. No results for input(s): "AMMONIA" in the last 168 hours. Coagulation Profile: No results for input(s): "INR", "PROTIME" in the last 168 hours. Cardiac Enzymes: No results for input(s): "CKTOTAL", "CKMB", "CKMBINDEX", "TROPONINI" in the last 168 hours. BNP (last 3 results) No results for input(s): "PROBNP" in the last 8760 hours. HbA1C: No results for input(s): "HGBA1C" in the last 72 hours. CBG: No results for input(s): "GLUCAP" in the last 168 hours. Lipid Profile: No results for input(s): "CHOL", "HDL",  "LDLCALC", "TRIG", "CHOLHDL", "LDLDIRECT" in the last 72 hours. Thyroid Function Tests: No results for input(s): "TSH", "T4TOTAL", "FREET4", "T3FREE", "THYROIDAB" in the last 72 hours. Anemia Panel: No results for input(s): "VITAMINB12", "FOLATE", "FERRITIN", "TIBC", "IRON", "RETICCTPCT" in the last 72 hours. Sepsis Labs: Recent Labs  Lab 11/05/21 1113 11/05/21 1408 11/06/21 0512  PROCALCITON 4.38  --  3.05  LATICACIDVEN 1.4 1.4  --     Recent Results (from the past 240 hour(s))  Blood culture (routine x 2)     Status: None (Preliminary result)   Collection Time: 11/05/21 11:11 AM   Specimen: BLOOD  Result Value Ref Range Status   Specimen Description BLOOD BLOOD RIGHT ARM  Final   Special Requests   Final    BOTTLES DRAWN AEROBIC AND ANAEROBIC Blood Culture adequate volume   Culture   Final    NO GROWTH 2 DAYS Performed at [facility], [address], [place], [place] [address]    Report Status PENDING  Incomplete  Blood culture (routine x 2)     Status: None (Preliminary result)   Collection Time: 11/05/21 11:11 AM  Specimen: BLOOD  Result Value Ref Range Status   Specimen Description BLOOD LEFT ANTECUBITAL  Final   Special Requests   Final    BOTTLES DRAWN AEROBIC AND ANAEROBIC Blood Culture adequate volume   Culture   Final    NO GROWTH 2 DAYS Performed at [facility], [address] [place], [place] [address]    Report Status PENDING  Incomplete  SARS Coronavirus 2 by RT PCR (hospital order, performed in [facility] hospital lab) *cepheid single result test* Anterior Nasal Swab     Status: None   Collection Time: 11/05/21  2:08 PM   Specimen: Anterior Nasal Swab  Result Value Ref Range Status   SARS Coronavirus 2 by RT PCR NEGATIVE NEGATIVE Final    Comment: (NOTE) SARS-CoV-2 target nucleic acids are NOT DETECTED.  The SARS-CoV-2 RNA is generally detectable in upper and lower respiratory specimens during the acute phase of infection. The  lowest concentration of SARS-CoV-2 viral copies this assay can detect is 250 copies / mL. A negative result does not preclude SARS-CoV-2 infection and should not be used as the sole basis for treatment or other patient management decisions.  A negative result may occur with improper specimen collection / handling, submission of specimen other than nasopharyngeal swab, presence of viral mutation(s) within the areas targeted by this assay, and inadequate number of viral copies (<250 copies / mL). A negative result must be combined with clinical observations, patient history, and epidemiological information.  Fact Sheet for Patients:   https://www.patel.info/  Fact Sheet for Healthcare Providers: https://hall.com/  This test is not yet approved or  cleared by the [place] FDA and has been authorized for detection and/or diagnosis of SARS-CoV-2 by FDA under an Emergency Use Authorization (EUA).  This EUA will remain in effect (meaning this test can be used) for the duration of the COVID-19 declaration under Section 564(b)(1) of the Act, 21 U.S.C. section 360bbb-3(b)(1), unless the authorization is terminated or revoked sooner.  Performed at [facility], [address] [place], [place] [address]          Radiology Studies: CT CHEST ABDOMEN PELVIS W CONTRAST  Result Date: 11/05/2021 CLINICAL DATA:  Sepsis, shortness of breath, hypoxia * Tracking Code: BO * EXAM: CT CHEST, ABDOMEN, AND PELVIS WITH CONTRAST TECHNIQUE: Multidetector CT imaging of the chest, abdomen and pelvis was performed following the standard protocol during bolus administration of intravenous contrast. RADIATION DOSE REDUCTION: This exam was performed according to the departmental dose-optimization program which includes automated exposure control, adjustment of the mA and/or kV according to patient size and/or use of iterative reconstruction technique.  CONTRAST:  49mL OMNIPAQUE IOHEXOL 300 MG/ML  SOLN COMPARISON:  CT chest angiogram, 08/30/2021, CT abdomen pelvis, 11/29/2020, MR abdomen, 10/13/2014 FINDINGS: CT CHEST FINDINGS Cardiovascular: Aortic atherosclerosis. Cardiomegaly. Three-vessel coronary artery calcifications. No pericardial effusion. Mediastinum/Nodes: Mildly enlarged pretracheal lymph nodes measuring up to 1.5 x 1.2 cm (series 3, image 22). Thyroid gland, trachea, and esophagus demonstrate no significant findings. Lungs/Pleura: Slight interval enlargement of a subpleural mass of the superior segment left lower lobe, measuring 2.9 x 2.8 cm, previously 2.7 x 2.4 cm when measured similarly (series 5, image 61). Underlying moderate to severe emphysema as well as some degree of pulmonary fibrosis, featuring irregular peripheral interstitial opacity, septal thickening, and areas of subpleural bronchiolectasis, without clear apical to basal gradient. No pleural effusion or pneumothorax. Musculoskeletal: No chest wall mass or suspicious osseous lesions identified. Unchanged wedge deformity of the T8 vertebral  body (series 7, image 55). CT ABDOMEN PELVIS FINDINGS Hepatobiliary: Multiple rim hypoenhancing lesions throughout the liver, largest of the anterior liver dome, hepatic segment VIII, measuring 6.8 x 4.0 cm (series 3, image 48). Additional index lesion of the left lobe of the liver, hepatic segment II, measures 1.8 x 1.6 cm (series 3, image 53). No gallstones, gallbladder wall thickening, or biliary dilatation. Pancreas: Unremarkable. No pancreatic ductal dilatation or surrounding inflammatory changes. Spleen: Normal in size without significant abnormality. Adrenals/Urinary Tract: Stable, benign left adrenal adenoma measuring 1.5 x 1.4 cm (series 3, image 57). Kidneys are normal, without renal calculi, solid lesion, or hydronephrosis. Bladder is unremarkable. Stomach/Bowel: Stomach is within normal limits. Appendix is not clearly visualized. No  evidence of bowel wall thickening, distention, or inflammatory changes. Status post rectosigmoid colon resection and reanastomosis. Pancolonic diverticulosis. Vascular/Lymphatic: Aortic atherosclerosis. 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 No enlarged abdominal or pelvic lymph nodes. Reproductive: No mass or other abnormality. Other: Midline ventral hernia containing a nonobstructed loop of transverse colon (series 3, image 85). No ascites. Musculoskeletal: No acute osseous findings. IMPRESSION: 1. Slight interval enlargement of a subpleural mass of the superior segment left lower lobe, findings most consistent with enlarging primary lung malignancy. 2. Mildly enlarged pretracheal lymph nodes, highly suspicious for nodal metastatic disease 3. Multiple rim hypoenhancing lesions throughout the liver, consistent with hepatic metastatic disease. 4. Emphysema as well as some degree of pulmonary fibrosis, featuring irregular peripheral interstitial opacity, septal thickening, and areas of subpleural bronchiolectasis, without clear apical to basal gradient. If characterized by ATS pulmonary fibrosis criteria on this non tailored examination, findings are in a probable UIP pattern. 5. Left adrenal adenoma, stable since at least 2016 and benign. 6. Status post rectosigmoid colon resection and reanastomosis. Pancolonic diverticulosis. 7. Cardiomegaly and coronary artery disease. Aortic Atherosclerosis (ICD10-I70.0) and Emphysema (ICD10-J43.9). Electronically Signed   By: Delanna Ahmadi M.D.   On: 11/05/2021 14:38        Scheduled Meds:  apixaban  2.5 mg Oral BID   ipratropium-albuterol  3 mL Nebulization TID   Continuous Infusions:  azithromycin (ZITHROMAX) 500 mg in sodium chloride 0.9 % 250 mL IVPB 500 mg (11/06/21 1422)   cefTRIAXone (ROCEPHIN)  IV 2 g (11/07/21 1340)     LOS: 2 days      Sidney Ace, MD [facility]   If  7PM-7AM, please contact night-coverage  11/07/2021, 2:23 PM

## 2021-11-08 DIAGNOSIS — J9601 Acute respiratory failure with hypoxia: Secondary | ICD-10-CM | POA: Diagnosis not present

## 2021-11-08 MED ORDER — BENZONATATE 100 MG PO CAPS: 100.0000 mg | ORAL_CAPSULE | Freq: Three times a day (TID) | ORAL | 0 refills | Status: AC | PRN

## 2021-11-08 MED ORDER — LEVOFLOXACIN 750 MG PO TABS: 750.0000 mg | ORAL_TABLET | Freq: Every day | ORAL | 0 refills | Status: AC

## 2021-11-08 NOTE — TOC Transition Note (Signed)
Transition of Care [facility]) - CM/SW Discharge Note   Patient Details  Name: Sana Tessmer MRN: [id number] Date of Birth: 1934-08-26  Transition of [facility] CM/SW Contact:  Candie Chroman, LCSW Phone Number: 11/08/2021, 11:09 AM   Clinical Narrative: Patient has orders to discharge home today. [facility] representative is aware. [facility] is working on order and should have delivery driver on the way soon. Daughter is aware. No further concerns. CSW signing off.    Final next level of care: Home w Home Health Services Barriers to Discharge: Barriers Resolved   Patient Goals and CMS Choice     Choice offered to / list presented to : NA  Discharge Placement                Patient to be transferred to facility by: Caregiver Name of family member notified: Clint Guy Patient and family notified of of transfer: 11/08/21  Discharge Plan and Services     Post Acute Care Choice: Resumption of Svcs/PTA Provider          DME Arranged: Oxygen DME Agency: [facility] Date DME Agency Contacted: 11/08/21   Representative spoke with at DME Agency: [place] HH Arranged: OT, PT [facility] Agency: [place] Date [place]: 11/08/21   Representative spoke with at [place]: [place] ([place]) Interventions     Readmission Risk Interventions    11/07/2021   11:52 AM 08/13/2020    1:17 PM  Readmission Risk Prevention Plan  Transportation Screening Complete Complete  PCP or Specialist Appt within 3-5 Days Complete Complete  [place] or [place] Complete Complete  Social Work Consult for [place] Planning/Counseling Complete Complete  Palliative Care Screening Complete Not Applicable  Medication Review Press photographer) Complete Complete

## 2021-11-08 NOTE — Progress Notes (Signed)
Pt will not keep the 02 on. Her Sa02 were 83% on RA. After the nebulizer treatment, I placed the nasal cannula on. I checked on her several times and the cannula would be off. Each time I placed it back on.

## 2021-11-08 NOTE — Plan of Care (Signed)

## 2021-11-08 NOTE — Discharge Summary (Signed)
Physician Discharge Summary  Angela Leonard RJJ:[id number] DOB: 1934-11-04 DOA: 11/05/2021  PCP: [facility]  Admit date: 11/05/2021 Discharge date: 11/08/2021  Admitted From: Home Disposition: Home with outpatient palliative care follow-up  Recommendations for Outpatient Follow-up:  Follow up with PCP in 1-2 weeks Outpatient palliative care referral  Home Health: Yes PT OT Equipment/Devices: Oxygen 2 L  Discharge Condition: Stable CODE STATUS: DNR Diet recommendation: Dysphagia  Brief/Interim Summary:  87 y.o. female with medical history significant of rheumatoid arthritis on methotrexate, hypertension, atrial fibrillation on Eliquis, pulm fibrosis who presented from home after [facility] nurse found her with O2 sat of 72% on RA.   Inpatient work-up revealed acute respiratory failure secondary to pneumonia.  More concerning is diagnosis of likely lung cancer with metastasis to liver.  Oncology and palliative care involved.  At this time patient's daughter is not interested in pursuing aggressive medical therapy.  Hospice was discussed as patient is likely appropriate for this disposition however daughter is reluctant to agree at this time due to previous past negative experiences.   Lengthy discussion with daughter and patient at bedside 6/14.  At this time we will plan to monitor in-house for the next 24 hours, ensure resolution of sepsis physiology and discharged home in the care of the family with outpatient palliative care referral  6/15: Clinical status continues to improve slowly.  Patient appropriate for discharge home in the care of the daughter and 24/7 caregivers.  Outpatient palliative care referral.  Patient will see Altha Harm NP in follow-up.   Discharge Diagnoses:  Principal Problem:   Acute hypoxemic respiratory failure (HCC) Active Problems:   Palliative care encounter  # Acute hypoxemic respiratory failure --needed 2L O2 on presentation.  Likely 2/2  chronic likely progressive pulm fibrosis, and likely superimposed PNA or pneumonitis.  Has likely lung cancer though the lung mass is less likely to be cause of pt's hypoxia. Plan: Transition to p.o. Levaquin and discharge home   # Sepsis 2/2 pneumonia --increased RR, leukocytosis.  Procal elevated with hypoxia.  Likely source pneumonia. --started on ceftriaxone and azithro in the ED. Plan: As above, transition to p.o. Levaquin at discharge   # Likely lung cancer with mets to liver --daughter does not want to pursue aggressive cancer treatment, but would like to talk with cancer doctor. Plan: Oncology has seen patient.  At this time no further work-up or treatment is planned for lung cancer.  Palliative care engaged.  Patient DNR however reluctant for hospice.  Discharge home with outpatient palliative care follow-up    Discharge Instructions  Discharge Instructions     Diet - low sodium heart healthy   Complete by: As directed    Increase activity slowly   Complete by: As directed       Allergies as of 11/08/2021   No Known Allergies      Medication List     STOP taking these medications    diltiazem 180 MG 24 hr capsule Commonly known as: TIAZAC   ferrous sulfate 325 (65 FE) MG tablet   HYDROcodone-acetaminophen 7.5-325 MG tablet Commonly known as: NORCO       TAKE these medications    acetaminophen 325 MG tablet Commonly known as: TYLENOL Take 2 tablets (650 mg total) by mouth every 6 (six) hours as needed for mild pain or moderate pain (pain score 1-3 or temp > 100.5).   apixaban 5 MG Tabs tablet Commonly known as: ELIQUIS Take 1 tablet (5 mg total) by mouth  2 (two) times daily.   benzonatate 100 MG capsule Commonly known as: Tessalon Perles Take 1 capsule (100 mg total) by mouth 3 (three) times daily as needed for cough.   Cardizem CD 180 MG 24 hr capsule Generic drug: diltiazem Take 180 mg by mouth daily.   cholecalciferol 1000 units  tablet Commonly known as: VITAMIN D Take 1,000 Units by mouth daily.   Fish Oil 1000 MG Caps Take 1 capsule by mouth 2 (two) times daily.   fluorouracil 5 % cream Commonly known as: EFUDEX Apply topically 2 (two) times daily.   folic acid 1 MG tablet Commonly known as: FOLVITE Take 1 mg by mouth daily.   levofloxacin 750 MG tablet Commonly known as: Levaquin Take 1 tablet (750 mg total) by mouth daily for 5 days.   methotrexate 2.5 MG tablet Commonly known as: RHEUMATREX Take 7 tablets by mouth every Tuesday.   multivitamin-lutein Caps capsule Take 1 capsule by mouth 2 (two) times daily.   ramipril 10 MG capsule Commonly known as: ALTACE Take 10 mg by mouth daily.   senna 8.6 MG Tabs tablet Commonly known as: SENOKOT Take 2 tablets by mouth at bedtime.   traZODone 50 MG tablet Commonly known as: DESYREL Take 0.5 tablets (25 mg total) by mouth at bedtime as needed for sleep.   vitamin B-12 1000 MCG tablet Commonly known as: CYANOCOBALAMIN Take 1,000 mcg by mouth daily.               Durable Medical Equipment  (From admission, onward)           Start     Ordered   11/06/21 1502  For home use only DME oxygen  Once       Question Answer Comment  Length of Need Lifetime   Liters per Minute 2   Frequency Continuous (stationary and portable oxygen unit needed)   Oxygen delivery system Gas      11/06/21 1501            No Known Allergies  Consultations: Oncology Palliative care   Procedures/Studies: CT CHEST ABDOMEN PELVIS W CONTRAST  Result Date: 11/05/2021 CLINICAL DATA:  Sepsis, shortness of breath, hypoxia * Tracking Code: BO * EXAM: CT CHEST, ABDOMEN, AND PELVIS WITH CONTRAST TECHNIQUE: Multidetector CT imaging of the chest, abdomen and pelvis was performed following the standard protocol during bolus administration of intravenous contrast. RADIATION DOSE REDUCTION: This exam was performed according to the departmental dose-optimization  program which includes automated exposure control, adjustment of the mA and/or kV according to patient size and/or use of iterative reconstruction technique. CONTRAST:  38mL OMNIPAQUE IOHEXOL 300 MG/ML  SOLN COMPARISON:  CT chest angiogram, 08/30/2021, CT abdomen pelvis, 11/29/2020, MR abdomen, 10/13/2014 FINDINGS: CT CHEST FINDINGS Cardiovascular: Aortic atherosclerosis. Cardiomegaly. Three-vessel coronary artery calcifications. No pericardial effusion. Mediastinum/Nodes: Mildly enlarged pretracheal lymph nodes measuring up to 1.5 x 1.2 cm (series 3, image 22). Thyroid gland, trachea, and esophagus demonstrate no significant findings. Lungs/Pleura: Slight interval enlargement of a subpleural mass of the superior segment left lower lobe, measuring 2.9 x 2.8 cm, previously 2.7 x 2.4 cm when measured similarly (series 5, image 61). Underlying moderate to severe emphysema as well as some degree of pulmonary fibrosis, featuring irregular peripheral interstitial opacity, septal thickening, and areas of subpleural bronchiolectasis, without clear apical to basal gradient. No pleural effusion or pneumothorax. Musculoskeletal: No chest wall mass or suspicious osseous lesions identified. Unchanged wedge deformity of the T8 vertebral body (series 7, image 55).  CT ABDOMEN PELVIS FINDINGS Hepatobiliary: Multiple rim hypoenhancing lesions throughout the liver, largest of the anterior liver dome, hepatic segment VIII, measuring 6.8 x 4.0 cm (series 3, image 48). Additional index lesion of the left lobe of the liver, hepatic segment II, measures 1.8 x 1.6 cm (series 3, image 53). No gallstones, gallbladder wall thickening, or biliary dilatation. Pancreas: Unremarkable. No pancreatic ductal dilatation or surrounding inflammatory changes. Spleen: Normal in size without significant abnormality. Adrenals/Urinary Tract: Stable, benign left adrenal adenoma measuring 1.5 x 1.4 cm (series 3, image 57). Kidneys are normal, without renal  calculi, solid lesion, or hydronephrosis. Bladder is unremarkable. Stomach/Bowel: Stomach is within normal limits. Appendix is not clearly visualized. No evidence of bowel wall thickening, distention, or inflammatory changes. Status post rectosigmoid colon resection and reanastomosis. Pancolonic diverticulosis. Vascular/Lymphatic: Aortic atherosclerosis. 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 No enlarged abdominal or pelvic lymph nodes. Reproductive: No mass or other abnormality. Other: Midline ventral hernia containing a nonobstructed loop of transverse colon (series 3, image 85). No ascites. Musculoskeletal: No acute osseous findings. IMPRESSION: 1. Slight interval enlargement of a subpleural mass of the superior segment left lower lobe, findings most consistent with enlarging primary lung malignancy. 2. Mildly enlarged pretracheal lymph nodes, highly suspicious for nodal metastatic disease 3. Multiple rim hypoenhancing lesions throughout the liver, consistent with hepatic metastatic disease. 4. Emphysema as well as some degree of pulmonary fibrosis, featuring irregular peripheral interstitial opacity, septal thickening, and areas of subpleural bronchiolectasis, without clear apical to basal gradient. If characterized by ATS pulmonary fibrosis criteria on this non tailored examination, findings are in a probable UIP pattern. 5. Left adrenal adenoma, stable since at least 2016 and benign. 6. Status post rectosigmoid colon resection and reanastomosis. Pancolonic diverticulosis. 7. Cardiomegaly and coronary artery disease. Aortic Atherosclerosis (ICD10-I70.0) and Emphysema (ICD10-J43.9). Electronically Signed   By: Delanna Ahmadi M.D.   On: 11/05/2021 14:38   CT HEAD WO CONTRAST (5MM)  Result Date: 11/05/2021 CLINICAL DATA:  Altered mental status EXAM: CT HEAD WITHOUT CONTRAST TECHNIQUE: Contiguous axial images were obtained from the base of the skull  through the vertex without intravenous contrast. RADIATION DOSE REDUCTION: This exam was performed according to the departmental dose-optimization program which includes automated exposure control, adjustment of the mA and/or kV according to patient size and/or use of iterative reconstruction technique. COMPARISON:  08/30/2021 FINDINGS: Brain: No acute intracranial findings are seen. There are no signs of bleeding within the cranium. Cortical sulci are prominent. There is decreased density in the periventricular and subcortical white matter. There is prominence of third and both lateral ventricles. Vascular: There are scattered arterial calcifications. Skull: Unremarkable. Sinuses/Orbits: Unremarkable. Other: None. IMPRESSION: No acute intracranial findings are seen in noncontrast CT brain. Atrophy. Small-vessel disease. Electronically Signed   By: Elmer Picker M.D.   On: 11/05/2021 12:22   DG Chest Portable 1 View  Result Date: 11/05/2021 CLINICAL DATA:  Shortness of breath.  Hypoxia. EXAM: PORTABLE CHEST 1 VIEW COMPARISON:  08/30/2021 FINDINGS: Stable cardiomegaly. Aortic atherosclerotic calcification incidentally noted. Changes of chronic pulmonary fibrosis are again noted. Increased interstitial prominence is seen bilaterally, which may be due to superimposed interstitial edema or pneumonitis. Increased size of masslike opacity is seen in the left perihilar region measuring approximately 4 x 2 cm, suspicious for neoplasm. IMPRESSION: Chronic pulmonary fibrosis. Increased bilateral interstitial prominence, suspicious for superimposed interstitial edema or pneumonitis. Increased size of masslike opacity in left perihilar region, suspicious for  carcinoma. Chest CT with contrast is recommended for further evaluation. Stable cardiomegaly. Electronically Signed   By: Marlaine Hind M.D.   On: 11/05/2021 11:59      Subjective: Seen and examined on day of discharge.  Stable no distress.  Stable for  discharge home  Discharge Exam: Vitals:   11/08/21 0740 11/08/21 1131  BP: 120/65 108/77  Pulse: 87 92  Resp: 17 17  Temp: 98.5 F (36.9 C)   SpO2: 91%    Vitals:   11/08/21 0016 11/08/21 0724 11/08/21 0740 11/08/21 1131  BP: 116/74  120/65 108/77  Pulse: (!) 107  87 92  Resp: 16  17 17   Temp: 98.2 F (36.8 C)  98.5 F (36.9 C)   TempSrc: Oral     SpO2: (!) 85% (!) 83% 91%   Weight:      Height:        General: Pt is alert, awake, not in acute distress Cardiovascular: RRR, S1/S2 +, no rubs, no gallops Respiratory: CTA bilaterally, no wheezing, no rhonchi Abdominal: Soft, NT, ND, bowel sounds + Extremities: no edema, no cyanosis    The results of significant diagnostics from this hospitalization (including imaging, microbiology, ancillary and laboratory) are listed below for reference.     Microbiology: Recent Results (from the past 240 hour(s))  Blood culture (routine x 2)     Status: None (Preliminary result)   Collection Time: 11/05/21 11:11 AM   Specimen: BLOOD  Result Value Ref Range Status   Specimen Description BLOOD BLOOD RIGHT ARM  Final   Special Requests   Final    BOTTLES DRAWN AEROBIC AND ANAEROBIC Blood Culture adequate volume   Culture   Final    NO GROWTH 3 DAYS Performed at [facility], [address] [place], [place] [address]    Report Status PENDING  Incomplete  Blood culture (routine x 2)     Status: None (Preliminary result)   Collection Time: 11/05/21 11:11 AM   Specimen: BLOOD  Result Value Ref Range Status   Specimen Description BLOOD LEFT ANTECUBITAL  Final   Special Requests   Final    BOTTLES DRAWN AEROBIC AND ANAEROBIC Blood Culture adequate volume   Culture   Final    NO GROWTH 3 DAYS Performed at [facility], [address] [place], [place] [address]    Report Status PENDING  Incomplete  SARS Coronavirus 2 by RT PCR (hospital order, performed in [place] hospital lab) *cepheid single result test*  Anterior Nasal Swab     Status: None   Collection Time: 11/05/21  2:08 PM   Specimen: Anterior Nasal Swab  Result Value Ref Range Status   SARS Coronavirus 2 by RT PCR NEGATIVE NEGATIVE Final    Comment: (NOTE) SARS-CoV-2 target nucleic acids are NOT DETECTED.  The SARS-CoV-2 RNA is generally detectable in upper and lower respiratory specimens during the acute phase of infection. The lowest concentration of SARS-CoV-2 viral copies this assay can detect is 250 copies / mL. A negative result does not preclude SARS-CoV-2 infection and should not be used as the sole basis for treatment or other patient management decisions.  A negative result may occur with improper specimen collection / handling, submission of specimen other than nasopharyngeal swab, presence of viral mutation(s) within the areas targeted by this assay, and inadequate number of viral copies (<250 copies / mL). A negative result must be combined with clinical observations, patient history, and epidemiological information.  Fact Sheet for Patients:  https://www.patel.info/  Fact Sheet for Healthcare Providers: https://hall.com/  This test is not yet approved or  cleared by the [place] FDA and has been authorized for detection and/or diagnosis of SARS-CoV-2 by FDA under an Emergency Use Authorization (EUA).  This EUA will remain in effect (meaning this test can be used) for the duration of the COVID-19 declaration under Section 564(b)(1) of the Act, 21 U.S.C. section 360bbb-3(b)(1), unless the authorization is terminated or revoked sooner.  Performed at [facility], [address] [place], [place] [address]      Labs: BNP (last 3 results) Recent Labs    08/30/21 1448 11/05/21 1113  BNP 129.9* 518.8*   Basic Metabolic Panel: Recent Labs  Lab 11/05/21 1113 11/06/21 0512 11/07/21 0524  NA 142 144 136  K 3.6 4.1 3.5  CL 111 114* 108  CO2 26 25  22   GLUCOSE 110* 76 125*  BUN 34* 33* 30*  CREATININE 0.83 0.83 0.52  CALCIUM 8.9 8.5* 8.4*  MG  --  1.9 1.7   Liver Function Tests: Recent Labs  Lab 11/05/21 1113  AST 48*  ALT 19  ALKPHOS 104  BILITOT 1.2  PROT 7.5  ALBUMIN 2.1*   No results for input(s): "LIPASE", "AMYLASE" in the last 168 hours. No results for input(s): "AMMONIA" in the last 168 hours. CBC: Recent Labs  Lab 11/05/21 1113 11/06/21 0512 11/07/21 0524  WBC 17.7* 14.0* 13.6*  NEUTROABS 15.5*  --   --   HGB 13.1 11.9* 11.0*  HCT 41.4 38.6 34.4*  MCV 104.3* 104.9* 102.4*  PLT 181 199 197   Cardiac Enzymes: No results for input(s): "CKTOTAL", "CKMB", "CKMBINDEX", "TROPONINI" in the last 168 hours. BNP: Invalid input(s): "POCBNP" CBG: No results for input(s): "GLUCAP" in the last 168 hours. D-Dimer No results for input(s): "DDIMER" in the last 72 hours. Hgb A1c No results for input(s): "HGBA1C" in the last 72 hours. Lipid Profile No results for input(s): "CHOL", "HDL", "LDLCALC", "TRIG", "CHOLHDL", "LDLDIRECT" in the last 72 hours. Thyroid function studies No results for input(s): "TSH", "T4TOTAL", "T3FREE", "THYROIDAB" in the last 72 hours.  Invalid input(s): "FREET3" Anemia work up No results for input(s): "VITAMINB12", "FOLATE", "FERRITIN", "TIBC", "IRON", "RETICCTPCT" in the last 72 hours. Urinalysis    Component Value Date/Time   COLORURINE AMBER (A) 11/05/2021 1407   APPEARANCEUR HAZY (A) 11/05/2021 1407   LABSPEC 1.027 11/05/2021 1407   PHURINE 5.0 11/05/2021 1407   GLUCOSEU NEGATIVE 11/05/2021 1407   HGBUR SMALL (A) 11/05/2021 1407   BILIRUBINUR NEGATIVE 11/05/2021 1407   KETONESUR NEGATIVE 11/05/2021 1407   PROTEINUR 30 (A) 11/05/2021 1407   NITRITE POSITIVE (A) 11/05/2021 1407   LEUKOCYTESUR NEGATIVE 11/05/2021 1407   Sepsis Labs Recent Labs  Lab 11/05/21 1113 11/06/21 0512 11/07/21 0524  WBC 17.7* 14.0* 13.6*   Microbiology Recent Results (from the past 240 hour(s))   Blood culture (routine x 2)     Status: None (Preliminary result)   Collection Time: 11/05/21 11:11 AM   Specimen: BLOOD  Result Value Ref Range Status   Specimen Description BLOOD BLOOD RIGHT ARM  Final   Special Requests   Final    BOTTLES DRAWN AEROBIC AND ANAEROBIC Blood Culture adequate volume   Culture   Final    NO GROWTH 3 DAYS Performed at [facility], [address] [place], [place] [address]    Report Status PENDING  Incomplete  Blood culture (routine x 2)     Status: None (Preliminary result)  Collection Time: 11/05/21 11:11 AM   Specimen: BLOOD  Result Value Ref Range Status   Specimen Description BLOOD LEFT ANTECUBITAL  Final   Special Requests   Final    BOTTLES DRAWN AEROBIC AND ANAEROBIC Blood Culture adequate volume   Culture   Final    NO GROWTH 3 DAYS Performed at [facility], [address], [place], [place] [address]    Report Status PENDING  Incomplete  SARS Coronavirus 2 by RT PCR (hospital order, performed in [place] hospital lab) *cepheid single result test* Anterior Nasal Swab     Status: None   Collection Time: 11/05/21  2:08 PM   Specimen: Anterior Nasal Swab  Result Value Ref Range Status   SARS Coronavirus 2 by RT PCR NEGATIVE NEGATIVE Final    Comment: (NOTE) SARS-CoV-2 target nucleic acids are NOT DETECTED.  The SARS-CoV-2 RNA is generally detectable in upper and lower respiratory specimens during the acute phase of infection. The lowest concentration of SARS-CoV-2 viral copies this assay can detect is 250 copies / mL. A negative result does not preclude SARS-CoV-2 infection and should not be used as the sole basis for treatment or other patient management decisions.  A negative result may occur with improper specimen collection / handling, submission of specimen other than nasopharyngeal swab, presence of viral mutation(s) within the areas targeted by this assay, and inadequate number of viral copies (<250 copies  / mL). A negative result must be combined with clinical observations, patient history, and epidemiological information.  Fact Sheet for Patients:   https://www.patel.info/  Fact Sheet for Healthcare Providers: https://hall.com/  This test is not yet approved or  cleared by the [place] FDA and has been authorized for detection and/or diagnosis of SARS-CoV-2 by FDA under an Emergency Use Authorization (EUA).  This EUA will remain in effect (meaning this test can be used) for the duration of the COVID-19 declaration under Section 564(b)(1) of the Act, 21 U.S.C. section 360bbb-3(b)(1), unless the authorization is terminated or revoked sooner.  Performed at [facility], [address] [place], [place] [address]      Time coordinating discharge: Over 30 minutes  SIGNED:   Sidney Ace, MD  [facility] 11/08/2021, 1:41 PM Pager   If 7PM-7AM, please contact night-coverage

## 2021-11-08 NOTE — TOC Progression Note (Signed)
Transition of Care [facility]) - Progression Note    Patient Details  Name: Madia Carvell MRN: [id number] Date of Birth: [birth year]  Transition of Care [facility]) CM/SW [place], LCSW Phone Number: 11/08/2021, 9:00 AM  Clinical Narrative:  Ordered home oxygen through [place]. Driver will call CSW when they are on the way to deliver tank for home.   Expected Discharge Plan: [place] Barriers to Discharge: Continued Medical Work up  Expected Discharge Plan and Services Expected Discharge Plan: [place] Choice: Resumption of Svcs/PTA Provider Living arrangements for the past 2 months: Single Family Home Expected Discharge Date: 11/08/21                         HH Arranged: PT, OT HH Agency: [place] Date [place]: 11/07/21   Representative spoke with at [place]: [place] ([place]) Interventions    Readmission Risk Interventions    11/07/2021   11:52 AM 08/13/2020    1:17 PM  Readmission Risk Prevention Plan  Transportation Screening Complete Complete  PCP or Specialist Appt within 3-5 Days Complete Complete  HRI or [place] Complete Complete  Social Work Consult for [place] Planning/Counseling Complete Complete  Palliative Care Screening Complete Not Applicable  Medication Review Press photographer) Complete Complete

## 2021-11-10 LAB — CULTURE, BLOOD (ROUTINE X 2)
Culture: NO GROWTH
Culture: NO GROWTH
Special Requests: ADEQUATE
Special Requests: ADEQUATE

## 2021-11-22 ENCOUNTER — Ambulatory Visit: Payer: Medicare Other | Admitting: Hospice and Palliative Medicine

## 2021-11-24 DEATH — deceased

## 2023-01-17 IMAGING — CT CT HEAD W/O CM
4 series · 17 of 47 positions shown, 19 images · non-contrast
Comparison: 08/30/2021

CLINICAL DATA: Altered mental status



[Series 2: head bone · axial · 0.40mm/px · z∈[+1150,+1202]mm · 4 of 75 slices shown]
[im 8/75  bone]
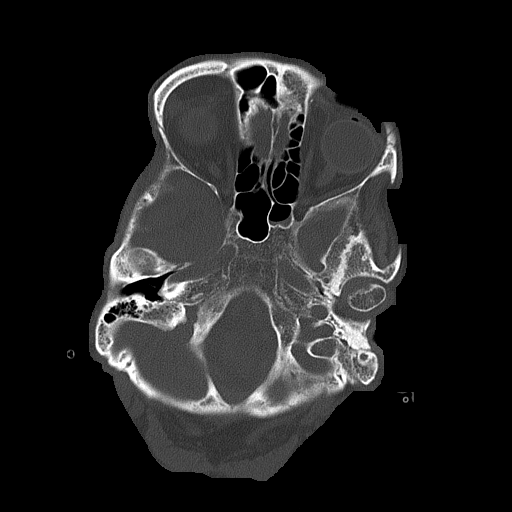
[im 15/75  bone]
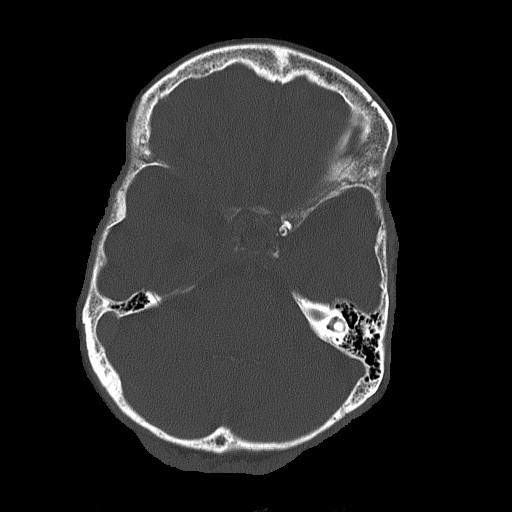
[im 23/75  bone]
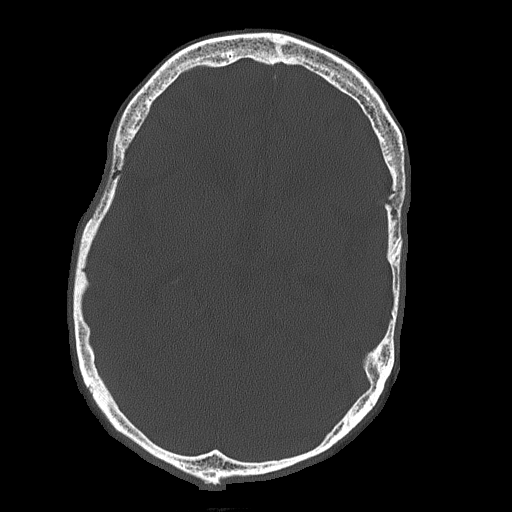
[im 34/75  bone]
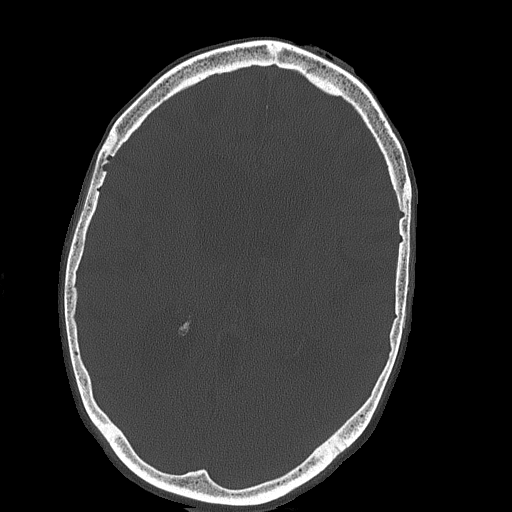

[Series 3: head wo · axial · 0.40mm/px · z∈[+1151,+1261]mm · 7 of 30 slices shown, 9 images]
[im 4/30  brain]
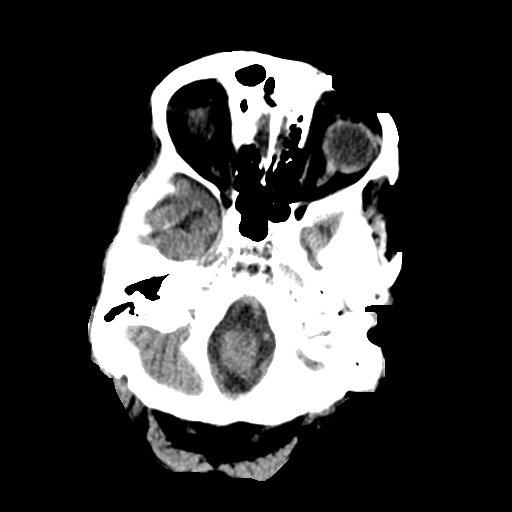
[im 4/30  bone]
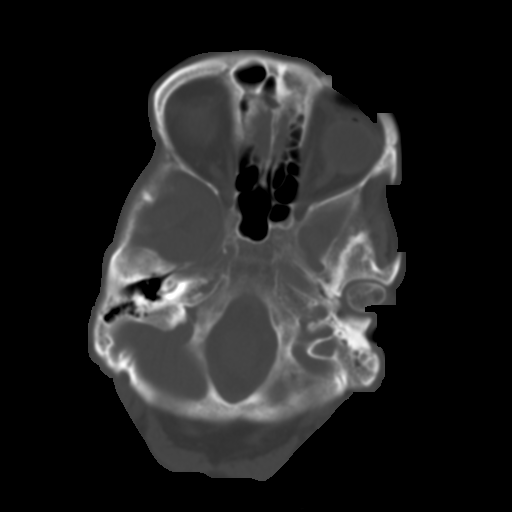
[im 8/30  brain]
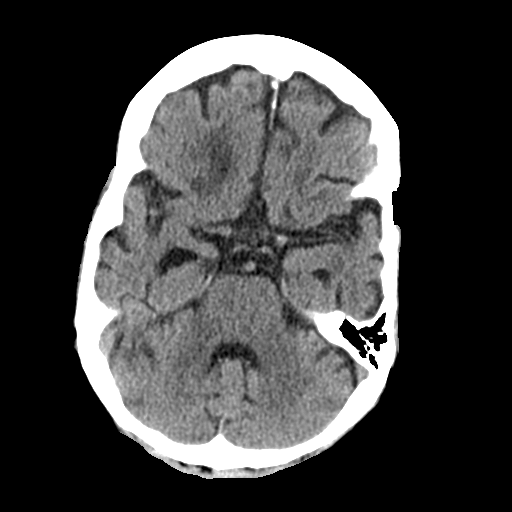
[im 11/30  brain]
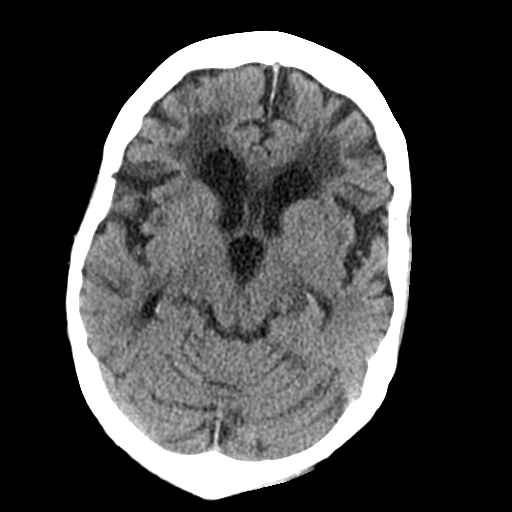
[im 15/30  brain]
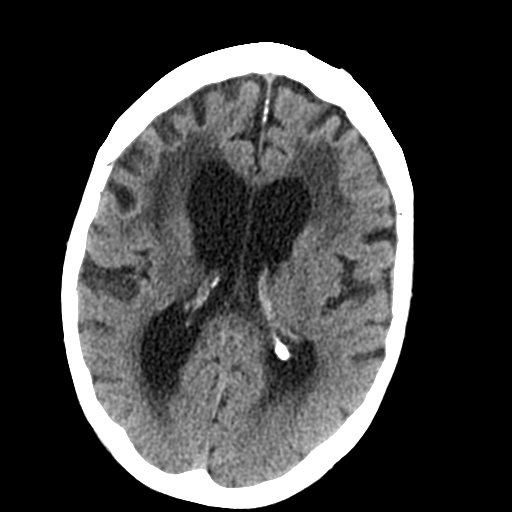
[im 19/30  brain]
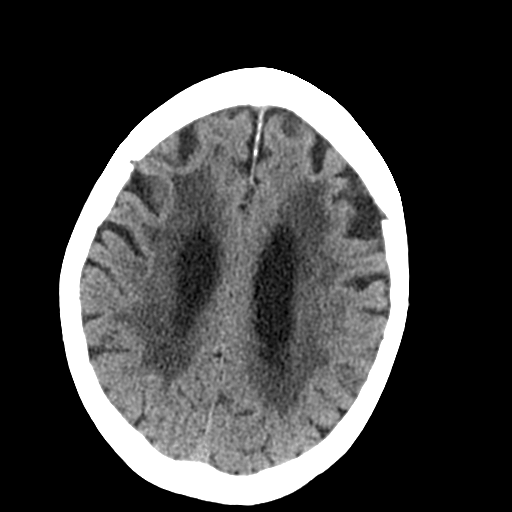
[im 19/30  bone]
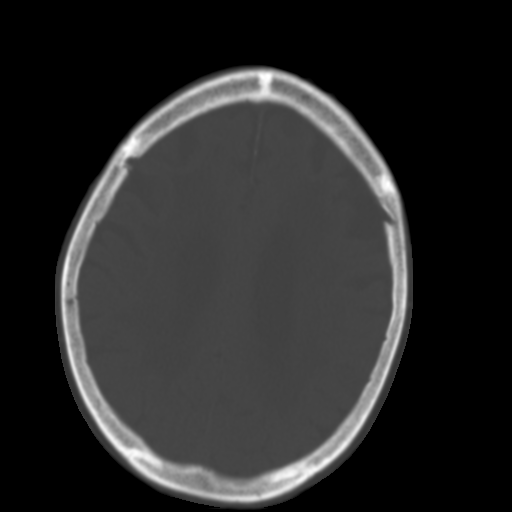
[im 22/30  brain]
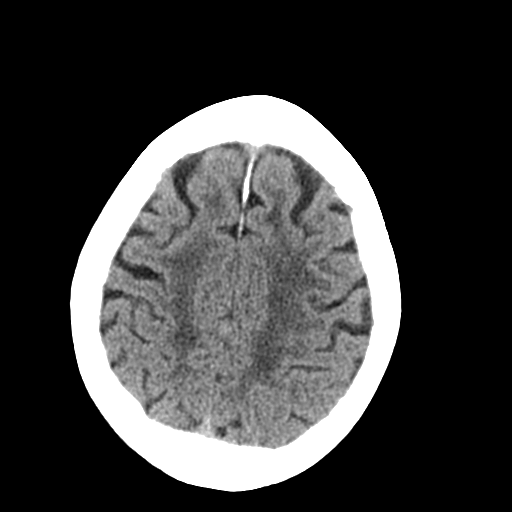
[im 26/30  brain]
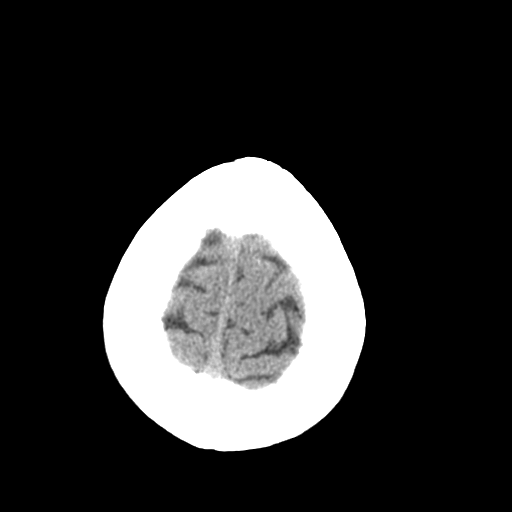

[Series 4: coronal soft tissue · coronal · 0.30mm/px · 3 of 65 slices shown]
[im 22/65  brain]
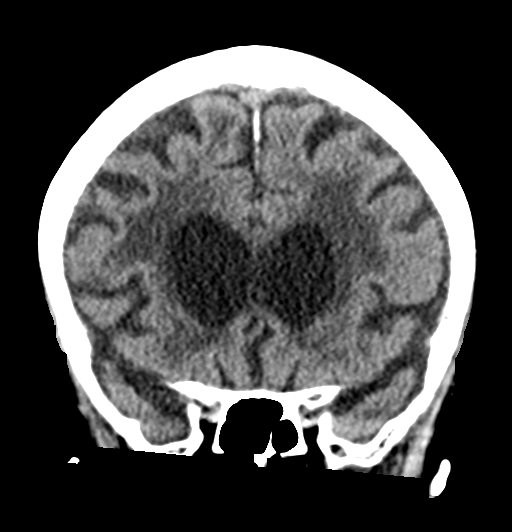
[im 29/65  brain]
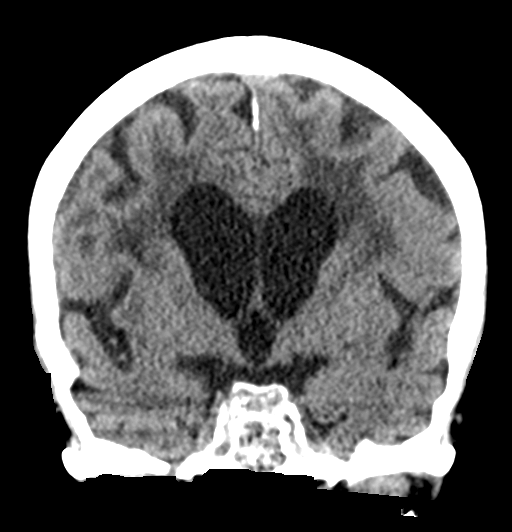
[im 36/65  brain]
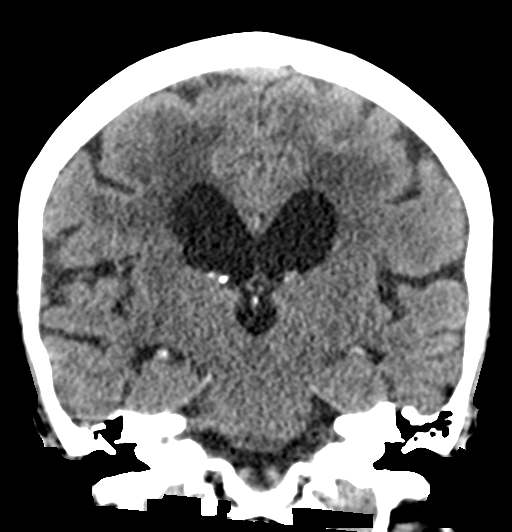

[Series 5: sagittal soft tissue · sagittal · 0.31mm/px · 3 of 51 slices shown]
[im 17/51  brain]
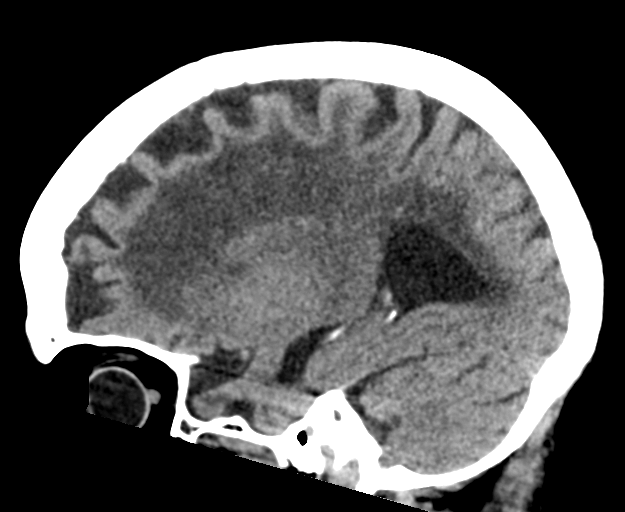
[im 26/51  brain]
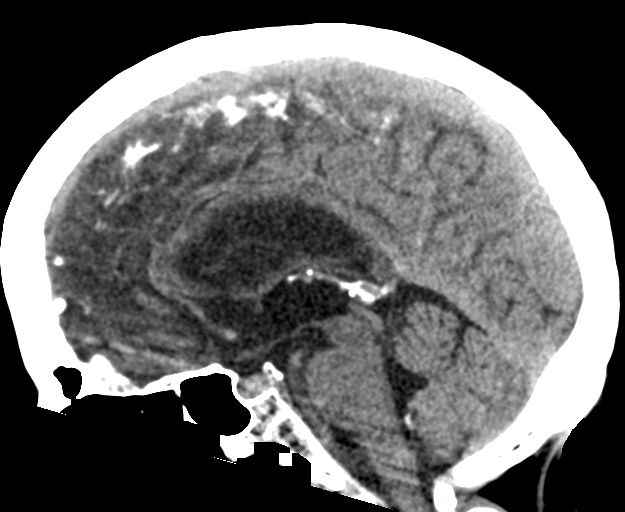
[im 34/51  brain]
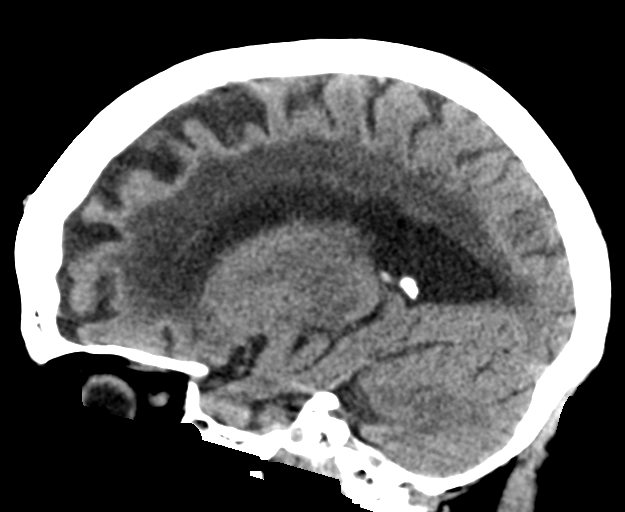

[17 of 47 positions shown; findings below may reference images not displayed]

FINDINGS: Brain: No acute intracranial findings are seen. There are no signs
of bleeding within the cranium. Cortical sulci are prominent. There
is decreased density in the periventricular and subcortical white
matter. There is prominence of third and both lateral ventricles.

Vascular: There are scattered arterial calcifications.

Skull: Unremarkable.

Sinuses/Orbits: Unremarkable.

Other: None.
IMPRESSION: No acute intracranial findings are seen in noncontrast CT brain.
Atrophy. Small-vessel disease.
